# Patient Record
Sex: Male | Born: 1950
Health system: Southern US, Community
[De-identification: ages and names within clinical notes are randomized; demographics above are authoritative.]

## PROBLEM LIST (undated history)

## (undated) DIAGNOSIS — I10 Essential (primary) hypertension: Secondary | ICD-10-CM

## (undated) DIAGNOSIS — H5500 Unspecified nystagmus: Secondary | ICD-10-CM

## (undated) DIAGNOSIS — K08109 Complete loss of teeth, unspecified cause, unspecified class: Secondary | ICD-10-CM

## (undated) DIAGNOSIS — L980 Pyogenic granuloma: Secondary | ICD-10-CM

## (undated) DIAGNOSIS — M549 Dorsalgia, unspecified: Secondary | ICD-10-CM

## (undated) DIAGNOSIS — M542 Cervicalgia: Secondary | ICD-10-CM

## (undated) DIAGNOSIS — S2239XA Fracture of one rib, unspecified side, initial encounter for closed fracture: Secondary | ICD-10-CM

## (undated) DIAGNOSIS — J309 Allergic rhinitis, unspecified: Secondary | ICD-10-CM

## (undated) DIAGNOSIS — F419 Anxiety disorder, unspecified: Secondary | ICD-10-CM

## (undated) DIAGNOSIS — R339 Retention of urine, unspecified: Secondary | ICD-10-CM

## (undated) DIAGNOSIS — Z125 Encounter for screening for malignant neoplasm of prostate: Secondary | ICD-10-CM

## (undated) DIAGNOSIS — M19019 Primary osteoarthritis, unspecified shoulder: Secondary | ICD-10-CM

## (undated) DIAGNOSIS — M1990 Unspecified osteoarthritis, unspecified site: Secondary | ICD-10-CM

## (undated) DIAGNOSIS — Z803 Family history of malignant neoplasm of breast: Secondary | ICD-10-CM

## (undated) DIAGNOSIS — E039 Hypothyroidism, unspecified: Secondary | ICD-10-CM

## (undated) DIAGNOSIS — IMO0002 Reserved for concepts with insufficient information to code with codable children: Secondary | ICD-10-CM

## (undated) DIAGNOSIS — H269 Unspecified cataract: Secondary | ICD-10-CM

## (undated) DIAGNOSIS — I4949 Other premature depolarization: Secondary | ICD-10-CM

## (undated) DIAGNOSIS — N400 Enlarged prostate without lower urinary tract symptoms: Secondary | ICD-10-CM

## (undated) DIAGNOSIS — E785 Hyperlipidemia, unspecified: Secondary | ICD-10-CM

## (undated) DIAGNOSIS — L679 Hair color and hair shaft abnormality, unspecified: Secondary | ICD-10-CM

## (undated) DIAGNOSIS — R350 Frequency of micturition: Secondary | ICD-10-CM

## (undated) DIAGNOSIS — T7840XA Allergy, unspecified, initial encounter: Secondary | ICD-10-CM

## (undated) DIAGNOSIS — D485 Neoplasm of uncertain behavior of skin: Secondary | ICD-10-CM

## (undated) DIAGNOSIS — R000 Tachycardia, unspecified: Secondary | ICD-10-CM

## (undated) DIAGNOSIS — K635 Polyp of colon: Secondary | ICD-10-CM

## (undated) DIAGNOSIS — E1065 Type 1 diabetes mellitus with hyperglycemia: Secondary | ICD-10-CM

## (undated) DIAGNOSIS — Z972 Presence of dental prosthetic device (complete) (partial): Secondary | ICD-10-CM

## (undated) DIAGNOSIS — F329 Major depressive disorder, single episode, unspecified: Secondary | ICD-10-CM

## (undated) DIAGNOSIS — G4700 Insomnia, unspecified: Secondary | ICD-10-CM

## (undated) DIAGNOSIS — K219 Gastro-esophageal reflux disease without esophagitis: Secondary | ICD-10-CM

## (undated) DIAGNOSIS — J449 Chronic obstructive pulmonary disease, unspecified: Secondary | ICD-10-CM

## (undated) DIAGNOSIS — F32A Depression, unspecified: Secondary | ICD-10-CM

## (undated) DIAGNOSIS — Z973 Presence of spectacles and contact lenses: Secondary | ICD-10-CM

## (undated) DIAGNOSIS — J45909 Unspecified asthma, uncomplicated: Secondary | ICD-10-CM

## (undated) DIAGNOSIS — J302 Other seasonal allergic rhinitis: Secondary | ICD-10-CM

## (undated) DIAGNOSIS — Z79899 Other long term (current) drug therapy: Secondary | ICD-10-CM

## (undated) HISTORY — DX: Pyogenic granuloma: L98.0

## (undated) HISTORY — DX: Cervicalgia: M54.2

## (undated) HISTORY — DX: Primary osteoarthritis, unspecified shoulder: M19.019

## (undated) HISTORY — DX: Other premature depolarization: I49.49

## (undated) HISTORY — DX: Family history of malignant neoplasm of breast: Z80.3

## (undated) HISTORY — DX: Other long term (current) drug therapy: Z79.899

## (undated) HISTORY — DX: Fracture of one rib, unspecified side, initial encounter for closed fracture: S22.39XA

## (undated) HISTORY — DX: Tachycardia, unspecified: R00.0

## (undated) HISTORY — DX: Reserved for concepts with insufficient information to code with codable children: IMO0002

## (undated) HISTORY — DX: Insomnia, unspecified: G47.00

## (undated) HISTORY — DX: Gastro-esophageal reflux disease without esophagitis: K21.9

## (undated) HISTORY — DX: Encounter for screening for malignant neoplasm of prostate: Z12.5

## (undated) HISTORY — DX: Anxiety disorder, unspecified: F41.9

## (undated) HISTORY — DX: Benign prostatic hyperplasia without lower urinary tract symptoms: N40.0

## (undated) HISTORY — DX: Unspecified nystagmus: H55.00

## (undated) HISTORY — DX: Neoplasm of uncertain behavior of skin: D48.5

## (undated) HISTORY — DX: Hyperlipidemia, unspecified: E78.5

## (undated) HISTORY — DX: Frequency of micturition: R35.0

## (undated) HISTORY — DX: Allergy, unspecified, initial encounter: T78.40XA

## (undated) HISTORY — DX: Allergic rhinitis, unspecified: J30.9

## (undated) HISTORY — PX: COLONOSCOPY: SHX174

## (undated) HISTORY — PX: OTHER SURGICAL HISTORY: SHX169

## (undated) HISTORY — DX: Retention of urine, unspecified: R33.9

## (undated) HISTORY — DX: Unspecified asthma, uncomplicated: J45.909

## (undated) HISTORY — DX: Other seasonal allergic rhinitis: J30.2

## (undated) HISTORY — DX: Hair color and hair shaft abnormality, unspecified: L67.9

## (undated) HISTORY — DX: Hypothyroidism, unspecified: E03.9

## (undated) HISTORY — DX: Essential (primary) hypertension: I10

## (undated) HISTORY — DX: Dorsalgia, unspecified: M54.9

## (undated) HISTORY — DX: Chronic obstructive pulmonary disease, unspecified: J44.9

## (undated) HISTORY — DX: Major depressive disorder, single episode, unspecified: F32.9

## (undated) HISTORY — DX: Type 1 diabetes mellitus with hyperglycemia: E10.65

## (undated) HISTORY — DX: Unspecified cataract: H26.9

## (undated) HISTORY — PX: ELBOW ARTHRODESIS: SUR51

## (undated) HISTORY — DX: Polyp of colon: K63.5

## (undated) HISTORY — DX: Depression, unspecified: F32.A

## (undated) HISTORY — DX: Unspecified osteoarthritis, unspecified site: M19.90

---

## 1998-04-17 ENCOUNTER — Encounter: Admission: RE | Admit: 1998-04-17 | Discharge: 1998-04-17 | Payer: Self-pay | Admitting: Family Medicine

## 1998-06-11 ENCOUNTER — Encounter: Admission: RE | Admit: 1998-06-11 | Discharge: 1998-06-11 | Payer: Self-pay | Admitting: Family Medicine

## 2000-05-24 ENCOUNTER — Encounter: Admission: RE | Admit: 2000-05-24 | Discharge: 2000-07-05 | Payer: Self-pay | Admitting: Family Medicine

## 2001-02-08 ENCOUNTER — Encounter: Admission: RE | Admit: 2001-02-08 | Discharge: 2001-04-06 | Payer: Self-pay | Admitting: Internal Medicine

## 2002-11-08 ENCOUNTER — Encounter: Admission: RE | Admit: 2002-11-08 | Discharge: 2002-11-08 | Payer: Self-pay | Admitting: *Deleted

## 2003-12-20 ENCOUNTER — Emergency Department (HOSPITAL_COMMUNITY): Admission: EM | Admit: 2003-12-20 | Discharge: 2003-12-20 | Payer: Self-pay | Admitting: Emergency Medicine

## 2005-06-10 ENCOUNTER — Ambulatory Visit: Payer: Self-pay

## 2005-09-07 HISTORY — PX: OTHER SURGICAL HISTORY: SHX169

## 2005-09-07 HISTORY — PX: CERVICAL FUSION: SHX112

## 2005-09-11 ENCOUNTER — Encounter: Admission: RE | Admit: 2005-09-11 | Discharge: 2005-09-11 | Payer: Self-pay | Admitting: Neurological Surgery

## 2005-11-06 ENCOUNTER — Ambulatory Visit (HOSPITAL_COMMUNITY): Admission: RE | Admit: 2005-11-06 | Discharge: 2005-11-07 | Payer: Self-pay | Admitting: Neurological Surgery

## 2005-12-01 ENCOUNTER — Encounter: Admission: RE | Admit: 2005-12-01 | Discharge: 2005-12-01 | Payer: Self-pay | Admitting: Neurological Surgery

## 2006-01-05 ENCOUNTER — Encounter: Admission: RE | Admit: 2006-01-05 | Discharge: 2006-01-05 | Payer: Self-pay | Admitting: Neurological Surgery

## 2006-01-15 ENCOUNTER — Ambulatory Visit (HOSPITAL_BASED_OUTPATIENT_CLINIC_OR_DEPARTMENT_OTHER): Admission: RE | Admit: 2006-01-15 | Discharge: 2006-01-15 | Payer: Self-pay | Admitting: Orthopedic Surgery

## 2006-03-05 ENCOUNTER — Ambulatory Visit (HOSPITAL_BASED_OUTPATIENT_CLINIC_OR_DEPARTMENT_OTHER): Admission: RE | Admit: 2006-03-05 | Discharge: 2006-03-05 | Payer: Self-pay | Admitting: Orthopedic Surgery

## 2006-05-17 ENCOUNTER — Encounter: Admission: RE | Admit: 2006-05-17 | Discharge: 2006-05-17 | Payer: Self-pay | Admitting: Neurological Surgery

## 2011-09-30 ENCOUNTER — Other Ambulatory Visit: Payer: Self-pay | Admitting: Internal Medicine

## 2011-09-30 ENCOUNTER — Ambulatory Visit
Admission: RE | Admit: 2011-09-30 | Discharge: 2011-09-30 | Disposition: A | Discharge status: Home / Self Care | Payer: 59 | Source: Ambulatory Visit | Attending: Internal Medicine | Admitting: Internal Medicine

## 2011-09-30 DIAGNOSIS — M549 Dorsalgia, unspecified: Secondary | ICD-10-CM

## 2011-10-28 ENCOUNTER — Ambulatory Visit (AMBULATORY_SURGERY_CENTER): Payer: 59 | Admitting: *Deleted

## 2011-10-28 ENCOUNTER — Encounter: Payer: Self-pay | Admitting: Gastroenterology

## 2011-10-28 VITALS — Ht 67.0 in | Wt 171.4 lb

## 2011-10-28 DIAGNOSIS — Z1211 Encounter for screening for malignant neoplasm of colon: Secondary | ICD-10-CM

## 2011-10-28 MED ORDER — MOVIPREP 100 G PO SOLR: ORAL | Status: DC

## 2011-11-11 ENCOUNTER — Encounter: Payer: Self-pay | Admitting: Gastroenterology

## 2011-11-11 ENCOUNTER — Ambulatory Visit (AMBULATORY_SURGERY_CENTER): Payer: 59 | Admitting: Gastroenterology

## 2011-11-11 VITALS — BP 129/74 | HR 68 | Temp 96.5°F | Resp 18 | Ht 67.0 in | Wt 171.0 lb

## 2011-11-11 DIAGNOSIS — Z1211 Encounter for screening for malignant neoplasm of colon: Secondary | ICD-10-CM

## 2011-11-11 DIAGNOSIS — D126 Benign neoplasm of colon, unspecified: Secondary | ICD-10-CM

## 2011-11-11 LAB — GLUCOSE, CAPILLARY
Glucose-Capillary: 130 mg/dL — ABNORMAL HIGH (ref 70–99)
Glucose-Capillary: 123 mg/dL — ABNORMAL HIGH (ref 70–99)

## 2011-11-11 MED ORDER — SODIUM CHLORIDE 0.9 % IV SOLN: 500.0000 mL | INTRAVENOUS | Status: DC

## 2011-11-11 NOTE — Op Note (Signed)
[facility] [address] [place], [place]  [address]  COLONOSCOPY PROCEDURE REPORT  PATIENT:  Matthew Dominguez, Matthew Dominguez  MR#:  [id number] BIRTHDATE:  May 11, 1951, 60 yrs. old  GENDER:  male ENDOSCOPIST:  Rachael Fee, MD REF. BY:  Bufford Spikes, M.D. PROCEDURE DATE:  11/11/2011 PROCEDURE:  Colonoscopy with snare polypectomy ASA CLASS:  Class II INDICATIONS:  Routine Risk Screening MEDICATIONS:   Fentanyl 100 mcg IV, These medications were titrated to patient response per physician's verbal order, Versed 8 mg IV  DESCRIPTION OF PROCEDURE:   After the risks benefits and alternatives of the procedure were thoroughly explained, informed consent was obtained.  Digital rectal exam was performed and revealed no rectal masses.   The LB CF-[id number] endoscope was introduced through the anus and advanced to the cecum, which was identified by both the appendix and ileocecal valve, without limitations.  The quality of the prep was good..  The instrument was then slowly withdrawn as the colon was fully examined. <<PROCEDUREIMAGES>> FINDINGS:  Ten sessile polyps were found, removed and all were sent to pathology (jar 1). The largest was 1cm across, located in cecum, removed with snare/cautery. The rest were 2-29mm across, located in cecum, ascending, transverse, descending and sigmoid segments, all were removed with cold snare (see image3 and image2).  This was otherwise a normal examination of the colon (see image5, image4, and image1).   Retroflexed views in the rectum revealed no abnormalities. COMPLICATIONS:  None  ENDOSCOPIC IMPRESSION: 1) Ten polyps, all were removed and all were sent to pathology 2) Otherwise normal examination  RECOMMENDATIONS: 1) If the polyp(s) removed today are proven to be adenomatous (pre-cancerous) polyps, you will need a colonoscopy in 3 years. Otherwise you should continue to follow colorectal cancer screening guidelines for "routine risk" patients  with a colonoscopy in 10 years. You will receive a letter within 1-2 weeks with the results of your biopsy as well as final recommendations. Please call my office if you have not received a letter after 3 weeks.  ______________________________ Rachael Fee, MD  n. eSIGNED:   Rachael Fee at 11/11/2011 09:15 AM  Posey Rea, [id number]

## 2011-11-11 NOTE — Patient Instructions (Signed)
YOU HAD AN ENDOSCOPIC PROCEDURE TODAY AT THE [facility]: Refer to the procedure report that was given to you for any specific questions about what was found during the examination.  If the procedure report does not answer your questions, please call your gastroenterologist to clarify.  If you requested that your care partner not be given the details of your procedure findings, then the procedure report has been included in a sealed envelope for you to review at your convenience later.  YOU SHOULD EXPECT: Some feelings of bloating in the abdomen. Passage of more gas than usual.  Walking can help get rid of the air that was put into your GI tract during the procedure and reduce the bloating. If you had a lower endoscopy (such as a colonoscopy or flexible sigmoidoscopy) you may notice spotting of blood in your stool or on the toilet paper. If you underwent a bowel prep for your procedure, then you may not have a normal bowel movement for a few days.  DIET: Your first meal following the procedure should be a light meal and then it is ok to progress to your normal diet.  A half-sandwich or bowl of soup is an example of a good first meal.  Heavy or fried foods are harder to digest and may make you feel nauseous or bloated.  Likewise meals heavy in dairy and vegetables can cause extra gas to form and this can also increase the bloating.  Drink plenty of fluids but you should avoid alcoholic beverages for 24 hours.  ACTIVITY: Your care partner should take you home directly after the procedure.  You should plan to take it easy, moving slowly for the rest of the day.  You can resume normal activity the day after the procedure however you should NOT DRIVE or use heavy machinery for 24 hours (because of the sedation medicines used during the test).    SYMPTOMS TO REPORT IMMEDIATELY: A gastroenterologist can be reached at any hour.  During normal business hours, 8:30 AM to 5:00 PM Monday through Friday,  call [phone].  After hours and on weekends, please call the GI answering service at [phone] who will take a message and have the physician on call contact you.   Following lower endoscopy (colonoscopy or flexible sigmoidoscopy):  Excessive amounts of blood in the stool  Significant tenderness or worsening of abdominal pains  Swelling of the abdomen that is new, acute  Fever of 100F or higher    FOLLOW UP: If any biopsies were taken you will be contacted by phone or by letter within the next 1-3 weeks.  Call your gastroenterologist if you have not heard about the biopsies in 3 weeks.  Our staff will call the home number listed on your records the next business day following your procedure to check on you and address any questions or concerns that you may have at that time regarding the information given to you following your procedure. This is a courtesy call and so if there is no answer at the home number and we have not heard from you through the emergency physician on call, we will assume that you have returned to your regular daily activities without incident.  SIGNATURES/CONFIDENTIALITY: You and/or your care partner have signed paperwork which will be entered into your electronic medical record.  These signatures attest to the fact that that the information above on your After Visit Summary has been reviewed and is understood.  Full responsibility of the confidentiality   of this discharge information lies with you and/or your care-partner.     

## 2011-11-11 NOTE — Progress Notes (Signed)
Patient did not experience any of the following events: a burn prior to discharge; a fall within the facility; wrong site/side/patient/procedure/implant event; or a hospital transfer or hospital admission upon discharge from the facility. (G8907) Patient did not have preoperative order for IV antibiotic SSI prophylaxis. (G8918)  

## 2011-11-12 ENCOUNTER — Telehealth: Payer: Self-pay | Admitting: *Deleted

## 2011-11-12 NOTE — Telephone Encounter (Signed)
  Follow up Call-  Call back number 11/11/2011  Post procedure Call Back phone  # [phone]  Permission to leave phone message Yes     Patient questions:  Do you have a fever, pain , or abdominal swelling? no Pain Score  0 *  Have you tolerated food without any problems? yes  Have you been able to return to your normal activities? yes  Do you have any questions about your discharge instructions: Diet   no Medications  no Follow up visit  no  Do you have questions or concerns about your Care? no  Actions: * If pain score is 4 or above: No action needed, pain <4.

## 2011-11-17 ENCOUNTER — Encounter: Payer: Self-pay | Admitting: Gastroenterology

## 2012-10-05 ENCOUNTER — Ambulatory Visit
Admission: RE | Admit: 2012-10-05 | Discharge: 2012-10-05 | Disposition: A | Discharge status: Home / Self Care | Payer: 59 | Source: Ambulatory Visit | Attending: Nurse Practitioner | Admitting: Nurse Practitioner

## 2012-10-05 ENCOUNTER — Other Ambulatory Visit: Payer: Self-pay | Admitting: Nurse Practitioner

## 2012-10-05 DIAGNOSIS — R000 Tachycardia, unspecified: Secondary | ICD-10-CM

## 2012-11-16 ENCOUNTER — Other Ambulatory Visit: Payer: Self-pay | Admitting: Orthopedic Surgery

## 2012-11-16 ENCOUNTER — Encounter (HOSPITAL_BASED_OUTPATIENT_CLINIC_OR_DEPARTMENT_OTHER): Payer: Self-pay | Admitting: *Deleted

## 2012-11-16 NOTE — Progress Notes (Signed)
Pt goes to [facility]-had bronchitis 1/14-put on advair-labs and cxr done will call office for notes-told him to take his meds as rx-bring all meds and inhalers and overnight bag

## 2012-11-22 ENCOUNTER — Encounter (HOSPITAL_BASED_OUTPATIENT_CLINIC_OR_DEPARTMENT_OTHER): Payer: Self-pay | Admitting: Certified Registered Nurse Anesthetist

## 2012-11-22 ENCOUNTER — Ambulatory Visit (HOSPITAL_BASED_OUTPATIENT_CLINIC_OR_DEPARTMENT_OTHER): Payer: 59 | Admitting: Certified Registered Nurse Anesthetist

## 2012-11-22 ENCOUNTER — Encounter (HOSPITAL_BASED_OUTPATIENT_CLINIC_OR_DEPARTMENT_OTHER)
Admission: RE | Disposition: A | Discharge status: Home / Self Care | Payer: Self-pay | Source: Ambulatory Visit | Attending: Orthopedic Surgery

## 2012-11-22 ENCOUNTER — Encounter (HOSPITAL_BASED_OUTPATIENT_CLINIC_OR_DEPARTMENT_OTHER): Payer: Self-pay | Admitting: *Deleted

## 2012-11-22 ENCOUNTER — Ambulatory Visit (HOSPITAL_BASED_OUTPATIENT_CLINIC_OR_DEPARTMENT_OTHER)
Admission: RE | Admit: 2012-11-22 | Discharge: 2012-11-22 | Disposition: A | Discharge status: Home / Self Care | Payer: 59 | Source: Ambulatory Visit | Attending: Orthopedic Surgery | Admitting: Orthopedic Surgery

## 2012-11-22 DIAGNOSIS — J449 Chronic obstructive pulmonary disease, unspecified: Secondary | ICD-10-CM | POA: Insufficient documentation

## 2012-11-22 DIAGNOSIS — J4489 Other specified chronic obstructive pulmonary disease: Secondary | ICD-10-CM | POA: Insufficient documentation

## 2012-11-22 DIAGNOSIS — I10 Essential (primary) hypertension: Secondary | ICD-10-CM | POA: Insufficient documentation

## 2012-11-22 DIAGNOSIS — Z9889 Other specified postprocedural states: Secondary | ICD-10-CM

## 2012-11-22 DIAGNOSIS — Z7982 Long term (current) use of aspirin: Secondary | ICD-10-CM | POA: Insufficient documentation

## 2012-11-22 DIAGNOSIS — M719 Bursopathy, unspecified: Secondary | ICD-10-CM | POA: Insufficient documentation

## 2012-11-22 DIAGNOSIS — M19019 Primary osteoarthritis, unspecified shoulder: Secondary | ICD-10-CM | POA: Insufficient documentation

## 2012-11-22 DIAGNOSIS — E119 Type 2 diabetes mellitus without complications: Secondary | ICD-10-CM | POA: Insufficient documentation

## 2012-11-22 DIAGNOSIS — M67919 Unspecified disorder of synovium and tendon, unspecified shoulder: Secondary | ICD-10-CM | POA: Insufficient documentation

## 2012-11-22 DIAGNOSIS — Z79899 Other long term (current) drug therapy: Secondary | ICD-10-CM | POA: Insufficient documentation

## 2012-11-22 DIAGNOSIS — M25819 Other specified joint disorders, unspecified shoulder: Secondary | ICD-10-CM | POA: Insufficient documentation

## 2012-11-22 HISTORY — PX: SHOULDER ARTHROSCOPY WITH ROTATOR CUFF REPAIR AND SUBACROMIAL DECOMPRESSION: SHX5686

## 2012-11-22 HISTORY — DX: Complete loss of teeth, unspecified cause, unspecified class: Z97.2

## 2012-11-22 HISTORY — DX: Complete loss of teeth, unspecified cause, unspecified class: K08.109

## 2012-11-22 HISTORY — DX: Presence of spectacles and contact lenses: Z97.3

## 2012-11-22 LAB — POCT I-STAT, CHEM 8
BUN: 14 mg/dL (ref 6–23)
Creatinine, Ser: 0.8 mg/dL (ref 0.50–1.35)
Glucose, Bld: 121 mg/dL — ABNORMAL HIGH (ref 70–99)
Hemoglobin: 16.3 g/dL (ref 13.0–17.0)
TCO2: 28 mmol/L (ref 0–100)

## 2012-11-22 LAB — GLUCOSE, CAPILLARY: Glucose-Capillary: 100 mg/dL — ABNORMAL HIGH (ref 70–99)

## 2012-11-22 SURGERY — SHOULDER ARTHROSCOPY WITH ROTATOR CUFF REPAIR AND SUBACROMIAL DECOMPRESSION
Anesthesia: Regional | Site: Shoulder | Laterality: Right | Wound class: Clean

## 2012-11-22 MED ORDER — ONDANSETRON HCL 4 MG/2ML IJ SOLN
INTRAMUSCULAR | Status: DC | PRN
  Administered 2012-11-22: 4 mg via INTRAVENOUS

## 2012-11-22 MED ORDER — PROPOFOL 10 MG/ML IV BOLUS
INTRAVENOUS | Status: DC | PRN
  Administered 2012-11-22: 100 mg via INTRAVENOUS
  Administered 2012-11-22: 10 mg via INTRAVENOUS

## 2012-11-22 MED ORDER — LACTATED RINGERS IV SOLN
INTRAVENOUS | Status: DC
  Administered 2012-11-22 (×2): via INTRAVENOUS

## 2012-11-22 MED ORDER — SODIUM CHLORIDE 0.9 % IR SOLN
Status: DC | PRN
  Administered 2012-11-22: 6200 mL

## 2012-11-22 MED ORDER — BUPIVACAINE HCL (PF) 0.25 % IJ SOLN
INTRAMUSCULAR | Status: DC | PRN
  Administered 2012-11-22: 30 mL

## 2012-11-22 MED ORDER — SUCCINYLCHOLINE CHLORIDE 20 MG/ML IJ SOLN
INTRAMUSCULAR | Status: DC | PRN
  Administered 2012-11-22: 100 mg via INTRAVENOUS

## 2012-11-22 MED ORDER — OXYCODONE HCL 5 MG/5ML PO SOLN: 5.0000 mg | Freq: Once | ORAL | Status: DC | PRN

## 2012-11-22 MED ORDER — FENTANYL CITRATE 0.05 MG/ML IJ SOLN
50.0000 ug | INTRAMUSCULAR | Status: DC | PRN
  Administered 2012-11-22: 50 ug via INTRAVENOUS

## 2012-11-22 MED ORDER — EPHEDRINE SULFATE 50 MG/ML IJ SOLN
INTRAMUSCULAR | Status: DC | PRN
  Administered 2012-11-22: 5 mg via INTRAVENOUS
  Administered 2012-11-22 (×2): 10 mg via INTRAVENOUS

## 2012-11-22 MED ORDER — FENTANYL CITRATE 0.05 MG/ML IJ SOLN
INTRAMUSCULAR | Status: DC | PRN
  Administered 2012-11-22: 25 ug via INTRAVENOUS

## 2012-11-22 MED ORDER — OXYCODONE HCL 5 MG PO TABS: 5.0000 mg | ORAL_TABLET | Freq: Once | ORAL | Status: DC | PRN

## 2012-11-22 MED ORDER — CEFAZOLIN SODIUM-DEXTROSE 2-3 GM-% IV SOLR
INTRAVENOUS | Status: DC | PRN
  Administered 2012-11-22: 2 g via INTRAVENOUS

## 2012-11-22 MED ORDER — DEXAMETHASONE SODIUM PHOSPHATE 4 MG/ML IJ SOLN
INTRAMUSCULAR | Status: DC | PRN
  Administered 2012-11-22: 5 mg via INTRAVENOUS

## 2012-11-22 MED ORDER — HYDROMORPHONE HCL PF 1 MG/ML IJ SOLN: 0.2500 mg | INTRAMUSCULAR | Status: DC | PRN

## 2012-11-22 MED ORDER — LIDOCAINE HCL (CARDIAC) 20 MG/ML IV SOLN
INTRAVENOUS | Status: DC | PRN
  Administered 2012-11-22: 30 mg via INTRAVENOUS

## 2012-11-22 MED ORDER — MIDAZOLAM HCL 2 MG/2ML IJ SOLN
1.0000 mg | INTRAMUSCULAR | Status: DC | PRN
  Administered 2012-11-22: 1 mg via INTRAVENOUS

## 2012-11-22 MED ORDER — OXYCODONE-ACETAMINOPHEN 5-325 MG PO TABS: 1.0000 | ORAL_TABLET | ORAL | Status: DC | PRN

## 2012-11-22 SURGICAL SUPPLY — 81 items
ANCHOR CORKSCREW FIBER 5.5X15 (Anchor) ×2 IMPLANT
BENZOIN TINCTURE PRP APPL 2/3 (GAUZE/BANDAGES/DRESSINGS) IMPLANT
BLADE SURG 15 STRL LF DISP TIS (BLADE) IMPLANT
BLADE SURG 15 STRL SS (BLADE)
BLADE SURG ROTATE 9660 (MISCELLANEOUS) IMPLANT
BUR OVAL 4.0 (BURR) ×2 IMPLANT
CANISTER OMNI JUG 16 LITER (MISCELLANEOUS) ×2 IMPLANT
CANISTER SUCTION 2500CC (MISCELLANEOUS) IMPLANT
CANNULA 5.75X71 LONG (CANNULA) ×2 IMPLANT
CANNULA TWIST IN 8.25X7CM (CANNULA) IMPLANT
CHLORAPREP W/TINT 26ML (MISCELLANEOUS) ×2 IMPLANT
CLOTH BEACON ORANGE TIMEOUT ST (SAFETY) ×2 IMPLANT
DECANTER SPIKE VIAL GLASS SM (MISCELLANEOUS) IMPLANT
DERMABOND ADVANCED (GAUZE/BANDAGES/DRESSINGS)
DERMABOND ADVANCED .7 DNX12 (GAUZE/BANDAGES/DRESSINGS) IMPLANT
DRAPE INCISE IOBAN 66X45 STRL (DRAPES) ×2 IMPLANT
DRAPE STERI 35X30 U-POUCH (DRAPES) ×2 IMPLANT
DRAPE SURG 17X23 STRL (DRAPES) ×2 IMPLANT
DRAPE U 20/CS (DRAPES) ×2 IMPLANT
DRAPE U-SHAPE 47X51 STRL (DRAPES) ×2 IMPLANT
DRAPE U-SHAPE 76X120 STRL (DRAPES) ×4 IMPLANT
DRSG PAD ABDOMINAL 8X10 ST (GAUZE/BANDAGES/DRESSINGS) ×2 IMPLANT
ELECT REM PT RETURN 9FT ADLT (ELECTROSURGICAL)
ELECTRODE REM PT RTRN 9FT ADLT (ELECTROSURGICAL) IMPLANT
GAUZE SPONGE 4X4 16PLY XRAY LF (GAUZE/BANDAGES/DRESSINGS) IMPLANT
GAUZE XEROFORM 1X8 LF (GAUZE/BANDAGES/DRESSINGS) ×2 IMPLANT
GLOVE BIO SURGEON STRL SZ 6.5 (GLOVE) ×6 IMPLANT
GLOVE BIO SURGEON STRL SZ7 (GLOVE) ×2 IMPLANT
GLOVE BIO SURGEON STRL SZ7.5 (GLOVE) ×2 IMPLANT
GLOVE BIOGEL PI IND STRL 7.0 (GLOVE) ×4 IMPLANT
GLOVE BIOGEL PI IND STRL 8 (GLOVE) ×1 IMPLANT
GLOVE BIOGEL PI INDICATOR 7.0 (GLOVE) ×4
GLOVE BIOGEL PI INDICATOR 8 (GLOVE) ×1
GLOVE EXAM NITRILE EXT CUFF MD (GLOVE) ×2 IMPLANT
GOWN BRE IMP PREV XXLGXLNG (GOWN DISPOSABLE) ×2 IMPLANT
GOWN PREVENTION PLUS XLARGE (GOWN DISPOSABLE) ×8 IMPLANT
IMMOBILIZER SHOULDER XLGE (ORTHOPEDIC SUPPLIES) ×2 IMPLANT
IV NS IRRIG 3000ML ARTHROMATIC (IV SOLUTION) ×4 IMPLANT
NDL SUT 6 .5 CRC .975X.05 MAYO (NEEDLE) IMPLANT
NEEDLE 1/2 CIR CATGUT .05X1.09 (NEEDLE) IMPLANT
NEEDLE MAYO TAPER (NEEDLE)
NEEDLE SCORPION MULTI FIRE (NEEDLE) ×2 IMPLANT
NS IRRIG 1000ML POUR BTL (IV SOLUTION) IMPLANT
PACK ARTHROSCOPY DSU (CUSTOM PROCEDURE TRAY) ×2 IMPLANT
PACK BASIN DAY SURGERY FS (CUSTOM PROCEDURE TRAY) ×2 IMPLANT
PENCIL BUTTON HOLSTER BLD 10FT (ELECTRODE) IMPLANT
PUSHLOCK PEEK 4.5X24 (Orthopedic Implant) ×2 IMPLANT
RESECTOR FULL RADIUS 4.2MM (BLADE) ×2 IMPLANT
SLEEVE SCD COMPRESS KNEE MED (MISCELLANEOUS) ×2 IMPLANT
SLING ARM FOAM STRAP LRG (SOFTGOODS) IMPLANT
SLING ARM FOAM STRAP MED (SOFTGOODS) IMPLANT
SLING ARM FOAM STRAP XLG (SOFTGOODS) IMPLANT
SLING ARM IMMOBILIZER MED (SOFTGOODS) IMPLANT
SPONGE GAUZE 4X4 12PLY (GAUZE/BANDAGES/DRESSINGS) ×2 IMPLANT
SPONGE LAP 4X18 X RAY DECT (DISPOSABLE) IMPLANT
STRIP CLOSURE SKIN 1/2X4 (GAUZE/BANDAGES/DRESSINGS) IMPLANT
SUCTION FRAZIER TIP 10 FR DISP (SUCTIONS) IMPLANT
SUPPORT WRAP ARM LG (MISCELLANEOUS) IMPLANT
SUT BONE WAX W31G (SUTURE) IMPLANT
SUT ETHIBOND 2 OS 4 DA (SUTURE) IMPLANT
SUT ETHILON 3 0 PS 1 (SUTURE) ×2 IMPLANT
SUT ETHILON 4 0 PS 2 18 (SUTURE) IMPLANT
SUT FIBERWIRE #2 38 T-5 BLUE (SUTURE)
SUT FIBERWIRE 2-0 18 17.9 3/8 (SUTURE)
SUT MNCRL AB 3-0 PS2 18 (SUTURE) IMPLANT
SUT MNCRL AB 4-0 PS2 18 (SUTURE) IMPLANT
SUT PDS AB 0 CT 36 (SUTURE) IMPLANT
SUT PROLENE 3 0 PS 2 (SUTURE) IMPLANT
SUT VIC AB 0 CT1 18XCR BRD 8 (SUTURE) IMPLANT
SUT VIC AB 0 CT1 8-18 (SUTURE)
SUT VIC AB 2-0 SH 18 (SUTURE) IMPLANT
SUTURE FIBERWR #2 38 T-5 BLUE (SUTURE) IMPLANT
SUTURE FIBERWR 2-0 18 17.9 3/8 (SUTURE) IMPLANT
SYR BULB 3OZ (MISCELLANEOUS) IMPLANT
TOWEL OR 17X24 6PK STRL BLUE (TOWEL DISPOSABLE) ×2 IMPLANT
TOWEL OR NON WOVEN STRL DISP B (DISPOSABLE) ×2 IMPLANT
TUBE CONNECTING 20X1/4 (TUBING) ×2 IMPLANT
TUBING ARTHROSCOPY IRRIG 16FT (MISCELLANEOUS) ×2 IMPLANT
WAND STAR VAC 90 (SURGICAL WAND) ×2 IMPLANT
WATER STERILE IRR 1000ML POUR (IV SOLUTION) ×4 IMPLANT
YANKAUER SUCT BULB TIP NO VENT (SUCTIONS) IMPLANT

## 2012-11-22 NOTE — H&P (Signed)
Matthew Dominguez is an 62 y.o. male.   Chief Complaint: R shoulder pain HPI: R shoulder pain x several years, failed conservative treatment of activity modification and injections.  Past Medical History  Diagnosis Date  . Allergy   . Seasonal allergies   . Anxiety   . Arthritis   . Asthma   . Depression   . Diabetes mellitus   . Hypertension   . COPD (chronic obstructive pulmonary disease)   . Wears glasses   . Full dentures     Past Surgical History  Procedure Laterality Date  . Totator cuff repair  2007    left  . Cervical fusion  2007  . Elbow arthrodesis      right as child  . Colonoscopy      Family History  Problem Relation Age of Onset  . Diabetes Sister   . Heart disease Brother   . Pancreatic cancer Maternal Aunt   . Diabetes Sister   . Diabetes Sister   . Breast cancer Sister   . Heart disease Brother   . Diabetes Brother   . Colon polyps Brother   . Diabetes Brother   . Diabetes Brother   . Diabetes Brother   . Diabetes Brother   . Diabetes Brother    Social History:  reports that he quit smoking about 9 years ago. His smokeless tobacco use includes Snuff. He reports that he does not drink alcohol or use illicit drugs.  Allergies: No Known Allergies  Medications Prior to Admission  Medication Sig Dispense Refill  . albuterol (PROVENTIL HFA;VENTOLIN HFA) 108 (90 BASE) MCG/ACT inhaler Inhale 2 puffs into the lungs every 6 (six) hours as needed for wheezing.      Marland Kitchen aspirin EC 81 MG tablet Take 81 mg by mouth daily.      Marland Kitchen b complex vitamins capsule Take 1 capsule by mouth daily.      . benazepril (LOTENSIN) 10 MG tablet Take 10 mg by mouth daily.      . DiphenhydrAMINE HCl (BENADRYL PO) Take 1 mg by mouth daily.      . Fluticasone-Salmeterol (ADVAIR) 100-50 MCG/DOSE AEPB Inhale 1 puff into the lungs every 12 (twelve) hours.      . gabapentin (NEURONTIN) 300 MG capsule Take 300 mg by mouth 3 (three) times daily.      . insulin glargine (LANTUS) 100  UNIT/ML injection Inject 34 Units into the skin at bedtime.       Marland Kitchen LORazepam (ATIVAN) 1 MG tablet Take 1 mg by mouth 3 (three) times daily.      . metformin (FORTAMET) 1000 MG (OSM) 24 hr tablet Take 1,000 mg by mouth 2 (two) times daily with a meal.       . oxybutynin (DITROPAN-XL) 5 MG 24 hr tablet Take 5 mg by mouth daily.      Marland Kitchen zolpidem (AMBIEN) 10 MG tablet Take 10 mg by mouth at bedtime.      Marland Kitchen loperamide (IMODIUM A-D) 2 MG tablet Take 2 mg by mouth 4 (four) times daily as needed.      Marland Kitchen MOVIPREP 100 G SOLR movi prep as directed  1 each  0  . Multiple Vitamins-Minerals (MULTIVITAMIN WITH MINERALS) tablet Take 1 tablet by mouth daily.        Results for orders placed during the hospital encounter of 11/22/12 (from the past 48 hour(s))  GLUCOSE, CAPILLARY     Status: Abnormal   Collection Time    11/22/12  9:25 AM      Result Value Range   Glucose-Capillary 108 (*) 70 - 99 mg/dL  POCT I-STAT, CHEM 8     Status: Abnormal   Collection Time    11/22/12  9:56 AM      Result Value Range   Sodium 142  135 - 145 mEq/L   Potassium 4.1  3.5 - 5.1 mEq/L   Chloride 106  96 - 112 mEq/L   BUN 14  6 - 23 mg/dL   Creatinine, Ser 4.09  0.50 - 1.35 mg/dL   Glucose, Bld 811 (*) 70 - 99 mg/dL   Calcium, Ion 9.14 (*) 1.13 - 1.30 mmol/L   TCO2 28  0 - 100 mmol/L   Hemoglobin 16.3  13.0 - 17.0 g/dL   HCT 78.2  95.6 - 21.3 %   No results found.  Review of Systems  All other systems reviewed and are negative.    Blood pressure 138/86, pulse 88, temperature 97.9 F (36.6 C), temperature source Oral, resp. rate 18, height 5\' 7"  (1.702 m), weight 78.109 kg (172 lb 3.2 oz), SpO2 97.00%. Physical Exam  Constitutional: He is oriented to person, place, and time. He appears well-developed and well-nourished.  HENT:  Head: Atraumatic.  Eyes: EOM are normal.  Cardiovascular: Intact distal pulses.   Respiratory: Effort normal.  Musculoskeletal:       Right shoulder: He exhibits tenderness, pain  and decreased strength. He exhibits no swelling.  Neurological: He is alert and oriented to person, place, and time.  Skin: Skin is warm and dry.  Psychiatric: He has a normal mood and affect.     Assessment/Plan R shoulder chronic impingement, small RCT, AC DJD Plan R arth SAD/DCR/Debridement versus repair of small RCT. Risks / benefits of surgery discussed Consent on chart  NPO for OR Preop antibiotics   Bryant Saye WILLIAM 11/22/2012, 10:06 AM

## 2012-11-22 NOTE — Transfer of Care (Signed)
Immediate Anesthesia Transfer of Care Note  Patient: Matthew Dominguez  Procedure(s) Performed: Procedure(s): RIGHT SHOULDER ARTHROSCOPY WITH ARTHROSCOPIC ROTATOR CUFF REPAIR AND SUBACROMIAL DECOMPRESSION AND DISTAL CLAVICLE RESECTION, BICEPS TENOLYSIS (Right)  Patient Location: PACU  Anesthesia Type:GA combined with regional for post-op pain  Level of Consciousness: awake, alert , oriented and patient cooperative  Airway & Oxygen Therapy: Patient Spontanous Breathing and Patient connected to face mask oxygen  Post-op Assessment: Report given to PACU RN and Post -op Vital signs reviewed and stable  Post vital signs: Reviewed and stable  Complications: No apparent anesthesia complications

## 2012-11-22 NOTE — Progress Notes (Signed)
Assisted Dr. Fitzgerald with right, ultrasound guided, interscalene  block. Side rails up, monitors on throughout procedure. See vital signs in flow sheet. Tolerated Procedure well. 

## 2012-11-22 NOTE — Anesthesia Procedure Notes (Addendum)
Anesthesia Regional Block:  Interscalene brachial plexus block  Pre-Anesthetic Checklist: ,, timeout performed, Correct Patient, Correct Site, Correct Laterality, Correct Procedure, Correct Position, site marked, Risks and benefits discussed, pre-op evaluation,  At surgeon's request and post-op pain management  Laterality: Right  Prep: Maximum Sterile Barrier Precautions used and chloraprep       Needles:  Injection technique: Single-shot  Needle Type: Echogenic Stimulator Needle     Needle Length: 5cm 5 cm Needle Gauge: 22 and 22 G    Additional Needles:  Procedures: ultrasound guided (picture in chart) and nerve stimulator Interscalene brachial plexus block  Nerve Stimulator or Paresthesia:  Response: Biceps response, 0.4 mA,   Additional Responses:   Narrative:  Start time: 11/22/2012 9:57 AM End time: 11/22/2012 10:07 AM Injection made incrementally with aspirations every 5 mL. Anesthesiologist: Sampson Goon, MD  Additional Notes: 2% Lidocaine skin wheel.   Interscalene brachial plexus block Procedure Name: Intubation Date/Time: 11/22/2012 10:26 AM Performed by: Sumaya Riedesel D Pre-anesthesia Checklist: Patient identified, Emergency Drugs available, Suction available and Patient being monitored Patient Re-evaluated:Patient Re-evaluated prior to inductionOxygen Delivery Method: Circle System Utilized Preoxygenation: Pre-oxygenation with 100% oxygen Intubation Type: IV induction Ventilation: Mask ventilation without difficulty Laryngoscope Size: Mac and 3 Grade View: Grade II Tube type: Oral Tube size: 7.0 mm Number of attempts: 1 Airway Equipment and Method: stylet and LTA kit utilized Placement Confirmation: ETT inserted through vocal cords under direct vision,  positive ETCO2 and breath sounds checked- equal and bilateral Secured at: 21 cm Tube secured with: Tape Dental Injury: Teeth and Oropharynx as per pre-operative assessment  Comments: Neck/ cervical  region neutral during intubation as well as positioning for surgery

## 2012-11-22 NOTE — Op Note (Signed)
Procedure(s): RIGHT SHOULDER ARTHROSCOPY WITH ARTHROSCOPIC ROTATOR CUFF REPAIR AND SUBACROMIAL DECOMPRESSION AND DISTAL CLAVICLE RESECTION, BICEPS TENOLYSIS Procedure Note  Matthew Dominguez male 62 y.o. 11/22/2012  Procedure(s) and Anesthesia Type:   #1 right shoulder arthroscopic rotator cuff repair #2 right shoulder arthroscopic subacromial decompression #3 right shoulder arthroscopic distal clavicle excision #4 right shoulder arthroscopic biceps tenotomy and debridement of superior and anterior labral tears  Surgeon(s) and Role:    * Mable Paris, MD - Primary     Surgeon: Mable Paris   Assistants: Damita Lack PA-C (Danielle was present and scrubbed throughout the procedure and was essential in positioning, assisting with the camera and instrumentation,, and closure)  Anesthesia: General endotracheal anesthesia with preoperative interscalene block     Procedure Detail   Estimated Blood Loss: Min         Drains: none  Blood Given: none         Specimens: none        Complications:  * No complications entered in OR log *         Disposition: PACU - hemodynamically stable.         Condition: stable    Procedure:   INDICATIONS FOR SURGERY: The patient is 62 y.o. male who has had a long history of right shoulder pain greater than 2 years which has gone on to fail conservative management with injection therapy, activity modification, physical therapy. He wished to go forward with surgical management. He had an MRI which showed a very high-grade partial-thickness bursal sided tear with unfavorable acromial anatomy and significant a.c. joint DJD.  OPERATIVE FINDINGS: Examination under anesthesia: No stiffness or instability Diagnostic Arthroscopy:  Glenoid articular cartilage: Intact Humeral head articular cartilage: Intact Labrum: He had significant tearing of the superior labrum and extending into the anterior labrum with significant  detachment of the biceps anchor. This extended into the proximal biceps tendon. Therefore an arthroscopic tenotomy of the long head biceps tendon was performed with extensive debridement of the superior and anterior labral tears. Loose bodies: None Synovitis: Moderate Articular sided rotator cuff: Moderate fraying involving less than 10% of the tendon thickness Bursal sided rotator cuff: Significant high-grade partial tear involving greater than 50% of the thickness anteriorly measuring about 1.5 cm anterior to posterior the leading edge of the supraspinatus. This was repaired using a double rotator cuff with a 5.5 mm peak corkscrew anchor medially and a 4.5 mm peak push lock anchor laterally. Coracoacromial ligament: Severely frayed indicating chronic impingement. He had a large hooked anterolateral acromial spur the very tight subacromial space. This was taken down the standard acromioplasty creating a type I acromion. He also has admitted DJD at the a.c. joint which was addressed with a distal clavicle excision removing about 8 mm the distal clavicle and a smooth even fashion.  DESCRIPTION OF PROCEDURE: The patient was identified in preoperative  holding area where I personally marked the operative site after  verifying site, side, and procedure with the patient. An interscalene block was given by the attending anesthesiologist the holding area.  The patient was taken back to the operating room where general anesthesia was induced without complication and was placed in the beach-chair position with the back  elevated about 60 degrees and all extremities and head and neck carefully padded and  positioned.   The right upper extremity was then prepped and  draped in a standard sterile fashion. The appropriate time-out  procedure was carried out. The patient did receive IV  antibiotics  within 30 minutes of incision.   A small posterior portal incision was made and the arthroscope was introduced  into the joint. An anterior portal was then established above the subscapularis using needle localization. Small cannula was placed anteriorly. Diagnostic arthroscopy was then carried out with findings as described above.  He was noted to have extensive tearing of the superior labrum extending anteriorly. This involved the biceps anchor. This was probed. It was debrided extensively. After debridement was noted that the biceps anchor was significantly involved in that the tearing extended into the long head biceps tendon. Therefore the biceps was released from the superior labrum using the ArthriCare. The remaining labrum was debrided back to healthy labrum which would not subluxate into the joint. The undersurface of the supraspinatus was carefully examined and noted to have extensive fraying and partial tearing involving less than 10% of the thickness of the tendon. This was debrided with a shaver back to more healthy tendon. There are no areas of full-thickness tear of the rotator cuff. The subscapularis was completely intact. No loose bodies were noted. The humeral cartilage was intact.  The arthroscope was then introduced into the subacromial space a standard lateral portal was established with needle localization. The shaver was used through the lateral portal to perform extensive bursectomy. Coracoacromial ligament was examined and found to be severely frayed.  An extensive bursectomy was performed. The posterior cuff was completely intact. Anteriorly he was noted to have a high-grade partial-thickness tear of the anterior leading edge supraspinatus. This measured about 1.5 cm anterior to posterior. Involving greater than 50% of the thickness of the tendon. The exposed tuberosity was debrided and a bur was used to remove a moderate sized spur on the tuberosity. The lateral edge of the acromion was noted to hang down and created a very tight space. The lateral edge was removed prior to the repair. A large  cannula was placed in a lateral portal position. Camera was moved to a posterior lateral viewing portal. The repair was then carried out using one 5.5 mm peak corkscrew anchor and the medial row with 2 horizontal mattress sutures. These were tied and then brought over to 4.5 mm peak push lock anchor in a lateral row. The tear was debrided back to healthy tendon but the medial intact rotator cuff was not taken down prior to the repair.  The coracoacromial ligament was taken down off the anterior acromion with the ArthroCare exposing a large anterior acromial spur. A high-speed bur was then used through the lateral portal to take down the anterior acromial spur from lateral to medial in a standard acromioplasty.  The acromioplasty was also viewed from the lateral portal and the bur was used as necessary to ensure that the acromion was completely flat from posterior to anterior.  The distal clavicle was exposed arthroscopically and the bur was used to take off the undersurface for approximately 8 mm from the lateral portal. The bur was then moved to an anterior portal position to complete the distal clavicle excision resecting about 8 mm of the distal clavicle and a smooth even fashion. This was viewed from anterior and lateral portals and felt to be complete.  The arthroscopic equipment was removed from the joint and the portals were closed with 3-0 nylon in an interrupted fashion. Sterile dressings were then applied including Xeroform 4 x 4's ABDs and tape. The patient was then allowed to awaken from general anesthesia, placed in a sling, transferred to the stretcher  and taken to the recovery room in stable condition.   POSTOPERATIVE PLAN: The patient will be discharged home today and will followup in one week for suture removal and wound check.  He'll follow the standard rotator cuff protocol.

## 2012-11-22 NOTE — Anesthesia Preprocedure Evaluation (Signed)
Anesthesia Evaluation  Patient identified by MRN, date of birth, ID band Patient awake    Reviewed: Allergy & Precautions, H&P , NPO status , Patient's Chart, lab work & pertinent test results  Airway Mallampati: II TM Distance: >3 FB Neck ROM: Full    Dental no notable dental hx. (+) Edentulous Upper and Edentulous Lower   Pulmonary asthma , COPD COPD inhaler,  breath sounds clear to auscultation  Pulmonary exam normal       Cardiovascular hypertension, On Medications Rhythm:Regular Rate:Normal     Neuro/Psych PSYCHIATRIC DISORDERS negative neurological ROS     GI/Hepatic negative GI ROS, Neg liver ROS,   Endo/Other  diabetes, Well Controlled, Type 1, Insulin Dependent  Renal/GU negative Renal ROS  negative genitourinary   Musculoskeletal   Abdominal   Peds  Hematology negative hematology ROS (+)   Anesthesia Other Findings   Reproductive/Obstetrics negative OB ROS                           Anesthesia Physical Anesthesia Plan  ASA: III  Anesthesia Plan: General and Regional   Post-op Pain Management:    Induction: Intravenous  Airway Management Planned: Oral ETT  Additional Equipment:   Intra-op Plan:   Post-operative Plan: Extubation in OR  Informed Consent: I have reviewed the patients History and Physical, chart, labs and discussed the procedure including the risks, benefits and alternatives for the proposed anesthesia with the patient or authorized representative who has indicated his/her understanding and acceptance.   Dental advisory given  Plan Discussed with: CRNA  Anesthesia Plan Comments:         Anesthesia Quick Evaluation

## 2012-11-22 NOTE — Anesthesia Postprocedure Evaluation (Signed)
  Anesthesia Post-op Note  Patient: Matthew Dominguez  Procedure(s) Performed: Procedure(s): RIGHT SHOULDER ARTHROSCOPY WITH ARTHROSCOPIC ROTATOR CUFF REPAIR AND SUBACROMIAL DECOMPRESSION AND DISTAL CLAVICLE RESECTION, BICEPS TENOLYSIS (Right)  Patient Location: PACU  Anesthesia Type:GA combined with regional for post-op pain  Level of Consciousness: awake, alert  and oriented  Airway and Oxygen Therapy: Patient Spontanous Breathing and Patient connected to nasal cannula oxygen  Post-op Pain: none  Post-op Assessment: Post-op Vital signs reviewed, Patient's Cardiovascular Status Stable, Respiratory Function Stable, Patent Airway and No signs of Nausea or vomiting  Post-op Vital Signs: Reviewed and stable  Complications: No apparent anesthesia complications

## 2012-11-23 ENCOUNTER — Encounter (HOSPITAL_BASED_OUTPATIENT_CLINIC_OR_DEPARTMENT_OTHER): Payer: Self-pay | Admitting: Orthopedic Surgery

## 2012-12-21 ENCOUNTER — Other Ambulatory Visit: Payer: Self-pay | Admitting: Nurse Practitioner

## 2012-12-22 ENCOUNTER — Other Ambulatory Visit: Payer: Self-pay | Admitting: Geriatric Medicine

## 2012-12-22 MED ORDER — ZOLPIDEM TARTRATE 10 MG PO TABS: 10.0000 mg | ORAL_TABLET | Freq: Every day | ORAL | Status: DC

## 2013-01-04 ENCOUNTER — Other Ambulatory Visit: Payer: Self-pay | Admitting: Internal Medicine

## 2013-01-05 ENCOUNTER — Other Ambulatory Visit: Payer: Self-pay | Admitting: *Deleted

## 2013-01-05 MED ORDER — LORAZEPAM 1 MG PO TABS: ORAL_TABLET | ORAL | Status: DC

## 2013-01-05 MED ORDER — DICLOFENAC SODIUM 1 % TD GEL: TRANSDERMAL | Status: DC

## 2013-01-10 ENCOUNTER — Other Ambulatory Visit: Payer: Self-pay | Admitting: Geriatric Medicine

## 2013-01-10 MED ORDER — LORAZEPAM 1 MG PO TABS: ORAL_TABLET | ORAL | Status: DC

## 2013-02-09 ENCOUNTER — Other Ambulatory Visit: Payer: Self-pay | Admitting: Internal Medicine

## 2013-02-20 ENCOUNTER — Other Ambulatory Visit: Payer: Self-pay | Admitting: Geriatric Medicine

## 2013-02-20 MED ORDER — BENAZEPRIL HCL 10 MG PO TABS: 10.0000 mg | ORAL_TABLET | Freq: Every day | ORAL | Status: DC

## 2013-02-21 ENCOUNTER — Other Ambulatory Visit: Payer: Self-pay | Admitting: *Deleted

## 2013-02-21 MED ORDER — GLUCOSE BLOOD VI STRP: ORAL_STRIP | Status: DC

## 2013-02-27 ENCOUNTER — Encounter: Payer: Self-pay | Admitting: *Deleted

## 2013-03-01 ENCOUNTER — Ambulatory Visit (INDEPENDENT_AMBULATORY_CARE_PROVIDER_SITE_OTHER): Payer: 59 | Admitting: Nurse Practitioner

## 2013-03-01 ENCOUNTER — Encounter: Payer: Self-pay | Admitting: Nurse Practitioner

## 2013-03-01 VITALS — BP 126/84 | HR 89 | Temp 96.7°F | Resp 14 | Ht 67.0 in | Wt 173.4 lb

## 2013-03-01 DIAGNOSIS — E785 Hyperlipidemia, unspecified: Secondary | ICD-10-CM | POA: Insufficient documentation

## 2013-03-01 DIAGNOSIS — J449 Chronic obstructive pulmonary disease, unspecified: Secondary | ICD-10-CM | POA: Insufficient documentation

## 2013-03-01 DIAGNOSIS — R000 Tachycardia, unspecified: Secondary | ICD-10-CM

## 2013-03-01 DIAGNOSIS — M25519 Pain in unspecified shoulder: Secondary | ICD-10-CM | POA: Insufficient documentation

## 2013-03-01 DIAGNOSIS — I10 Essential (primary) hypertension: Secondary | ICD-10-CM | POA: Insufficient documentation

## 2013-03-01 DIAGNOSIS — E119 Type 2 diabetes mellitus without complications: Secondary | ICD-10-CM | POA: Insufficient documentation

## 2013-03-01 DIAGNOSIS — M25511 Pain in right shoulder: Secondary | ICD-10-CM

## 2013-03-01 DIAGNOSIS — N400 Enlarged prostate without lower urinary tract symptoms: Secondary | ICD-10-CM | POA: Insufficient documentation

## 2013-03-01 MED ORDER — TRAMADOL HCL 50 MG PO TABS: 50.0000 mg | ORAL_TABLET | Freq: Four times a day (QID) | ORAL | Status: DC | PRN

## 2013-03-01 MED ORDER — METOPROLOL TARTRATE 12.5 MG HALF TABLET: 12.5000 mg | ORAL_TABLET | Freq: Two times a day (BID) | ORAL | Status: DC

## 2013-03-01 MED ORDER — METOPROLOL TARTRATE 25 MG PO TABS: 25.0000 mg | ORAL_TABLET | Freq: Two times a day (BID) | ORAL | Status: DC

## 2013-03-01 MED ORDER — INSULIN GLARGINE 100 UNIT/ML ~~LOC~~ SOLN: 45.0000 [IU] | Freq: Every day | SUBCUTANEOUS | Status: DC
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
## 2013-03-01 NOTE — Patient Instructions (Addendum)
Start taking metoprolol 1/2 tablet by mouth daily for high heart rate  To follow up in 2 weeks regarding blood pressure and heart rate    Will also schedule follow up for 6 month with blood work before visit

## 2013-03-02 LAB — COMPREHENSIVE METABOLIC PANEL
ALT: 26 IU/L (ref 0–44)
Albumin: 5.2 g/dL — ABNORMAL HIGH (ref 3.6–4.8)
Alkaline Phosphatase: 58 IU/L (ref 39–117)
BUN: 10 mg/dL (ref 8–27)
CO2: 24 mmol/L (ref 18–29)
Chloride: 96 mmol/L — ABNORMAL LOW (ref 97–108)
Glucose: 236 mg/dL — ABNORMAL HIGH (ref 65–99)
Potassium: 5 mmol/L (ref 3.5–5.2)
Total Protein: 7 g/dL (ref 6.0–8.5)

## 2013-03-02 LAB — CBC WITH DIFFERENTIAL
Basophils Absolute: 0.1 10*3/uL (ref 0.0–0.2)
Eos: 1 % (ref 0–5)
HCT: 48.6 % (ref 37.5–51.0)
Immature Granulocytes: 0 % (ref 0–2)
Lymphocytes Absolute: 2.1 10*3/uL (ref 0.7–3.1)
Lymphs: 24 % (ref 14–46)
MCV: 91 fL (ref 79–97)
Monocytes: 5 % (ref 4–12)
Platelets: 288 10*3/uL (ref 150–379)
RBC: 5.37 x10E6/uL (ref 4.14–5.80)
RDW: 13.4 % (ref 12.3–15.4)
WBC: 8.7 10*3/uL (ref 3.4–10.8)

## 2013-03-02 LAB — HEMOGLOBIN A1C
Est. average glucose Bld gHb Est-mCnc: 146 mg/dL
Hgb A1c MFr Bld: 6.7 % — ABNORMAL HIGH (ref 4.8–5.6)

## 2013-03-15 ENCOUNTER — Telehealth: Payer: Self-pay | Admitting: Geriatric Medicine

## 2013-03-15 NOTE — Telephone Encounter (Signed)
He can ask if levemir is cheaper with his insurance.

## 2013-03-15 NOTE — Telephone Encounter (Signed)
Patient takes Lantus and has recently switched insurance companies. He can no longer afford it under his new plan. Is there a cheaper alternative? Please advise, thanks.

## 2013-03-16 NOTE — Telephone Encounter (Signed)
Patient actually does not have any insurance at all. He just needs a cheap insulin. Please advise, thanks.

## 2013-03-20 ENCOUNTER — Ambulatory Visit: Payer: 59 | Admitting: Nurse Practitioner

## 2013-04-19 ENCOUNTER — Other Ambulatory Visit: Payer: Self-pay | Admitting: Internal Medicine

## 2013-04-25 NOTE — Telephone Encounter (Signed)
Patient aware.

## 2013-05-19 ENCOUNTER — Other Ambulatory Visit: Payer: Self-pay | Admitting: Internal Medicine

## 2013-06-06 ENCOUNTER — Telehealth: Payer: Self-pay

## 2013-06-06 NOTE — Telephone Encounter (Signed)
Per note received via (fax/mail) patient is non-adherent with several of his medications. Per physician patient needs to have a follow-up appointment. I contacted the patient and patient indicted he is out of work and no longer has insurance. Patient is unable to schedule appointment at this time.  Message will be forwarded to PCP as a [place]

## 2013-06-07 NOTE — Telephone Encounter (Signed)
Let's see if we can provide him with some samples of meds he takes or similar medications.  We may have some advair or symbicort in the closet which are interchangeable, lantus, and possibly an ace inhibitor (probably different from what he takes though).  If you take a look, I can figure out some interchanges for him tomorrow.

## 2013-06-15 ENCOUNTER — Ambulatory Visit (INDEPENDENT_AMBULATORY_CARE_PROVIDER_SITE_OTHER): Payer: Self-pay | Admitting: Nurse Practitioner

## 2013-06-15 ENCOUNTER — Encounter: Payer: Self-pay | Admitting: Nurse Practitioner

## 2013-06-15 VITALS — BP 144/82 | HR 90 | Temp 97.9°F | Resp 14 | Ht 67.0 in | Wt 176.4 lb

## 2013-06-15 DIAGNOSIS — I10 Essential (primary) hypertension: Secondary | ICD-10-CM

## 2013-06-15 DIAGNOSIS — G4700 Insomnia, unspecified: Secondary | ICD-10-CM | POA: Insufficient documentation

## 2013-06-15 DIAGNOSIS — M25511 Pain in right shoulder: Secondary | ICD-10-CM

## 2013-06-15 DIAGNOSIS — M25519 Pain in unspecified shoulder: Secondary | ICD-10-CM

## 2013-06-15 DIAGNOSIS — J449 Chronic obstructive pulmonary disease, unspecified: Secondary | ICD-10-CM

## 2013-06-15 DIAGNOSIS — N400 Enlarged prostate without lower urinary tract symptoms: Secondary | ICD-10-CM

## 2013-06-15 MED ORDER — FINASTERIDE 5 MG PO TABS: 5.0000 mg | ORAL_TABLET | Freq: Every day | ORAL | Status: DC

## 2013-06-15 MED ORDER — TRAZODONE HCL 50 MG PO TABS: 25.0000 mg | ORAL_TABLET | Freq: Every evening | ORAL | Status: DC | PRN
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
## 2013-06-15 NOTE — Telephone Encounter (Signed)
Patient seen in office on 06-15-2013 by Candelaria Celeste, NP

## 2013-06-15 NOTE — Patient Instructions (Signed)
Will stop ambien (due to cost) start trazodone 50 mg tablet may take 1/2-1 tablet nightly as needed for sleep  Will stop oxybutynin (not working) and Flomax (due to cost) start finasteride 5 mg for prostate  Will give samples of lantus and advair

## 2013-06-16 ENCOUNTER — Other Ambulatory Visit: Payer: Self-pay | Admitting: Internal Medicine

## 2013-07-12 ENCOUNTER — Other Ambulatory Visit: Payer: Self-pay | Admitting: Nurse Practitioner

## 2013-08-13 ENCOUNTER — Other Ambulatory Visit: Payer: Self-pay | Admitting: Nurse Practitioner

## 2013-09-06 ENCOUNTER — Other Ambulatory Visit: Payer: Self-pay | Admitting: *Deleted

## 2013-09-06 DIAGNOSIS — M25511 Pain in right shoulder: Secondary | ICD-10-CM

## 2013-09-06 MED ORDER — TRAMADOL HCL 50 MG PO TABS: 50.0000 mg | ORAL_TABLET | Freq: Four times a day (QID) | ORAL | Status: DC | PRN

## 2013-09-11 ENCOUNTER — Other Ambulatory Visit: Payer: Self-pay | Admitting: Nurse Practitioner

## 2013-09-29 ENCOUNTER — Other Ambulatory Visit: Payer: Self-pay | Admitting: Internal Medicine

## 2013-11-17 ENCOUNTER — Other Ambulatory Visit: Payer: Self-pay | Admitting: *Deleted

## 2013-11-17 MED ORDER — LORAZEPAM 1 MG PO TABS: ORAL_TABLET | ORAL | Status: DC

## 2013-11-17 NOTE — Telephone Encounter (Signed)
Patient Requested 

## 2013-12-01 ENCOUNTER — Other Ambulatory Visit: Payer: Self-pay | Admitting: Nurse Practitioner

## 2014-01-11 ENCOUNTER — Ambulatory Visit (INDEPENDENT_AMBULATORY_CARE_PROVIDER_SITE_OTHER): Payer: Commercial Managed Care - HMO | Admitting: Internal Medicine

## 2014-01-11 ENCOUNTER — Encounter: Payer: Self-pay | Admitting: Internal Medicine

## 2014-01-11 VITALS — BP 128/80 | HR 100 | Temp 98.7°F | Ht 67.5 in | Wt 181.0 lb

## 2014-01-11 DIAGNOSIS — IMO0001 Reserved for inherently not codable concepts without codable children: Secondary | ICD-10-CM

## 2014-01-11 DIAGNOSIS — J449 Chronic obstructive pulmonary disease, unspecified: Secondary | ICD-10-CM

## 2014-01-11 DIAGNOSIS — M67919 Unspecified disorder of synovium and tendon, unspecified shoulder: Secondary | ICD-10-CM

## 2014-01-11 DIAGNOSIS — M7581 Other shoulder lesions, right shoulder: Secondary | ICD-10-CM | POA: Insufficient documentation

## 2014-01-11 DIAGNOSIS — N400 Enlarged prostate without lower urinary tract symptoms: Secondary | ICD-10-CM

## 2014-01-11 DIAGNOSIS — J4489 Other specified chronic obstructive pulmonary disease: Secondary | ICD-10-CM

## 2014-01-11 DIAGNOSIS — E1165 Type 2 diabetes mellitus with hyperglycemia: Principal | ICD-10-CM

## 2014-01-11 DIAGNOSIS — M719 Bursopathy, unspecified: Secondary | ICD-10-CM

## 2014-01-11 DIAGNOSIS — G4700 Insomnia, unspecified: Secondary | ICD-10-CM

## 2014-01-11 DIAGNOSIS — I10 Essential (primary) hypertension: Secondary | ICD-10-CM

## 2014-01-11 MED ORDER — TAMSULOSIN HCL 0.4 MG PO CAPS: 0.4000 mg | ORAL_CAPSULE | Freq: Every day | ORAL | Status: DC

## 2014-01-11 MED ORDER — GLUCOSE BLOOD VI STRP: ORAL_STRIP | Status: DC

## 2014-01-11 MED ORDER — ZOSTER VACCINE LIVE 19400 UNT/0.65ML ~~LOC~~ SOLR: 0.6500 mL | Freq: Once | SUBCUTANEOUS | Status: DC

## 2014-01-11 MED ORDER — TETANUS-DIPHTH-ACELL PERTUSSIS 5-2.5-18.5 LF-MCG/0.5 IM SUSP: 0.5000 mL | Freq: Once | INTRAMUSCULAR | Status: DC

## 2014-01-11 MED ORDER — FLUTICASONE-SALMETEROL 250-50 MCG/DOSE IN AEPB: 1.0000 | INHALATION_SPRAY | Freq: Two times a day (BID) | RESPIRATORY_TRACT | Status: DC

## 2014-01-11 MED ORDER — LOSARTAN POTASSIUM 50 MG PO TABS: 50.0000 mg | ORAL_TABLET | Freq: Every day | ORAL | Status: DC

## 2014-01-11 NOTE — Patient Instructions (Signed)
Please get an eye appt.
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
## 2014-01-12 LAB — COMPREHENSIVE METABOLIC PANEL
ALT: 96 IU/L — ABNORMAL HIGH (ref 0–44)
AST: 56 IU/L — ABNORMAL HIGH (ref 0–40)
Albumin/Globulin Ratio: 2.8 — ABNORMAL HIGH (ref 1.1–2.5)
Albumin: 4.7 g/dL (ref 3.6–4.8)
Alkaline Phosphatase: 63 IU/L (ref 39–117)
BUN/Creatinine Ratio: 13 (ref 10–22)
BUN: 10 mg/dL (ref 8–27)
CO2: 24 mmol/L (ref 18–29)
Calcium: 10.2 mg/dL (ref 8.6–10.2)
Chloride: 98 mmol/L (ref 97–108)
Creatinine, Ser: 0.76 mg/dL (ref 0.76–1.27)
GFR calc Af Amer: 111 mL/min/{1.73_m2} (ref >59)
GFR calc non Af Amer: 96 mL/min/{1.73_m2} (ref >59)
Globulin, Total: 1.7 g/dL (ref 1.5–4.5)
Glucose: 191 mg/dL — ABNORMAL HIGH (ref 65–99)
Potassium: 4.5 mmol/L (ref 3.5–5.2)
Sodium: 141 mmol/L (ref 134–144)
Total Bilirubin: 0.3 mg/dL (ref 0.0–1.2)
Total Protein: 6.4 g/dL (ref 6.0–8.5)

## 2014-01-12 LAB — CBC WITH DIFFERENTIAL
Basophils Absolute: 0.1 10*3/uL (ref 0.0–0.2)
Basos: 1 %
Eos: 1 %
Eosinophils Absolute: 0.1 10*3/uL (ref 0.0–0.4)
HCT: 48.3 % (ref 37.5–51.0)
Hemoglobin: 16.5 g/dL (ref 12.6–17.7)
Immature Grans (Abs): 0 10*3/uL (ref 0.0–0.1)
Immature Granulocytes: 0 %
Lymphocytes Absolute: 2 10*3/uL (ref 0.7–3.1)
Lymphs: 23 %
MCH: 31 pg (ref 26.6–33.0)
MCHC: 34.2 g/dL (ref 31.5–35.7)
MCV: 91 fL (ref 79–97)
Monocytes Absolute: 0.6 10*3/uL (ref 0.1–0.9)
Monocytes: 7 %
Neutrophils Absolute: 5.9 10*3/uL (ref 1.4–7.0)
Neutrophils Relative %: 68 %
Platelets: 255 10*3/uL (ref 150–379)
RBC: 5.32 x10E6/uL (ref 4.14–5.80)
RDW: 13.1 % (ref 12.3–15.4)
WBC: 8.7 10*3/uL (ref 3.4–10.8)

## 2014-01-12 LAB — HEMOGLOBIN A1C
Est. average glucose Bld gHb Est-mCnc: 203 mg/dL
Hgb A1c MFr Bld: 8.7 % — ABNORMAL HIGH (ref 4.8–5.6)

## 2014-01-15 ENCOUNTER — Ambulatory Visit: Payer: Self-pay | Admitting: Internal Medicine

## 2014-01-24 ENCOUNTER — Other Ambulatory Visit: Payer: Self-pay | Admitting: *Deleted

## 2014-01-24 DIAGNOSIS — R000 Tachycardia, unspecified: Secondary | ICD-10-CM

## 2014-01-24 MED ORDER — BLOOD GLUCOSE METER KIT: PACK | Status: DC

## 2014-01-24 MED ORDER — INSULIN GLARGINE 100 UNIT/ML ~~LOC~~ SOLN: SUBCUTANEOUS | Status: DC

## 2014-01-24 MED ORDER — INSULIN GLARGINE 100 UNIT/ML SOLOSTAR PEN: PEN_INJECTOR | SUBCUTANEOUS | Status: DC

## 2014-01-24 MED ORDER — METOPROLOL TARTRATE 25 MG PO TABS: ORAL_TABLET | ORAL | Status: DC

## 2014-01-24 MED ORDER — TRUEPLUS LANCETS 28G MISC: Status: DC

## 2014-01-24 MED ORDER — GLUCOSE BLOOD VI STRP: ORAL_STRIP | Status: DC

## 2014-01-24 NOTE — Telephone Encounter (Signed)
Patient requested 

## 2014-01-24 NOTE — Telephone Encounter (Signed)
[facility]

## 2014-01-31 ENCOUNTER — Other Ambulatory Visit: Payer: Commercial Managed Care - HMO

## 2014-01-31 DIAGNOSIS — I10 Essential (primary) hypertension: Secondary | ICD-10-CM

## 2014-01-31 DIAGNOSIS — E1165 Type 2 diabetes mellitus with hyperglycemia: Principal | ICD-10-CM

## 2014-01-31 DIAGNOSIS — IMO0001 Reserved for inherently not codable concepts without codable children: Secondary | ICD-10-CM

## 2014-01-31 DIAGNOSIS — E785 Hyperlipidemia, unspecified: Secondary | ICD-10-CM

## 2014-02-01 LAB — COMPREHENSIVE METABOLIC PANEL
A/G RATIO: 2.5 (ref 1.1–2.5)
ALBUMIN: 4.7 g/dL (ref 3.6–4.8)
ALT: 85 IU/L — ABNORMAL HIGH (ref 0–44)
AST: 59 IU/L — ABNORMAL HIGH (ref 0–40)
Alkaline Phosphatase: 62 IU/L (ref 39–117)
BUN/Creatinine Ratio: 11 (ref 10–22)
BUN: 10 mg/dL (ref 8–27)
CALCIUM: 10 mg/dL (ref 8.6–10.2)
CO2: 25 mmol/L (ref 18–29)
CREATININE: 0.92 mg/dL (ref 0.76–1.27)
Chloride: 99 mmol/L (ref 97–108)
GFR calc Af Amer: 101 mL/min/{1.73_m2} (ref >59)
GFR calc non Af Amer: 87 mL/min/{1.73_m2} (ref >59)
GLOBULIN, TOTAL: 1.9 g/dL (ref 1.5–4.5)
Glucose: 137 mg/dL — ABNORMAL HIGH (ref 65–99)
Potassium: 4.3 mmol/L (ref 3.5–5.2)
SODIUM: 141 mmol/L (ref 134–144)
Total Bilirubin: 0.6 mg/dL (ref 0.0–1.2)
Total Protein: 6.6 g/dL (ref 6.0–8.5)

## 2014-02-01 LAB — LIPID PANEL
CHOL/HDL RATIO: 4 ratio (ref 0.0–5.0)
Cholesterol, Total: 195 mg/dL (ref 100–199)
HDL: 49 mg/dL (ref >39)
LDL CALC: 91 mg/dL (ref 0–99)
Triglycerides: 275 mg/dL — ABNORMAL HIGH (ref 0–149)
VLDL Cholesterol Cal: 55 mg/dL — ABNORMAL HIGH (ref 5–40)

## 2014-02-01 LAB — HEMOGLOBIN A1C
ESTIMATED AVERAGE GLUCOSE: 203 mg/dL
Hgb A1c MFr Bld: 8.7 % — ABNORMAL HIGH (ref 4.8–5.6)

## 2014-02-07 ENCOUNTER — Other Ambulatory Visit: Payer: Self-pay | Admitting: *Deleted

## 2014-02-07 MED ORDER — FLUOCINONIDE 0.05 % EX CREA: 1.0000 "application " | TOPICAL_CREAM | Freq: Two times a day (BID) | CUTANEOUS | Status: DC

## 2014-02-07 NOTE — Telephone Encounter (Signed)
Patient Requested 

## 2014-03-29 ENCOUNTER — Other Ambulatory Visit: Payer: Self-pay | Admitting: *Deleted

## 2014-03-29 MED ORDER — LORAZEPAM 1 MG PO TABS: ORAL_TABLET | ORAL | Status: DC

## 2014-03-29 NOTE — Telephone Encounter (Signed)
Patient requested to be faxed in to [facility]. Printed and faxed.

## 2014-04-13 ENCOUNTER — Ambulatory Visit (INDEPENDENT_AMBULATORY_CARE_PROVIDER_SITE_OTHER): Payer: Commercial Managed Care - HMO | Admitting: Internal Medicine

## 2014-04-13 ENCOUNTER — Encounter: Payer: Self-pay | Admitting: Internal Medicine

## 2014-04-13 VITALS — BP 140/78 | HR 84 | Temp 97.7°F | Resp 18 | Ht 67.5 in | Wt 184.2 lb

## 2014-04-13 DIAGNOSIS — J411 Mucopurulent chronic bronchitis: Secondary | ICD-10-CM

## 2014-04-13 DIAGNOSIS — M7581 Other shoulder lesions, right shoulder: Secondary | ICD-10-CM

## 2014-04-13 DIAGNOSIS — R740 Nonspecific elevation of levels of transaminase and lactic acid dehydrogenase [LDH]: Secondary | ICD-10-CM

## 2014-04-13 DIAGNOSIS — IMO0001 Reserved for inherently not codable concepts without codable children: Secondary | ICD-10-CM

## 2014-04-13 DIAGNOSIS — M67919 Unspecified disorder of synovium and tendon, unspecified shoulder: Secondary | ICD-10-CM

## 2014-04-13 DIAGNOSIS — N400 Enlarged prostate without lower urinary tract symptoms: Secondary | ICD-10-CM

## 2014-04-13 DIAGNOSIS — R7401 Elevation of levels of liver transaminase levels: Secondary | ICD-10-CM

## 2014-04-13 DIAGNOSIS — M719 Bursopathy, unspecified: Secondary | ICD-10-CM

## 2014-04-13 DIAGNOSIS — E1165 Type 2 diabetes mellitus with hyperglycemia: Principal | ICD-10-CM

## 2014-04-13 DIAGNOSIS — I10 Essential (primary) hypertension: Secondary | ICD-10-CM

## 2014-04-13 DIAGNOSIS — R7402 Elevation of levels of lactic acid dehydrogenase (LDH): Secondary | ICD-10-CM

## 2014-04-13 MED ORDER — INSULIN GLARGINE 100 UNIT/ML SOLOSTAR PEN: PEN_INJECTOR | SUBCUTANEOUS | Status: DC

## 2014-04-13 NOTE — Patient Instructions (Addendum)
Please call us back with your eye doctor's name so we can get the report.  Think about your living will.  We will get you set up for pulmonary function tests.  Come for your labs in 1 month  Try over the counter mucinex (guaifenasin is generic) for your congestion.    Increase your lantus to 50 units.  Continue metformin.
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
## 2014-04-30 ENCOUNTER — Encounter: Payer: Self-pay | Admitting: Internal Medicine

## 2014-05-15 ENCOUNTER — Other Ambulatory Visit: Payer: Commercial Managed Care - HMO

## 2014-05-15 DIAGNOSIS — IMO0001 Reserved for inherently not codable concepts without codable children: Secondary | ICD-10-CM

## 2014-05-15 DIAGNOSIS — N400 Enlarged prostate without lower urinary tract symptoms: Secondary | ICD-10-CM

## 2014-05-15 DIAGNOSIS — R7401 Elevation of levels of liver transaminase levels: Secondary | ICD-10-CM

## 2014-05-15 DIAGNOSIS — I10 Essential (primary) hypertension: Secondary | ICD-10-CM

## 2014-05-15 DIAGNOSIS — J411 Mucopurulent chronic bronchitis: Secondary | ICD-10-CM

## 2014-05-15 DIAGNOSIS — R740 Nonspecific elevation of levels of transaminase and lactic acid dehydrogenase [LDH]: Secondary | ICD-10-CM

## 2014-05-15 DIAGNOSIS — E1165 Type 2 diabetes mellitus with hyperglycemia: Principal | ICD-10-CM

## 2014-05-16 LAB — LIPID PANEL
Chol/HDL Ratio: 5.2 ratio units — ABNORMAL HIGH (ref 0.0–5.0)
Cholesterol, Total: 198 mg/dL (ref 100–199)
HDL: 38 mg/dL — ABNORMAL LOW (ref >39)
Triglycerides: 615 mg/dL (ref 0–149)

## 2014-05-16 LAB — BASIC METABOLIC PANEL
BUN/Creatinine Ratio: 19 (ref 10–22)
BUN: 14 mg/dL (ref 8–27)
CO2: 22 mmol/L (ref 18–29)
Calcium: 9.7 mg/dL (ref 8.6–10.2)
Chloride: 94 mmol/L — ABNORMAL LOW (ref 97–108)
Creatinine, Ser: 0.75 mg/dL — ABNORMAL LOW (ref 0.76–1.27)
GFR calc Af Amer: 111 mL/min/{1.73_m2} (ref >59)
GFR calc non Af Amer: 96 mL/min/{1.73_m2} (ref >59)
Glucose: 261 mg/dL — ABNORMAL HIGH (ref 65–99)
Potassium: 4.5 mmol/L (ref 3.5–5.2)
Sodium: 136 mmol/L (ref 134–144)

## 2014-05-16 LAB — CBC WITH DIFFERENTIAL
Basophils Absolute: 0.1 10*3/uL (ref 0.0–0.2)
Basos: 1 %
Eos: 1 %
Eosinophils Absolute: 0.1 10*3/uL (ref 0.0–0.4)
HCT: 48.7 % (ref 37.5–51.0)
Hemoglobin: 16.7 g/dL (ref 12.6–17.7)
Immature Grans (Abs): 0 10*3/uL (ref 0.0–0.1)
Immature Granulocytes: 0 %
Lymphocytes Absolute: 1.8 10*3/uL (ref 0.7–3.1)
Lymphs: 21 %
MCH: 31 pg (ref 26.6–33.0)
MCHC: 34.3 g/dL (ref 31.5–35.7)
MCV: 91 fL (ref 79–97)
Monocytes Absolute: 0.6 10*3/uL (ref 0.1–0.9)
Monocytes: 7 %
Neutrophils Absolute: 6.1 10*3/uL (ref 1.4–7.0)
Neutrophils Relative %: 70 %
Platelets: 256 10*3/uL (ref 150–379)
RBC: 5.38 x10E6/uL (ref 4.14–5.80)
RDW: 13.7 % (ref 12.3–15.4)
WBC: 8.8 10*3/uL (ref 3.4–10.8)

## 2014-05-16 LAB — HEPATIC FUNCTION PANEL
ALT: 56 IU/L — ABNORMAL HIGH (ref 0–44)
AST: 24 IU/L (ref 0–40)
Albumin: 4.5 g/dL (ref 3.6–4.8)
Alkaline Phosphatase: 76 IU/L (ref 39–117)
Bilirubin, Direct: 0.08 mg/dL (ref 0.00–0.40)
Total Bilirubin: 0.2 mg/dL (ref 0.0–1.2)
Total Protein: 6.8 g/dL (ref 6.0–8.5)

## 2014-05-16 LAB — HEMOGLOBIN A1C
Est. average glucose Bld gHb Est-mCnc: 197 mg/dL
Hgb A1c MFr Bld: 8.5 % — ABNORMAL HIGH (ref 4.8–5.6)

## 2014-05-17 ENCOUNTER — Other Ambulatory Visit: Payer: Self-pay | Admitting: *Deleted

## 2014-05-17 ENCOUNTER — Other Ambulatory Visit: Payer: Self-pay

## 2014-05-17 MED ORDER — LORAZEPAM 1 MG PO TABS: ORAL_TABLET | ORAL | Status: DC

## 2014-05-17 MED ORDER — FENOFIBRATE 145 MG PO TABS: 145.0000 mg | ORAL_TABLET | Freq: Every day | ORAL | Status: DC

## 2014-05-17 NOTE — Telephone Encounter (Signed)
[facility] 

## 2014-07-02 ENCOUNTER — Encounter: Payer: Self-pay | Admitting: Pharmacotherapy

## 2014-07-02 ENCOUNTER — Ambulatory Visit (INDEPENDENT_AMBULATORY_CARE_PROVIDER_SITE_OTHER): Payer: Commercial Managed Care - HMO | Admitting: Pharmacotherapy

## 2014-07-02 VITALS — BP 150/70 | HR 96 | Temp 98.0°F | Ht 67.0 in | Wt 186.6 lb

## 2014-07-02 DIAGNOSIS — I10 Essential (primary) hypertension: Secondary | ICD-10-CM

## 2014-07-02 DIAGNOSIS — E785 Hyperlipidemia, unspecified: Secondary | ICD-10-CM

## 2014-07-02 DIAGNOSIS — IMO0002 Reserved for concepts with insufficient information to code with codable children: Secondary | ICD-10-CM

## 2014-07-02 DIAGNOSIS — Z23 Encounter for immunization: Secondary | ICD-10-CM

## 2014-07-02 DIAGNOSIS — E1165 Type 2 diabetes mellitus with hyperglycemia: Secondary | ICD-10-CM

## 2014-07-02 NOTE — Progress Notes (Signed)
  Subjective:    Matthew Dominguez is a 63 y.o.white male who presents for follow-up of Type 2 diabetes mellitus.   Needs mealtime insulin, but is not taking due to cost.  He is on a fixed income. He is taking 50 units of basal insulin. Fasting BG:  85-160 Post-prandial:  160-200  He is concerned about a cough.   He quit smoking smoking 8 years ago.  He does dip tobacco.  He is not ready to quit yet.  He is trying to eat healthy meals. No routine exercise.   Denies problems with feet. He has poor vision.  Has floaters.  Last eye exam was 3 months ago. Nocturia every night. Trouble sleeping. No peripheral edema.   Review of Systems A comprehensive review of systems was negative except for: Constitutional: positive for not sleeping well Eyes: positive for visual disturbance Gastrointestinal: positive for reflux symptoms Genitourinary: positive for nocturia    Objective:    BP 150/70  Pulse 96  Temp(Src) 98 F (36.7 C) (Oral)  Ht 5' 7"$  (1.702 m)  Wt 186 lb 9.6 oz (84.641 kg)  BMI 29.22 kg/m2  SpO2 97%  General:  alert, cooperative and no distress  Oropharynx: normal findings: lips normal without lesions and gums healthy   Eyes:  negative findings: lids and lashes normal and conjunctivae and sclerae normal   Ears:  external ears normal        Lung: clear to auscultation bilaterally  Heart:  regular rate and rhythm     Extremities: no edema  Skin: warm and dry, no hyperpigmentation, vitiligo, or suspicious lesions     Neuro: mental status, speech normal, alert and oriented x3 and gait and station normal   Lab Review Glucose (mg/dL)  Date Value  05/15/2014 261*  01/31/2014 137*  01/11/2014 191*     Glucose, Bld (mg/dL)  Date Value  11/22/2012 121*     CO2 (mmol/L)  Date Value  05/15/2014 22   01/31/2014 25   01/11/2014 24      BUN (mg/dL)  Date Value  05/15/2014 14   01/31/2014 10   01/11/2014 10   11/22/2012 14      Creatinine, Ser (mg/dL)  Date Value  05/15/2014  0.75*  01/31/2014 0.92   01/11/2014 0.76     05/15/14: A1C:  8.5% Total cholesterol:  198 Triglycerides:  615 HDL:  38 LDL:  Unable to calculate  Assessment:    Diabetes Mellitus type II, under fair control. A1C above goal <7% HTN above target <140/90 Triglycerides above target <150   Plan:    1.  Rx changes: add Novolog 5 units with each meal.  His brother paid for his insulin. 2.  Continue Lantus  3.  Continue Metformin 4.  Counseled on meal planning and serving size. 5.  Counseled on need for routine exercise.  Goal is 30-45 minutes 5 x week. 6.  Counseled on basal / bolus insulin. 7.  BP at goal.   8.  Triglycerides too high.  Dr. Mariea Clonts added fenofibrate.  Better glycemic control will help. 9.  RTC in 1 month with meter to follow up glycemic control.

## 2014-07-02 NOTE — Patient Instructions (Signed)
Start Novolog 5 units with each meal.

## 2014-07-16 ENCOUNTER — Ambulatory Visit: Payer: Commercial Managed Care - HMO | Admitting: Internal Medicine

## 2014-07-30 ENCOUNTER — Ambulatory Visit (INDEPENDENT_AMBULATORY_CARE_PROVIDER_SITE_OTHER): Payer: Commercial Managed Care - HMO | Admitting: Pharmacotherapy

## 2014-07-30 ENCOUNTER — Encounter: Payer: Self-pay | Admitting: Pharmacotherapy

## 2014-07-30 VITALS — BP 140/78 | HR 68 | Temp 97.5°F | Wt 186.0 lb

## 2014-07-30 DIAGNOSIS — I10 Essential (primary) hypertension: Secondary | ICD-10-CM

## 2014-07-30 DIAGNOSIS — IMO0002 Reserved for concepts with insufficient information to code with codable children: Secondary | ICD-10-CM

## 2014-07-30 DIAGNOSIS — E1165 Type 2 diabetes mellitus with hyperglycemia: Secondary | ICD-10-CM

## 2014-07-30 MED ORDER — INSULIN ASPART 100 UNIT/ML ~~LOC~~ SOLN: 12.0000 [IU] | Freq: Three times a day (TID) | SUBCUTANEOUS | Status: DC

## 2014-07-30 NOTE — Patient Instructions (Signed)
Increase Novolog to 12 units with each meal.

## 2014-07-30 NOTE — Progress Notes (Signed)
  Subjective:    Matthew Dominguez is a 63 y.o.white male who presents for follow-up of Type 2 diabetes mellitus.   Last month we started pre-meal Novolog. Average BG:  16m/dl Last weekend he mixed up his Lantus and Novolog.  He ate to keep BG elevated.  He is currently giving 10 units with each meal. Trying to eat healthier. Some exercise. Denies problems with feet. Denies problems with vision. Denies peripheral edema. Nocturia all night.   Review of Systems A comprehensive review of systems was negative except for: Respiratory: positive for asthma and wheezing Genitourinary: positive for nocturia Endocrine: positive for diabetic symptoms including blurry vision    Objective:    BP 140/78 mmHg  Pulse 68  Temp(Src) 97.5 F (36.4 C) (Oral)  Wt 186 lb (84.369 kg)  SpO2 97%  General:  alert, cooperative and no distress  Oropharynx: normal findings: lips normal without lesions and gums healthy   Eyes:  negative findings: lids and lashes normal and conjunctivae and sclerae normal   Ears:  external ears normal        Lung: wheezes bilaterally  Heart:  regular rate and rhythm     Extremities: no edema  Skin: warm and dry, no hyperpigmentation, vitiligo, or suspicious lesions     Neuro: mental status, speech normal, alert and oriented x3 and gait and station normal   Lab Review GLUCOSE (mg/dL)  Date Value  05/15/2014 261*  01/31/2014 137*  01/11/2014 191*   GLUCOSE, BLD (mg/dL)  Date Value  11/22/2012 121*   CO2 (mmol/L)  Date Value  05/15/2014 22  01/31/2014 25  01/11/2014 24   BUN (mg/dL)  Date Value  05/15/2014 14  01/31/2014 10  01/11/2014 10  11/22/2012 14   CREATININE, SER (mg/dL)  Date Value  05/15/2014 0.75*  01/31/2014 0.92  01/11/2014 0.76       Assessment:    Diabetes Mellitus type II, under fair control. Average BG much improved. BP at goal <140/90   Plan:    1.  Rx changes: Increase Novolog to 12 units with each meal. 2.  Continue  Lantus 50 units daily. 3.  Reviewed nutrition goals. 4.  Exercise goal is 30-45 minutes 5 x week. 5.  Counseled on hypoglycemia. 6.  BP controlled.

## 2014-08-06 ENCOUNTER — Ambulatory Visit (INDEPENDENT_AMBULATORY_CARE_PROVIDER_SITE_OTHER): Payer: Commercial Managed Care - HMO | Admitting: Internal Medicine

## 2014-08-06 ENCOUNTER — Encounter: Payer: Self-pay | Admitting: Internal Medicine

## 2014-08-06 VITALS — BP 140/60 | HR 87 | Temp 98.0°F | Ht 67.0 in | Wt 187.8 lb

## 2014-08-06 DIAGNOSIS — I10 Essential (primary) hypertension: Secondary | ICD-10-CM

## 2014-08-06 DIAGNOSIS — E785 Hyperlipidemia, unspecified: Secondary | ICD-10-CM

## 2014-08-06 DIAGNOSIS — E1165 Type 2 diabetes mellitus with hyperglycemia: Secondary | ICD-10-CM

## 2014-08-06 DIAGNOSIS — N400 Enlarged prostate without lower urinary tract symptoms: Secondary | ICD-10-CM

## 2014-08-06 DIAGNOSIS — J42 Unspecified chronic bronchitis: Secondary | ICD-10-CM

## 2014-08-06 DIAGNOSIS — Z23 Encounter for immunization: Secondary | ICD-10-CM

## 2014-08-06 DIAGNOSIS — Z716 Tobacco abuse counseling: Secondary | ICD-10-CM

## 2014-08-06 DIAGNOSIS — IMO0002 Reserved for concepts with insufficient information to code with codable children: Secondary | ICD-10-CM

## 2014-08-06 MED ORDER — ZOSTER VACCINE LIVE 19400 UNT/0.65ML ~~LOC~~ SOLR: 0.6500 mL | Freq: Once | SUBCUTANEOUS | Status: DC
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
## 2014-08-20 ENCOUNTER — Other Ambulatory Visit: Payer: Self-pay | Admitting: *Deleted

## 2014-08-20 MED ORDER — LORAZEPAM 1 MG PO TABS: ORAL_TABLET | ORAL | Status: DC

## 2014-08-20 NOTE — Telephone Encounter (Signed)
[facility]

## 2014-08-21 ENCOUNTER — Other Ambulatory Visit: Payer: Self-pay | Admitting: *Deleted

## 2014-08-21 MED ORDER — LORAZEPAM 1 MG PO TABS: ORAL_TABLET | ORAL | Status: DC

## 2014-09-27 ENCOUNTER — Other Ambulatory Visit: Payer: Commercial Managed Care - HMO

## 2014-09-27 DIAGNOSIS — E1165 Type 2 diabetes mellitus with hyperglycemia: Secondary | ICD-10-CM | POA: Diagnosis not present

## 2014-09-28 LAB — COMPREHENSIVE METABOLIC PANEL
ALT: 57 IU/L — ABNORMAL HIGH (ref 0–44)
AST: 32 IU/L (ref 0–40)
Albumin/Globulin Ratio: 2.4 (ref 1.1–2.5)
Albumin: 4.4 g/dL (ref 3.6–4.8)
Alkaline Phosphatase: 42 IU/L (ref 39–117)
BUN/Creatinine Ratio: 22 (ref 10–22)
BUN: 22 mg/dL (ref 8–27)
CO2: 25 mmol/L (ref 18–29)
Calcium: 9.4 mg/dL (ref 8.6–10.2)
Chloride: 98 mmol/L (ref 97–108)
Creatinine, Ser: 0.98 mg/dL (ref 0.76–1.27)
GFR calc Af Amer: 93 mL/min/{1.73_m2} (ref >59)
GFR calc non Af Amer: 81 mL/min/{1.73_m2} (ref >59)
Globulin, Total: 1.8 g/dL (ref 1.5–4.5)
Glucose: 185 mg/dL — ABNORMAL HIGH (ref 65–99)
Potassium: 4.3 mmol/L (ref 3.5–5.2)
Sodium: 139 mmol/L (ref 134–144)
Total Bilirubin: 0.3 mg/dL (ref 0.0–1.2)
Total Protein: 6.2 g/dL (ref 6.0–8.5)

## 2014-09-28 LAB — MICROALBUMIN / CREATININE URINE RATIO
Creatinine, Ur: 97.3 mg/dL (ref 22.0–328.0)
MICROALB/CREAT RATIO: 10.6 mg/g creat (ref 0.0–30.0)
Microalbumin, Urine: 10.3 ug/mL (ref 0.0–17.0)

## 2014-09-28 LAB — HEMOGLOBIN A1C
Est. average glucose Bld gHb Est-mCnc: 148 mg/dL
Hgb A1c MFr Bld: 6.8 % — ABNORMAL HIGH (ref 4.8–5.6)

## 2014-10-01 ENCOUNTER — Other Ambulatory Visit: Payer: Self-pay | Admitting: Internal Medicine

## 2014-10-01 ENCOUNTER — Encounter: Payer: Self-pay | Admitting: Pharmacotherapy

## 2014-10-01 ENCOUNTER — Ambulatory Visit (INDEPENDENT_AMBULATORY_CARE_PROVIDER_SITE_OTHER): Payer: Commercial Managed Care - HMO | Admitting: Pharmacotherapy

## 2014-10-01 VITALS — BP 148/80 | HR 100 | Temp 97.1°F | Resp 10 | Wt 182.0 lb

## 2014-10-01 DIAGNOSIS — I10 Essential (primary) hypertension: Secondary | ICD-10-CM

## 2014-10-01 DIAGNOSIS — R05 Cough: Secondary | ICD-10-CM

## 2014-10-01 DIAGNOSIS — E1165 Type 2 diabetes mellitus with hyperglycemia: Secondary | ICD-10-CM | POA: Diagnosis not present

## 2014-10-01 DIAGNOSIS — R059 Cough, unspecified: Secondary | ICD-10-CM

## 2014-10-01 DIAGNOSIS — IMO0002 Reserved for concepts with insufficient information to code with codable children: Secondary | ICD-10-CM

## 2014-10-01 MED ORDER — INSULIN REGULAR HUMAN 100 UNIT/ML IJ SOLN: 12.0000 [IU] | Freq: Three times a day (TID) | INTRAMUSCULAR | Status: DC

## 2014-10-01 NOTE — Progress Notes (Signed)
  Subjective:    Matthew Dominguez is a 64 y.o.white maile male who presents for follow-up of Type 2 diabetes mellitus.   A1C improved to 6.8%  Denies missed doses of medications. Lantus 50 units daily. Novolog 12 units with each meal. Metformin.  Not exercising as much. Trying to eat healthy, not always successful. Denies problems with feet. Has some blurry vision. Nocturia every night.  This improved. Denies peripheral edema.  Forgot to bring blood glucose meter Cannot afford to continue Novolog.  His [facility] nurse told him he needed a pulmonology referral based on how he sounds over the phone.  Review of Systems A comprehensive review of systems was negative except for: Eyes: positive for visual disturbance Respiratory: positive for cough Genitourinary: positive for nocturia    Objective:    BP 148/80 mmHg  Pulse 100  Temp(Src) 97.1 F (36.2 C) (Oral)  Resp 10  Wt 182 lb (82.555 kg)  SpO2 93%  General:  alert, cooperative and no distress  Oropharynx: normal findings: lips normal without lesions and gums healthy   Eyes:  negative findings: lids and lashes normal and conjunctivae and sclerae normal   Ears:  external ears normal        Lung: clear to auscultation bilaterally  Heart:  regular rate and rhythm     Extremities: no edema noted  Skin: warm and dry, no hyperpigmentation, vitiligo, or suspicious lesions     Neuro: mental status, speech normal, alert and oriented x3 and gait and station normal   Lab Review GLUCOSE (mg/dL)  Date Value  09/27/2014 185*  05/15/2014 261*  01/31/2014 137*   GLUCOSE, BLD (mg/dL)  Date Value  11/22/2012 121*   CO2 (mmol/L)  Date Value  09/27/2014 25  05/15/2014 22  01/31/2014 25   BUN (mg/dL)  Date Value  09/27/2014 22  05/15/2014 14  01/31/2014 10  11/22/2012 14   CREATININE, SER (mg/dL)  Date Value  09/27/2014 0.98  05/15/2014 0.75*  01/31/2014 0.92       Assessment:    Diabetes Mellitus type II,  under excellent control. A1C at goal<7% BP slightly above goal <140/90   Plan:    1.  Rx changes:  Will change Novolog to Relion R 12 units with each meal for cost savings. 2.  Continue metformin 3.  Continue Lantus 50 units daily. 4.  Counseled on nutrition goals. 5.  Exercise goal 30-45 minutes 5 x week. 6.  BP slightly above target today - he is excited and happy.  Will continue current RX and monitor.

## 2014-10-01 NOTE — Patient Instructions (Signed)
Change Novolog to Relion R 12 units with each meals.

## 2014-10-01 NOTE — Progress Notes (Signed)
Insurance nurse has recommended he see pulmonary. Was not done previously due to not having this insurance.   Does need PFTs.

## 2014-10-04 ENCOUNTER — Ambulatory Visit (INDEPENDENT_AMBULATORY_CARE_PROVIDER_SITE_OTHER)
Admission: RE | Admit: 2014-10-04 | Discharge: 2014-10-04 | Disposition: A | Discharge status: Home / Self Care | Payer: Commercial Managed Care - HMO | Source: Ambulatory Visit | Attending: Internal Medicine | Admitting: Internal Medicine

## 2014-10-04 ENCOUNTER — Ambulatory Visit (INDEPENDENT_AMBULATORY_CARE_PROVIDER_SITE_OTHER): Payer: Commercial Managed Care - HMO | Admitting: Internal Medicine

## 2014-10-04 ENCOUNTER — Encounter: Payer: Self-pay | Admitting: Internal Medicine

## 2014-10-04 VITALS — BP 144/66 | HR 87 | Temp 97.9°F | Ht 67.0 in | Wt 192.0 lb

## 2014-10-04 DIAGNOSIS — I10 Essential (primary) hypertension: Secondary | ICD-10-CM

## 2014-10-04 DIAGNOSIS — R9389 Abnormal findings on diagnostic imaging of other specified body structures: Secondary | ICD-10-CM

## 2014-10-04 DIAGNOSIS — R938 Abnormal findings on diagnostic imaging of other specified body structures: Secondary | ICD-10-CM

## 2014-10-04 DIAGNOSIS — J432 Centrilobular emphysema: Secondary | ICD-10-CM | POA: Diagnosis not present

## 2014-10-04 DIAGNOSIS — R918 Other nonspecific abnormal finding of lung field: Secondary | ICD-10-CM | POA: Diagnosis not present

## 2014-10-04 DIAGNOSIS — J449 Chronic obstructive pulmonary disease, unspecified: Secondary | ICD-10-CM | POA: Diagnosis not present

## 2014-10-04 DIAGNOSIS — R05 Cough: Secondary | ICD-10-CM | POA: Diagnosis not present

## 2014-10-04 MED ORDER — FAMOTIDINE 20 MG PO TABS: ORAL_TABLET | ORAL | Status: DC

## 2014-10-04 MED ORDER — BUDESONIDE-FORMOTEROL FUMARATE 80-4.5 MCG/ACT IN AERO: INHALATION_SPRAY | RESPIRATORY_TRACT | Status: DC

## 2014-10-04 MED ORDER — NEBIVOLOL HCL 10 MG PO TABS: 10.0000 mg | ORAL_TABLET | Freq: Every day | ORAL | Status: DC

## 2014-10-04 NOTE — Assessment & Plan Note (Addendum)
He appears to have moderate copd with chronic bronchitis though he really doesn't produce excess mucus and concerned about L hilum ? Neoplasm but note the changes are chronic  In any case, his symptoms have proved very difficult to control chronically. DDX of  difficult airways management all start with A and  include Adherence, Ace Inhibitors, Acid Reflux, Active Sinus Disease, Alpha 1 Antitripsin deficiency, Anxiety masquerading as Airways dz,  ABPA,  allergy(esp in young), Aspiration (esp in elderly), Adverse effects of DPI,  Active smokers, plus two Bs  = Bronchiectasis and Beta blocker use..and one C= CHF  Adherence is always the initial "prime suspect" and is a multilayered concern that requires a "trust but verify" approach in every patient - starting with knowing how to use medications, especially inhalers, correctly, keeping up with refills and understanding the fundamental difference between maintenance and prns vs those medications only taken for a very short course and then stopped and not refilled.  The proper method of use, as well as anticipated side effects, of a metered-dose inhaler are discussed and demonstrated to the patient. Improved effectiveness after extensive coaching during this visit to a level of approximately  75% so try symbicort 80 2bid (the lower strength is less likely to aggravate the cough   ? Acid (or non-acid) GERD > always difficult to exclude as up to 75% of pts in some series report no assoc GI/ Heartburn symptoms> rec max (24h)  acid suppression and diet restrictions/ reviewed and instructions given in writing.   ? BB effect > see hbp  ? Allergy > very unlikely at his age/ keep in ddx though  ? Active sinus CT > check sinus ct if not better at next ov   See instructions for specific recommendations which were reviewed directly with the patient who was given a copy with highlighter outlining the key components.  Desperately needs med reconciliation >  To keep  things simple, I have asked the patient to first separate medicines that are perceived as maintenance, that is to be taken daily "no matter what", from those medicines that are taken on only on an as-needed basis and I have given the patient examples of both, and then return to see our NP to generate a  detailed  medication calendar which should be followed until the next physician sees the patient and updates it.

## 2014-10-04 NOTE — Patient Instructions (Addendum)
Stop lopressor (metaprolol) cozar and advair  Start symbicort 80 Take 2 puffs first thing in am and then another 2 puffs about 12 hours later.   Work on inhaler technique:  relax and gently blow all the way out then take a nice smooth deep breath back in, triggering the inhaler at same time you start breathing in.  Hold for up to 5 seconds if you can.  Rinse and gargle with water when done  Start bystolic  10 mg one daily   Pepcid ac 20 mg at bedtime (can get over the counter or use prescription)  Prilosec Take 30-60 min before first meal of the day   GERD (REFLUX)  is an extremely common cause of respiratory symptoms just like yours , many times with no obvious heartburn at all.    It can be treated with medication, but also with lifestyle changes including avoidance of late meals, excessive alcohol, smoking cessation, and avoid fatty foods, chocolate, peppermint, colas, red wine, and acidic juices such as orange juice.  NO MINT OR MENTHOL PRODUCTS SO NO COUGH DROPS  USE SUGARLESS CANDY INSTEAD (Jolley ranchers or Stover's or Life Savers) or even ice chips will also do - the key is to swallow to prevent all throat clearing. NO OIL BASED VITAMINS - use powdered substitutes.   Please remember to go to the  x-ray department downstairs for your tests - we will call you with the results when they are available.     See Tammy NP w/in 2 weeks with all your medications, even over the counter meds, separated in two separate bags, the ones you take no matter what vs the ones you stop once you feel better and take only as needed when you feel you need them.   Tammy  will generate for you a new user friendly medication calendar that will put [place] all on the same page re: your medication use.     Without this process, it simply isn't possible to assure that we are providing  your outpatient care  with  the attention to detail we feel you deserve.   If we cannot assure that you're getting that kind of  care,  then we cannot manage your problem effectively from this clinic.  Once you have seen Tammy and we are sure that we're all on the same page with your medication use she will arrange follow up with me with pfts  Late add : if not better do CT chest and sinus before ov with me

## 2014-10-04 NOTE — Assessment & Plan Note (Signed)
Strongly prefer in this setting: Bystolic, the most beta -1  selective Beta blocker available in sample form, with bisoprolol the most selective generic choice  on the market.   Try bystolic 10 mg daily

## 2014-10-04 NOTE — Progress Notes (Signed)
   Subjective:    Patient ID: Matthew Dominguez, male    DOB: 09-12-1950,    MRN: [id number]  HPI  28 yowm quit smoking 2005 with h/o whooping cough in 8h grade and wheezing every night since then worse with colds no better p quit smling referred by Dr Hollace Kinnier to pulmonary clinic 10/04/2014    10/04/2014 1st [facility] office visit/ Matthew Dominguez   Chief Complaint  Patient presents with  . Advice Only    Referred by Hollace Kinnier for wheezing, cough X6 months.    symptoms since 8th grade daily but "never slowed him down" noct wheezing and cough then much worse x 6 month  Wheezing at hs never makes him sit up or wakes him up, no excess or purulent sputum "just feels like it's down there" Not much better with use of multiple inhalers  Assoc with nasal congestion but no purulent nasal discharge or sinus pain   No obvious   patterns in day to day or daytime variabilty or assoc limiting sob or cp or chest tightness,  Or  overt   hb symptoms. No unusual exp hx or   knowledge of premature birth.   Also denies any obvious fluctuation of symptoms with weather or environmental changes or other aggravating or alleviating factors except as outlined above   Current Medications, Allergies, Complete Past Medical History, Past Surgical History, Family History, and Social History were reviewed in [employer] record.               Review of Systems  Constitutional: Negative for fever and unexpected weight change.  HENT: Positive for congestion and rhinorrhea. Negative for dental problem, ear pain, nosebleeds, postnasal drip, sinus pressure, sneezing, sore throat and trouble swallowing.   Eyes: Negative for redness and itching.  Respiratory: Positive for cough, shortness of breath and wheezing. Negative for chest tightness.   Cardiovascular: Negative for palpitations and leg swelling.  Gastrointestinal: Negative for nausea and vomiting.  Genitourinary: Negative for dysuria.    Musculoskeletal: Negative for joint swelling.  Skin: Negative for rash.  Neurological: Negative for headaches.  Hematological: Does not bruise/bleed easily.  Psychiatric/Behavioral: Negative for dysphoric mood. The patient is not nervous/anxious.        Objective:   Physical Exam  Stoic wm nad  Wt Readings from Last 3 Encounters:  10/04/14 87.091 kg (192 lb)  10/01/14 82.555 kg (182 lb)  08/06/14 85.186 kg (187 lb 12.8 oz)    Vital signs reviewed  HEENT mild turbinate edema.  Oropharynx no thrush or excess pnd or cobblestoning.  No JVD or cervical adenopathy. Mild accessory muscle hypertrophy. Trachea midline, nl thryroid. Chest was hyperinflated by percussion with diminished breath sounds and moderate increased exp time without wheeze. Hoover sign positive at mid inspiration. Regular rate and rhythm without murmur gallop or rub or increase P2 or edema.  Abd: no hsm, nl excursion. Ext warm without cyanosis or clubbing.     CXR PA and Lateral:   10/04/2014 :     I personally reviewed images and agree with radiology impression as follows:   1  ???  New left lower lobe infiltrate consistent with pneumonia (I doubt strongly) 2. Stable prominence left hilum, most likely prominent pulmonary vessel. This has been stable over multiple prior exams. Heart size normal.       Assessment & Plan:

## 2014-10-05 NOTE — Progress Notes (Signed)
Quick Note:  Spoke with pt and notified of results per Dr. Wert. Pt verbalized understanding and denied any questions.  ______ 

## 2014-10-07 ENCOUNTER — Encounter: Payer: Self-pay | Admitting: Internal Medicine

## 2014-10-07 DIAGNOSIS — R9389 Abnormal findings on diagnostic imaging of other specified body structures: Secondary | ICD-10-CM | POA: Insufficient documentation

## 2014-10-07 NOTE — Assessment & Plan Note (Signed)
I'm not convinced there is any as dz or pna radiographically and clinically his problem is certainly not an acute pna so low threshold to CT chest on return if not better.

## 2014-10-18 ENCOUNTER — Encounter: Payer: Self-pay | Admitting: Adult Health

## 2014-10-18 ENCOUNTER — Ambulatory Visit (INDEPENDENT_AMBULATORY_CARE_PROVIDER_SITE_OTHER): Payer: Commercial Managed Care - HMO | Admitting: Adult Health

## 2014-10-18 VITALS — BP 144/74 | HR 68 | Temp 98.5°F | Ht 68.0 in | Wt 190.8 lb

## 2014-10-18 DIAGNOSIS — J449 Chronic obstructive pulmonary disease, unspecified: Secondary | ICD-10-CM

## 2014-10-18 MED ORDER — BISOPROLOL FUMARATE 10 MG PO TABS: 10.0000 mg | ORAL_TABLET | Freq: Every day | ORAL | Status: DC

## 2014-10-18 NOTE — Progress Notes (Signed)
Subjective:    Patient ID: Matthew Dominguez, male    DOB: May 06, 1951,    MRN: [id number]  HPI  1 yowm quit smoking 2005 with h/o whooping cough in 8h grade and wheezing every night since then worse with colds no better p quit smling referred by Dr Hollace Kinnier to pulmonary clinic 10/04/2014    10/04/2014 1st [facility] office visit/ Wert   Chief Complaint  Patient presents with  . Advice Only    Referred by Hollace Kinnier for wheezing, cough X6 months.    symptoms since 8th grade daily but "never slowed him down" noct wheezing and cough then much worse x 6 month  Wheezing at hs never makes him sit up or wakes him up, no excess or purulent sputum "just feels like it's down there" Not much better with use of multiple inhalers  Assoc with nasal congestion but no purulent nasal discharge or sinus pain  >stop lopressor /adviar > symbicort    10/18/2014 Follow up and Med Review  Patient returns for two-week follow-up and medication review  We reviewed all his medications and organized them into a medication count with patient education Patient was seen last visit for an evaluation of cough and COPD Has a strong history of heavy smoking. He was recommended to stop Lopressor and Advair and begin Symbicort. He was placed on Bystolic. Unfortunately he did not understand his instructions and continued on Lopressor and also Bystolic. We discussed this medication change, and he is now aware. He says that he has a very limited income and cannot afford high copayment and office visits on a regular basis He is also taking castor oil on occasion and daily. Echinacea We did discuss discontinuing these medications. He says that his cough may be slightly better since last visit. He denies any chest pain, orthopnea, PND, leg swelling, hemoptysis or unintentional weight loss     Current Medications, Allergies, Complete Past Medical History, Past Surgical History, Family History, and Social History  were reviewed in [employer] record.               Review of Systems  Constitutional: Negative for fever and unexpected weight change.  HENT. Negative for dental problem, ear pain, nosebleeds, postnasal drip, sinus pressure, sneezing, sore throat and trouble swallowing.   Eyes: Negative for redness and itching.  Respiratory: Positive for cough, shortness of breath. Negative for chest tightness.   Cardiovascular: Negative for palpitations and leg swelling.  Gastrointestinal: Negative for nausea and vomiting.  Genitourinary: Negative for dysuria.  Musculoskeletal: Negative for joint swelling.  Skin: Negative for rash.  Neurological: Negative for headaches.  Hematological: Does not bruise/bleed easily.  Psychiatric/Behavioral: Negative for dysphoric mood. The patient is not nervous/anxious.        Objective:   Physical Exam  Stoic wm nad   Vital signs reviewed  HEENT mild turbinate edema.  Oropharynx no thrush or excess pnd or cobblestoning.  No JVD or cervical adenopathy. Mild accessory muscle hypertrophy. Trachea midline, nl thryroid. Chest was hyperinflated by percussion with diminished breath sounds and moderate increased exp time without wheeze. Hoover sign positive at mid inspiration. Regular rate and rhythm without murmur gallop or rub or increase P2 or edema.  Abd: no hsm, nl excursion. Ext warm without cyanosis or clubbing.     CXR PA and Lateral:   10/04/2014 :     I personally reviewed images and agree with radiology impression as follows:   1  ???  New left lower lobe infiltrate consistent with pneumonia (I doubt strongly) 2. Stable prominence left hilum, most likely prominent pulmonary vessel. This has been stable over multiple prior exams. Heart size normal.       Assessment & Plan:

## 2014-10-18 NOTE — Addendum Note (Signed)
Addended by: Parke Poisson E on: 10/18/2014 05:09 PM   Modules accepted: Orders, Medications

## 2014-10-18 NOTE — Assessment & Plan Note (Signed)
Suspect he has underlying COPD with his history of smoking and chronic shortness of breath Have advised him to continue on Symbicort follow-up in 6 weeks with pulmonary function test Patient's medications were reviewed today and patient education was given. Computerized medication calendar was adjusted/completed  Would avoid nonselective beta blockers in this patient . If possible Would avoid castor oil use.   Plan  Take Bisoprolol 10mg  daily (this is the generic form of Bystolic) .  Stop Metoprolol .  Do not use Castor oil.  May use Stool softner colace daily As needed  Constipation  Follow med calendar closely and bring to each visit.  We are setting you up for a PFT .  Follow up Dr. Melvyn Novas  In 2 months with PFT

## 2014-10-18 NOTE — Patient Instructions (Signed)
Take Bisoprolol 10mg  daily (this is the generic form of Bystolic) .  Stop Metoprolol .  Do not use Castor oil.  May use Stool softner colace daily As needed  Constipation  We are setting you up for a PFT .  Follow up Dr. Melvyn Novas  In 2 months with PFT

## 2014-10-29 NOTE — Addendum Note (Signed)
Addended by: Parke Poisson E on: 10/29/2014 01:43 PM   Modules accepted: Orders, Medications

## 2014-11-16 ENCOUNTER — Telehealth: Payer: Self-pay | Admitting: *Deleted

## 2014-11-16 ENCOUNTER — Telehealth: Payer: Self-pay

## 2014-11-16 NOTE — Telephone Encounter (Signed)
Called to inform pt it is time to f/u on cxr and also have PFT done  He refuses to schedule appt stating "I didn't like coming there"  I advised that it is important to f/u on his cxr, and he verbalized understanding, but again refuses appt  I will forward to MW to be made aware  Also have sent note to his PCP

## 2014-11-16 NOTE — Telephone Encounter (Signed)
-----   Message from Tanda Rockers, MD sent at 10/07/2014  2:40 PM EST ----- F/u cxr by now due if didn't have CT chest in interim

## 2014-11-16 NOTE — Telephone Encounter (Signed)
Left message on voicemail for patient to return call when available , reason for call:   Please call Matthew Dominguez to make sure he is not having fever, chills, green sputum, loss of appetite due to his xray findings. If he's ok, we can review his xray further at his regular appt at the end of the month.        ----- Message -----     From: Rosana Berger, CMA     Sent: 11/16/2014 10:34 AM      To: [name], DO        Attn: Dr Mariea Clonts    Pt refuses to return for f/u with Dr Melvyn Novas

## 2014-11-16 NOTE — Telephone Encounter (Signed)
Aware, nothing further to do

## 2014-11-20 ENCOUNTER — Other Ambulatory Visit: Payer: Self-pay | Admitting: Internal Medicine

## 2014-11-22 ENCOUNTER — Other Ambulatory Visit: Payer: Self-pay | Admitting: Internal Medicine

## 2014-11-26 ENCOUNTER — Other Ambulatory Visit: Payer: Self-pay | Admitting: Internal Medicine

## 2014-12-03 ENCOUNTER — Ambulatory Visit (INDEPENDENT_AMBULATORY_CARE_PROVIDER_SITE_OTHER): Payer: Commercial Managed Care - HMO | Admitting: Internal Medicine

## 2014-12-03 ENCOUNTER — Ambulatory Visit
Admission: RE | Admit: 2014-12-03 | Discharge: 2014-12-03 | Disposition: A | Discharge status: Home / Self Care | Payer: Commercial Managed Care - HMO | Source: Ambulatory Visit | Attending: Internal Medicine | Admitting: Internal Medicine

## 2014-12-03 ENCOUNTER — Encounter: Payer: Self-pay | Admitting: Internal Medicine

## 2014-12-03 VITALS — BP 124/78 | HR 73 | Temp 97.9°F | Resp 18 | Ht 68.0 in | Wt 190.6 lb

## 2014-12-03 DIAGNOSIS — R9389 Abnormal findings on diagnostic imaging of other specified body structures: Secondary | ICD-10-CM

## 2014-12-03 DIAGNOSIS — IMO0002 Reserved for concepts with insufficient information to code with codable children: Secondary | ICD-10-CM

## 2014-12-03 DIAGNOSIS — R938 Abnormal findings on diagnostic imaging of other specified body structures: Secondary | ICD-10-CM

## 2014-12-03 DIAGNOSIS — R05 Cough: Secondary | ICD-10-CM | POA: Diagnosis not present

## 2014-12-03 DIAGNOSIS — Z5181 Encounter for therapeutic drug level monitoring: Secondary | ICD-10-CM | POA: Diagnosis not present

## 2014-12-03 DIAGNOSIS — Z23 Encounter for immunization: Secondary | ICD-10-CM | POA: Diagnosis not present

## 2014-12-03 DIAGNOSIS — Z716 Tobacco abuse counseling: Secondary | ICD-10-CM

## 2014-12-03 DIAGNOSIS — I10 Essential (primary) hypertension: Secondary | ICD-10-CM | POA: Diagnosis not present

## 2014-12-03 DIAGNOSIS — R053 Chronic cough: Secondary | ICD-10-CM

## 2014-12-03 DIAGNOSIS — E1165 Type 2 diabetes mellitus with hyperglycemia: Secondary | ICD-10-CM | POA: Diagnosis not present

## 2014-12-03 MED ORDER — ZOSTER VACCINE LIVE 19400 UNT/0.65ML ~~LOC~~ SOLR: 0.6500 mL | Freq: Once | SUBCUTANEOUS | Status: DC
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
## 2014-12-05 ENCOUNTER — Other Ambulatory Visit: Payer: Self-pay | Admitting: *Deleted

## 2014-12-05 DIAGNOSIS — J432 Centrilobular emphysema: Secondary | ICD-10-CM

## 2014-12-05 MED ORDER — BUDESONIDE-FORMOTEROL FUMARATE 80-4.5 MCG/ACT IN AERO
INHALATION_SPRAY | RESPIRATORY_TRACT | Status: DC
Start: 2014-12-05 — End: 2015-09-27

## 2014-12-05 NOTE — Telephone Encounter (Signed)
Patient was informed of Chest X-Ray Results, i clicked reviewed before commented. Patient requested refill on Symbicort. Faxed to pharmacy

## 2014-12-18 ENCOUNTER — Encounter: Payer: Self-pay | Admitting: Gastroenterology

## 2014-12-20 ENCOUNTER — Other Ambulatory Visit: Payer: Commercial Managed Care - HMO

## 2014-12-20 DIAGNOSIS — E1165 Type 2 diabetes mellitus with hyperglycemia: Secondary | ICD-10-CM | POA: Diagnosis not present

## 2014-12-20 DIAGNOSIS — IMO0002 Reserved for concepts with insufficient information to code with codable children: Secondary | ICD-10-CM

## 2014-12-21 LAB — COMPREHENSIVE METABOLIC PANEL
ALT: 65 IU/L — ABNORMAL HIGH (ref 0–44)
AST: 40 IU/L (ref 0–40)
Albumin/Globulin Ratio: 2.1 (ref 1.1–2.5)
Albumin: 4.2 g/dL (ref 3.6–4.8)
Alkaline Phosphatase: 43 IU/L (ref 39–117)
BUN/Creatinine Ratio: 10 (ref 10–22)
BUN: 10 mg/dL (ref 8–27)
Bilirubin Total: 0.3 mg/dL (ref 0.0–1.2)
CO2: 26 mmol/L (ref 18–29)
Calcium: 9.5 mg/dL (ref 8.6–10.2)
Chloride: 101 mmol/L (ref 97–108)
Creatinine, Ser: 1.03 mg/dL (ref 0.76–1.27)
GFR calc Af Amer: 88 mL/min/{1.73_m2} (ref >59)
GFR calc non Af Amer: 76 mL/min/{1.73_m2} (ref >59)
Globulin, Total: 2 g/dL (ref 1.5–4.5)
Glucose: 155 mg/dL — ABNORMAL HIGH (ref 65–99)
Potassium: 4.3 mmol/L (ref 3.5–5.2)
Sodium: 142 mmol/L (ref 134–144)
Total Protein: 6.2 g/dL (ref 6.0–8.5)

## 2014-12-21 LAB — LIPID PANEL
Chol/HDL Ratio: 3.2 ratio units (ref 0.0–5.0)
Cholesterol, Total: 152 mg/dL (ref 100–199)
HDL: 47 mg/dL (ref >39)
LDL Calculated: 73 mg/dL (ref 0–99)
Triglycerides: 158 mg/dL — ABNORMAL HIGH (ref 0–149)
VLDL Cholesterol Cal: 32 mg/dL (ref 5–40)

## 2014-12-21 LAB — HEMOGLOBIN A1C
Est. average glucose Bld gHb Est-mCnc: 148 mg/dL
Hgb A1c MFr Bld: 6.8 % — ABNORMAL HIGH (ref 4.8–5.6)

## 2014-12-24 ENCOUNTER — Encounter: Payer: Self-pay | Admitting: Pharmacotherapy

## 2014-12-24 ENCOUNTER — Ambulatory Visit (INDEPENDENT_AMBULATORY_CARE_PROVIDER_SITE_OTHER): Payer: Commercial Managed Care - HMO | Admitting: Pharmacotherapy

## 2014-12-24 VITALS — BP 122/78 | HR 67 | Temp 97.8°F | Resp 20 | Ht 68.0 in | Wt 192.6 lb

## 2014-12-24 DIAGNOSIS — I10 Essential (primary) hypertension: Secondary | ICD-10-CM | POA: Diagnosis not present

## 2014-12-24 DIAGNOSIS — IMO0002 Reserved for concepts with insufficient information to code with codable children: Secondary | ICD-10-CM

## 2014-12-24 DIAGNOSIS — E1165 Type 2 diabetes mellitus with hyperglycemia: Secondary | ICD-10-CM | POA: Diagnosis not present

## 2014-12-24 MED ORDER — INSULIN GLARGINE 100 UNIT/ML SOLOSTAR PEN: 50.0000 [IU] | PEN_INJECTOR | Freq: Every day | SUBCUTANEOUS | Status: DC

## 2014-12-24 NOTE — Patient Instructions (Signed)
Need to exercise daily.  Goal is 30-45 minutes.

## 2014-12-24 NOTE — Progress Notes (Signed)
  Subjective:    Matthew Dominguez is a 64 y.o.white male who presents for follow-up of Type 2 diabetes mellitus.   A1C 6.8% - no change. On Lantus 50 units and Humulin R 12 units before each meal.  Forgot to bring meter Self-reports BG 80-125mg /dl Hypoglycemia is rare.  Struggles with healthy food choices. No routine exercise. Denies problems with feet. Denies problems with peripheral edema. Has blurry vision.  Some vision loss. Nocturia every night.  Review of Systems A comprehensive review of systems was negative except for: Eyes: positive for irritation Genitourinary: positive for nocturia Endocrine: positive for diabetic symptoms including blurry vision and polyuria    Objective:    BP 122/78 mmHg  Pulse 67  Temp(Src) 97.8 F (36.6 C) (Oral)  Resp 20  Ht 5\' 8"  (1.727 m)  Wt 192 lb 9.6 oz (87.363 kg)  BMI 29.29 kg/m2  SpO2 94%  General:  alert, cooperative and no distress  Oropharynx: normal findings: lips normal without lesions and gums healthy   Eyes:  negative findings: lids and lashes normal and conjunctivae and sclerae normal   Ears:  external ears normal        Lung: clear to auscultation bilaterally  Heart:  regular rate and rhythm     Extremities: extremities normal, atraumatic, no cyanosis or edema  Skin: warm and dry, no hyperpigmentation, vitiligo, or suspicious lesions     Neuro: mental status, speech normal, alert and oriented x3 and gait and station normal   Lab Review GLUCOSE (mg/dL)  Date Value  12/20/2014 155*  09/27/2014 185*  05/15/2014 261*   GLUCOSE, BLD (mg/dL)  Date Value  11/22/2012 121*   CO2 (mmol/L)  Date Value  12/20/2014 26  09/27/2014 25  05/15/2014 22   BUN (mg/dL)  Date Value  12/20/2014 10  09/27/2014 22  05/15/2014 14  11/22/2012 14   CREATININE, SER (mg/dL)  Date Value  12/20/2014 1.03  09/27/2014 0.98  05/15/2014 0.75*       Assessment:    Diabetes Mellitus type II, under excellent control.  A1C at  goal <7% BP at goal <140/90   Plan:    1.  Rx changes: none 2.  Continue Lantus 50 units daily. 3.  Continue Regular insulin 12 units with each meal. 4.  Counseled on  Nutrition goals. 5.  Counseled on benefit of routine exercise.  Goal is 30-45 minutes 5 x week. 6.  BP at goal on current medication.

## 2014-12-31 ENCOUNTER — Encounter: Payer: Self-pay | Admitting: Internal Medicine

## 2015-02-08 ENCOUNTER — Other Ambulatory Visit: Payer: Self-pay | Admitting: Internal Medicine

## 2015-02-14 ENCOUNTER — Other Ambulatory Visit: Payer: Self-pay | Admitting: Internal Medicine

## 2015-03-20 ENCOUNTER — Other Ambulatory Visit: Payer: Self-pay | Admitting: Internal Medicine

## 2015-03-21 ENCOUNTER — Other Ambulatory Visit: Payer: Self-pay

## 2015-03-21 MED ORDER — ACCU-CHEK SOFTCLIX LANCETS MISC: Status: DC

## 2015-03-21 MED ORDER — ACCU-CHEK AVIVA PLUS W/DEVICE KIT: PACK | Status: DC

## 2015-03-21 MED ORDER — BD SWAB SINGLE USE REGULAR PADS: MEDICATED_PAD | Status: DC

## 2015-03-21 MED ORDER — GLUCOSE BLOOD VI STRP: ORAL_STRIP | Status: DC

## 2015-03-25 ENCOUNTER — Other Ambulatory Visit: Payer: Self-pay | Admitting: *Deleted

## 2015-03-25 MED ORDER — ACCU-CHEK SOFTCLIX LANCETS MISC: Status: DC

## 2015-03-25 MED ORDER — BD SWAB SINGLE USE REGULAR PADS: MEDICATED_PAD | Status: DC

## 2015-03-25 MED ORDER — ACCU-CHEK AVIVA PLUS W/DEVICE KIT: PACK | Status: DC

## 2015-03-25 MED ORDER — GLUCOSE BLOOD VI STRP: ORAL_STRIP | Status: DC

## 2015-03-25 NOTE — Telephone Encounter (Signed)
[facility] 

## 2015-04-04 ENCOUNTER — Other Ambulatory Visit: Payer: Self-pay | Admitting: *Deleted

## 2015-04-04 ENCOUNTER — Encounter: Payer: Self-pay | Admitting: Internal Medicine

## 2015-04-04 ENCOUNTER — Ambulatory Visit (INDEPENDENT_AMBULATORY_CARE_PROVIDER_SITE_OTHER): Payer: Commercial Managed Care - HMO | Admitting: Internal Medicine

## 2015-04-04 VITALS — BP 120/72 | HR 72 | Temp 98.2°F | Resp 20 | Ht 68.0 in | Wt 192.8 lb

## 2015-04-04 DIAGNOSIS — Z23 Encounter for immunization: Secondary | ICD-10-CM

## 2015-04-04 DIAGNOSIS — R05 Cough: Secondary | ICD-10-CM | POA: Diagnosis not present

## 2015-04-04 DIAGNOSIS — I10 Essential (primary) hypertension: Secondary | ICD-10-CM

## 2015-04-04 DIAGNOSIS — E119 Type 2 diabetes mellitus without complications: Secondary | ICD-10-CM | POA: Diagnosis not present

## 2015-04-04 DIAGNOSIS — R053 Chronic cough: Secondary | ICD-10-CM

## 2015-04-04 DIAGNOSIS — Z716 Tobacco abuse counseling: Secondary | ICD-10-CM

## 2015-04-04 DIAGNOSIS — N400 Enlarged prostate without lower urinary tract symptoms: Secondary | ICD-10-CM

## 2015-04-04 DIAGNOSIS — E785 Hyperlipidemia, unspecified: Secondary | ICD-10-CM | POA: Diagnosis not present

## 2015-04-04 MED ORDER — BD SWAB SINGLE USE REGULAR PADS: MEDICATED_PAD | Status: DC

## 2015-04-04 NOTE — Telephone Encounter (Signed)
[facility].  
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
## 2015-04-15 ENCOUNTER — Other Ambulatory Visit: Payer: Self-pay | Admitting: Adult Health

## 2015-04-16 NOTE — Telephone Encounter (Signed)
Pt needs to make follow up appt 

## 2015-04-17 ENCOUNTER — Encounter: Payer: Self-pay | Admitting: Gastroenterology

## 2015-05-02 ENCOUNTER — Other Ambulatory Visit: Payer: Commercial Managed Care - HMO

## 2015-05-02 DIAGNOSIS — E1165 Type 2 diabetes mellitus with hyperglycemia: Secondary | ICD-10-CM | POA: Diagnosis not present

## 2015-05-02 DIAGNOSIS — IMO0002 Reserved for concepts with insufficient information to code with codable children: Secondary | ICD-10-CM

## 2015-05-03 LAB — COMPREHENSIVE METABOLIC PANEL
ALT: 67 IU/L — ABNORMAL HIGH (ref 0–44)
AST: 45 IU/L — ABNORMAL HIGH (ref 0–40)
Albumin/Globulin Ratio: 2 (ref 1.1–2.5)
Albumin: 4.3 g/dL (ref 3.6–4.8)
Alkaline Phosphatase: 43 IU/L (ref 39–117)
BUN/Creatinine Ratio: 13 (ref 10–22)
BUN: 12 mg/dL (ref 8–27)
Bilirubin Total: 0.2 mg/dL (ref 0.0–1.2)
CO2: 27 mmol/L (ref 18–29)
Calcium: 9.9 mg/dL (ref 8.6–10.2)
Chloride: 98 mmol/L (ref 97–108)
Creatinine, Ser: 0.96 mg/dL (ref 0.76–1.27)
GFR calc Af Amer: 95 mL/min/{1.73_m2} (ref >59)
GFR calc non Af Amer: 82 mL/min/{1.73_m2} (ref >59)
Globulin, Total: 2.1 g/dL (ref 1.5–4.5)
Glucose: 63 mg/dL — ABNORMAL LOW (ref 65–99)
Potassium: 4.4 mmol/L (ref 3.5–5.2)
Sodium: 144 mmol/L (ref 134–144)
Total Protein: 6.4 g/dL (ref 6.0–8.5)

## 2015-05-03 LAB — MICROALBUMIN / CREATININE URINE RATIO
Creatinine, Urine: 46.9 mg/dL
MICROALB/CREAT RATIO: 8.1 mg/g creat (ref 0.0–30.0)
Microalbumin, Urine: 3.8 ug/mL

## 2015-05-03 LAB — LIPID PANEL
Chol/HDL Ratio: 3.5 ratio units (ref 0.0–5.0)
Cholesterol, Total: 166 mg/dL (ref 100–199)
HDL: 47 mg/dL (ref >39)
LDL Calculated: 91 mg/dL (ref 0–99)
Triglycerides: 138 mg/dL (ref 0–149)
VLDL Cholesterol Cal: 28 mg/dL (ref 5–40)

## 2015-05-03 LAB — HEMOGLOBIN A1C
Est. average glucose Bld gHb Est-mCnc: 148 mg/dL
Hgb A1c MFr Bld: 6.8 % — ABNORMAL HIGH (ref 4.8–5.6)

## 2015-05-06 ENCOUNTER — Encounter: Payer: Self-pay | Admitting: Pharmacotherapy

## 2015-05-06 ENCOUNTER — Ambulatory Visit (INDEPENDENT_AMBULATORY_CARE_PROVIDER_SITE_OTHER): Payer: Commercial Managed Care - HMO | Admitting: Pharmacotherapy

## 2015-05-06 VITALS — BP 158/88 | HR 87 | Temp 98.2°F | Resp 20 | Ht 68.0 in | Wt 191.6 lb

## 2015-05-06 DIAGNOSIS — I10 Essential (primary) hypertension: Secondary | ICD-10-CM

## 2015-05-06 DIAGNOSIS — E119 Type 2 diabetes mellitus without complications: Secondary | ICD-10-CM
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
## 2015-05-16 ENCOUNTER — Other Ambulatory Visit: Payer: Self-pay | Admitting: Adult Health

## 2015-05-16 ENCOUNTER — Other Ambulatory Visit: Payer: Self-pay | Admitting: Nurse Practitioner

## 2015-05-16 ENCOUNTER — Other Ambulatory Visit: Payer: Self-pay | Admitting: Internal Medicine

## 2015-07-08 ENCOUNTER — Other Ambulatory Visit: Payer: Self-pay | Admitting: Internal Medicine

## 2015-07-16 ENCOUNTER — Other Ambulatory Visit: Payer: Self-pay | Admitting: Internal Medicine

## 2015-07-16 ENCOUNTER — Other Ambulatory Visit: Payer: Self-pay | Admitting: Adult Health

## 2015-08-05 ENCOUNTER — Ambulatory Visit (INDEPENDENT_AMBULATORY_CARE_PROVIDER_SITE_OTHER): Payer: Commercial Managed Care - HMO | Admitting: Internal Medicine

## 2015-08-05 ENCOUNTER — Encounter: Payer: Self-pay | Admitting: Internal Medicine

## 2015-08-05 VITALS — BP 110/68 | HR 63 | Temp 98.3°F | Resp 20 | Ht 68.0 in | Wt 192.6 lb

## 2015-08-05 DIAGNOSIS — E119 Type 2 diabetes mellitus without complications: Secondary | ICD-10-CM | POA: Diagnosis not present

## 2015-08-05 DIAGNOSIS — R740 Nonspecific elevation of levels of transaminase and lactic acid dehydrogenase [LDH]: Secondary | ICD-10-CM | POA: Diagnosis not present

## 2015-08-05 DIAGNOSIS — R053 Chronic cough: Secondary | ICD-10-CM

## 2015-08-05 DIAGNOSIS — R05 Cough: Secondary | ICD-10-CM | POA: Diagnosis not present

## 2015-08-05 DIAGNOSIS — R7401 Elevation of levels of liver transaminase levels: Secondary | ICD-10-CM

## 2015-08-05 DIAGNOSIS — Z794 Long term (current) use of insulin: Secondary | ICD-10-CM | POA: Diagnosis not present

## 2015-08-05 DIAGNOSIS — Z23 Encounter for immunization: Secondary | ICD-10-CM

## 2015-08-05 DIAGNOSIS — E785 Hyperlipidemia, unspecified: Secondary | ICD-10-CM | POA: Diagnosis not present

## 2015-08-05 DIAGNOSIS — I10 Essential (primary) hypertension: Secondary | ICD-10-CM | POA: Diagnosis not present

## 2015-08-05 NOTE — Patient Instructions (Signed)
Try over the counter cortisone cream for 2 weeks for the itching of your arms.  If it's not working, let me know.
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
## 2015-08-06 ENCOUNTER — Telehealth: Payer: Self-pay | Admitting: *Deleted

## 2015-08-06 NOTE — Telephone Encounter (Signed)
Ok, so he should be taking two puffs twice a day regularly.  He was not using it with a regular schedule so please review that with him.

## 2015-08-06 NOTE — Telephone Encounter (Signed)
Patient called and stated that you wanted him to call you with what inhaler he is on and it is Matthew Dominguez

## 2015-08-06 NOTE — Telephone Encounter (Signed)
Patient notified and understood.

## 2015-08-07 MED ORDER — IPRATROPIUM-ALBUTEROL 18-103 MCG/ACT IN AERO: 2.0000 | INHALATION_SPRAY | RESPIRATORY_TRACT | Status: DC | PRN

## 2015-08-08 ENCOUNTER — Other Ambulatory Visit: Payer: Self-pay | Admitting: *Deleted

## 2015-08-08 DIAGNOSIS — R05 Cough: Secondary | ICD-10-CM

## 2015-08-08 DIAGNOSIS — R053 Chronic cough: Secondary | ICD-10-CM

## 2015-08-08 MED ORDER — IPRATROPIUM-ALBUTEROL 18-103 MCG/ACT IN AERO: 2.0000 | INHALATION_SPRAY | RESPIRATORY_TRACT | Status: DC | PRN

## 2015-08-08 NOTE — Telephone Encounter (Signed)
[facility] 

## 2015-08-09 ENCOUNTER — Other Ambulatory Visit: Payer: Self-pay | Admitting: *Deleted

## 2015-08-09 DIAGNOSIS — R053 Chronic cough: Secondary | ICD-10-CM

## 2015-08-09 DIAGNOSIS — R05 Cough: Secondary | ICD-10-CM

## 2015-08-09 MED ORDER — IPRATROPIUM-ALBUTEROL 18-103 MCG/ACT IN AERO: 2.0000 | INHALATION_SPRAY | RESPIRATORY_TRACT | Status: DC | PRN

## 2015-08-09 NOTE — Telephone Encounter (Signed)
[employer]

## 2015-09-12 ENCOUNTER — Other Ambulatory Visit: Payer: Commercial Managed Care - HMO

## 2015-09-13 ENCOUNTER — Other Ambulatory Visit: Payer: Self-pay

## 2015-09-13 ENCOUNTER — Other Ambulatory Visit: Payer: Commercial Managed Care - HMO

## 2015-09-13 DIAGNOSIS — E119 Type 2 diabetes mellitus without complications: Secondary | ICD-10-CM

## 2015-09-13 MED ORDER — LOSARTAN POTASSIUM 50 MG PO TABS: 50.0000 mg | ORAL_TABLET | Freq: Every day | ORAL | Status: DC

## 2015-09-13 MED ORDER — TRAZODONE HCL 50 MG PO TABS: ORAL_TABLET | ORAL | Status: DC

## 2015-09-13 MED ORDER — METFORMIN HCL 1000 MG PO TABS: 1000.0000 mg | ORAL_TABLET | Freq: Two times a day (BID) | ORAL | Status: DC

## 2015-09-13 MED ORDER — LORAZEPAM 1 MG PO TABS: ORAL_TABLET | ORAL | Status: DC

## 2015-09-13 MED ORDER — BISOPROLOL FUMARATE 10 MG PO TABS: 10.0000 mg | ORAL_TABLET | Freq: Every day | ORAL | Status: DC

## 2015-09-15 LAB — COMPREHENSIVE METABOLIC PANEL
ALT: 56 IU/L — ABNORMAL HIGH (ref 0–44)
AST: 35 IU/L (ref 0–40)
Albumin/Globulin Ratio: 2.3 (ref 1.1–2.5)
Albumin: 4.5 g/dL (ref 3.6–4.8)
Alkaline Phosphatase: 43 IU/L (ref 39–117)
BUN/Creatinine Ratio: 11 (ref 10–22)
BUN: 11 mg/dL (ref 8–27)
Bilirubin Total: 0.3 mg/dL (ref 0.0–1.2)
CO2: 26 mmol/L (ref 18–29)
Calcium: 9.9 mg/dL (ref 8.6–10.2)
Chloride: 99 mmol/L (ref 96–106)
Creatinine, Ser: 0.98 mg/dL (ref 0.76–1.27)
GFR calc Af Amer: 92 mL/min/{1.73_m2} (ref >59)
GFR calc non Af Amer: 80 mL/min/{1.73_m2} (ref >59)
Globulin, Total: 2 g/dL (ref 1.5–4.5)
Glucose: 122 mg/dL — ABNORMAL HIGH (ref 65–99)
Potassium: 4.2 mmol/L (ref 3.5–5.2)
Sodium: 141 mmol/L (ref 134–144)
Total Protein: 6.5 g/dL (ref 6.0–8.5)

## 2015-09-15 LAB — HEMOGLOBIN A1C
Est. average glucose Bld gHb Est-mCnc: 151 mg/dL
Hgb A1c MFr Bld: 6.9 % — ABNORMAL HIGH (ref 4.8–5.6)

## 2015-09-15 LAB — MICROALBUMIN / CREATININE URINE RATIO
Creatinine, Urine: 62.4 mg/dL
MICROALB/CREAT RATIO: 14.6 mg/g creat (ref 0.0–30.0)
Microalbumin, Urine: 9.1 ug/mL

## 2015-09-16 ENCOUNTER — Ambulatory Visit: Payer: Commercial Managed Care - HMO | Admitting: Pharmacotherapy

## 2015-09-23 ENCOUNTER — Other Ambulatory Visit: Payer: Self-pay | Admitting: *Deleted

## 2015-09-23 ENCOUNTER — Ambulatory Visit: Payer: Commercial Managed Care - HMO | Admitting: Pharmacotherapy

## 2015-09-23 MED ORDER — FENOFIBRATE 145 MG PO TABS
145.0000 mg | ORAL_TABLET | Freq: Every day | ORAL | Status: DC
Start: 2015-09-23 — End: 2016-10-25

## 2015-09-23 MED ORDER — BISOPROLOL FUMARATE 10 MG PO TABS: 10.0000 mg | ORAL_TABLET | Freq: Every day | ORAL | Status: DC

## 2015-09-23 NOTE — Telephone Encounter (Signed)
Patient requested to be sent to pharmacy with #[phone]

## 2015-09-24 ENCOUNTER — Other Ambulatory Visit: Payer: Self-pay | Admitting: *Deleted

## 2015-09-24 MED ORDER — BISOPROLOL FUMARATE 10 MG PO TABS: 10.0000 mg | ORAL_TABLET | Freq: Every day | ORAL | Status: DC

## 2015-09-24 NOTE — Telephone Encounter (Signed)
Pharmacy did not received refill request. Refaxed.

## 2015-09-27 ENCOUNTER — Other Ambulatory Visit: Payer: Self-pay | Admitting: *Deleted

## 2015-09-27 DIAGNOSIS — J432 Centrilobular emphysema: Secondary | ICD-10-CM

## 2015-09-27 MED ORDER — METFORMIN HCL 1000 MG PO TABS: 1000.0000 mg | ORAL_TABLET | Freq: Two times a day (BID) | ORAL | Status: DC

## 2015-09-27 MED ORDER — TRAZODONE HCL 50 MG PO TABS: ORAL_TABLET | ORAL | Status: DC

## 2015-09-27 MED ORDER — BUDESONIDE-FORMOTEROL FUMARATE 80-4.5 MCG/ACT IN AERO: INHALATION_SPRAY | RESPIRATORY_TRACT | Status: DC

## 2015-09-27 NOTE — Telephone Encounter (Signed)
Patient requested to be sent to pharmacy.  

## 2015-10-07 ENCOUNTER — Encounter: Payer: Self-pay | Admitting: Pharmacotherapy

## 2015-10-07 ENCOUNTER — Ambulatory Visit (INDEPENDENT_AMBULATORY_CARE_PROVIDER_SITE_OTHER): Payer: Commercial Managed Care - HMO | Admitting: Pharmacotherapy

## 2015-10-07 VITALS — BP 130/78 | HR 74 | Temp 97.8°F | Resp 20 | Ht 68.0 in | Wt 192.2 lb

## 2015-10-07 DIAGNOSIS — I10 Essential (primary) hypertension: Secondary | ICD-10-CM

## 2015-10-07 DIAGNOSIS — Z794 Long term (current) use of insulin: Secondary | ICD-10-CM

## 2015-10-07 DIAGNOSIS — E119 Type 2 diabetes mellitus without complications: Secondary | ICD-10-CM | POA: Diagnosis not present

## 2015-10-07 DIAGNOSIS — IMO0001 Reserved for inherently not codable concepts without codable children: Secondary | ICD-10-CM

## 2015-10-07 DIAGNOSIS — E1165 Type 2 diabetes mellitus with hyperglycemia: Secondary | ICD-10-CM | POA: Diagnosis not present

## 2015-10-07 MED ORDER — INSULIN REGULAR HUMAN 100 UNIT/ML IJ SOLN: 12.0000 [IU] | Freq: Three times a day (TID) | INTRAMUSCULAR | Status: DC

## 2015-10-07 NOTE — Progress Notes (Signed)
  Subjective:    Matthew Dominguez is a 65 y.o.white male who presents for follow-up of Type 2 diabetes mellitus.   A1C is 6.9% Now sporting a full beard.  Forgot to bring blood glucose meter. BG: 75-130 No hypoglycemia.  Struggles with making healthy food choices.  Still skips meals on occasion. Some exercise.  Walks "a little bit" Has blurry vision.  Last eye exam a couple of years ago. Denies problems with feet. Nocturia 3-4 times per night. Denies peripheral edema.  Review of Systems A comprehensive review of systems was negative except for: Eyes: positive for blurry vision Genitourinary: positive for nocturia    Objective:    BP 130/78 mmHg  Pulse 74  Temp(Src) 97.8 F (36.6 C) (Oral)  Resp 20  Ht 5\' 8"  (1.727 m)  Wt 192 lb 3.2 oz (87.181 kg)  BMI 29.23 kg/m2  SpO2 94%  General:  alert, cooperative and no distress  Oropharynx: normal findings: lips normal without lesions and gums healthy   Eyes:  negative findings: lids and lashes normal and conjunctivae and sclerae normal   Ears:  external ears normal        Lung: clear to auscultation bilaterally  Heart:  regular rate and rhythm     Extremities: extremities normal, atraumatic, no cyanosis or edema  Skin: warm and dry, no hyperpigmentation, vitiligo, or suspicious lesions     Neuro: mental status, speech normal, alert and oriented x3 and gait and station normal   Lab Review GLUCOSE (mg/dL)  Date Value  09/13/2015 122*  05/02/2015 63*  12/20/2014 155*   GLUCOSE, BLD (mg/dL)  Date Value  11/22/2012 121*   CO2 (mmol/L)  Date Value  09/13/2015 26  05/02/2015 27  12/20/2014 26   BUN (mg/dL)  Date Value  09/13/2015 11  05/02/2015 12  12/20/2014 10  11/22/2012 14   CREATININE, SER (mg/dL)  Date Value  09/13/2015 0.98  05/02/2015 0.96  12/20/2014 1.03       Assessment:    Diabetes Mellitus type II, under excellent control. A1C is at goal <7% BP at goal <140/90   Plan:    1.  Rx changes:  none 2.  Continue Lantus 50 units daily. 3.  Continue Novolin R 12 units with each meal.  Refill sent to [facility]. 4.  Continue Metformin 1000mg  BID 5.  Counseled on nutrition goals. 6.  Exercise goal is 30-45 minutes 5 x week. 7.  BP at goal on current RX. 8.  Insurance asking about statin use due to risk factors.  Will check FLP prior to next OV.

## 2015-10-07 NOTE — Patient Instructions (Signed)
Keep up the good work

## 2015-11-06 DIAGNOSIS — S2249XA Multiple fractures of ribs, unspecified side, initial encounter for closed fracture: Secondary | ICD-10-CM

## 2015-11-06 HISTORY — DX: Multiple fractures of ribs, unspecified side, initial encounter for closed fracture: S22.49XA

## 2015-11-13 ENCOUNTER — Encounter: Payer: Self-pay | Admitting: Pharmacotherapy

## 2015-11-29 ENCOUNTER — Ambulatory Visit
Admission: RE | Admit: 2015-11-29 | Discharge: 2015-11-29 | Disposition: A | Discharge status: Home / Self Care | Payer: Commercial Managed Care - HMO | Source: Ambulatory Visit | Attending: Internal Medicine | Admitting: Internal Medicine

## 2015-11-29 ENCOUNTER — Telehealth: Payer: Self-pay | Admitting: *Deleted

## 2015-11-29 ENCOUNTER — Other Ambulatory Visit: Payer: Self-pay | Admitting: Internal Medicine

## 2015-11-29 DIAGNOSIS — R079 Chest pain, unspecified: Secondary | ICD-10-CM | POA: Diagnosis not present

## 2015-11-29 DIAGNOSIS — Z9181 History of falling: Secondary | ICD-10-CM

## 2015-11-29 DIAGNOSIS — S2241XA Multiple fractures of ribs, right side, initial encounter for closed fracture: Secondary | ICD-10-CM | POA: Diagnosis not present

## 2015-11-29 DIAGNOSIS — R0781 Pleurodynia: Secondary | ICD-10-CM

## 2015-11-29 NOTE — Telephone Encounter (Signed)
Patient called and stated that he fell and thinks he has broken ribs. Patient made an appointment with you on Monday to be evaluated but wants to know if he can go do some x-rays today so you will have them on Monday to review with him. Please Advise.

## 2015-11-29 NOTE — Telephone Encounter (Signed)
Patient notified and will go. Placed order. Patient stated that he has been constipated. Is going to try some warm prune juice for relief.

## 2015-11-29 NOTE — Telephone Encounter (Signed)
Chest xray with rib films He should use ice on the affected area for 20 min intervals three times a day and may take tylenol 1000mg  twice a day for the pain until I see him

## 2015-12-02 ENCOUNTER — Encounter: Payer: Self-pay | Admitting: Internal Medicine

## 2015-12-02 ENCOUNTER — Ambulatory Visit (INDEPENDENT_AMBULATORY_CARE_PROVIDER_SITE_OTHER): Payer: Commercial Managed Care - HMO | Admitting: Internal Medicine

## 2015-12-02 VITALS — BP 158/70 | HR 97 | Temp 98.7°F

## 2015-12-02 DIAGNOSIS — W101XXA Fall (on)(from) sidewalk curb, initial encounter: Secondary | ICD-10-CM | POA: Diagnosis not present

## 2015-12-02 DIAGNOSIS — I10 Essential (primary) hypertension: Secondary | ICD-10-CM

## 2015-12-02 DIAGNOSIS — J90 Pleural effusion, not elsewhere classified: Secondary | ICD-10-CM

## 2015-12-02 DIAGNOSIS — S2231XA Fracture of one rib, right side, initial encounter for closed fracture: Secondary | ICD-10-CM

## 2015-12-02 DIAGNOSIS — J948 Other specified pleural conditions: Secondary | ICD-10-CM

## 2015-12-02 MED ORDER — TAMSULOSIN HCL 0.4 MG PO CAPS: 0.4000 mg | ORAL_CAPSULE | Freq: Every day | ORAL | Status: DC

## 2015-12-02 MED ORDER — LOSARTAN POTASSIUM 50 MG PO TABS: 50.0000 mg | ORAL_TABLET | Freq: Every day | ORAL | Status: DC

## 2015-12-02 MED ORDER — GLUCOSE BLOOD VI STRP: ORAL_STRIP | Status: DC

## 2015-12-02 MED ORDER — OXYCODONE-ACETAMINOPHEN 5-325 MG PO TABS: 1.0000 | ORAL_TABLET | ORAL | Status: DC | PRN
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
## 2015-12-03 ENCOUNTER — Other Ambulatory Visit: Payer: Self-pay | Admitting: Pharmacotherapy

## 2015-12-09 ENCOUNTER — Ambulatory Visit (INDEPENDENT_AMBULATORY_CARE_PROVIDER_SITE_OTHER): Payer: Commercial Managed Care - HMO | Admitting: Internal Medicine

## 2015-12-09 ENCOUNTER — Encounter: Payer: Self-pay | Admitting: Internal Medicine

## 2015-12-09 VITALS — BP 136/74 | HR 69 | Temp 97.9°F | Resp 12 | Ht 67.5 in | Wt 189.0 lb

## 2015-12-09 DIAGNOSIS — G3184 Mild cognitive impairment, so stated: Secondary | ICD-10-CM | POA: Diagnosis not present

## 2015-12-09 DIAGNOSIS — S2241XD Multiple fractures of ribs, right side, subsequent encounter for fracture with routine healing: Secondary | ICD-10-CM | POA: Diagnosis not present

## 2015-12-09 DIAGNOSIS — Z0000 Encounter for general adult medical examination without abnormal findings: Secondary | ICD-10-CM

## 2015-12-09 DIAGNOSIS — I10 Essential (primary) hypertension: Secondary | ICD-10-CM

## 2015-12-09 DIAGNOSIS — I452 Bifascicular block: Secondary | ICD-10-CM

## 2015-12-09 DIAGNOSIS — J948 Other specified pleural conditions: Secondary | ICD-10-CM

## 2015-12-09 DIAGNOSIS — J90 Pleural effusion, not elsewhere classified: Secondary | ICD-10-CM

## 2015-12-09 DIAGNOSIS — E785 Hyperlipidemia, unspecified: Secondary | ICD-10-CM | POA: Diagnosis not present

## 2015-12-09 MED ORDER — ZOSTER VACCINE LIVE 19400 UNT/0.65ML ~~LOC~~ SOLR: 0.6500 mL | Freq: Once | SUBCUTANEOUS | Status: DC

## 2015-12-09 NOTE — Patient Instructions (Addendum)
Take your 2 colace each time you take the percocet.    Got get a repeat chest xray at [facility] in 2 weeks.
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
## 2015-12-20 ENCOUNTER — Other Ambulatory Visit: Payer: Self-pay | Admitting: Internal Medicine

## 2015-12-20 DIAGNOSIS — S2241XD Multiple fractures of ribs, right side, subsequent encounter for fracture with routine healing: Secondary | ICD-10-CM

## 2015-12-20 DIAGNOSIS — S2249XA Multiple fractures of ribs, unspecified side, initial encounter for closed fracture: Secondary | ICD-10-CM | POA: Insufficient documentation

## 2015-12-20 DIAGNOSIS — J90 Pleural effusion, not elsewhere classified: Secondary | ICD-10-CM

## 2015-12-23 ENCOUNTER — Ambulatory Visit
Admission: RE | Admit: 2015-12-23 | Discharge: 2015-12-23 | Disposition: A | Discharge status: Home / Self Care | Payer: Commercial Managed Care - HMO | Source: Ambulatory Visit | Attending: Internal Medicine | Admitting: Internal Medicine

## 2015-12-23 DIAGNOSIS — J90 Pleural effusion, not elsewhere classified: Secondary | ICD-10-CM

## 2015-12-23 DIAGNOSIS — S2241XD Multiple fractures of ribs, right side, subsequent encounter for fracture with routine healing: Secondary | ICD-10-CM

## 2015-12-24 ENCOUNTER — Other Ambulatory Visit: Payer: Self-pay | Admitting: Internal Medicine

## 2016-01-15 ENCOUNTER — Other Ambulatory Visit: Payer: Commercial Managed Care - HMO

## 2016-01-15 DIAGNOSIS — Z794 Long term (current) use of insulin: Principal | ICD-10-CM

## 2016-01-15 DIAGNOSIS — E119 Type 2 diabetes mellitus without complications: Secondary | ICD-10-CM | POA: Diagnosis not present

## 2016-01-16 LAB — COMPREHENSIVE METABOLIC PANEL
ALT: 47 IU/L — ABNORMAL HIGH (ref 0–44)
AST: 34 IU/L (ref 0–40)
Albumin/Globulin Ratio: 2 (ref 1.2–2.2)
Albumin: 4.1 g/dL (ref 3.6–4.8)
Alkaline Phosphatase: 42 IU/L (ref 39–117)
BUN/Creatinine Ratio: 11 (ref 10–24)
BUN: 10 mg/dL (ref 8–27)
Bilirubin Total: 0.3 mg/dL (ref 0.0–1.2)
CO2: 25 mmol/L (ref 18–29)
Calcium: 9.4 mg/dL (ref 8.6–10.2)
Chloride: 100 mmol/L (ref 96–106)
Creatinine, Ser: 0.89 mg/dL (ref 0.76–1.27)
GFR calc Af Amer: 102 mL/min/{1.73_m2} (ref >59)
GFR calc non Af Amer: 88 mL/min/{1.73_m2} (ref >59)
Globulin, Total: 2.1 g/dL (ref 1.5–4.5)
Glucose: 109 mg/dL — ABNORMAL HIGH (ref 65–99)
Potassium: 4.1 mmol/L (ref 3.5–5.2)
Sodium: 142 mmol/L (ref 134–144)
Total Protein: 6.2 g/dL (ref 6.0–8.5)

## 2016-01-16 LAB — LIPID PANEL
Chol/HDL Ratio: 3.2 ratio units (ref 0.0–5.0)
Cholesterol, Total: 149 mg/dL (ref 100–199)
HDL: 47 mg/dL (ref >39)
LDL Calculated: 77 mg/dL (ref 0–99)
Triglycerides: 126 mg/dL (ref 0–149)
VLDL Cholesterol Cal: 25 mg/dL (ref 5–40)

## 2016-01-16 LAB — HEMOGLOBIN A1C
Est. average glucose Bld gHb Est-mCnc: 151 mg/dL
Hgb A1c MFr Bld: 6.9 % — ABNORMAL HIGH (ref 4.8–5.6)

## 2016-01-20 ENCOUNTER — Ambulatory Visit (INDEPENDENT_AMBULATORY_CARE_PROVIDER_SITE_OTHER): Payer: Commercial Managed Care - HMO | Admitting: Pharmacotherapy

## 2016-01-20 ENCOUNTER — Encounter: Payer: Self-pay | Admitting: Pharmacotherapy

## 2016-01-20 VITALS — BP 130/72 | HR 77 | Temp 98.1°F | Ht 68.0 in | Wt 188.6 lb

## 2016-01-20 DIAGNOSIS — I10 Essential (primary) hypertension: Secondary | ICD-10-CM

## 2016-01-20 DIAGNOSIS — Z794 Long term (current) use of insulin: Secondary | ICD-10-CM

## 2016-01-20 DIAGNOSIS — E119 Type 2 diabetes mellitus without complications: Secondary | ICD-10-CM

## 2016-01-20 NOTE — Patient Instructions (Signed)
Keep medications the same.

## 2016-01-20 NOTE — Progress Notes (Signed)
  Subjective:    Matthew Dominguez is a 65 y.o.white male who presents for follow-up of Type 2 diabetes mellitus.   A1C remains excellent at 6.9% However, having more episodes of confusion and a recent fall.  Fractured ribs.  He is taking Lantus, Novolin R, and metformin.  Forgot to bring meter. Denies hypoglycemia.  Says lowest BG: 70mg /dl He reports average BG:  120mg /dl  Continues dip snuff. Makes healthy food choices. Walking for exercise.  1/2 mile per day. 20 minutes. Denies problems with vision Denies problems with feet No edema. Nocturia 3-4 times per night.  He refuses to wear CGM.   Review of Systems A comprehensive review of systems was negative except for: Genitourinary: positive for nocturia    Objective:    BP 130/72 mmHg  Pulse 77  Temp(Src) 98.1 F (36.7 C) (Oral)  Ht 5\' 8"  (1.727 m)  Wt 188 lb 9.6 oz (85.548 kg)  BMI 28.68 kg/m2  SpO2 95%  General:  alert, cooperative and appears stated age  Oropharynx: normal findings: lips normal without lesions and gums healthy   Eyes:  negative findings: lids and lashes normal and conjunctivae and sclerae normal   Ears:  external ears normal        Lung: clear to auscultation bilaterally  Heart:  regular rate and rhythm     Extremities: no edema, redness or tenderness in the calves or thighs  Skin: warm and dry, no hyperpigmentation, vitiligo, or suspicious lesions     Neuro: mental status, speech normal, alert and oriented x3 and gait and station normal   Lab Review GLUCOSE (mg/dL)  Date Value  01/15/2016 109*  09/13/2015 122*  05/02/2015 63*   GLUCOSE, BLD (mg/dL)  Date Value  11/22/2012 121*   CO2 (mmol/L)  Date Value  01/15/2016 25  09/13/2015 26  05/02/2015 27   BUN (mg/dL)  Date Value  01/15/2016 10  09/13/2015 11  05/02/2015 12  11/22/2012 14   CREATININE, SER (mg/dL)  Date Value  01/15/2016 0.89  09/13/2015 0.98  05/02/2015 0.96       Assessment:    Diabetes Mellitus type  II, under excellent control. A1C at goal <7%.  No evidence of hypoglycemia BP at goal <140/90   Plan:    1.  Rx changes: none  2.  Continue Lantus 50 units daily. 3.  Continue Novolin R 12 units with each meal. 4.  Continue Metformin 1000mg  BID. 5.  Counseled on nutrition goals. 6.  Praised exercise efforts.  Goal is 30-45 minutes 5 x week. 7.  Patient refused to wear continuous glucose monitor. 8.  BP at goal <140/90

## 2016-01-22 ENCOUNTER — Other Ambulatory Visit: Payer: Commercial Managed Care - HMO

## 2016-01-27 ENCOUNTER — Ambulatory Visit: Payer: Commercial Managed Care - HMO | Admitting: Pharmacotherapy

## 2016-02-21 ENCOUNTER — Other Ambulatory Visit: Payer: Self-pay | Admitting: Internal Medicine

## 2016-03-09 ENCOUNTER — Encounter: Payer: Self-pay | Admitting: Internal Medicine

## 2016-03-09 ENCOUNTER — Ambulatory Visit (INDEPENDENT_AMBULATORY_CARE_PROVIDER_SITE_OTHER): Payer: Commercial Managed Care - HMO | Admitting: Internal Medicine

## 2016-03-09 VITALS — BP 164/82 | HR 74 | Temp 98.2°F | Ht 68.0 in | Wt 187.0 lb

## 2016-03-09 DIAGNOSIS — J449 Chronic obstructive pulmonary disease, unspecified: Secondary | ICD-10-CM

## 2016-03-09 DIAGNOSIS — E119 Type 2 diabetes mellitus without complications: Secondary | ICD-10-CM | POA: Diagnosis not present

## 2016-03-09 DIAGNOSIS — J90 Pleural effusion, not elsewhere classified: Secondary | ICD-10-CM

## 2016-03-09 DIAGNOSIS — J948 Other specified pleural conditions: Secondary | ICD-10-CM

## 2016-03-09 DIAGNOSIS — E785 Hyperlipidemia, unspecified: Secondary | ICD-10-CM

## 2016-03-09 DIAGNOSIS — S2241XD Multiple fractures of ribs, right side, subsequent encounter for fracture with routine healing: Secondary | ICD-10-CM | POA: Diagnosis not present

## 2016-03-09 DIAGNOSIS — N400 Enlarged prostate without lower urinary tract symptoms: Secondary | ICD-10-CM | POA: Diagnosis not present

## 2016-03-09 DIAGNOSIS — G4700 Insomnia, unspecified: Secondary | ICD-10-CM

## 2016-03-09 DIAGNOSIS — I10 Essential (primary) hypertension: Secondary | ICD-10-CM

## 2016-03-09 DIAGNOSIS — Z794 Long term (current) use of insulin: Secondary | ICD-10-CM

## 2016-03-09 MED ORDER — SUVOREXANT 10 MG PO TABS: 10.0000 mg | ORAL_TABLET | Freq: Every evening | ORAL | Status: DC | PRN
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
## 2016-04-24 NOTE — Addendum Note (Signed)
Addended by: Moshe Cipro Bluford Sedler A on: 04/24/2016 03:48 PM   Modules accepted: Orders

## 2016-05-11 ENCOUNTER — Other Ambulatory Visit: Payer: Self-pay | Admitting: Internal Medicine

## 2016-05-20 ENCOUNTER — Other Ambulatory Visit: Payer: Commercial Managed Care - HMO

## 2016-05-20 DIAGNOSIS — Z794 Long term (current) use of insulin: Principal | ICD-10-CM

## 2016-05-20 DIAGNOSIS — E119 Type 2 diabetes mellitus without complications: Secondary | ICD-10-CM

## 2016-05-20 LAB — COMPREHENSIVE METABOLIC PANEL
ALT: 88 U/L — ABNORMAL HIGH (ref 9–46)
AST: 52 U/L — ABNORMAL HIGH (ref 10–35)
Albumin: 4.2 g/dL (ref 3.6–5.1)
Alkaline Phosphatase: 48 U/L (ref 40–115)
BUN: 12 mg/dL (ref 7–25)
CO2: 27 mmol/L (ref 20–31)
Calcium: 9.8 mg/dL (ref 8.6–10.3)
Chloride: 105 mmol/L (ref 98–110)
Creat: 0.89 mg/dL (ref 0.70–1.25)
Glucose, Bld: 133 mg/dL — ABNORMAL HIGH (ref 65–99)
Potassium: 4.4 mmol/L (ref 3.5–5.3)
Sodium: 140 mmol/L (ref 135–146)
Total Bilirubin: 0.5 mg/dL (ref 0.2–1.2)
Total Protein: 6.6 g/dL (ref 6.1–8.1)

## 2016-05-21 LAB — HEMOGLOBIN A1C
Hgb A1c MFr Bld: 6.6 % — ABNORMAL HIGH (ref <5.7)
Mean Plasma Glucose: 143 mg/dL

## 2016-05-25 ENCOUNTER — Ambulatory Visit (INDEPENDENT_AMBULATORY_CARE_PROVIDER_SITE_OTHER): Payer: Commercial Managed Care - HMO | Admitting: Pharmacotherapy

## 2016-05-25 ENCOUNTER — Encounter: Payer: Self-pay | Admitting: Pharmacotherapy

## 2016-05-25 VITALS — BP 122/70 | HR 60 | Resp 12 | Ht 68.0 in | Wt 191.0 lb

## 2016-05-25 DIAGNOSIS — Z794 Long term (current) use of insulin: Secondary | ICD-10-CM | POA: Diagnosis not present

## 2016-05-25 DIAGNOSIS — I10 Essential (primary) hypertension: Secondary | ICD-10-CM | POA: Diagnosis not present

## 2016-05-25 DIAGNOSIS — Z23 Encounter for immunization: Secondary | ICD-10-CM | POA: Diagnosis not present

## 2016-05-25 DIAGNOSIS — E119 Type 2 diabetes mellitus without complications: Secondary | ICD-10-CM | POA: Diagnosis not present

## 2016-05-25 NOTE — Patient Instructions (Signed)
Keep up the good work! Try to get 30-45 minutes of exercise 5 x week.

## 2016-05-25 NOTE — Progress Notes (Signed)
  Subjective:    Matthew Dominguez is a 65 y.o.white male who presents for follow-up of Type 2 diabetes mellitus.   A1C: 6.6% (was 6.9%).  Forgot to bring blood glucose meter. He reports BG: 95-140 No hypoglycemia  Struggles with healthy food choices. On Lantus 50 units daily and Novolog 12 units with meals.  Also takes metformin. No routine exercise.  Denies problems with feet. No peripheral edema. Has blurry vision.   Nocturia "too often" No dysuria. Staying well hydrated during the day.   Review of Systems A comprehensive review of systems was negative except for: Eyes: positive for blurry vision Genitourinary: positive for nocturia    Objective:    BP 122/70   Pulse 60   Resp 12   Ht 5' 8"$  (1.727 m)   Wt 191 lb (86.6 kg)   BMI 29.04 kg/m   General:  alert, cooperative and no distress  Oropharynx: normal findings: lips normal without lesions and gums healthy   Eyes:  negative findings: lids and lashes normal and conjunctivae and sclerae normal   Ears:  external ears normal        Lung: clear to auscultation bilaterally  Heart:  regular rate and rhythm     Extremities: extremities normal, atraumatic, no cyanosis or edema  Skin: warm and dry, no hyperpigmentation, vitiligo, or suspicious lesions     Neuro: mental status, speech normal, alert and oriented x3 and gait and station normal   Lab Review Glucose (mg/dL)  Date Value  01/15/2016 109 (H)  09/13/2015 122 (H)  05/02/2015 63 (L)   Glucose, Bld (mg/dL)  Date Value  05/20/2016 133 (H)  11/22/2012 121 (H)   CO2 (mmol/L)  Date Value  05/20/2016 27  01/15/2016 25  09/13/2015 26   BUN (mg/dL)  Date Value  05/20/2016 12  01/15/2016 10  09/13/2015 11  05/02/2015 12   Creat (mg/dL)  Date Value  05/20/2016 0.89   Creatinine, Ser (mg/dL)  Date Value  01/15/2016 0.89  09/13/2015 0.98  05/02/2015 0.96       Assessment:    Diabetes Mellitus type II, under excellent control. A1C at goal <7%  without hypoglycemia. BP at goal <140/90   Plan:    1.  Rx changes: none  2.  Continue Lantus 50 units daily. 3.  Continue Novolog 12 units with meals. 4.  Continue Metformin. 5.  Counseled on nutrition goals. 6.  Counseled on need for routine exercise.  Goal is 30-45 minutes 5 x week. 7.  BP at goal <140/90.

## 2016-06-11 ENCOUNTER — Ambulatory Visit: Payer: Commercial Managed Care - HMO | Admitting: Internal Medicine

## 2016-06-22 ENCOUNTER — Ambulatory Visit (INDEPENDENT_AMBULATORY_CARE_PROVIDER_SITE_OTHER): Payer: Commercial Managed Care - HMO | Admitting: Internal Medicine

## 2016-06-22 ENCOUNTER — Telehealth: Payer: Self-pay | Admitting: *Deleted

## 2016-06-22 ENCOUNTER — Encounter: Payer: Self-pay | Admitting: Internal Medicine

## 2016-06-22 VITALS — BP 120/60 | HR 86 | Temp 97.9°F | Ht 68.0 in | Wt 191.0 lb

## 2016-06-22 DIAGNOSIS — R350 Frequency of micturition: Secondary | ICD-10-CM | POA: Diagnosis not present

## 2016-06-22 DIAGNOSIS — N401 Enlarged prostate with lower urinary tract symptoms: Secondary | ICD-10-CM | POA: Diagnosis not present

## 2016-06-22 DIAGNOSIS — I10 Essential (primary) hypertension: Secondary | ICD-10-CM

## 2016-06-22 DIAGNOSIS — E119 Type 2 diabetes mellitus without complications: Secondary | ICD-10-CM

## 2016-06-22 DIAGNOSIS — Z794 Long term (current) use of insulin: Secondary | ICD-10-CM | POA: Diagnosis not present

## 2016-06-22 DIAGNOSIS — J449 Chronic obstructive pulmonary disease, unspecified: Secondary | ICD-10-CM | POA: Diagnosis not present

## 2016-06-22 DIAGNOSIS — G4700 Insomnia, unspecified: Secondary | ICD-10-CM

## 2016-06-22 MED ORDER — SUVOREXANT 20 MG PO TABS
20.0000 mg | ORAL_TABLET | Freq: Every evening | ORAL | 3 refills | Status: DC | PRN
Start: 2016-06-22 — End: 2016-10-09

## 2016-06-22 NOTE — Telephone Encounter (Signed)
Opened in error
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
## 2016-07-20 DIAGNOSIS — H2513 Age-related nuclear cataract, bilateral: Secondary | ICD-10-CM | POA: Diagnosis not present

## 2016-07-20 DIAGNOSIS — H5501 Congenital nystagmus: Secondary | ICD-10-CM | POA: Diagnosis not present

## 2016-07-20 DIAGNOSIS — H5203 Hypermetropia, bilateral: Secondary | ICD-10-CM | POA: Diagnosis not present

## 2016-07-20 DIAGNOSIS — H52223 Regular astigmatism, bilateral: Secondary | ICD-10-CM | POA: Diagnosis not present

## 2016-07-20 DIAGNOSIS — E113293 Type 2 diabetes mellitus with mild nonproliferative diabetic retinopathy without macular edema, bilateral: Secondary | ICD-10-CM | POA: Diagnosis not present

## 2016-07-20 DIAGNOSIS — H524 Presbyopia: Secondary | ICD-10-CM | POA: Diagnosis not present

## 2016-07-20 DIAGNOSIS — E119 Type 2 diabetes mellitus without complications: Secondary | ICD-10-CM | POA: Diagnosis not present

## 2016-07-20 LAB — HM DIABETES EYE EXAM

## 2016-07-21 ENCOUNTER — Telehealth: Payer: Self-pay | Admitting: *Deleted

## 2016-07-21 DIAGNOSIS — H259 Unspecified age-related cataract: Secondary | ICD-10-CM

## 2016-07-21 DIAGNOSIS — Z794 Long term (current) use of insulin: Secondary | ICD-10-CM

## 2016-07-21 DIAGNOSIS — E119 Type 2 diabetes mellitus without complications: Secondary | ICD-10-CM

## 2016-07-21 NOTE — Telephone Encounter (Signed)
Patient notified that referral was placed

## 2016-07-21 NOTE — Telephone Encounter (Signed)
Patient wife called and stated that patient needs a referral to Dr. Darleen Crocker ([place]) [phone] for Cataract Surgery. Please Advise.

## 2016-07-28 ENCOUNTER — Encounter: Payer: Self-pay | Admitting: Nurse Practitioner

## 2016-07-28 ENCOUNTER — Ambulatory Visit (INDEPENDENT_AMBULATORY_CARE_PROVIDER_SITE_OTHER): Payer: Commercial Managed Care - HMO | Admitting: Nurse Practitioner

## 2016-07-28 ENCOUNTER — Ambulatory Visit
Admission: RE | Admit: 2016-07-28 | Discharge: 2016-07-28 | Disposition: A | Discharge status: Home / Self Care | Payer: Commercial Managed Care - HMO | Source: Ambulatory Visit | Attending: Nurse Practitioner | Admitting: Nurse Practitioner

## 2016-07-28 DIAGNOSIS — Z9189 Other specified personal risk factors, not elsewhere classified: Secondary | ICD-10-CM | POA: Diagnosis not present

## 2016-07-28 DIAGNOSIS — M25512 Pain in left shoulder: Secondary | ICD-10-CM

## 2016-07-28 DIAGNOSIS — K5903 Drug induced constipation: Secondary | ICD-10-CM

## 2016-07-28 DIAGNOSIS — R0789 Other chest pain: Secondary | ICD-10-CM

## 2016-07-28 DIAGNOSIS — S299XXA Unspecified injury of thorax, initial encounter: Secondary | ICD-10-CM | POA: Diagnosis not present

## 2016-07-28 DIAGNOSIS — R079 Chest pain, unspecified: Secondary | ICD-10-CM | POA: Diagnosis not present

## 2016-07-28 DIAGNOSIS — M542 Cervicalgia: Secondary | ICD-10-CM | POA: Diagnosis not present

## 2016-07-28 MED ORDER — METHOCARBAMOL 500 MG PO TABS: ORAL_TABLET | ORAL | 0 refills | Status: DC

## 2016-07-28 MED ORDER — NAPROXEN 500 MG PO TABS: ORAL_TABLET | ORAL | 0 refills | Status: DC

## 2016-07-28 NOTE — Patient Instructions (Addendum)
X-Rays of the chest and back.  Salonpas as needed. May also use biofreeze, bengay, or icyhot.   Apply heat or ice as needed. Heat should be used prior to [employer] or any muscle rubs.  Miralax daily for constipation. Drink more water!  To avoid adverse reactions, make sure you take your medications as prescribed.
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
## 2016-08-04 ENCOUNTER — Other Ambulatory Visit: Payer: Self-pay | Admitting: Internal Medicine

## 2016-08-13 ENCOUNTER — Other Ambulatory Visit: Payer: Self-pay | Admitting: Internal Medicine

## 2016-09-01 DIAGNOSIS — H18413 Arcus senilis, bilateral: Secondary | ICD-10-CM | POA: Diagnosis not present

## 2016-09-01 DIAGNOSIS — H02839 Dermatochalasis of unspecified eye, unspecified eyelid: Secondary | ICD-10-CM | POA: Diagnosis not present

## 2016-09-01 DIAGNOSIS — H2513 Age-related nuclear cataract, bilateral: Secondary | ICD-10-CM | POA: Diagnosis not present

## 2016-09-01 DIAGNOSIS — H2512 Age-related nuclear cataract, left eye: Secondary | ICD-10-CM | POA: Diagnosis not present

## 2016-09-01 DIAGNOSIS — H25013 Cortical age-related cataract, bilateral: Secondary | ICD-10-CM | POA: Diagnosis not present

## 2016-09-13 ENCOUNTER — Other Ambulatory Visit: Payer: Self-pay | Admitting: Internal Medicine

## 2016-09-13 DIAGNOSIS — J432 Centrilobular emphysema: Secondary | ICD-10-CM

## 2016-09-24 ENCOUNTER — Ambulatory Visit: Payer: Commercial Managed Care - HMO | Admitting: Internal Medicine

## 2016-09-25 ENCOUNTER — Other Ambulatory Visit: Payer: Self-pay | Admitting: Nurse Practitioner

## 2016-09-25 DIAGNOSIS — M25512 Pain in left shoulder: Secondary | ICD-10-CM

## 2016-09-25 DIAGNOSIS — R0789 Other chest pain: Secondary | ICD-10-CM

## 2016-10-08 HISTORY — PX: CATARACT EXTRACTION, BILATERAL: SHX1313

## 2016-10-09 ENCOUNTER — Encounter: Payer: Self-pay | Admitting: Internal Medicine

## 2016-10-09 ENCOUNTER — Other Ambulatory Visit: Payer: Self-pay | Admitting: Internal Medicine

## 2016-10-09 ENCOUNTER — Ambulatory Visit (INDEPENDENT_AMBULATORY_CARE_PROVIDER_SITE_OTHER): Payer: Medicare HMO | Admitting: Internal Medicine

## 2016-10-09 VITALS — BP 138/70 | HR 71 | Temp 98.4°F | Ht 68.0 in | Wt 188.0 lb

## 2016-10-09 DIAGNOSIS — J449 Chronic obstructive pulmonary disease, unspecified: Secondary | ICD-10-CM | POA: Diagnosis not present

## 2016-10-09 DIAGNOSIS — I10 Essential (primary) hypertension: Secondary | ICD-10-CM

## 2016-10-09 DIAGNOSIS — E119 Type 2 diabetes mellitus without complications: Secondary | ICD-10-CM | POA: Diagnosis not present

## 2016-10-09 DIAGNOSIS — R350 Frequency of micturition: Secondary | ICD-10-CM

## 2016-10-09 DIAGNOSIS — H269 Unspecified cataract: Secondary | ICD-10-CM | POA: Diagnosis not present

## 2016-10-09 DIAGNOSIS — N401 Enlarged prostate with lower urinary tract symptoms: Secondary | ICD-10-CM

## 2016-10-09 DIAGNOSIS — G3184 Mild cognitive impairment, so stated: Secondary | ICD-10-CM | POA: Diagnosis not present

## 2016-10-09 DIAGNOSIS — Z794 Long term (current) use of insulin: Secondary | ICD-10-CM | POA: Diagnosis not present

## 2016-10-09 LAB — CBC WITH DIFFERENTIAL/PLATELET
Basophils Absolute: 73 cells/uL (ref 0–200)
Basophils Relative: 1 %
Eosinophils Absolute: 146 cells/uL (ref 15–500)
Eosinophils Relative: 2 %
HCT: 48.7 % (ref 38.5–50.0)
Hemoglobin: 16.5 g/dL (ref 13.2–17.1)
Lymphocytes Relative: 20 %
Lymphs Abs: 1460 cells/uL (ref 850–3900)
MCH: 30.7 pg (ref 27.0–33.0)
MCHC: 33.9 g/dL (ref 32.0–36.0)
MCV: 90.5 fL (ref 80.0–100.0)
MPV: 10.4 fL (ref 7.5–12.5)
Monocytes Absolute: 511 cells/uL (ref 200–950)
Monocytes Relative: 7 %
Neutro Abs: 5110 cells/uL (ref 1500–7800)
Neutrophils Relative %: 70 %
Platelets: 218 10*3/uL (ref 140–400)
RBC: 5.38 MIL/uL (ref 4.20–5.80)
RDW: 13.7 % (ref 11.0–15.0)
WBC: 7.3 10*3/uL (ref 3.8–10.8)

## 2016-10-09 LAB — HEMOGLOBIN A1C
Hgb A1c MFr Bld: 6.4 % — ABNORMAL HIGH (ref <5.7)
Mean Plasma Glucose: 137 mg/dL
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
## 2016-10-10 LAB — COMPLETE METABOLIC PANEL WITH GFR
ALT: 67 U/L — ABNORMAL HIGH (ref 9–46)
AST: 49 U/L — ABNORMAL HIGH (ref 10–35)
Albumin: 4.6 g/dL (ref 3.6–5.1)
Alkaline Phosphatase: 40 U/L (ref 40–115)
BUN: 11 mg/dL (ref 7–25)
CO2: 32 mmol/L — ABNORMAL HIGH (ref 20–31)
Calcium: 9.9 mg/dL (ref 8.6–10.3)
Chloride: 101 mmol/L (ref 98–110)
Creat: 0.9 mg/dL (ref 0.70–1.25)
GFR, Est African American: >89 mL/min (ref >=60)
GFR, Est Non African American: 88 mL/min (ref >=60)
Glucose, Bld: 169 mg/dL — ABNORMAL HIGH (ref 65–99)
Potassium: 5.1 mmol/L (ref 3.5–5.3)
Sodium: 142 mmol/L (ref 135–146)
Total Bilirubin: 0.5 mg/dL (ref 0.2–1.2)
Total Protein: 7.2 g/dL (ref 6.1–8.1)

## 2016-10-10 LAB — MICROALBUMIN / CREATININE URINE RATIO
Creatinine, Urine: 30 mg/dL (ref 20–370)
Microalb Creat Ratio: 37 mcg/mg creat — ABNORMAL HIGH (ref <30)
Microalb, Ur: 1.1 mg/dL

## 2016-10-10 LAB — LIPID PANEL
Cholesterol: 179 mg/dL (ref <200)
HDL: 55 mg/dL (ref >40)
LDL Cholesterol: 101 mg/dL — ABNORMAL HIGH (ref <100)
Total CHOL/HDL Ratio: 3.3 Ratio (ref <5.0)
Triglycerides: 116 mg/dL (ref <150)
VLDL: 23 mg/dL (ref <30)

## 2016-10-10 LAB — PSA: PSA: 3 ng/mL (ref <=4.0)

## 2016-10-12 ENCOUNTER — Encounter: Payer: Self-pay | Admitting: *Deleted

## 2016-10-19 DIAGNOSIS — H2512 Age-related nuclear cataract, left eye: Secondary | ICD-10-CM | POA: Diagnosis not present

## 2016-10-20 DIAGNOSIS — H2511 Age-related nuclear cataract, right eye: Secondary | ICD-10-CM | POA: Diagnosis not present

## 2016-10-25 ENCOUNTER — Other Ambulatory Visit: Payer: Self-pay | Admitting: Internal Medicine

## 2016-11-09 DIAGNOSIS — H2511 Age-related nuclear cataract, right eye: Secondary | ICD-10-CM | POA: Diagnosis not present

## 2016-11-16 ENCOUNTER — Other Ambulatory Visit: Payer: Self-pay | Admitting: Internal Medicine

## 2016-11-18 ENCOUNTER — Other Ambulatory Visit: Payer: Self-pay | Admitting: *Deleted

## 2016-11-18 MED ORDER — SUVOREXANT 20 MG PO TABS: 1.0000 | ORAL_TABLET | Freq: Every evening | ORAL | 3 refills | Status: DC | PRN

## 2016-11-18 NOTE — Telephone Encounter (Signed)
Patient requested refill

## 2016-11-19 ENCOUNTER — Ambulatory Visit (INDEPENDENT_AMBULATORY_CARE_PROVIDER_SITE_OTHER): Payer: Medicare HMO

## 2016-11-19 ENCOUNTER — Ambulatory Visit (INDEPENDENT_AMBULATORY_CARE_PROVIDER_SITE_OTHER): Payer: Medicare HMO | Admitting: Internal Medicine

## 2016-11-19 ENCOUNTER — Encounter: Payer: Self-pay | Admitting: Internal Medicine

## 2016-11-19 VITALS — BP 130/80 | HR 81 | Temp 97.9°F | Ht 68.0 in | Wt 189.0 lb

## 2016-11-19 VITALS — BP 150/75 | HR 81 | Temp 97.9°F | Ht 68.0 in | Wt 189.0 lb

## 2016-11-19 DIAGNOSIS — E119 Type 2 diabetes mellitus without complications: Secondary | ICD-10-CM

## 2016-11-19 DIAGNOSIS — F419 Anxiety disorder, unspecified: Secondary | ICD-10-CM | POA: Diagnosis not present

## 2016-11-19 DIAGNOSIS — R413 Other amnesia: Secondary | ICD-10-CM

## 2016-11-19 DIAGNOSIS — Z0000 Encounter for general adult medical examination without abnormal findings: Secondary | ICD-10-CM | POA: Diagnosis not present

## 2016-11-19 DIAGNOSIS — Z79899 Other long term (current) drug therapy: Secondary | ICD-10-CM

## 2016-11-19 DIAGNOSIS — Z794 Long term (current) use of insulin: Secondary | ICD-10-CM

## 2016-11-19 MED ORDER — ZOLPIDEM TARTRATE 5 MG PO TABS: 5.0000 mg | ORAL_TABLET | Freq: Every evening | ORAL | 1 refills | Status: DC | PRN

## 2016-11-19 MED ORDER — LORAZEPAM 0.5 MG PO TABS: 0.5000 mg | ORAL_TABLET | Freq: Every evening | ORAL | 0 refills | Status: DC | PRN

## 2016-11-19 NOTE — Progress Notes (Addendum)
   I reviewed health advisor's note, was available for consultation and agree with the assessment and plan as written.  not clear that hep c will be covered by insurance so does not want screen.  Need to do foot exam next time.   Tiffany L. [name], D.O. [facility] [address], [place] [address] Cell Phone (Mon-Fri 8am-5pm):  [phone] On Call:  [phone] & follow prompts after 5pm & weekends Office Phone:  [phone] Office Fax:  [phone]   Quick Notes   Health Maintenance: pt due for foot exam and hep c screen. Due for colonoscopy but declined [place] to schedule something until it gets warmer outside. Pt need for TDAP and shingles. I educated him and told him how to get them if he decided he wanted to.   Abnormal Screen: MMSE 30/30.  Passed clock drawing.  1st BP: 170/80 2nd BP @ end of visit: 150/75     Patient Concerns: Out of Suvorexant for 3 days and has been trying to get it refilled by [place].     Nurse Concerns: BP
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
## 2016-11-19 NOTE — Patient Instructions (Signed)
Stop metformin. Continue current insulin Call me if your sugars are running over 200 or significantly go up  Otherwise we will recheck your sugar average before your next visit in 3 months  As we discussed at your visit, I'm worried that you are having more memory problems due to your anxiety medication and sleeping pills.  Let's stop the belsomra and reduce the ativan.  You can go back on the [place] that you used to take (it can also affect memory, but I know you need something to help).

## 2016-11-19 NOTE — Patient Instructions (Signed)
[name] , Thank you for taking time to come for your Medicare Wellness Visit. I appreciate your ongoing commitment to your health goals. Please review the following plan we discussed and let me know if I can assist you in the future.   Screening recommendations/referrals: Colonoscopy: Discussed with you, call us when you are ready for it to be scheduled Recommended yearly ophthalmology/optometry visit for glaucoma screening and checkup Recommended yearly dental visit for hygiene and checkup  Vaccinations: Influenza vaccine up to date Pneumococcal vaccine up to date Tdap vaccine due now Shingles vaccine due now  Advanced directives: Advance directive discussed with you today.. Once this is complete please bring a copy in to our office so we can scan it into your chart.  Conditions/risks identified: None  Next appointment: [name] 11/19/16 @ 3:30. Schedule your next annual wellness visit for next year. Thanks!  Preventive Care 30 Years and Older, Male Preventive care refers to lifestyle choices and visits with your health care provider that can promote health and wellness. What does preventive care include?  A yearly physical exam. This is also called an annual well check.  Dental exams once or twice a year.  Routine eye exams. Ask your health care provider how often you should have your eyes checked.  Personal lifestyle choices, including:  Daily care of your teeth and gums.  Regular physical activity.  Eating a healthy diet.  Avoiding tobacco and drug use.  Limiting alcohol use.  Practicing safe sex.  Taking low doses of aspirin every day.  Taking vitamin and mineral supplements as recommended by your health care provider. What happens during an annual well check? The services and screenings done by your health care provider during your annual well check will depend on your age, overall health, lifestyle risk factors, and family history of disease. Counseling  Your  health care provider may ask you questions about your:  Alcohol use.  Tobacco use.  Drug use.  Emotional well-being.  Home and relationship well-being.  Sexual activity.  Eating habits.  History of falls.  Memory and ability to understand (cognition).  Work and work Statistician. Screening  You may have the following tests or measurements:  Height, weight, and BMI.  Blood pressure.  Lipid and cholesterol levels. These may be checked every 5 years, or more frequently if you are over 36 years old.  Skin check.  Lung cancer screening. You may have this screening every year starting at age 2 if you have a 30-pack-year history of smoking and currently smoke or have quit within the past 15 years.  Fecal occult blood test (FOBT) of the stool. You may have this test every year starting at age 34.  Flexible sigmoidoscopy or colonoscopy. You may have a sigmoidoscopy every 5 years or a colonoscopy every 10 years starting at age 98.  Prostate cancer screening. Recommendations will vary depending on your family history and other risks.  Hepatitis C blood test.  Hepatitis B blood test.  Sexually transmitted disease (STD) testing.  Diabetes screening. This is done by checking your blood sugar (glucose) after you have not eaten for a while (fasting). You may have this done every 1-3 years.  Abdominal aortic aneurysm (AAA) screening. You may need this if you are a current or former smoker.  Osteoporosis. You may be screened starting at age 12 if you are at high risk. Talk with your health care provider about your test results, treatment options, and if necessary, the need for more tests. Vaccines  Your health care provider may recommend certain vaccines, such as:  Influenza vaccine. This is recommended every year.  Tetanus, diphtheria, and acellular pertussis (Tdap, Td) vaccine. You may need a Td booster every 10 years.  Zoster vaccine. You may need this after age  74.  Pneumococcal 13-valent conjugate (PCV13) vaccine. One dose is recommended after age 19.  Pneumococcal polysaccharide (PPSV23) vaccine. One dose is recommended after age 2. Talk to your health care provider about which screenings and vaccines you need and how often you need them. This information is not intended to replace advice given to you by your health care provider. Make sure you discuss any questions you have with your health care provider. Document Released: 09/20/2015 Document Revised: 05/13/2016 Document Reviewed: 06/25/2015 Elsevier Interactive Patient Education  2017 [employer] Prevention in the Home Falls can cause injuries. They can happen to people of all ages. There are many things you can do to make your home safe and to help prevent falls. What can I do on the outside of my home?  Regularly fix the edges of walkways and driveways and fix any cracks.  Remove anything that might make you trip as you walk through a door, such as a raised step or threshold.  Trim any bushes or trees on the path to your home.  Use bright outdoor lighting.  Clear any walking paths of anything that might make someone trip, such as rocks or tools.  Regularly check to see if handrails are loose or broken. Make sure that both sides of any steps have handrails.  Any raised decks and porches should have guardrails on the edges.  Have any leaves, snow, or ice cleared regularly.  Use sand or salt on walking paths during winter.  Clean up any spills in your garage right away. This includes oil or grease spills. What can I do in the bathroom?  Use night lights.  Install grab bars by the toilet and in the tub and shower. Do not use towel bars as grab bars.  Use non-skid mats or decals in the tub or shower.  If you need to sit down in the shower, use a plastic, non-slip stool.  Keep the floor dry. Clean up any water that spills on the floor as soon as it happens.  Remove  soap buildup in the tub or shower regularly.  Attach bath mats securely with double-sided non-slip rug tape.  Do not have throw rugs and other things on the floor that can make you trip. What can I do in the bedroom?  Use night lights.  Make sure that you have a light by your bed that is easy to reach.  Do not use any sheets or blankets that are too big for your bed. They should not hang down onto the floor.  Have a firm chair that has side arms. You can use this for support while you get dressed.  Do not have throw rugs and other things on the floor that can make you trip. What can I do in the kitchen?  Clean up any spills right away.  Avoid walking on wet floors.  Keep items that you use a lot in easy-to-reach places.  If you need to reach something above you, use a strong step stool that has a grab bar.  Keep electrical cords out of the way.  Do not use floor polish or wax that makes floors slippery. If you must use wax, use non-skid floor wax.  Do  not have throw rugs and other things on the floor that can make you trip. What can I do with my stairs?  Do not leave any items on the stairs.  Make sure that there are handrails on both sides of the stairs and use them. Fix handrails that are broken or loose. Make sure that handrails are as long as the stairways.  Check any carpeting to make sure that it is firmly attached to the stairs. Fix any carpet that is loose or worn.  Avoid having throw rugs at the top or bottom of the stairs. If you do have throw rugs, attach them to the floor with carpet tape.  Make sure that you have a light switch at the top of the stairs and the bottom of the stairs. If you do not have them, ask someone to add them for you. What else can I do to help prevent falls?  Wear shoes that:  Do not have high heels.  Have rubber bottoms.  Are comfortable and fit you well.  Are closed at the toe. Do not wear sandals.  If you use a  stepladder:  Make sure that it is fully opened. Do not climb a closed stepladder.  Make sure that both sides of the stepladder are locked into place.  Ask someone to hold it for you, if possible.  Clearly mark and make sure that you can see:  Any grab bars or handrails.  First and last steps.  Where the edge of each step is.  Use tools that help you move around (mobility aids) if they are needed. These include:  Canes.  Walkers.  Scooters.  Crutches.  Turn on the lights when you go into a dark area. Replace any light bulbs as soon as they burn out.  Set up your furniture so you have a clear path. Avoid moving your furniture around.  If any of your floors are uneven, fix them.  If there are any pets around you, be aware of where they are.  Review your medicines with your doctor. Some medicines can make you feel dizzy. This can increase your chance of falling. Ask your doctor what other things that you can do to help prevent falls. This information is not intended to replace advice given to you by your health care provider. Make sure you discuss any questions you have with your health care provider. Document Released: 06/20/2009 Document Revised: 01/30/2016 Document Reviewed: 09/28/2014 Elsevier Interactive Patient Education  2017 [employer].

## 2016-12-24 ENCOUNTER — Other Ambulatory Visit: Payer: Self-pay | Admitting: Internal Medicine

## 2016-12-25 ENCOUNTER — Other Ambulatory Visit: Payer: Self-pay

## 2016-12-25 MED ORDER — GLUCOSE BLOOD VI STRP
ORAL_STRIP | 4 refills | Status: DC
Start: 2016-12-25 — End: 2017-06-28

## 2016-12-25 NOTE — Telephone Encounter (Signed)
Fax received form [facility], RX was sent today for diabetic test strips. Please add diagnosis code to rx and resubmit  Request completed

## 2017-01-14 ENCOUNTER — Other Ambulatory Visit: Payer: Self-pay | Admitting: Internal Medicine

## 2017-01-15 NOTE — Telephone Encounter (Signed)
rx called into pharmacy

## 2017-01-23 ENCOUNTER — Other Ambulatory Visit: Payer: Self-pay | Admitting: Internal Medicine

## 2017-02-15 ENCOUNTER — Other Ambulatory Visit: Payer: Self-pay | Admitting: Nurse Practitioner

## 2017-02-15 ENCOUNTER — Other Ambulatory Visit: Payer: Self-pay | Admitting: Internal Medicine

## 2017-02-15 DIAGNOSIS — M25512 Pain in left shoulder: Secondary | ICD-10-CM

## 2017-02-15 DIAGNOSIS — R0789 Other chest pain: Secondary | ICD-10-CM

## 2017-02-16 ENCOUNTER — Other Ambulatory Visit: Payer: Medicare HMO

## 2017-02-16 DIAGNOSIS — Z794 Long term (current) use of insulin: Principal | ICD-10-CM

## 2017-02-16 DIAGNOSIS — E119 Type 2 diabetes mellitus without complications: Secondary | ICD-10-CM | POA: Diagnosis not present

## 2017-02-16 LAB — BASIC METABOLIC PANEL
BUN: 15 mg/dL (ref 7–25)
CO2: 28 mmol/L (ref 20–31)
Calcium: 9.9 mg/dL (ref 8.6–10.3)
Chloride: 96 mmol/L — ABNORMAL LOW (ref 98–110)
Creat: 1.02 mg/dL (ref 0.70–1.25)
Glucose, Bld: 159 mg/dL — ABNORMAL HIGH (ref 65–99)
Potassium: 4.8 mmol/L (ref 3.5–5.3)
Sodium: 136 mmol/L (ref 135–146)

## 2017-02-17 LAB — HEMOGLOBIN A1C
Hgb A1c MFr Bld: 7.3 % — ABNORMAL HIGH (ref <5.7)
Mean Plasma Glucose: 163 mg/dL

## 2017-02-19 ENCOUNTER — Ambulatory Visit (INDEPENDENT_AMBULATORY_CARE_PROVIDER_SITE_OTHER): Payer: Medicare HMO | Admitting: Internal Medicine

## 2017-02-19 ENCOUNTER — Encounter: Payer: Self-pay | Admitting: Internal Medicine

## 2017-02-19 VITALS — BP 138/70 | HR 83 | Temp 98.3°F | Wt 195.0 lb

## 2017-02-19 DIAGNOSIS — J449 Chronic obstructive pulmonary disease, unspecified: Secondary | ICD-10-CM | POA: Diagnosis not present

## 2017-02-19 DIAGNOSIS — E119 Type 2 diabetes mellitus without complications: Secondary | ICD-10-CM | POA: Diagnosis not present

## 2017-02-19 DIAGNOSIS — Z86010 Personal history of colonic polyps: Secondary | ICD-10-CM

## 2017-02-19 DIAGNOSIS — Z794 Long term (current) use of insulin: Secondary | ICD-10-CM | POA: Diagnosis not present

## 2017-02-19 DIAGNOSIS — F5104 Psychophysiologic insomnia: Secondary | ICD-10-CM

## 2017-02-19 DIAGNOSIS — R350 Frequency of micturition: Secondary | ICD-10-CM

## 2017-02-19 DIAGNOSIS — N401 Enlarged prostate with lower urinary tract symptoms: Secondary | ICD-10-CM

## 2017-02-19 DIAGNOSIS — G3184 Mild cognitive impairment, so stated: Secondary | ICD-10-CM

## 2017-02-19 MED ORDER — LORAZEPAM 1 MG PO TABS: 1.0000 mg | ORAL_TABLET | Freq: Every evening | ORAL | 5 refills | Status: DC | PRN
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
## 2017-02-22 ENCOUNTER — Encounter: Payer: Self-pay | Admitting: Gastroenterology

## 2017-03-21 ENCOUNTER — Other Ambulatory Visit: Payer: Self-pay | Admitting: Internal Medicine

## 2017-04-22 ENCOUNTER — Other Ambulatory Visit: Payer: Self-pay | Admitting: Nurse Practitioner

## 2017-04-23 ENCOUNTER — Ambulatory Visit (AMBULATORY_SURGERY_CENTER): Payer: Self-pay

## 2017-04-23 VITALS — Ht 66.5 in | Wt 190.2 lb

## 2017-04-23 DIAGNOSIS — Z860100 Personal history of colon polyps, unspecified: Secondary | ICD-10-CM

## 2017-04-23 MED ORDER — SUPREP BOWEL PREP KIT 17.5-3.13-1.6 GM/177ML PO SOLN: 1.0000 | Freq: Once | ORAL | 0 refills | Status: AC

## 2017-04-23 NOTE — Progress Notes (Signed)
No allregies to eggs or soy No diet meds No home oxygen No past problems with anesthesia  Declined emmi

## 2017-04-26 ENCOUNTER — Encounter: Payer: Self-pay | Admitting: Gastroenterology

## 2017-05-07 ENCOUNTER — Ambulatory Visit (AMBULATORY_SURGERY_CENTER): Payer: Medicare HMO | Admitting: Gastroenterology

## 2017-05-07 ENCOUNTER — Encounter: Payer: Self-pay | Admitting: Gastroenterology

## 2017-05-07 VITALS — BP 113/62 | HR 74 | Temp 98.7°F | Resp 17 | Ht 66.5 in | Wt 190.0 lb

## 2017-05-07 DIAGNOSIS — D128 Benign neoplasm of rectum: Secondary | ICD-10-CM | POA: Diagnosis not present

## 2017-05-07 DIAGNOSIS — Z86010 Personal history of colonic polyps: Secondary | ICD-10-CM

## 2017-05-07 DIAGNOSIS — D123 Benign neoplasm of transverse colon: Secondary | ICD-10-CM | POA: Diagnosis not present

## 2017-05-07 DIAGNOSIS — D122 Benign neoplasm of ascending colon: Secondary | ICD-10-CM

## 2017-05-07 DIAGNOSIS — D125 Benign neoplasm of sigmoid colon: Secondary | ICD-10-CM

## 2017-05-07 DIAGNOSIS — D124 Benign neoplasm of descending colon: Secondary | ICD-10-CM

## 2017-05-07 MED ORDER — SODIUM CHLORIDE 0.9 % IV SOLN: 500.0000 mL | INTRAVENOUS | Status: DC

## 2017-05-07 NOTE — Patient Instructions (Signed)
YOU HAD AN ENDOSCOPIC PROCEDURE TODAY AT THE [facility]:   Refer to the procedure report that was given to you for any specific questions about what was found during the examination.  If the procedure report does not answer your questions, please call your gastroenterologist to clarify.  If you requested that your care partner not be given the details of your procedure findings, then the procedure report has been included in a sealed envelope for you to review at your convenience later.  YOU SHOULD EXPECT: Some feelings of bloating in the abdomen. Passage of more gas than usual.  Walking can help get rid of the air that was put into your GI tract during the procedure and reduce the bloating. If you had a lower endoscopy (such as a colonoscopy or flexible sigmoidoscopy) you may notice spotting of blood in your stool or on the toilet paper. If you underwent a bowel prep for your procedure, you may not have a normal bowel movement for a few days.  Please Note:  You might notice some irritation and congestion in your nose or some drainage.  This is from the oxygen used during your procedure.  There is no need for concern and it should clear up in a day or so.  SYMPTOMS TO REPORT IMMEDIATELY:   Following lower endoscopy (colonoscopy or flexible sigmoidoscopy):  Excessive amounts of blood in the stool  Significant tenderness or worsening of abdominal pains  Swelling of the abdomen that is new, acute  Fever of 100F or higher    For urgent or emergent issues, a gastroenterologist can be reached at any hour by calling [phone].   DIET:  We do recommend a small meal at first, but then you may proceed to your regular diet.  Drink plenty of fluids but you should avoid alcoholic beverages for 24 hours.  ACTIVITY:  You should plan to take it easy for the rest of today and you should NOT DRIVE or use heavy machinery until tomorrow (because of the sedation medicines used during the test).     FOLLOW UP: Our staff will call the number listed on your records the next business day following your procedure to check on you and address any questions or concerns that you may have regarding the information given to you following your procedure. If we do not reach you, we will leave a message.  However, if you are feeling well and you are not experiencing any problems, there is no need to return our call.  We will assume that you have returned to your regular daily activities without incident.  If any biopsies were taken you will be contacted by phone or by letter within the next 1-3 weeks.  Please call us at [phone] if you have not heard about the biopsies in 3 weeks.    SIGNATURES/CONFIDENTIALITY: You and/or your care partner have signed paperwork which will be entered into your electronic medical record.  These signatures attest to the fact that that the information above on your After Visit Summary has been reviewed and is understood.  Full responsibility of the confidentiality of this discharge information lies with you and/or your care-partner.   Resume medications. Information given on polyps. 

## 2017-05-07 NOTE — Progress Notes (Signed)
A/ox3 pleased with MAC, report to Sheila RN 

## 2017-05-07 NOTE — Progress Notes (Signed)
Called to room to assist during endoscopic procedure.  Patient ID and intended procedure confirmed with present staff. Received instructions for my participation in the procedure from the performing physician.  

## 2017-05-07 NOTE — Op Note (Signed)
[place] Patient Name: Matthew Dominguez Procedure Date: 05/07/2017 8:15 AM MRN: [id number] Endoscopist: Milus Banister , MD Age: 66 Referring MD:  Date of Birth: 1950/12/09 Gender: Male Account #: [id number] Procedure:                Colonoscopy Indications:              High risk colon cancer surveillance: Personal                            history of colonic polyps; 10 polyps removed 2013,                            most adenomas, was recommende to have repeat                            examination in 3 years. Medicines:                Monitored Anesthesia Care Procedure:                Pre-Anesthesia Assessment:                           - Prior to the procedure, a History and Physical                            was performed, and patient medications and                            allergies were reviewed. The patient's tolerance of                            previous anesthesia was also reviewed. The risks                            and benefits of the procedure and the sedation                            options and risks were discussed with the patient.                            All questions were answered, and informed consent                            was obtained. Prior Anticoagulants: The patient has                            taken no previous anticoagulant or antiplatelet                            agents. ASA Grade Assessment: II - A patient with                            mild systemic disease. After reviewing the risks  and benefits, the patient was deemed in                            satisfactory condition to undergo the procedure.                           After obtaining informed consent, the colonoscope                            was passed under direct vision. Throughout the                            procedure, the patient's blood pressure, pulse, and                            oxygen saturations were monitored continuously.  The                            Colonoscope was introduced through the anus and                            advanced to the the cecum, identified by                            appendiceal orifice and ileocecal valve. The                            colonoscopy was performed without difficulty. The                            patient tolerated the procedure well. The quality                            of the bowel preparation was excellent. The                            ileocecal valve, appendiceal orifice, and rectum                            were photographed. Scope In: 8:18:18 AM Scope Out: 2:83:15 AM Scope Withdrawal Time: 0 hours 23 minutes 2 seconds  Total Procedure Duration: 0 hours 27 minutes 44 seconds  Findings:                 Eight sessile polyps were found in the rectum,                            sigmoid colon, descending colon and transverse                            colon. The polyps were 3 to 10 mm in size. These                            polyps were removed with a cold snare. Resection  and retrieval were complete.                           A 15 mm polyp was found in the transverse colon.                            The polyp was sessile. The polyp was removed with a                            hot snare. Resection and retrieval were complete.                            Area was tattooed with an injection of Spot (carbon                            black).                           Four sessile polyps were found in the transverse                            colon and ascending colon. The polyps were 5 to 20                            mm in size. The largest was 38mm across in                            ascending segment, removed with cold snare in                            piecemeal fashion. These polyps were removed with a                            cold snare. Resection and retrieval were complete.                           The exam was otherwise  without abnormality on                            direct and retroflexion views. Complications:            No immediate complications. Estimated blood loss:                            None. Estimated Blood Loss:     Estimated blood loss: none. Impression:               - Eight 3 to 10 mm polyps in the rectum, in the                            sigmoid colon, in the descending colon and in the                            transverse colon, removed  with a cold snare.                            Resected and retrieved (jar 3)                           - One 15 mm polyp in the transverse colon, removed                            with a hot snare. This was slightly firm. Resected                            and retrieved. Tattooed in submucosal injection of                            SPOT to aid in future localization (jar 2)                           - Four 5 to 20 mm polyps in the transverse colon                            and in the ascending colon, removed with a cold                            snare. The largest was in the ascending segment and                            it required piecemeal cold snare resection. All                            resected Resected and retrieved.                           - The examination was otherwise normal on direct                            and retroflexion views.                           - Thirteen polyps total. Recommendation:           - Patient has a contact number available for                            emergencies. The signs and symptoms of potential                            delayed complications were discussed with the                            patient. Return to normal activities tomorrow.                            Written discharge instructions were provided to the  patient.                           - Resume previous diet.                           - Continue present medications.                           - Repeat  colonoscopy is recommended. The                            colonoscopy date will be determined after pathology                            results from today's exam become available for                            review (likely 3-6 months). Milus Banister, MD 05/07/2017 8:54:21 AM This report has been signed electronically.

## 2017-05-07 NOTE — Progress Notes (Signed)
Pt's states no medical or surgical changes since previsit or office visit. 

## 2017-05-11 ENCOUNTER — Telehealth: Payer: Self-pay | Admitting: *Deleted

## 2017-05-11 NOTE — Telephone Encounter (Signed)
  Follow up Call-  Call back number 05/07/2017  Post procedure Call Back phone  # [phone]  Permission to leave phone message Yes  Some recent data might be hidden     Patient questions:  Do you have a fever, pain , or abdominal swelling? No. Pain Score  0 *  Have you tolerated food without any problems? Yes.    Have you been able to return to your normal activities? Yes.    Do you have any questions about your discharge instructions: Diet   No. Medications  No. Follow up visit  No.  Do you have questions or concerns about your Care? No.  Actions: * If pain score is 4 or above: No action needed, pain <4.

## 2017-05-11 NOTE — Telephone Encounter (Signed)
Left message on f/u call 

## 2017-05-16 ENCOUNTER — Encounter: Payer: Self-pay | Admitting: Gastroenterology

## 2017-05-17 ENCOUNTER — Other Ambulatory Visit: Payer: Self-pay | Admitting: Internal Medicine

## 2017-05-17 ENCOUNTER — Other Ambulatory Visit: Payer: Self-pay

## 2017-05-17 DIAGNOSIS — K635 Polyp of colon: Secondary | ICD-10-CM

## 2017-05-21 ENCOUNTER — Other Ambulatory Visit: Payer: Self-pay | Admitting: Internal Medicine

## 2017-06-25 ENCOUNTER — Other Ambulatory Visit: Payer: Medicare HMO

## 2017-06-25 DIAGNOSIS — Z794 Long term (current) use of insulin: Principal | ICD-10-CM

## 2017-06-25 DIAGNOSIS — E119 Type 2 diabetes mellitus without complications: Secondary | ICD-10-CM | POA: Diagnosis not present

## 2017-06-25 DIAGNOSIS — R350 Frequency of micturition: Secondary | ICD-10-CM | POA: Diagnosis not present

## 2017-06-25 DIAGNOSIS — N401 Enlarged prostate with lower urinary tract symptoms: Secondary | ICD-10-CM | POA: Diagnosis not present

## 2017-06-26 ENCOUNTER — Other Ambulatory Visit: Payer: Self-pay | Admitting: Internal Medicine

## 2017-06-26 LAB — LIPID PANEL
Cholesterol: 198 mg/dL (ref <200)
HDL: 46 mg/dL (ref >40)
LDL Cholesterol (Calc): 121 mg/dL (calc) — ABNORMAL HIGH
Non-HDL Cholesterol (Calc): 152 mg/dL (calc) — ABNORMAL HIGH (ref <130)
Total CHOL/HDL Ratio: 4.3 (calc) (ref <5.0)
Triglycerides: 194 mg/dL — ABNORMAL HIGH (ref <150)

## 2017-06-26 LAB — BASIC METABOLIC PANEL
BUN: 11 mg/dL (ref 7–25)
CO2: 30 mmol/L (ref 20–32)
Calcium: 9.5 mg/dL (ref 8.6–10.3)
Chloride: 100 mmol/L (ref 98–110)
Creat: 0.73 mg/dL (ref 0.70–1.25)
Glucose, Bld: 167 mg/dL — ABNORMAL HIGH (ref 65–99)
Potassium: 4 mmol/L (ref 3.5–5.3)
Sodium: 140 mmol/L (ref 135–146)

## 2017-06-26 LAB — HEMOGLOBIN A1C
Hgb A1c MFr Bld: 7.5 % of total Hgb — ABNORMAL HIGH (ref <5.7)
Mean Plasma Glucose: 169 (calc)
eAG (mmol/L): 9.3 (calc)

## 2017-06-28 ENCOUNTER — Encounter: Payer: Self-pay | Admitting: Internal Medicine

## 2017-06-28 ENCOUNTER — Ambulatory Visit (INDEPENDENT_AMBULATORY_CARE_PROVIDER_SITE_OTHER): Payer: Medicare HMO | Admitting: Internal Medicine

## 2017-06-28 VITALS — BP 140/70 | HR 82 | Temp 97.8°F | Wt 188.0 lb

## 2017-06-28 DIAGNOSIS — Z794 Long term (current) use of insulin: Secondary | ICD-10-CM | POA: Diagnosis not present

## 2017-06-28 DIAGNOSIS — E782 Mixed hyperlipidemia: Secondary | ICD-10-CM

## 2017-06-28 DIAGNOSIS — Z23 Encounter for immunization: Secondary | ICD-10-CM

## 2017-06-28 DIAGNOSIS — E119 Type 2 diabetes mellitus without complications: Secondary | ICD-10-CM

## 2017-06-28 DIAGNOSIS — I10 Essential (primary) hypertension: Secondary | ICD-10-CM

## 2017-06-28 DIAGNOSIS — J449 Chronic obstructive pulmonary disease, unspecified: Secondary | ICD-10-CM | POA: Diagnosis not present

## 2017-06-28 DIAGNOSIS — R350 Frequency of micturition: Secondary | ICD-10-CM | POA: Diagnosis not present

## 2017-06-28 DIAGNOSIS — G4700 Insomnia, unspecified: Secondary | ICD-10-CM | POA: Diagnosis not present

## 2017-06-28 DIAGNOSIS — N401 Enlarged prostate with lower urinary tract symptoms: Secondary | ICD-10-CM

## 2017-06-28 MED ORDER — ONETOUCH ULTRA 2 W/DEVICE KIT: PACK | 0 refills | Status: DC

## 2017-06-28 MED ORDER — GLUCOSE BLOOD VI STRP: ORAL_STRIP | 0 refills | Status: DC

## 2017-06-28 MED ORDER — FINASTERIDE 5 MG PO TABS: 5.0000 mg | ORAL_TABLET | Freq: Every day | ORAL | 3 refills | Status: DC

## 2017-06-28 MED ORDER — ONETOUCH ULTRASOFT LANCETS MISC: 0 refills | Status: DC

## 2017-06-28 MED ORDER — ZOSTER VAC RECOMB ADJUVANTED 50 MCG/0.5ML IM SUSR: 0.5000 mL | Freq: Once | INTRAMUSCULAR | 1 refills | Status: AC

## 2017-06-28 NOTE — Telephone Encounter (Signed)
rx called into pharmacy
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
## 2017-07-22 ENCOUNTER — Other Ambulatory Visit: Payer: Self-pay | Admitting: Internal Medicine

## 2017-07-28 ENCOUNTER — Other Ambulatory Visit: Payer: Self-pay | Admitting: Internal Medicine

## 2017-07-28 NOTE — Telephone Encounter (Signed)
A refill request was received from [facility] for zolpidem 5 mg tablets. Rx was phone in to pharmacy after being checked on the PMP [place].

## 2017-08-18 ENCOUNTER — Other Ambulatory Visit: Payer: Medicare HMO

## 2017-08-18 ENCOUNTER — Encounter: Payer: Self-pay | Admitting: Genetic Counselor

## 2017-08-18 ENCOUNTER — Ambulatory Visit (HOSPITAL_BASED_OUTPATIENT_CLINIC_OR_DEPARTMENT_OTHER): Payer: Medicare HMO | Admitting: Genetic Counselor

## 2017-08-18 DIAGNOSIS — Z803 Family history of malignant neoplasm of breast: Secondary | ICD-10-CM | POA: Diagnosis not present

## 2017-08-18 DIAGNOSIS — D126 Benign neoplasm of colon, unspecified: Secondary | ICD-10-CM

## 2017-08-18 DIAGNOSIS — Z8371 Family history of colonic polyps: Secondary | ICD-10-CM | POA: Insufficient documentation

## 2017-08-18 DIAGNOSIS — K635 Polyp of colon: Secondary | ICD-10-CM | POA: Insufficient documentation
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
## 2017-08-19 ENCOUNTER — Other Ambulatory Visit: Payer: Self-pay | Admitting: Internal Medicine

## 2017-08-19 DIAGNOSIS — F5104 Psychophysiologic insomnia: Secondary | ICD-10-CM

## 2017-08-20 ENCOUNTER — Other Ambulatory Visit: Payer: Self-pay | Admitting: Internal Medicine

## 2017-08-20 DIAGNOSIS — F5104 Psychophysiologic insomnia: Secondary | ICD-10-CM

## 2017-08-23 ENCOUNTER — Other Ambulatory Visit: Payer: Self-pay | Admitting: Internal Medicine

## 2017-08-25 ENCOUNTER — Other Ambulatory Visit: Payer: Self-pay | Admitting: *Deleted

## 2017-08-25 DIAGNOSIS — F5104 Psychophysiologic insomnia: Secondary | ICD-10-CM

## 2017-08-25 MED ORDER — ZOLPIDEM TARTRATE 5 MG PO TABS: ORAL_TABLET | ORAL | 0 refills | Status: DC

## 2017-08-25 MED ORDER — METFORMIN HCL 1000 MG PO TABS: 1000.0000 mg | ORAL_TABLET | Freq: Two times a day (BID) | ORAL | 3 refills | Status: DC

## 2017-08-25 MED ORDER — LORAZEPAM 1 MG PO TABS: ORAL_TABLET | ORAL | 0 refills | Status: DC

## 2017-08-25 NOTE — Telephone Encounter (Signed)
Patient called and requested. Reviewed Dr. Cyndi Lennert last OV note and it stated for patient to continue Metformin and insulin for DM.

## 2017-09-20 ENCOUNTER — Other Ambulatory Visit: Payer: Self-pay | Admitting: Internal Medicine

## 2017-09-20 ENCOUNTER — Other Ambulatory Visit: Payer: Self-pay | Admitting: *Deleted

## 2017-09-20 DIAGNOSIS — IMO0001 Reserved for inherently not codable concepts without codable children: Secondary | ICD-10-CM

## 2017-09-20 DIAGNOSIS — E1165 Type 2 diabetes mellitus with hyperglycemia: Principal | ICD-10-CM

## 2017-09-20 DIAGNOSIS — Z794 Long term (current) use of insulin: Principal | ICD-10-CM

## 2017-09-20 MED ORDER — INSULIN GLARGINE 100 UNIT/ML SOLOSTAR PEN: PEN_INJECTOR | SUBCUTANEOUS | 1 refills | Status: DC

## 2017-09-20 MED ORDER — INSULIN REGULAR HUMAN 100 UNIT/ML IJ SOLN: 12.0000 [IU] | Freq: Three times a day (TID) | INTRAMUSCULAR | 6 refills | Status: DC

## 2017-09-20 NOTE — Telephone Encounter (Signed)
Patient requested both Insulins to be refilled. Faxed to pharmacy.

## 2017-09-26 ENCOUNTER — Other Ambulatory Visit: Payer: Self-pay | Admitting: Internal Medicine

## 2017-09-27 NOTE — Telephone Encounter (Signed)
rx sent to pharmacy by e-script  

## 2017-10-15 ENCOUNTER — Telehealth: Payer: Self-pay | Admitting: *Deleted

## 2017-10-15 NOTE — Telephone Encounter (Signed)
Pt has lab appt on 08/26/2018 and there are no lab orders, please advise

## 2017-10-15 NOTE — Telephone Encounter (Signed)
No labs were meant to be ordered.  We will do labs same day.  Please cancel lab appt and inform pt.

## 2017-10-18 NOTE — Telephone Encounter (Signed)
Spoke with patient and advised results   

## 2017-10-18 NOTE — Telephone Encounter (Signed)
Tried calling pt to advise I canceled his lab appt, phone just rang, VM has not been set up yet, will try again later.

## 2017-10-28 ENCOUNTER — Other Ambulatory Visit: Payer: Self-pay | Admitting: Internal Medicine

## 2017-10-28 ENCOUNTER — Other Ambulatory Visit: Payer: Self-pay | Admitting: *Deleted

## 2017-10-28 ENCOUNTER — Other Ambulatory Visit: Payer: Medicare HMO

## 2017-10-28 DIAGNOSIS — F5104 Psychophysiologic insomnia: Secondary | ICD-10-CM

## 2017-10-28 DIAGNOSIS — J432 Centrilobular emphysema: Secondary | ICD-10-CM

## 2017-10-28 NOTE — Telephone Encounter (Signed)
A medication refill was received from pharmacy for zolpidem 5 mg, lorazepam 1 mg, and symbicort.  Rx was called in to pharmacy after verifying last fill date, provider, and quantity on PMP [place].

## 2017-11-01 ENCOUNTER — Encounter: Payer: Self-pay | Admitting: Internal Medicine

## 2017-11-01 ENCOUNTER — Ambulatory Visit (INDEPENDENT_AMBULATORY_CARE_PROVIDER_SITE_OTHER): Payer: Medicare HMO | Admitting: Internal Medicine

## 2017-11-01 VITALS — BP 160/80 | HR 85 | Temp 98.3°F | Wt 190.0 lb

## 2017-11-01 DIAGNOSIS — E1165 Type 2 diabetes mellitus with hyperglycemia: Secondary | ICD-10-CM | POA: Insufficient documentation

## 2017-11-01 DIAGNOSIS — J449 Chronic obstructive pulmonary disease, unspecified: Secondary | ICD-10-CM

## 2017-11-01 DIAGNOSIS — I10 Essential (primary) hypertension: Secondary | ICD-10-CM | POA: Diagnosis not present

## 2017-11-01 DIAGNOSIS — G4700 Insomnia, unspecified: Secondary | ICD-10-CM

## 2017-11-01 DIAGNOSIS — R7401 Elevation of levels of liver transaminase levels: Secondary | ICD-10-CM

## 2017-11-01 DIAGNOSIS — Z7189 Other specified counseling: Secondary | ICD-10-CM | POA: Diagnosis not present

## 2017-11-01 DIAGNOSIS — E782 Mixed hyperlipidemia: Secondary | ICD-10-CM | POA: Diagnosis not present

## 2017-11-01 DIAGNOSIS — N401 Enlarged prostate with lower urinary tract symptoms: Secondary | ICD-10-CM

## 2017-11-01 DIAGNOSIS — R350 Frequency of micturition: Secondary | ICD-10-CM

## 2017-11-01 DIAGNOSIS — R740 Nonspecific elevation of levels of transaminase and lactic acid dehydrogenase [LDH]: Secondary | ICD-10-CM | POA: Diagnosis not present
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
## 2017-11-01 NOTE — ACP (Advance Care Planning) (Addendum)
Met with patient himself about ACP today.   We do not have the copy of his HCPOA which is his wife.   He reports that he would not want heroics if he had a cardiopulmonary arrest.  He wants to maintain a good quality of life.  He does not think he'd have this if he did not go the natural way. DNR form completed today and order entered in epic. Spent 17 mins discussing data, his medical conditions and advance care planning.

## 2017-11-01 NOTE — Patient Instructions (Signed)
Please bring [place] a copy of your living will and health care of attorney.

## 2017-11-02 LAB — COMPLETE METABOLIC PANEL WITH GFR
AG Ratio: 1.9 (calc) (ref 1.0–2.5)
ALT: 53 U/L — ABNORMAL HIGH (ref 9–46)
AST: 33 U/L (ref 10–35)
Albumin: 4.6 g/dL (ref 3.6–5.1)
Alkaline phosphatase (APISO): 53 U/L (ref 40–115)
BUN: 9 mg/dL (ref 7–25)
CO2: 32 mmol/L (ref 20–32)
Calcium: 9.9 mg/dL (ref 8.6–10.3)
Chloride: 100 mmol/L (ref 98–110)
Creat: 0.86 mg/dL (ref 0.70–1.25)
GFR, Est African American: 103 mL/min/{1.73_m2} (ref >=60)
GFR, Est Non African American: 89 mL/min/{1.73_m2} (ref >=60)
Globulin: 2.4 g/dL (calc) (ref 1.9–3.7)
Glucose, Bld: 241 mg/dL — ABNORMAL HIGH (ref 65–139)
Potassium: 5.7 mmol/L — ABNORMAL HIGH (ref 3.5–5.3)
Sodium: 138 mmol/L (ref 135–146)
Total Bilirubin: 0.4 mg/dL (ref 0.2–1.2)
Total Protein: 7 g/dL (ref 6.1–8.1)

## 2017-11-02 LAB — CBC WITH DIFFERENTIAL/PLATELET
Basophils Absolute: 110 cells/uL (ref 0–200)
Basophils Relative: 1.5 %
Eosinophils Absolute: 168 cells/uL (ref 15–500)
Eosinophils Relative: 2.3 %
HCT: 45.7 % (ref 38.5–50.0)
Hemoglobin: 15.7 g/dL (ref 13.2–17.1)
Lymphs Abs: 1365 cells/uL (ref 850–3900)
MCH: 29.1 pg (ref 27.0–33.0)
MCHC: 34.4 g/dL (ref 32.0–36.0)
MCV: 84.8 fL (ref 80.0–100.0)
MPV: 11.5 fL (ref 7.5–12.5)
Monocytes Relative: 7.6 %
Neutro Abs: 5103 cells/uL (ref 1500–7800)
Neutrophils Relative %: 69.9 %
Platelets: 236 10*3/uL (ref 140–400)
RBC: 5.39 10*6/uL (ref 4.20–5.80)
RDW: 13.3 % (ref 11.0–15.0)
Total Lymphocyte: 18.7 %
WBC mixed population: 555 cells/uL (ref 200–950)
WBC: 7.3 10*3/uL (ref 3.8–10.8)

## 2017-11-02 LAB — HEMOGLOBIN A1C
Hgb A1c MFr Bld: 7.9 % of total Hgb — ABNORMAL HIGH (ref <5.7)
Mean Plasma Glucose: 180 (calc)
eAG (mmol/L): 10 (calc)

## 2017-11-15 ENCOUNTER — Telehealth: Payer: Self-pay | Admitting: Internal Medicine

## 2017-11-15 NOTE — Telephone Encounter (Signed)
I called the patient to schedule AWV-S (last AWV 11/19/16).  There was no answer, and the voicemail hasn't been set up yet. VDM (DD)

## 2017-11-29 ENCOUNTER — Encounter: Payer: Self-pay | Admitting: Gastroenterology

## 2017-12-03 ENCOUNTER — Other Ambulatory Visit: Payer: Self-pay | Admitting: Internal Medicine

## 2017-12-03 DIAGNOSIS — F5104 Psychophysiologic insomnia: Secondary | ICD-10-CM

## 2017-12-06 ENCOUNTER — Other Ambulatory Visit: Payer: Self-pay | Admitting: *Deleted

## 2017-12-06 ENCOUNTER — Other Ambulatory Visit: Payer: Self-pay | Admitting: Internal Medicine

## 2017-12-06 DIAGNOSIS — F5104 Psychophysiologic insomnia: Secondary | ICD-10-CM

## 2017-12-06 NOTE — Telephone Encounter (Signed)
rx called into pharmacy

## 2018-01-17 ENCOUNTER — Other Ambulatory Visit: Payer: Self-pay | Admitting: Internal Medicine

## 2018-01-31 ENCOUNTER — Other Ambulatory Visit: Payer: Self-pay | Admitting: Internal Medicine

## 2018-02-28 ENCOUNTER — Other Ambulatory Visit: Payer: Medicare HMO

## 2018-02-28 ENCOUNTER — Ambulatory Visit (INDEPENDENT_AMBULATORY_CARE_PROVIDER_SITE_OTHER): Payer: Medicare HMO

## 2018-02-28 VITALS — BP 136/64 | HR 81 | Temp 98.6°F | Ht 67.0 in | Wt 197.0 lb

## 2018-02-28 DIAGNOSIS — Z0000 Encounter for general adult medical examination without abnormal findings: Secondary | ICD-10-CM | POA: Diagnosis not present

## 2018-02-28 MED ORDER — TETANUS-DIPHTH-ACELL PERTUSSIS 5-2.5-18.5 LF-MCG/0.5 IM SUSP: 0.5000 mL | Freq: Once | INTRAMUSCULAR | 0 refills | Status: AC

## 2018-02-28 NOTE — Patient Instructions (Addendum)
[name] , Thank you for taking time to come for your Medicare Wellness Visit. I appreciate your ongoing commitment to your health goals. Please review the following plan we discussed and let me know if I can assist you in the future.   Screening recommendations/referrals: Colonoscopy up to date, due 04/08/2027 Recommended yearly ophthalmology/optometry visit for glaucoma screening and checkup Recommended yearly dental visit for hygiene and checkup  Vaccinations: Influenza vaccine up to date, due 2019 fall season Pneumococcal vaccine up to date, completed Tdap vaccine due, ordered to pharmacy Shingles vaccine due, please let us know the date of your second shot once you get it    Advanced directives: Advance directive discussed with you today. I have provided a copy for you to complete at home and have notarized. Once this is complete please bring a copy in to our office so we can scan it into your chart.  Conditions/risks identified: none  Next appointment: Dr. Mariea Dominguez 03/03/2018 @ 2:30pm             Matthew Dense, RN 03/03/2019 @ 10am  Preventive Care 29 Years and Older, Male Preventive care refers to lifestyle choices and visits with your health care provider that can promote health and wellness. What does preventive care include?  A yearly physical exam. This is also called an annual well check.  Dental exams once or twice a year.  Routine eye exams. Ask your health care provider how often you should have your eyes checked.  Personal lifestyle choices, including:  Daily care of your teeth and gums.  Regular physical activity.  Eating a healthy diet.  Avoiding tobacco and drug use.  Limiting alcohol use.  Practicing safe sex.  Taking low doses of aspirin every day.  Taking vitamin and mineral supplements as recommended by your health care provider. What happens during an annual well check? The services and screenings done by your health care provider during your annual  well check will depend on your age, overall health, lifestyle risk factors, and family history of disease. Counseling  Your health care provider may ask you questions about your:  Alcohol use.  Tobacco use.  Drug use.  Emotional well-being.  Home and relationship well-being.  Sexual activity.  Eating habits.  History of falls.  Memory and ability to understand (cognition).  Work and work Statistician. Screening  You may have the following tests or measurements:  Height, weight, and BMI.  Blood pressure.  Lipid and cholesterol levels. These may be checked every 5 years, or more frequently if you are over 91 years old.  Skin check.  Lung cancer screening. You may have this screening every year starting at age 18 if you have a 30-pack-year history of smoking and currently smoke or have quit within the past 15 years.  Fecal occult blood test (FOBT) of the stool. You may have this test every year starting at age 54.  Flexible sigmoidoscopy or colonoscopy. You may have a sigmoidoscopy every 5 years or a colonoscopy every 10 years starting at age 85.  Prostate cancer screening. Recommendations will vary depending on your family history and other risks.  Hepatitis C blood test.  Hepatitis B blood test.  Sexually transmitted disease (STD) testing.  Diabetes screening. This is done by checking your blood sugar (glucose) after you have not eaten for a while (fasting). You may have this done every 1-3 years.  Abdominal aortic aneurysm (AAA) screening. You may need this if you are a current or former smoker.  Osteoporosis. You may be screened starting at age 52 if you are at high risk. Talk with your health care provider about your test results, treatment options, and if necessary, the need for more tests. Vaccines  Your health care provider may recommend certain vaccines, such as:  Influenza vaccine. This is recommended every year.  Tetanus, diphtheria, and acellular  pertussis (Tdap, Td) vaccine. You may need a Td booster every 10 years.  Zoster vaccine. You may need this after age 68.  Pneumococcal 13-valent conjugate (PCV13) vaccine. One dose is recommended after age 24.  Pneumococcal polysaccharide (PPSV23) vaccine. One dose is recommended after age 25. Talk to your health care provider about which screenings and vaccines you need and how often you need them. This information is not intended to replace advice given to you by your health care provider. Make sure you discuss any questions you have with your health care provider. Document Released: 09/20/2015 Document Revised: 05/13/2016 Document Reviewed: 06/25/2015 Elsevier Interactive Patient Education  2017 [employer] Prevention in the Home Falls can cause injuries. They can happen to people of all ages. There are many things you can do to make your home safe and to help prevent falls. What can I do on the outside of my home?  Regularly fix the edges of walkways and driveways and fix any cracks.  Remove anything that might make you trip as you walk through a door, such as a raised step or threshold.  Trim any bushes or trees on the path to your home.  Use bright outdoor lighting.  Clear any walking paths of anything that might make someone trip, such as rocks or tools.  Regularly check to see if handrails are loose or broken. Make sure that both sides of any steps have handrails.  Any raised decks and porches should have guardrails on the edges.  Have any leaves, snow, or ice cleared regularly.  Use sand or salt on walking paths during winter.  Clean up any spills in your garage right away. This includes oil or grease spills. What can I do in the bathroom?  Use night lights.  Install grab bars by the toilet and in the tub and shower. Do not use towel bars as grab bars.  Use non-skid mats or decals in the tub or shower.  If you need to sit down in the shower, use a plastic,  non-slip stool.  Keep the floor dry. Clean up any water that spills on the floor as soon as it happens.  Remove soap buildup in the tub or shower regularly.  Attach bath mats securely with double-sided non-slip rug tape.  Do not have throw rugs and other things on the floor that can make you trip. What can I do in the bedroom?  Use night lights.  Make sure that you have a light by your bed that is easy to reach.  Do not use any sheets or blankets that are too big for your bed. They should not hang down onto the floor.  Have a firm chair that has side arms. You can use this for support while you get dressed.  Do not have throw rugs and other things on the floor that can make you trip. What can I do in the kitchen?  Clean up any spills right away.  Avoid walking on wet floors.  Keep items that you use a lot in easy-to-reach places.  If you need to reach something above you, use a strong step  stool that has a grab bar.  Keep electrical cords out of the way.  Do not use floor polish or wax that makes floors slippery. If you must use wax, use non-skid floor wax.  Do not have throw rugs and other things on the floor that can make you trip. What can I do with my stairs?  Do not leave any items on the stairs.  Make sure that there are handrails on both sides of the stairs and use them. Fix handrails that are broken or loose. Make sure that handrails are as long as the stairways.  Check any carpeting to make sure that it is firmly attached to the stairs. Fix any carpet that is loose or worn.  Avoid having throw rugs at the top or bottom of the stairs. If you do have throw rugs, attach them to the floor with carpet tape.  Make sure that you have a light switch at the top of the stairs and the bottom of the stairs. If you do not have them, ask someone to add them for you. What else can I do to help prevent falls?  Wear shoes that:  Do not have high heels.  Have rubber  bottoms.  Are comfortable and fit you well.  Are closed at the toe. Do not wear sandals.  If you use a stepladder:  Make sure that it is fully opened. Do not climb a closed stepladder.  Make sure that both sides of the stepladder are locked into place.  Ask someone to hold it for you, if possible.  Clearly mark and make sure that you can see:  Any grab bars or handrails.  First and last steps.  Where the edge of each step is.  Use tools that help you move around (mobility aids) if they are needed. These include:  Canes.  Walkers.  Scooters.  Crutches.  Turn on the lights when you go into a dark area. Replace any light bulbs as soon as they burn out.  Set up your furniture so you have a clear path. Avoid moving your furniture around.  If any of your floors are uneven, fix them.  If there are any pets around you, be aware of where they are.  Review your medicines with your doctor. Some medicines can make you feel dizzy. This can increase your chance of falling. Ask your doctor what other things that you can do to help prevent falls. This information is not intended to replace advice given to you by your health care provider. Make sure you discuss any questions you have with your health care provider. Document Released: 06/20/2009 Document Revised: 01/30/2016 Document Reviewed: 09/28/2014 Elsevier Interactive Patient Education  2017 [employer].
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
## 2018-03-03 ENCOUNTER — Encounter: Payer: Self-pay | Admitting: Internal Medicine

## 2018-03-03 ENCOUNTER — Telehealth: Payer: Self-pay

## 2018-03-03 ENCOUNTER — Ambulatory Visit (INDEPENDENT_AMBULATORY_CARE_PROVIDER_SITE_OTHER): Payer: Medicare HMO | Admitting: Internal Medicine

## 2018-03-03 VITALS — BP 120/70 | HR 86 | Temp 98.2°F | Ht 67.0 in | Wt 199.0 lb

## 2018-03-03 DIAGNOSIS — J449 Chronic obstructive pulmonary disease, unspecified: Secondary | ICD-10-CM | POA: Diagnosis not present

## 2018-03-03 DIAGNOSIS — E1169 Type 2 diabetes mellitus with other specified complication: Secondary | ICD-10-CM

## 2018-03-03 DIAGNOSIS — I10 Essential (primary) hypertension: Secondary | ICD-10-CM | POA: Diagnosis not present

## 2018-03-03 DIAGNOSIS — R740 Nonspecific elevation of levels of transaminase and lactic acid dehydrogenase [LDH]: Secondary | ICD-10-CM | POA: Diagnosis not present

## 2018-03-03 DIAGNOSIS — R600 Localized edema: Secondary | ICD-10-CM | POA: Diagnosis not present

## 2018-03-03 DIAGNOSIS — Z794 Long term (current) use of insulin: Secondary | ICD-10-CM

## 2018-03-03 DIAGNOSIS — E782 Mixed hyperlipidemia: Secondary | ICD-10-CM

## 2018-03-03 DIAGNOSIS — E6609 Other obesity due to excess calories: Secondary | ICD-10-CM

## 2018-03-03 DIAGNOSIS — F5104 Psychophysiologic insomnia: Secondary | ICD-10-CM

## 2018-03-03 DIAGNOSIS — Z6831 Body mass index (BMI) 31.0-31.9, adult: Secondary | ICD-10-CM

## 2018-03-03 MED ORDER — LORAZEPAM 1 MG PO TABS: 1.0000 mg | ORAL_TABLET | Freq: Every evening | ORAL | 5 refills | Status: DC | PRN

## 2018-03-03 NOTE — Telephone Encounter (Signed)
Patient's wife called to inform Dr.[name] and her [place] that patient will not be in to get fasting labs this am (prior to 2:30 pm appointment). Patient felt as though his sugar had bottomed out about 4 am and ate. Patient will not be able to fast until 2:30 pm.  Patient will intend on getting fasting labs at a later date   Karena Addison was verbally informed.

## 2018-03-03 NOTE — Patient Instructions (Signed)
Fat and Cholesterol Restricted Diet Getting too much fat and cholesterol in your diet may cause health problems. Following this diet helps keep your fat and cholesterol at normal levels. This can keep you from getting sick. What types of fat should I choose?  Choose monosaturated and polyunsaturated fats. These are found in foods such as olive oil, canola oil, flaxseeds, walnuts, almonds, and seeds.  Eat more omega-3 fats. Good choices include salmon, mackerel, sardines, tuna, flaxseed oil, and ground flaxseeds.  Limit saturated fats. These are in animal products such as meats, butter, and cream. They can also be in plant products such as palm oil, palm kernel oil, and coconut oil.  Avoid foods with partially hydrogenated oils in them. These contain trans fats. Examples of foods that have trans fats are stick margarine, some tub margarines, cookies, crackers, and other baked goods. What general guidelines do I need to follow?  Check food labels. Look for the words "trans fat" and "saturated fat."  When preparing a meal: ? Fill half of your plate with vegetables and green salads. ? Fill one fourth of your plate with whole grains. Look for the word "whole" as the first word in the ingredient list. ? Fill one fourth of your plate with lean protein foods.  Eat more foods that have fiber, like apples, carrots, beans, peas, and barley.  Eat more home-cooked foods. Eat less at restaurants and buffets.  Limit or avoid alcohol.  Limit foods high in starch and sugar.  Limit fried foods.  Cook foods without frying them. Baking, boiling, grilling, and broiling are all great options.  Lose weight if you are overweight. Losing even a small amount of weight can help your overall health. It can also help prevent diseases such as diabetes and heart disease. What foods can I eat? Grains Whole grains, such as whole wheat or whole grain breads, crackers, cereals, and pasta. Unsweetened oatmeal,  bulgur, barley, quinoa, or brown rice. Corn or whole wheat flour tortillas. Vegetables Fresh or frozen vegetables (raw, steamed, roasted, or grilled). Green salads. Fruits All fresh, canned (in natural juice), or frozen fruits. Meat and Other Protein Products Ground beef (85% or leaner), grass-fed beef, or beef trimmed of fat. Skinless chicken or turkey. Ground chicken or turkey. Pork trimmed of fat. All fish and seafood. Eggs. Dried beans, peas, or lentils. Unsalted nuts or seeds. Unsalted canned or dry beans. Dairy Low-fat dairy products, such as skim or 1% milk, 2% or reduced-fat cheeses, low-fat ricotta or cottage cheese, or plain low-fat yogurt. Fats and Oils Tub margarines without trans fats. Light or reduced-fat mayonnaise and salad dressings. Avocado. Olive, canola, sesame, or safflower oils. Natural peanut or almond butter (choose ones without added sugar and oil). The items listed above may not be a complete list of recommended foods or beverages. Contact your dietitian for more options. What foods are not recommended? Grains White bread. White pasta. White rice. Cornbread. Bagels, pastries, and croissants. Crackers that contain trans fat. Vegetables White potatoes. Corn. Creamed or fried vegetables. Vegetables in a cheese sauce. Fruits Dried fruits. Canned fruit in light or heavy syrup. Fruit juice. Meat and Other Protein Products Fatty cuts of meat. Ribs, chicken wings, bacon, sausage, bologna, salami, chitterlings, fatback, hot dogs, bratwurst, and packaged luncheon meats. Liver and organ meats. Dairy Whole or 2% milk, cream, half-and-half, and cream cheese. Whole milk cheeses. Whole-fat or sweetened yogurt. Full-fat cheeses. Nondairy creamers and whipped toppings. Processed cheese, cheese spreads, or cheese curds. Sweets and Desserts Corn   syrup, sugars, honey, and molasses. Candy. Jam and jelly. Syrup. Sweetened cereals. Cookies, pies, cakes, donuts, muffins, and ice  cream. Fats and Oils Butter, stick margarine, lard, shortening, ghee, or bacon fat. Coconut, palm kernel, or palm oils. Beverages Alcohol. Sweetened drinks (such as sodas, lemonade, and fruit drinks or punches). The items listed above may not be a complete list of foods and beverages to avoid. Contact your dietitian for more information. This information is not intended to replace advice given to you by your health care provider. Make sure you discuss any questions you have with your health care provider. Document Released: 02/23/2012 Document Revised: 04/30/2016 Document Reviewed: 11/23/2013 Elsevier Interactive Patient Education  2018 [employer] Inc.  
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
## 2018-03-04 ENCOUNTER — Encounter: Payer: Self-pay | Admitting: *Deleted

## 2018-03-06 LAB — CBC WITH DIFFERENTIAL/PLATELET
Basophils Absolute: 77 cells/uL (ref 0–200)
Basophils Relative: 0.7 %
Eosinophils Absolute: 44 cells/uL (ref 15–500)
Eosinophils Relative: 0.4 %
HCT: 44.4 % (ref 38.5–50.0)
Hemoglobin: 15.1 g/dL (ref 13.2–17.1)
Lymphs Abs: 1276 cells/uL (ref 850–3900)
MCH: 29 pg (ref 27.0–33.0)
MCHC: 34 g/dL (ref 32.0–36.0)
MCV: 85.4 fL (ref 80.0–100.0)
MPV: 11.5 fL (ref 7.5–12.5)
Monocytes Relative: 6.6 %
Neutro Abs: 8877 cells/uL — ABNORMAL HIGH (ref 1500–7800)
Neutrophils Relative %: 80.7 %
Platelets: 263 10*3/uL (ref 140–400)
RBC: 5.2 10*6/uL (ref 4.20–5.80)
RDW: 13 % (ref 11.0–15.0)
Total Lymphocyte: 11.6 %
WBC mixed population: 726 cells/uL (ref 200–950)
WBC: 11 10*3/uL — ABNORMAL HIGH (ref 3.8–10.8)

## 2018-03-06 LAB — TEST AUTHORIZATION

## 2018-03-06 LAB — LIPID PANEL
Cholesterol: 202 mg/dL — ABNORMAL HIGH (ref <200)
HDL: 44 mg/dL (ref >40)
Non-HDL Cholesterol (Calc): 158 mg/dL (calc) — ABNORMAL HIGH (ref <130)
Total CHOL/HDL Ratio: 4.6 (calc) (ref <5.0)
Triglycerides: 484 mg/dL — ABNORMAL HIGH (ref <150)

## 2018-03-06 LAB — COMPLETE METABOLIC PANEL WITH GFR
AG Ratio: 1.8 (calc) (ref 1.0–2.5)
ALT: 133 U/L — ABNORMAL HIGH (ref 9–46)
AST: 93 U/L — ABNORMAL HIGH (ref 10–35)
Albumin: 4.2 g/dL (ref 3.6–5.1)
Alkaline phosphatase (APISO): 53 U/L (ref 40–115)
BUN: 14 mg/dL (ref 7–25)
CO2: 27 mmol/L (ref 20–32)
Calcium: 9.9 mg/dL (ref 8.6–10.3)
Chloride: 99 mmol/L (ref 98–110)
Creat: 1.1 mg/dL (ref 0.70–1.25)
GFR, Est African American: 79 mL/min/{1.73_m2} (ref >=60)
GFR, Est Non African American: 68 mL/min/{1.73_m2} (ref >=60)
Globulin: 2.3 g/dL (calc) (ref 1.9–3.7)
Glucose, Bld: 221 mg/dL — ABNORMAL HIGH (ref 65–99)
Potassium: 4.6 mmol/L (ref 3.5–5.3)
Sodium: 138 mmol/L (ref 135–146)
Total Bilirubin: 0.4 mg/dL (ref 0.2–1.2)
Total Protein: 6.5 g/dL (ref 6.1–8.1)

## 2018-03-06 LAB — HEPATITIS PANEL, ACUTE
Hep A IgM: NONREACTIVE
Hep B C IgM: NONREACTIVE
Hepatitis B Surface Ag: NONREACTIVE
Hepatitis C Ab: NONREACTIVE
SIGNAL TO CUT-OFF: 0 (ref <1.00)

## 2018-03-06 LAB — HEMOGLOBIN A1C
Hgb A1c MFr Bld: 7.8 % of total Hgb — ABNORMAL HIGH (ref <5.7)
Mean Plasma Glucose: 177 (calc)
eAG (mmol/L): 9.8 (calc)

## 2018-04-03 ENCOUNTER — Other Ambulatory Visit: Payer: Self-pay | Admitting: Internal Medicine

## 2018-04-10 ENCOUNTER — Other Ambulatory Visit: Payer: Self-pay | Admitting: Internal Medicine

## 2018-04-27 ENCOUNTER — Other Ambulatory Visit: Payer: Self-pay | Admitting: Internal Medicine

## 2018-05-05 ENCOUNTER — Other Ambulatory Visit: Payer: Self-pay

## 2018-05-05 MED ORDER — METFORMIN HCL 1000 MG PO TABS: 1000.0000 mg | ORAL_TABLET | Freq: Two times a day (BID) | ORAL | 1 refills | Status: DC

## 2018-05-05 NOTE — Telephone Encounter (Signed)
A medication refill was received from pharmacy for metformin 1000 mg tablets.  A prescription was sent to the pharmacy electronically.

## 2018-06-20 ENCOUNTER — Other Ambulatory Visit: Payer: Self-pay | Admitting: Internal Medicine

## 2018-06-25 ENCOUNTER — Other Ambulatory Visit: Payer: Self-pay | Admitting: Internal Medicine

## 2018-06-30 ENCOUNTER — Other Ambulatory Visit: Payer: Self-pay | Admitting: Internal Medicine

## 2018-07-01 ENCOUNTER — Other Ambulatory Visit: Payer: Self-pay | Admitting: *Deleted

## 2018-07-01 MED ORDER — INSULIN GLARGINE 100 UNIT/ML SOLOSTAR PEN: 54.0000 [IU] | PEN_INJECTOR | Freq: Every day | SUBCUTANEOUS | 1 refills | Status: DC

## 2018-07-01 NOTE — Telephone Encounter (Signed)
Patient called and stated that [facility] Never received Rx. Refaxed.

## 2018-07-07 ENCOUNTER — Ambulatory Visit (INDEPENDENT_AMBULATORY_CARE_PROVIDER_SITE_OTHER): Payer: Medicare HMO | Admitting: Internal Medicine

## 2018-07-07 ENCOUNTER — Encounter: Payer: Self-pay | Admitting: Internal Medicine

## 2018-07-07 VITALS — BP 140/70 | HR 86 | Temp 98.1°F | Ht 67.0 in | Wt 202.0 lb

## 2018-07-07 DIAGNOSIS — E6609 Other obesity due to excess calories: Secondary | ICD-10-CM | POA: Diagnosis not present

## 2018-07-07 DIAGNOSIS — Z6831 Body mass index (BMI) 31.0-31.9, adult: Secondary | ICD-10-CM | POA: Diagnosis not present

## 2018-07-07 DIAGNOSIS — R350 Frequency of micturition: Secondary | ICD-10-CM | POA: Diagnosis not present

## 2018-07-07 DIAGNOSIS — J449 Chronic obstructive pulmonary disease, unspecified: Secondary | ICD-10-CM | POA: Diagnosis not present

## 2018-07-07 DIAGNOSIS — I10 Essential (primary) hypertension: Secondary | ICD-10-CM

## 2018-07-07 DIAGNOSIS — E1169 Type 2 diabetes mellitus with other specified complication: Secondary | ICD-10-CM

## 2018-07-07 DIAGNOSIS — Z794 Long term (current) use of insulin: Secondary | ICD-10-CM | POA: Diagnosis not present

## 2018-07-07 DIAGNOSIS — Z23 Encounter for immunization: Secondary | ICD-10-CM | POA: Diagnosis not present

## 2018-07-07 DIAGNOSIS — N401 Enlarged prostate with lower urinary tract symptoms: Secondary | ICD-10-CM | POA: Diagnosis not present

## 2018-07-07 MED ORDER — FINASTERIDE 5 MG PO TABS: 5.0000 mg | ORAL_TABLET | Freq: Every day | ORAL | 3 refills | Status: DC

## 2018-07-07 NOTE — Patient Instructions (Addendum)
Please go get your second shingrix vaccine at the pharmacy next week.    Call me back with your eye doctor's name and when you go next time.  You should be seen once a year because of your diabetes.
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
## 2018-07-08 LAB — HEMOGLOBIN A1C
Hgb A1c MFr Bld: 7.9 % of total Hgb — ABNORMAL HIGH (ref <5.7)
Mean Plasma Glucose: 180 (calc)
eAG (mmol/L): 10 (calc)

## 2018-07-08 LAB — CBC WITH DIFFERENTIAL/PLATELET
Basophils Absolute: 72 cells/uL (ref 0–200)
Basophils Relative: 0.9 %
Eosinophils Absolute: 40 cells/uL (ref 15–500)
Eosinophils Relative: 0.5 %
HCT: 45.1 % (ref 38.5–50.0)
Hemoglobin: 15.2 g/dL (ref 13.2–17.1)
Lymphs Abs: 1104 cells/uL (ref 850–3900)
MCH: 28.7 pg (ref 27.0–33.0)
MCHC: 33.7 g/dL (ref 32.0–36.0)
MCV: 85.1 fL (ref 80.0–100.0)
MPV: 11.8 fL (ref 7.5–12.5)
Monocytes Relative: 7.4 %
Neutro Abs: 6192 cells/uL (ref 1500–7800)
Neutrophils Relative %: 77.4 %
Platelets: 254 10*3/uL (ref 140–400)
RBC: 5.3 10*6/uL (ref 4.20–5.80)
RDW: 13.3 % (ref 11.0–15.0)
Total Lymphocyte: 13.8 %
WBC mixed population: 592 cells/uL (ref 200–950)
WBC: 8 10*3/uL (ref 3.8–10.8)

## 2018-07-08 LAB — BASIC METABOLIC PANEL
BUN: 14 mg/dL (ref 7–25)
CO2: 26 mmol/L (ref 20–32)
Calcium: 10.5 mg/dL — ABNORMAL HIGH (ref 8.6–10.3)
Chloride: 97 mmol/L — ABNORMAL LOW (ref 98–110)
Creat: 0.96 mg/dL (ref 0.70–1.25)
Glucose, Bld: 379 mg/dL — ABNORMAL HIGH (ref 65–139)
Potassium: 5.2 mmol/L (ref 3.5–5.3)
Sodium: 134 mmol/L — ABNORMAL LOW (ref 135–146)

## 2018-08-08 ENCOUNTER — Ambulatory Visit (INDEPENDENT_AMBULATORY_CARE_PROVIDER_SITE_OTHER): Payer: Medicare HMO | Admitting: Internal Medicine

## 2018-08-08 ENCOUNTER — Encounter: Payer: Self-pay | Admitting: Internal Medicine

## 2018-08-08 VITALS — BP 180/80 | HR 106 | Temp 98.1°F | Ht 67.0 in | Wt 204.0 lb

## 2018-08-08 DIAGNOSIS — I10 Essential (primary) hypertension: Secondary | ICD-10-CM

## 2018-08-08 DIAGNOSIS — K5903 Drug induced constipation: Secondary | ICD-10-CM | POA: Diagnosis not present

## 2018-08-08 DIAGNOSIS — G8929 Other chronic pain: Secondary | ICD-10-CM | POA: Diagnosis not present

## 2018-08-08 DIAGNOSIS — M5441 Lumbago with sciatica, right side: Secondary | ICD-10-CM

## 2018-08-08 MED ORDER — SENNOSIDES-DOCUSATE SODIUM 8.6-50 MG PO TABS: 1.0000 | ORAL_TABLET | Freq: Four times a day (QID) | ORAL | 3 refills | Status: DC | PRN

## 2018-08-08 MED ORDER — OXYCODONE-ACETAMINOPHEN 5-325 MG PO TABS: 1.0000 | ORAL_TABLET | Freq: Four times a day (QID) | ORAL | 0 refills | Status: AC | PRN

## 2018-08-08 NOTE — Patient Instructions (Signed)
Increase the fiber in your diet and continue to drink plenty of water. Take 2 senokot-s when you get it.   Take a senokot-s with each percocet tablet.
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
## 2018-08-16 ENCOUNTER — Ambulatory Visit (INDEPENDENT_AMBULATORY_CARE_PROVIDER_SITE_OTHER): Payer: Medicare HMO | Admitting: Nurse Practitioner

## 2018-08-16 ENCOUNTER — Ambulatory Visit
Admission: RE | Admit: 2018-08-16 | Discharge: 2018-08-16 | Disposition: A | Discharge status: Home / Self Care | Payer: Medicare HMO | Source: Ambulatory Visit | Attending: Nurse Practitioner | Admitting: Nurse Practitioner

## 2018-08-16 ENCOUNTER — Encounter: Payer: Self-pay | Admitting: Nurse Practitioner

## 2018-08-16 VITALS — BP 132/78 | HR 98 | Temp 98.4°F | Ht 67.0 in | Wt 198.0 lb

## 2018-08-16 DIAGNOSIS — G8929 Other chronic pain: Secondary | ICD-10-CM

## 2018-08-16 DIAGNOSIS — M47816 Spondylosis without myelopathy or radiculopathy, lumbar region: Secondary | ICD-10-CM | POA: Diagnosis not present

## 2018-08-16 DIAGNOSIS — M5441 Lumbago with sciatica, right side: Secondary | ICD-10-CM

## 2018-08-16 DIAGNOSIS — M25551 Pain in right hip: Secondary | ICD-10-CM | POA: Diagnosis not present

## 2018-08-16 DIAGNOSIS — J209 Acute bronchitis, unspecified: Secondary | ICD-10-CM

## 2018-08-16 DIAGNOSIS — H1032 Unspecified acute conjunctivitis, left eye: Secondary | ICD-10-CM

## 2018-08-16 MED ORDER — DOXYCYCLINE HYCLATE 100 MG PO TABS: 100.0000 mg | ORAL_TABLET | Freq: Two times a day (BID) | ORAL | 0 refills | Status: DC

## 2018-08-16 MED ORDER — TOBRAMYCIN 0.3 % OP OINT: 1.0000 "application " | TOPICAL_OINTMENT | Freq: Three times a day (TID) | OPHTHALMIC | 0 refills | Status: AC

## 2018-08-16 MED ORDER — PREDNISONE 10 MG (21) PO TBPK: ORAL_TABLET | ORAL | 0 refills | Status: DC
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
## 2018-08-16 NOTE — Patient Instructions (Signed)
1. Acute bronchitis, unspecified organism -mucinex DM by mouth twice daily with full glass of water for 1 week - DG Chest 2 View - doxycycline (VIBRA-TABS) 100 MG tablet; Take 1 tablet (100 mg total) by mouth 2 (two) times daily.  Dispense: 14 tablet; Refill: 0 - predniSONE (STERAPRED UNI-PAK 21 TAB) 10 MG (21) TBPK tablet; Use as directed  Dispense: 21 tablet; Refill: 0 -probiotic twice daily -take doxycycline with food.   -back pain -to go to [facility] for xrays of spine with hip and pelvis - predniSONE (STERAPRED UNI-PAK 21 TAB) 10 MG (21) TBPK tablet; Use as directed  Dispense: 21 tablet; Refill: 0  -eye infection - tobramycin (TOBREX) 0.3 % ophthalmic ointment; Place 1 application into both eyes 3 (three) times daily for 7 days.  Dispense: 21 g; Refill: 0

## 2018-08-18 ENCOUNTER — Telehealth: Payer: Self-pay | Admitting: *Deleted

## 2018-08-18 DIAGNOSIS — G8929 Other chronic pain: Secondary | ICD-10-CM

## 2018-08-18 DIAGNOSIS — M5441 Lumbago with sciatica, right side: Principal | ICD-10-CM

## 2018-08-18 NOTE — Telephone Encounter (Signed)
Do you have an answer for the pain management until he can get in with a Neurosurgeon. Please Advise.

## 2018-08-18 NOTE — Telephone Encounter (Signed)
Referral was placed 

## 2018-08-18 NOTE — Telephone Encounter (Signed)
Patient called and stated that his pain is no better. Pain is across lower back and down hips. Stated he feels its getting worse.   X-ray results discussed with patient that were done on 08/16/18. Patient wishes to have the Neurosurgery referral placed.   Patient wants to know if a pain medication can be prescribed until he can get in with the Neurosurgeon. Please Advise.    Neurosurgeon Referral Pended.

## 2018-08-19 ENCOUNTER — Telehealth: Payer: Self-pay | Admitting: *Deleted

## 2018-08-19 ENCOUNTER — Other Ambulatory Visit: Payer: Self-pay | Admitting: Internal Medicine

## 2018-08-19 DIAGNOSIS — G8929 Other chronic pain: Secondary | ICD-10-CM

## 2018-08-19 DIAGNOSIS — M5441 Lumbago with sciatica, right side: Principal | ICD-10-CM

## 2018-08-19 MED ORDER — OXYCODONE-ACETAMINOPHEN 10-325 MG PO TABS: 1.0000 | ORAL_TABLET | Freq: Four times a day (QID) | ORAL | 0 refills | Status: AC | PRN

## 2018-08-19 NOTE — Telephone Encounter (Signed)
Pt was very upset that nothing has been done over the last 3 weeks, pt states he is out of percocet and will take a refill, he will have his wife call us back.

## 2018-08-19 NOTE — Telephone Encounter (Signed)
Jessica prescribed prednisone for him to help his pain (and his wheezing from bronchitis).  Has he used up the percocet he had?  If so, we can renew for another 5 day course until he has a neurosurgery eval.

## 2018-08-19 NOTE — Progress Notes (Signed)
Rx sent.  I actually did increase the dose of oxycodone to help his pain more.

## 2018-08-19 NOTE — Telephone Encounter (Signed)
Several things have been done.  This is how he got the last time he was given opioids for pain--he was irritable and hateful to our NP at another appt.  As I said, we can refill the percocet.  He's getting prednisone which is also for the back pain.  He can use tylenol over the counter, topicals, heat.  He's at risk for falling if he takes too much percocet considering his ambien, lorazepam AND benadryl use.

## 2018-08-19 NOTE — Telephone Encounter (Signed)
Matthew Dominguez, wife called and left message on Clinical Intake stating she needed to know what was going on with her Husband. She stated that he was in so much pain and very irritable. Wants to know what is being done.   Tried calling patient back at work [phone] and on Cell: [phone] Voicemail not set up and cannot leave message.

## 2018-08-19 NOTE — Telephone Encounter (Signed)
Spoke with wife. She wanted the X-Ray results and who the referral was made to [facility]) and if we called in something for his pain. (dr. Mariea Clonts called in [place])

## 2018-08-23 DIAGNOSIS — M5441 Lumbago with sciatica, right side: Secondary | ICD-10-CM | POA: Diagnosis not present

## 2018-08-23 DIAGNOSIS — R030 Elevated blood-pressure reading, without diagnosis of hypertension: Secondary | ICD-10-CM | POA: Diagnosis not present

## 2018-08-23 DIAGNOSIS — Z6825 Body mass index (BMI) 25.0-25.9, adult: Secondary | ICD-10-CM | POA: Diagnosis not present

## 2018-08-25 ENCOUNTER — Telehealth: Payer: Self-pay | Admitting: *Deleted

## 2018-08-25 DIAGNOSIS — G8929 Other chronic pain: Secondary | ICD-10-CM

## 2018-08-25 DIAGNOSIS — M5441 Lumbago with sciatica, right side: Principal | ICD-10-CM

## 2018-08-25 MED ORDER — OXYCODONE-ACETAMINOPHEN 10-325 MG PO TABS: 1.0000 | ORAL_TABLET | Freq: Four times a day (QID) | ORAL | 0 refills | Status: AC | PRN

## 2018-08-25 NOTE — Telephone Encounter (Signed)
Patient called requesting a refill on his pain medication. Has had a consultation with the Neurosurgeon. Has to go back for another appointment. Patient Is still hurting. Percocet is not in his current medication list anymore. Please Advise.

## 2018-08-25 NOTE — Telephone Encounter (Signed)
An additional 5 days was sent to his [facility].  I saw the note from neurosurgery and will be on the lookout for his MRI results.

## 2018-08-26 ENCOUNTER — Ambulatory Visit: Payer: Medicare HMO | Admitting: Nurse Practitioner

## 2018-08-29 ENCOUNTER — Other Ambulatory Visit: Payer: Self-pay | Admitting: Internal Medicine

## 2018-08-30 ENCOUNTER — Other Ambulatory Visit: Payer: Self-pay | Admitting: Internal Medicine

## 2018-09-05 ENCOUNTER — Other Ambulatory Visit: Payer: Self-pay

## 2018-09-05 MED ORDER — GLUCOSE BLOOD VI STRP: ORAL_STRIP | 3 refills | Status: DC

## 2018-09-09 ENCOUNTER — Other Ambulatory Visit: Payer: Self-pay | Admitting: Internal Medicine

## 2018-09-09 DIAGNOSIS — F5104 Psychophysiologic insomnia: Secondary | ICD-10-CM

## 2018-09-12 ENCOUNTER — Other Ambulatory Visit: Payer: Self-pay | Admitting: *Deleted

## 2018-09-12 DIAGNOSIS — F5104 Psychophysiologic insomnia: Secondary | ICD-10-CM

## 2018-09-12 MED ORDER — LORAZEPAM 1 MG PO TABS: 1.0000 mg | ORAL_TABLET | Freq: Every evening | ORAL | 0 refills | Status: DC | PRN

## 2018-09-12 NOTE — Telephone Encounter (Signed)
Patient requested refill.  [place] Verified LR: 08/14/2018 Phoned Rx to pharmacy.

## 2018-09-14 DIAGNOSIS — M5441 Lumbago with sciatica, right side: Secondary | ICD-10-CM | POA: Diagnosis not present

## 2018-09-14 DIAGNOSIS — M48061 Spinal stenosis, lumbar region without neurogenic claudication: Secondary | ICD-10-CM | POA: Diagnosis not present

## 2018-09-19 DIAGNOSIS — M5126 Other intervertebral disc displacement, lumbar region: Secondary | ICD-10-CM | POA: Diagnosis not present

## 2018-09-19 DIAGNOSIS — R030 Elevated blood-pressure reading, without diagnosis of hypertension: Secondary | ICD-10-CM | POA: Diagnosis not present

## 2018-09-29 DIAGNOSIS — M5126 Other intervertebral disc displacement, lumbar region: Secondary | ICD-10-CM | POA: Diagnosis not present

## 2018-09-29 DIAGNOSIS — R030 Elevated blood-pressure reading, without diagnosis of hypertension: Secondary | ICD-10-CM | POA: Diagnosis not present

## 2018-09-29 DIAGNOSIS — Z6828 Body mass index (BMI) 28.0-28.9, adult: Secondary | ICD-10-CM | POA: Diagnosis not present

## 2018-10-02 ENCOUNTER — Other Ambulatory Visit: Payer: Self-pay | Admitting: Internal Medicine

## 2018-10-05 DIAGNOSIS — M5416 Radiculopathy, lumbar region: Secondary | ICD-10-CM | POA: Diagnosis not present

## 2018-10-05 DIAGNOSIS — R030 Elevated blood-pressure reading, without diagnosis of hypertension: Secondary | ICD-10-CM | POA: Diagnosis not present

## 2018-10-05 DIAGNOSIS — Z6828 Body mass index (BMI) 28.0-28.9, adult: Secondary | ICD-10-CM | POA: Diagnosis not present

## 2018-10-12 ENCOUNTER — Other Ambulatory Visit: Payer: Self-pay | Admitting: Internal Medicine

## 2018-10-12 DIAGNOSIS — F5104 Psychophysiologic insomnia: Secondary | ICD-10-CM

## 2018-10-12 NOTE — Telephone Encounter (Signed)
Patient requesting refill for Lorazepam 1 mg tab. Verified in [place] data base last filled on 09/12/2018 for total of 30 tabs. Last appointment with Sherrie Mustache on 08/16/2018. Next appointment with Dr. Mariea Clonts on 11/07/2018.

## 2018-10-19 DIAGNOSIS — M5416 Radiculopathy, lumbar region: Secondary | ICD-10-CM | POA: Diagnosis not present

## 2018-11-01 ENCOUNTER — Other Ambulatory Visit: Payer: Medicare HMO

## 2018-11-06 ENCOUNTER — Other Ambulatory Visit: Payer: Self-pay | Admitting: Internal Medicine

## 2018-11-07 ENCOUNTER — Ambulatory Visit: Payer: Medicare HMO | Admitting: Internal Medicine

## 2018-11-15 ENCOUNTER — Other Ambulatory Visit: Payer: Self-pay | Admitting: Internal Medicine

## 2018-11-15 DIAGNOSIS — F5104 Psychophysiologic insomnia: Secondary | ICD-10-CM

## 2018-12-15 ENCOUNTER — Other Ambulatory Visit: Payer: Self-pay | Admitting: Internal Medicine

## 2018-12-15 DIAGNOSIS — F5104 Psychophysiologic insomnia: Secondary | ICD-10-CM

## 2018-12-26 ENCOUNTER — Other Ambulatory Visit: Payer: Self-pay | Admitting: Internal Medicine

## 2019-02-01 ENCOUNTER — Other Ambulatory Visit: Payer: Self-pay | Admitting: Internal Medicine

## 2019-02-01 DIAGNOSIS — F5104 Psychophysiologic insomnia: Secondary | ICD-10-CM

## 2019-02-02 NOTE — Telephone Encounter (Signed)
[place] Database verified and compliance confirmed   Last filled 12/15/2018

## 2019-03-03 ENCOUNTER — Ambulatory Visit (INDEPENDENT_AMBULATORY_CARE_PROVIDER_SITE_OTHER): Payer: Medicare HMO | Admitting: Family

## 2019-03-03 ENCOUNTER — Encounter: Payer: Self-pay | Admitting: Family

## 2019-03-03 ENCOUNTER — Ambulatory Visit: Payer: Self-pay

## 2019-03-03 ENCOUNTER — Other Ambulatory Visit: Payer: Self-pay

## 2019-03-03 DIAGNOSIS — Z794 Long term (current) use of insulin: Secondary | ICD-10-CM | POA: Diagnosis not present

## 2019-03-03 DIAGNOSIS — IMO0001 Reserved for inherently not codable concepts without codable children: Secondary | ICD-10-CM

## 2019-03-03 DIAGNOSIS — E1165 Type 2 diabetes mellitus with hyperglycemia: Secondary | ICD-10-CM | POA: Diagnosis not present

## 2019-03-03 DIAGNOSIS — Z0000 Encounter for general adult medical examination without abnormal findings: Secondary | ICD-10-CM

## 2019-03-03 IMAGING — CR DG LUMBAR SPINE COMPLETE 4+V
5 series · 5 of 5 positions shown · non-contrast
Comparison: Lumbar radiographs 09/30/2011

CLINICAL DATA: Chronic right-sided low back pain with right-sided
sciatica.

EXAM:
LUMBAR SPINE - COMPLETE 4+ VIEW

[w lumbar spine ap]
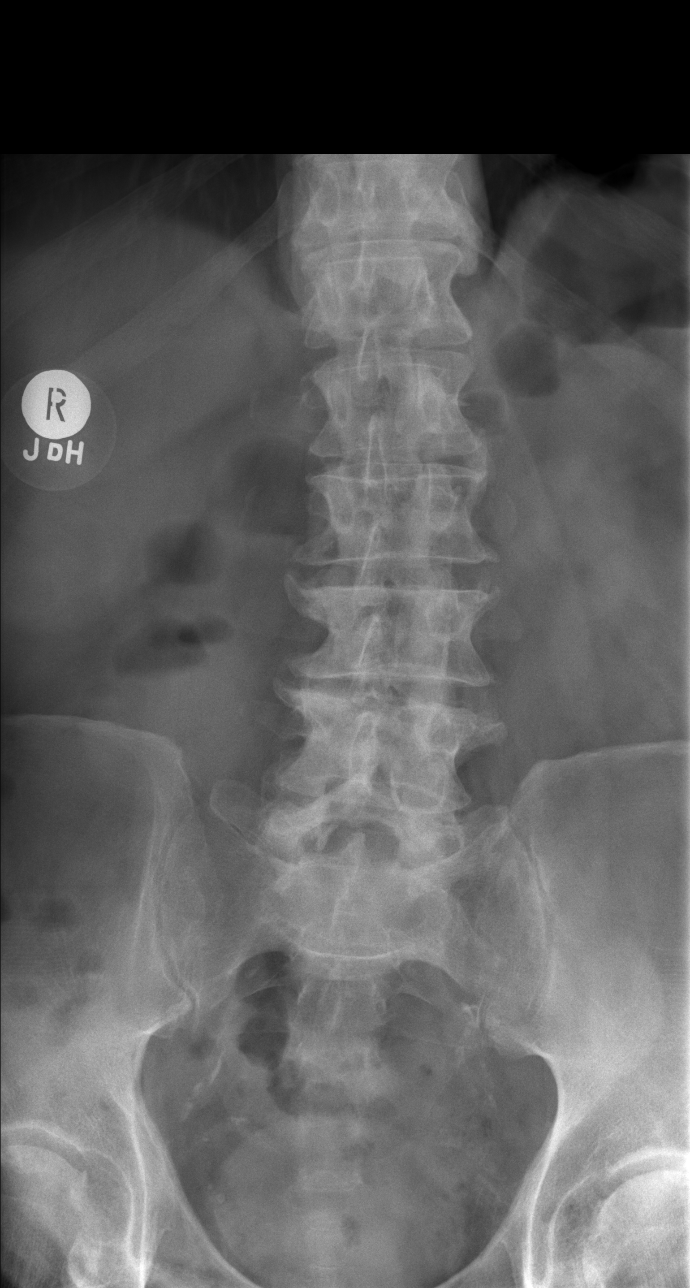

[w lumbar spine obl (1 of 2)]
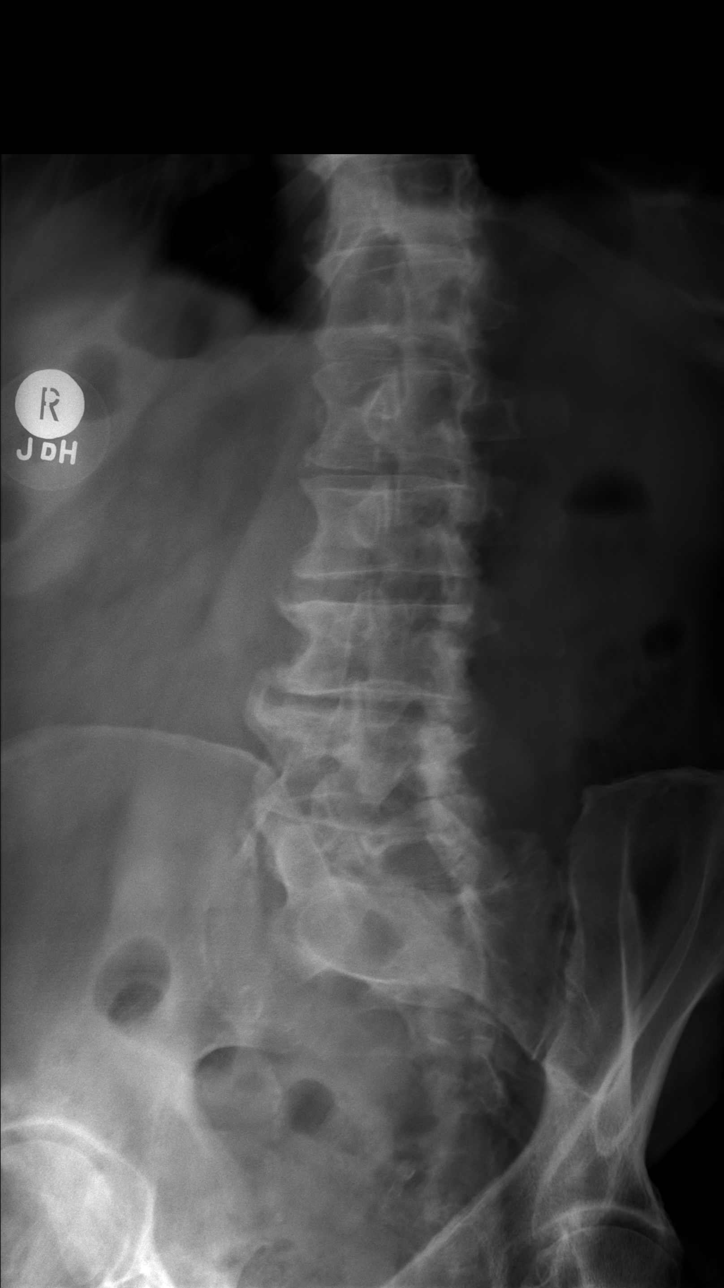

[w lumbar spine obl (2 of 2)]
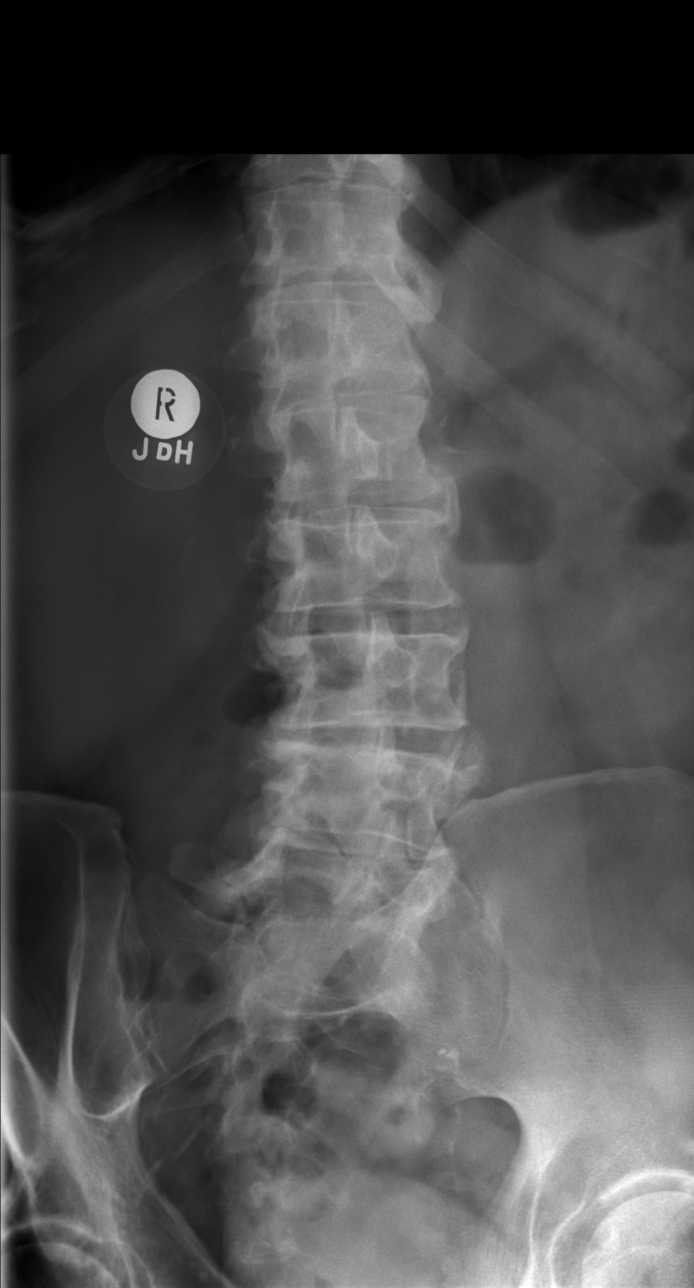

[w lumbar spine lat]
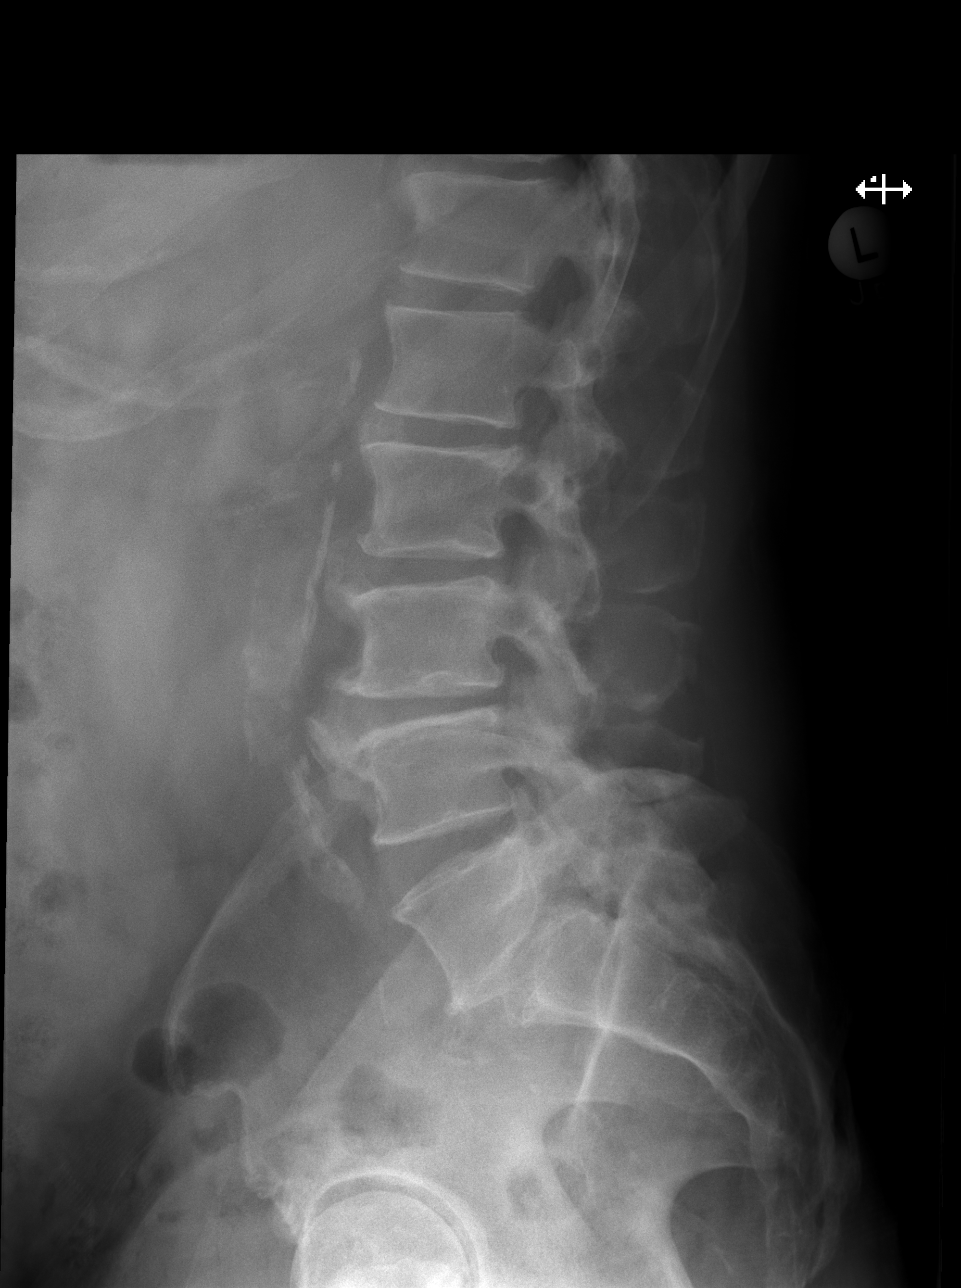

[w lumbar l-5 s-1 spot]
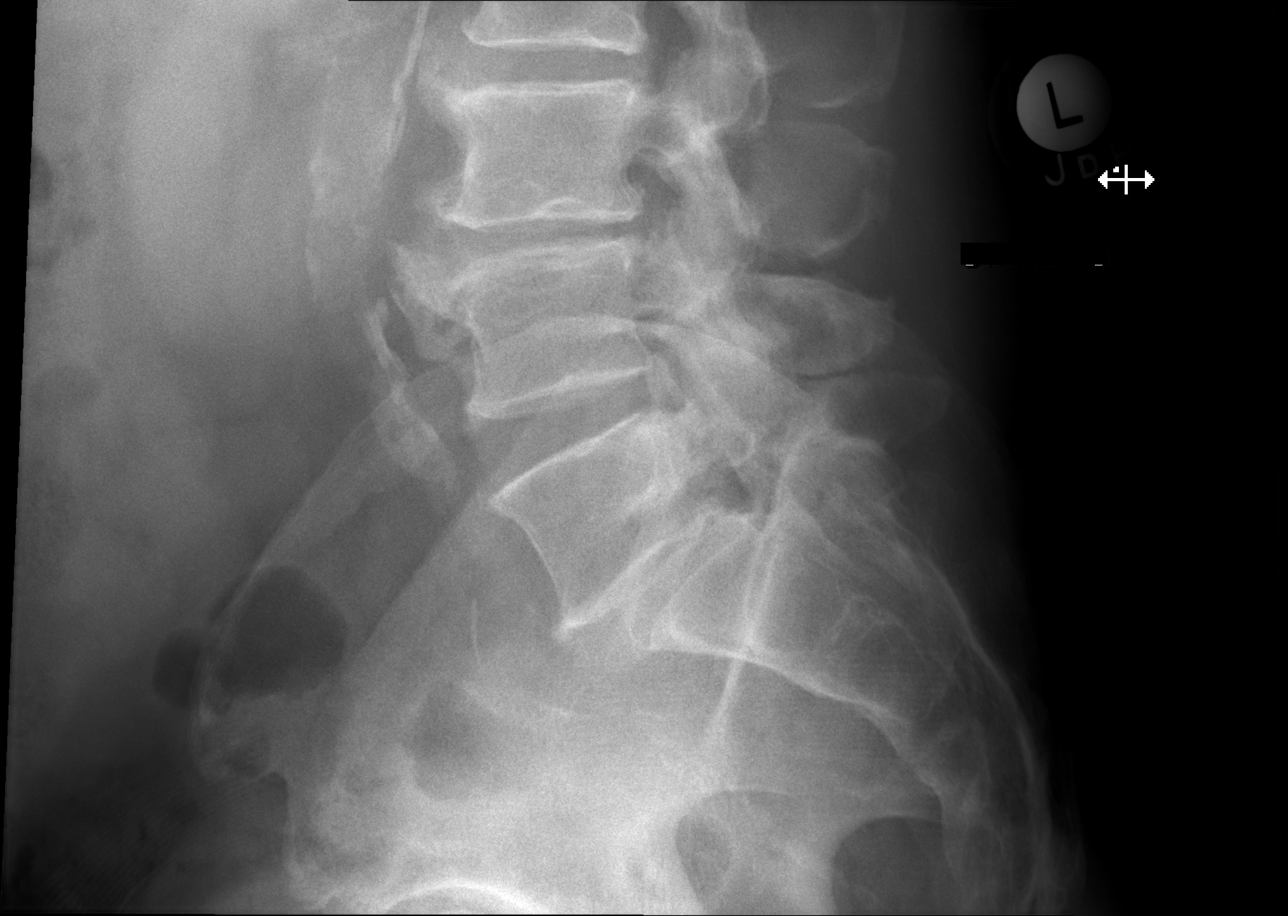

[5 of 5 positions shown; findings below may reference images not displayed]

FINDINGS: Grade 1 anterolisthesis L5-S1 with mild progression. Bilateral pars
defects of L5 again noted. Remaining alignment normal

Mild to moderate disc degeneration and mild spurring at L1-2, L2-3,
L3-4, L4-5 similar to the prior study. Negative for fracture. Mild
levoscoliosis. Mild atherosclerotic aorta.
IMPRESSION: Bilateral pars defects of L5. Mild progression of anterolisthesis
L5-S1 since 2946. Diffuse lumbar disc degeneration L1 through L5 is
similar to the prior study.

## 2019-03-03 IMAGING — CR DG HIP (WITH OR WITHOUT PELVIS) 2-3V*R*
3 series · 3 of 3 positions shown · non-contrast
Comparison: None.

CLINICAL DATA: Chronic right-sided low back pain with right-sided
sciatica

EXAM:
DG HIP (WITH OR WITHOUT PELVIS) 2-3V RIGHT

[w pelvis upright]
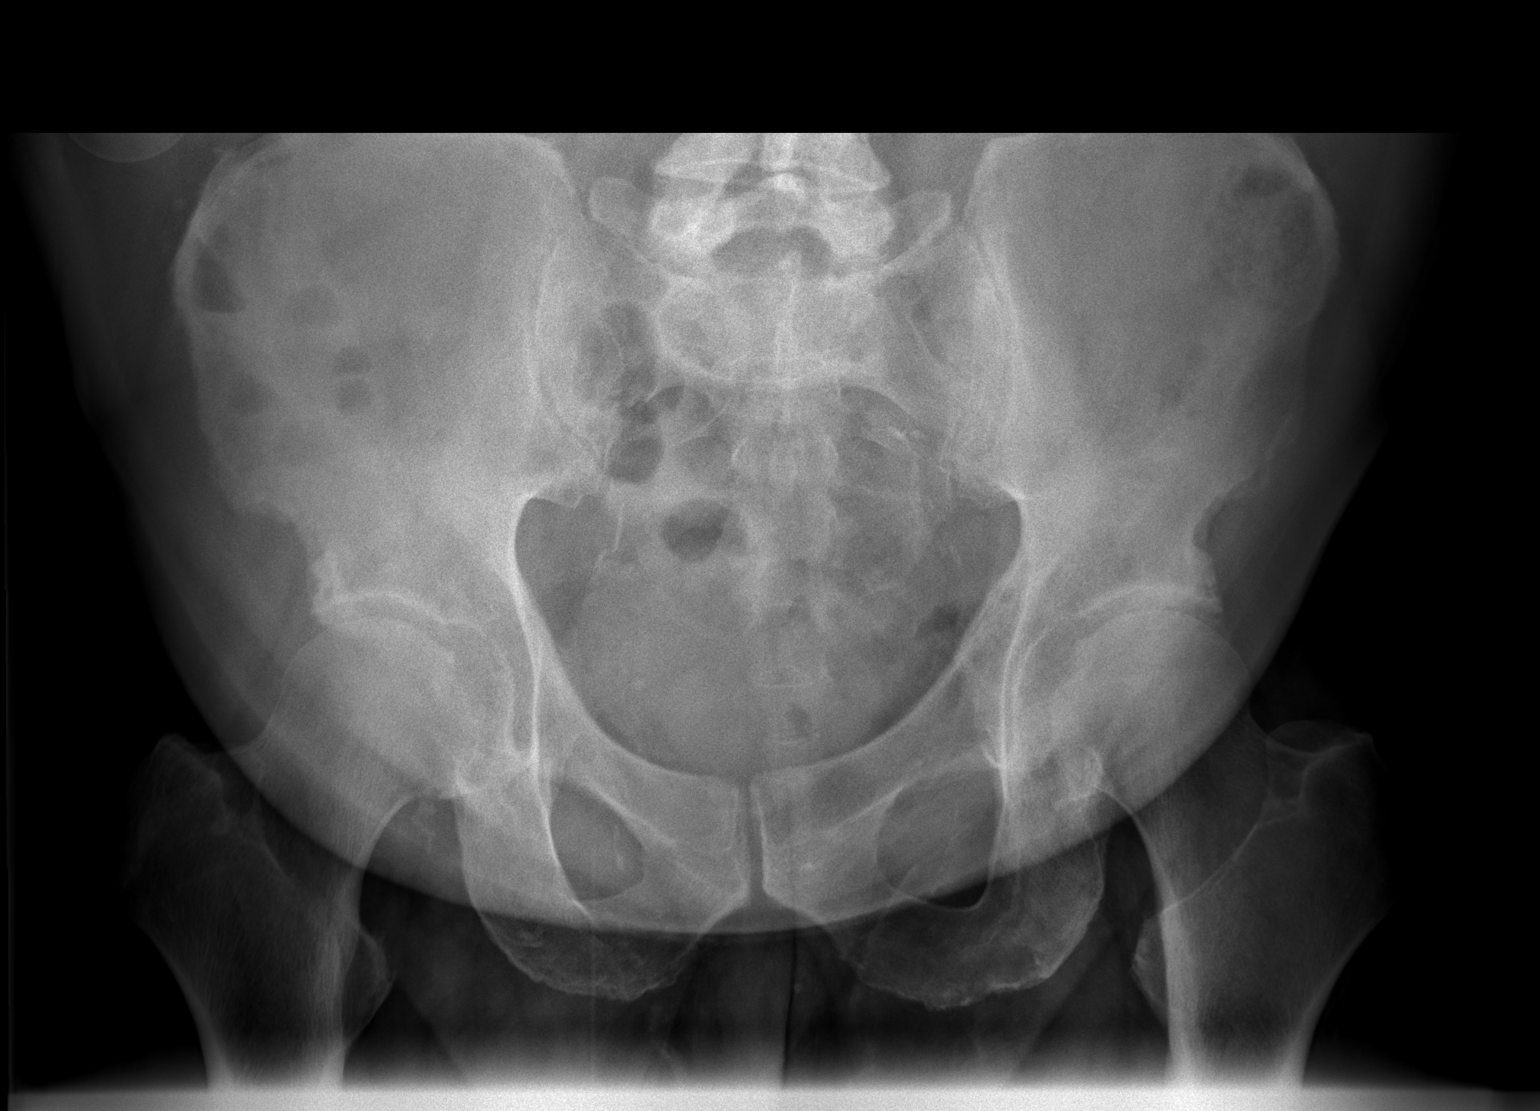

[w hip ap right]
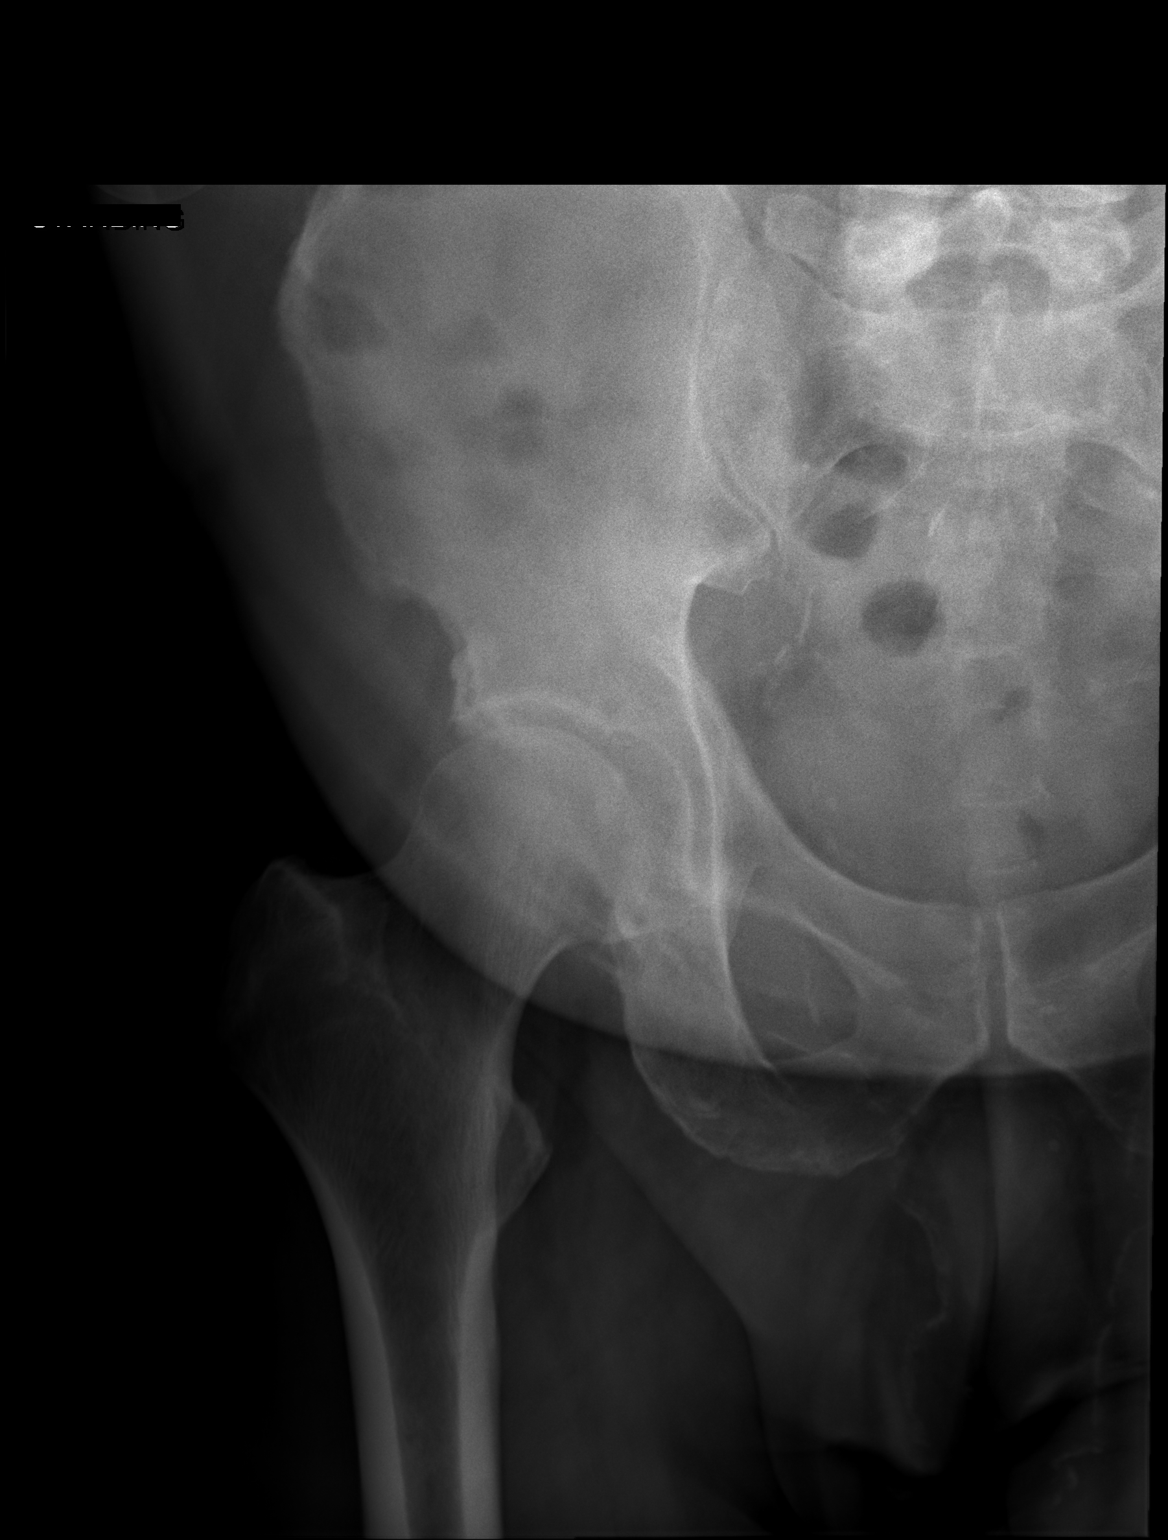

[w hip frog right]
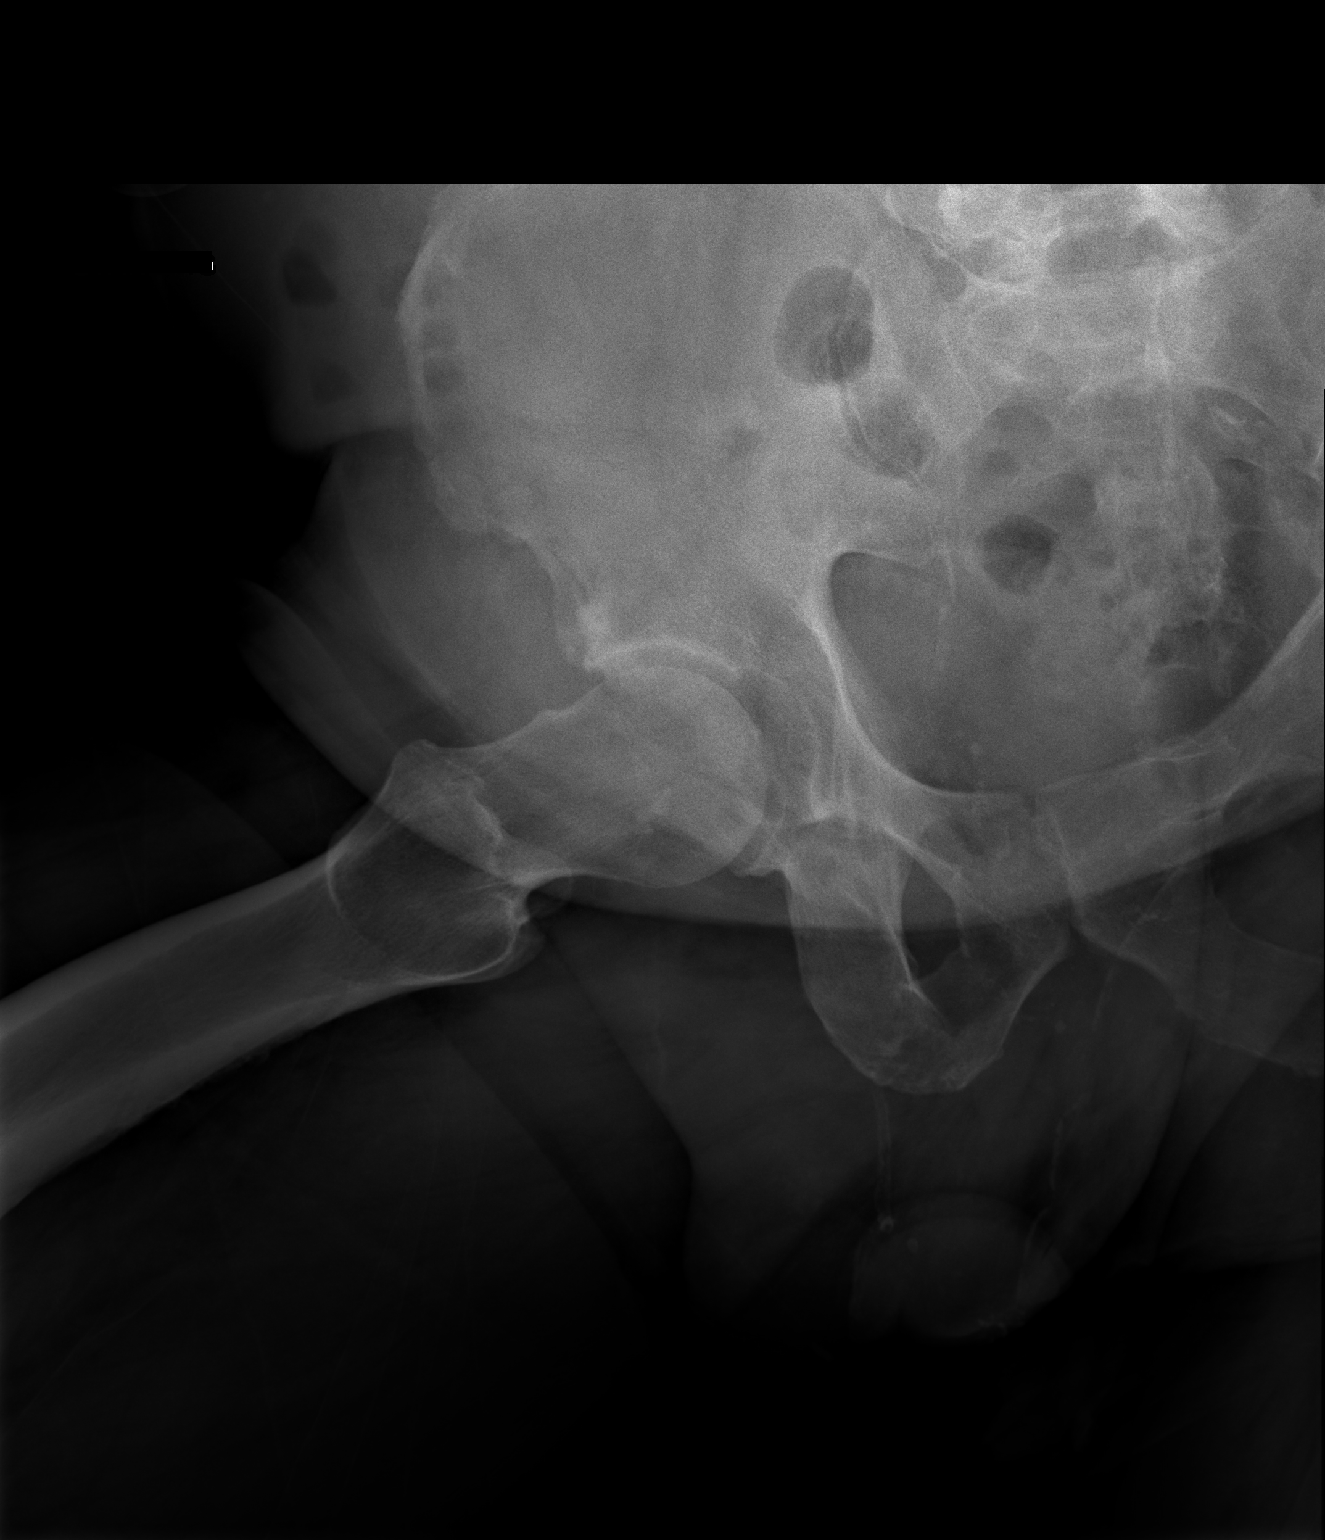

[3 of 3 positions shown; findings below may reference images not displayed]

FINDINGS: There is no evidence of hip fracture or dislocation. There is no
evidence of arthropathy or other focal bone abnormality.
IMPRESSION: Negative.

## 2019-03-03 MED ORDER — TETANUS-DIPHTH-ACELL PERTUSSIS 5-2-15.5 LF-MCG/0.5 IM SUSP: 0.5000 mL | Freq: Once | INTRAMUSCULAR | 0 refills | Status: AC

## 2019-03-03 MED ORDER — INSULIN REGULAR HUMAN 100 UNIT/ML IJ SOLN: 12.0000 [IU] | Freq: Three times a day (TID) | INTRAMUSCULAR | 6 refills | Status: DC

## 2019-03-03 NOTE — Progress Notes (Signed)
Patient ID: Matthew Dominguez, male   DOB: 05-10-1951, 68 y.o.   MRN: [id number] This service is provided via telemedicine  No vital signs collected/recorded due to the encounter was a telemedicine visit.   Location of patient (ex: home, work):  HOME   Patient consents to a telephone visit:  YES  Location of the provider (ex: office, home):  OFFICE  Name of any referring provider:  DR. Hollace Kinnier, DO  Names of all persons participating in the telemedicine service and their role in the encounter:  PATIENT, [name], [name], [name], NP  Time spent on call:  6:35

## 2019-03-03 NOTE — Patient Instructions (Signed)
[name] , Thank you for taking time to come for your Medicare Wellness Visit. I appreciate your ongoing commitment to your health goals. Please review the following plan we discussed and let me know if I can assist you in the future.   Screening recommendations/referrals: Colonoscopy: Declined  Recommended yearly ophthalmology/optometry visit for glaucoma screening and checkup Recommended yearly dental visit for hygiene and checkup  Vaccinations: Influenza vaccine: up to date  Pneumococcal vaccine; up to date Tdap vaccine : Vaccine ordered this visit send to the pharmacy.please get the Tdap at  Your pharmacy. Shingles vaccine:  up to date  Advanced directives: Yes   Conditions/risks identified: Advance age men > 4 yrs,male Gender,Hyperlipidemia,Type 2 DM,BMI > 30,Hx smoking and Tobacco dip,Hypertension   Next appointment: 1 year   Preventive Care 42 Years and Older, Male Preventive care refers to lifestyle choices and visits with your health care provider that can promote health and wellness. What does preventive care include?  A yearly physical exam. This is also called an annual well check.  Dental exams once or twice a year.  Routine eye exams. Ask your health care provider how often you should have your eyes checked.  Personal lifestyle choices, including:  Daily care of your teeth and gums.  Regular physical activity.  Eating a healthy diet.  Avoiding tobacco and drug use.  Limiting alcohol use.  Practicing safe sex.  Taking low doses of aspirin every day.  Taking vitamin and mineral supplements as recommended by your health care provider. What happens during an annual well check? The services and screenings done by your health care provider during your annual well check will depend on your age, overall health, lifestyle risk factors, and family history of disease. Counseling  Your health care provider may ask you questions about your:  Alcohol use.   Tobacco use.  Drug use.  Emotional well-being.  Home and relationship well-being.  Sexual activity.  Eating habits.  History of falls.  Memory and ability to understand (cognition).  Work and work Statistician. Screening  You may have the following tests or measurements:  Height, weight, and BMI.  Blood pressure.  Lipid and cholesterol levels. These may be checked every 5 years, or more frequently if you are over 29 years old.  Skin check.  Lung cancer screening. You may have this screening every year starting at age 59 if you have a 30-pack-year history of smoking and currently smoke or have quit within the past 15 years.  Fecal occult blood test (FOBT) of the stool. You may have this test every year starting at age 65.  Flexible sigmoidoscopy or colonoscopy. You may have a sigmoidoscopy every 5 years or a colonoscopy every 10 years starting at age 110.  Prostate cancer screening. Recommendations will vary depending on your family history and other risks.  Hepatitis C blood test.  Hepatitis B blood test.  Sexually transmitted disease (STD) testing.  Diabetes screening. This is done by checking your blood sugar (glucose) after you have not eaten for a while (fasting). You may have this done every 1-3 years.  Abdominal aortic aneurysm (AAA) screening. You may need this if you are a current or former smoker.  Osteoporosis. You may be screened starting at age 28 if you are at high risk. Talk with your health care provider about your test results, treatment options, and if necessary, the need for more tests. Vaccines  Your health care provider may recommend certain vaccines, such as:  Influenza vaccine. This  is recommended every year.  Tetanus, diphtheria, and acellular pertussis (Tdap, Td) vaccine. You may need a Td booster every 10 years.  Zoster vaccine. You may need this after age 79.  Pneumococcal 13-valent conjugate (PCV13) vaccine. One dose is recommended  after age 27.  Pneumococcal polysaccharide (PPSV23) vaccine. One dose is recommended after age 67. Talk to your health care provider about which screenings and vaccines you need and how often you need them. This information is not intended to replace advice given to you by your health care provider. Make sure you discuss any questions you have with your health care provider. Document Released: 09/20/2015 Document Revised: 05/13/2016 Document Reviewed: 06/25/2015 Elsevier Interactive Patient Education  2017 [place] Prevention in the Home Falls can cause injuries. They can happen to people of all ages. There are many things you can do to make your home safe and to help prevent falls. What can I do on the outside of my home?  Regularly fix the edges of walkways and driveways and fix any cracks.  Remove anything that might make you trip as you walk through a door, such as a raised step or threshold.  Trim any bushes or trees on the path to your home.  Use bright outdoor lighting.  Clear any walking paths of anything that might make someone trip, such as rocks or tools.  Regularly check to see if handrails are loose or broken. Make sure that both sides of any steps have handrails.  Any raised decks and porches should have guardrails on the edges.  Have any leaves, snow, or ice cleared regularly.  Use sand or salt on walking paths during winter.  Clean up any spills in your garage right away. This includes oil or grease spills. What can I do in the bathroom?  Use night lights.  Install grab bars by the toilet and in the tub and shower. Do not use towel bars as grab bars.  Use non-skid mats or decals in the tub or shower.  If you need to sit down in the shower, use a plastic, non-slip stool.  Keep the floor dry. Clean up any water that spills on the floor as soon as it happens.  Remove soap buildup in the tub or shower regularly.  Attach bath mats securely with  double-sided non-slip rug tape.  Do not have throw rugs and other things on the floor that can make you trip. What can I do in the bedroom?  Use night lights.  Make sure that you have a light by your bed that is easy to reach.  Do not use any sheets or blankets that are too big for your bed. They should not hang down onto the floor.  Have a firm chair that has side arms. You can use this for support while you get dressed.  Do not have throw rugs and other things on the floor that can make you trip. What can I do in the kitchen?  Clean up any spills right away.  Avoid walking on wet floors.  Keep items that you use a lot in easy-to-reach places.  If you need to reach something above you, use a strong step stool that has a grab bar.  Keep electrical cords out of the way.  Do not use floor polish or wax that makes floors slippery. If you must use wax, use non-skid floor wax.  Do not have throw rugs and other things on the floor that can make you  trip. What can I do with my stairs?  Do not leave any items on the stairs.  Make sure that there are handrails on both sides of the stairs and use them. Fix handrails that are broken or loose. Make sure that handrails are as long as the stairways.  Check any carpeting to make sure that it is firmly attached to the stairs. Fix any carpet that is loose or worn.  Avoid having throw rugs at the top or bottom of the stairs. If you do have throw rugs, attach them to the floor with carpet tape.  Make sure that you have a light switch at the top of the stairs and the bottom of the stairs. If you do not have them, ask someone to add them for you. What else can I do to help prevent falls?  Wear shoes that:  Do not have high heels.  Have rubber bottoms.  Are comfortable and fit you well.  Are closed at the toe. Do not wear sandals.  If you use a stepladder:  Make sure that it is fully opened. Do not climb a closed stepladder.  Make  sure that both sides of the stepladder are locked into place.  Ask someone to hold it for you, if possible.  Clearly mark and make sure that you can see:  Any grab bars or handrails.  First and last steps.  Where the edge of each step is.  Use tools that help you move around (mobility aids) if they are needed. These include:  Canes.  Walkers.  Scooters.  Crutches.  Turn on the lights when you go into a dark area. Replace any light bulbs as soon as they burn out.  Set up your furniture so you have a clear path. Avoid moving your furniture around.  If any of your floors are uneven, fix them.  If there are any pets around you, be aware of where they are.  Review your medicines with your doctor. Some medicines can make you feel dizzy. This can increase your chance of falling. Ask your doctor what other things that you can do to help prevent falls. This information is not intended to replace advice given to you by your health care provider. Make sure you discuss any questions you have with your health care provider. Document Released: 06/20/2009 Document Revised: 01/30/2016 Document Reviewed: 09/28/2014 Elsevier Interactive Patient Education  2017 [employer].
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
## 2019-03-10 ENCOUNTER — Other Ambulatory Visit: Payer: Self-pay | Admitting: Internal Medicine

## 2019-03-10 DIAGNOSIS — F5104 Psychophysiologic insomnia: Secondary | ICD-10-CM

## 2019-03-13 NOTE — Telephone Encounter (Signed)
Last filled 02/02/19, not verified in database

## 2019-03-13 NOTE — Telephone Encounter (Signed)
Verified in [employer].

## 2019-03-14 ENCOUNTER — Other Ambulatory Visit: Payer: Self-pay | Admitting: Internal Medicine

## 2019-03-27 ENCOUNTER — Other Ambulatory Visit: Payer: Self-pay | Admitting: Internal Medicine

## 2019-03-27 DIAGNOSIS — J432 Centrilobular emphysema: Secondary | ICD-10-CM

## 2019-04-01 ENCOUNTER — Other Ambulatory Visit: Payer: Self-pay | Admitting: Internal Medicine

## 2019-04-12 ENCOUNTER — Other Ambulatory Visit: Payer: Self-pay | Admitting: Internal Medicine

## 2019-04-12 DIAGNOSIS — F5104 Psychophysiologic insomnia: Secondary | ICD-10-CM

## 2019-04-12 NOTE — Telephone Encounter (Signed)
Last filled 03/13/2019

## 2019-04-13 ENCOUNTER — Other Ambulatory Visit: Payer: Self-pay | Admitting: Internal Medicine

## 2019-04-13 DIAGNOSIS — E119 Type 2 diabetes mellitus without complications: Secondary | ICD-10-CM | POA: Diagnosis not present

## 2019-04-13 LAB — HM DIABETES EYE EXAM

## 2019-04-24 ENCOUNTER — Other Ambulatory Visit: Payer: Self-pay | Admitting: Internal Medicine

## 2019-04-24 DIAGNOSIS — E1169 Type 2 diabetes mellitus with other specified complication: Secondary | ICD-10-CM

## 2019-04-24 MED ORDER — ONETOUCH ULTRA VI STRP: ORAL_STRIP | 1 refills | Status: DC

## 2019-04-24 NOTE — Addendum Note (Signed)
Addended by: Despina Hidden on: 04/24/2019 04:04 PM   Modules accepted: Orders

## 2019-04-25 ENCOUNTER — Telehealth: Payer: Self-pay | Admitting: *Deleted

## 2019-04-25 NOTE — Telephone Encounter (Signed)
Received prior authorization from CoverMyMeds for patient's One Touch Ultra Strips.  Submitted Prior Authorization to [employer] Determination in 48-72 hours.   Key: [id number]

## 2019-04-26 MED ORDER — ACCU-CHEK FASTCLIX LANCETS MISC: 1 refills | Status: DC

## 2019-04-26 MED ORDER — ACCU-CHEK AVIVA PLUS W/DEVICE KIT: PACK | 0 refills | Status: DC

## 2019-04-26 MED ORDER — ACCU-CHEK AVIVA PLUS VI STRP: ORAL_STRIP | 1 refills | Status: DC

## 2019-04-26 NOTE — Telephone Encounter (Signed)
Received fax from [facility] stating One Touch Ultra Test Strips were DENIED.   [facility] stated that Accu Chek is preferred. Rx sent for accu Chek.

## 2019-04-26 NOTE — Addendum Note (Signed)
Addended by: Rafael Bihari A on: 04/26/2019 04:27 PM   Modules accepted: Orders

## 2019-04-27 ENCOUNTER — Telehealth: Payer: Self-pay

## 2019-04-27 NOTE — Telephone Encounter (Signed)
Patients wife Ulice Dash called to ask if the request that was sent by [facility] about her husbands lancets had been resolved. I called the pharmacy at [facility] and was told everything had been approved and was ready for pickup I called patients wife back and informed her of this and that she could pick up her husbands medication an lancets at pharmacy

## 2019-05-18 ENCOUNTER — Ambulatory Visit (INDEPENDENT_AMBULATORY_CARE_PROVIDER_SITE_OTHER): Payer: Medicare HMO | Admitting: Internal Medicine

## 2019-05-18 ENCOUNTER — Encounter: Payer: Self-pay | Admitting: Internal Medicine

## 2019-05-18 ENCOUNTER — Other Ambulatory Visit: Payer: Self-pay

## 2019-05-18 VITALS — BP 160/80 | HR 76 | Temp 97.8°F | Ht 67.0 in | Wt 195.0 lb

## 2019-05-18 DIAGNOSIS — I10 Essential (primary) hypertension: Secondary | ICD-10-CM | POA: Diagnosis not present

## 2019-05-18 DIAGNOSIS — N401 Enlarged prostate with lower urinary tract symptoms: Secondary | ICD-10-CM | POA: Diagnosis not present

## 2019-05-18 DIAGNOSIS — J449 Chronic obstructive pulmonary disease, unspecified: Secondary | ICD-10-CM | POA: Diagnosis not present

## 2019-05-18 DIAGNOSIS — E1169 Type 2 diabetes mellitus with other specified complication: Secondary | ICD-10-CM | POA: Diagnosis not present

## 2019-05-18 DIAGNOSIS — R350 Frequency of micturition: Secondary | ICD-10-CM | POA: Diagnosis not present

## 2019-05-18 DIAGNOSIS — F17210 Nicotine dependence, cigarettes, uncomplicated: Secondary | ICD-10-CM | POA: Diagnosis not present

## 2019-05-18 DIAGNOSIS — Z23 Encounter for immunization: Secondary | ICD-10-CM | POA: Diagnosis not present

## 2019-05-18 DIAGNOSIS — Z794 Long term (current) use of insulin: Secondary | ICD-10-CM

## 2019-05-18 DIAGNOSIS — R0602 Shortness of breath: Secondary | ICD-10-CM

## 2019-05-18 NOTE — Patient Instructions (Signed)
Call us and tell me which inhaler you've been using that your brother has.
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
## 2019-05-19 ENCOUNTER — Telehealth: Payer: Self-pay | Admitting: *Deleted

## 2019-05-19 ENCOUNTER — Other Ambulatory Visit: Payer: Self-pay | Admitting: Internal Medicine

## 2019-05-19 DIAGNOSIS — F5104 Psychophysiologic insomnia: Secondary | ICD-10-CM

## 2019-05-19 DIAGNOSIS — J449 Chronic obstructive pulmonary disease, unspecified: Secondary | ICD-10-CM

## 2019-05-19 LAB — COMPLETE METABOLIC PANEL WITH GFR
AG Ratio: 2 (calc) (ref 1.0–2.5)
ALT: 104 U/L — ABNORMAL HIGH (ref 9–46)
AST: 56 U/L — ABNORMAL HIGH (ref 10–35)
Albumin: 4.3 g/dL (ref 3.6–5.1)
Alkaline phosphatase (APISO): 61 U/L (ref 35–144)
BUN: 13 mg/dL (ref 7–25)
CO2: 26 mmol/L (ref 20–32)
Calcium: 9.8 mg/dL (ref 8.6–10.3)
Chloride: 98 mmol/L (ref 98–110)
Creat: 0.76 mg/dL (ref 0.70–1.18)
GFR, Est African American: 107 mL/min/{1.73_m2} (ref >=60)
GFR, Est Non African American: 92 mL/min/{1.73_m2} (ref >=60)
Globulin: 2.2 g/dL (calc) (ref 1.9–3.7)
Glucose, Bld: 320 mg/dL — ABNORMAL HIGH (ref 65–139)
Potassium: 4.6 mmol/L (ref 3.5–5.3)
Sodium: 137 mmol/L (ref 135–146)
Total Bilirubin: 0.4 mg/dL (ref 0.2–1.2)
Total Protein: 6.5 g/dL (ref 6.1–8.1)

## 2019-05-19 LAB — CBC WITH DIFFERENTIAL/PLATELET
Absolute Monocytes: 748 cells/uL (ref 200–950)
Basophils Absolute: 84 cells/uL (ref 0–200)
Basophils Relative: 1 %
Eosinophils Absolute: 59 cells/uL (ref 15–500)
Eosinophils Relative: 0.7 %
HCT: 47.6 % (ref 38.5–50.0)
Hemoglobin: 16 g/dL (ref 13.2–17.1)
Lymphs Abs: 1092 cells/uL (ref 850–3900)
MCH: 30 pg (ref 27.0–33.0)
MCHC: 33.6 g/dL (ref 32.0–36.0)
MCV: 89.1 fL (ref 80.0–100.0)
MPV: 11.8 fL (ref 7.5–12.5)
Monocytes Relative: 8.9 %
Neutro Abs: 6418 cells/uL (ref 1500–7800)
Neutrophils Relative %: 76.4 %
Platelets: 214 10*3/uL (ref 140–400)
RBC: 5.34 10*6/uL (ref 4.20–5.80)
RDW: 13.3 % (ref 11.0–15.0)
Total Lymphocyte: 13 %
WBC: 8.4 10*3/uL (ref 3.8–10.8)

## 2019-05-19 LAB — HEMOGLOBIN A1C
Hgb A1c MFr Bld: 10.7 % of total Hgb — ABNORMAL HIGH (ref <5.7)
Mean Plasma Glucose: 260 (calc)
eAG (mmol/L): 14.4 (calc)

## 2019-05-19 MED ORDER — STIOLTO RESPIMAT 2.5-2.5 MCG/ACT IN AERS: 2.0000 | INHALATION_SPRAY | Freq: Every day | RESPIRATORY_TRACT | 5 refills | Status: AC

## 2019-05-19 MED ORDER — LORAZEPAM 1 MG PO TABS: ORAL_TABLET | ORAL | 0 refills | Status: DC

## 2019-05-19 NOTE — Telephone Encounter (Signed)
He should stop symbicort (which he really was not using). I have sent stiolto which is on his formulary because when I tried to send the combivent that his brother uses, it is not on formulary so cost would definitely be excessive. Signed lorazepam also.

## 2019-05-19 NOTE — Telephone Encounter (Signed)
.   COPD pfts pending  - Low dose CT scan of lungs discussed with patient due to long smoking history- he is refusing to have diagnostic test at this time - Do not know type of inhaler he uses from his brother, we have asked him to let the office know - Encourage Symbicort inhaler usage daily - Recommended patient having a pulmonary function test and referral to pulmonary specialist- he refuses at this time      Patient called back and stated that the inhaler he has been using is Combivent. Would like a Rx sent to pharmacy for the inhaler and for his Lorazepam. Pended Lorazepam Rx for approval.  Please Advise.

## 2019-05-19 NOTE — Telephone Encounter (Signed)
Patient wife notified and agreed.  

## 2019-05-29 ENCOUNTER — Other Ambulatory Visit: Payer: Self-pay | Admitting: Internal Medicine

## 2019-06-15 ENCOUNTER — Other Ambulatory Visit: Payer: Self-pay | Admitting: Internal Medicine

## 2019-06-18 ENCOUNTER — Other Ambulatory Visit: Payer: Self-pay | Admitting: Internal Medicine

## 2019-06-18 DIAGNOSIS — F5104 Psychophysiologic insomnia: Secondary | ICD-10-CM

## 2019-06-19 NOTE — Telephone Encounter (Signed)
Last filled in Epic on 05/19/2019

## 2019-07-28 ENCOUNTER — Other Ambulatory Visit: Payer: Self-pay | Admitting: Internal Medicine

## 2019-07-28 DIAGNOSIS — F5104 Psychophysiologic insomnia: Secondary | ICD-10-CM

## 2019-08-01 ENCOUNTER — Other Ambulatory Visit: Payer: Self-pay | Admitting: *Deleted

## 2019-08-01 MED ORDER — LOSARTAN POTASSIUM 50 MG PO TABS: 50.0000 mg | ORAL_TABLET | Freq: Every day | ORAL | 1 refills | Status: DC

## 2019-08-01 NOTE — Telephone Encounter (Signed)
[facility] 

## 2019-08-24 ENCOUNTER — Other Ambulatory Visit: Payer: Self-pay | Admitting: Internal Medicine

## 2019-08-25 ENCOUNTER — Other Ambulatory Visit: Payer: Self-pay | Admitting: Internal Medicine

## 2019-08-25 DIAGNOSIS — F5104 Psychophysiologic insomnia: Secondary | ICD-10-CM

## 2019-08-28 NOTE — Telephone Encounter (Signed)
Last filled 07/28/2019

## 2019-09-18 ENCOUNTER — Other Ambulatory Visit: Payer: Medicare HMO

## 2019-09-18 ENCOUNTER — Other Ambulatory Visit: Payer: Self-pay

## 2019-09-18 DIAGNOSIS — Z794 Long term (current) use of insulin: Secondary | ICD-10-CM

## 2019-09-18 DIAGNOSIS — E1169 Type 2 diabetes mellitus with other specified complication: Secondary | ICD-10-CM | POA: Diagnosis not present

## 2019-09-18 LAB — LIPID PANEL
Cholesterol: 264 mg/dL — ABNORMAL HIGH (ref <200)
HDL: 39 mg/dL — ABNORMAL LOW (ref >=40)
Non-HDL Cholesterol (Calc): 225 mg/dL (calc) — ABNORMAL HIGH (ref <130)
Total CHOL/HDL Ratio: 6.8 (calc) — ABNORMAL HIGH (ref <5.0)
Triglycerides: 738 mg/dL — ABNORMAL HIGH (ref <150)

## 2019-09-19 NOTE — Progress Notes (Signed)
Triglycerides are now up to 738!  This is dangerously high putting him at risk for pancreatitis.  We have to do something about this at his next visit!

## 2019-09-21 ENCOUNTER — Ambulatory Visit (INDEPENDENT_AMBULATORY_CARE_PROVIDER_SITE_OTHER): Payer: Medicare HMO | Admitting: Internal Medicine

## 2019-09-21 ENCOUNTER — Other Ambulatory Visit: Payer: Self-pay

## 2019-09-21 ENCOUNTER — Encounter: Payer: Self-pay | Admitting: Internal Medicine

## 2019-09-21 VITALS — BP 158/78 | HR 85 | Temp 97.7°F | Ht 67.0 in | Wt 196.0 lb

## 2019-09-21 DIAGNOSIS — I10 Essential (primary) hypertension: Secondary | ICD-10-CM

## 2019-09-21 DIAGNOSIS — R7401 Elevation of levels of liver transaminase levels: Secondary | ICD-10-CM

## 2019-09-21 DIAGNOSIS — E1165 Type 2 diabetes mellitus with hyperglycemia: Secondary | ICD-10-CM | POA: Diagnosis not present

## 2019-09-21 DIAGNOSIS — E782 Mixed hyperlipidemia: Secondary | ICD-10-CM

## 2019-09-21 DIAGNOSIS — J449 Chronic obstructive pulmonary disease, unspecified: Secondary | ICD-10-CM

## 2019-09-21 DIAGNOSIS — Z794 Long term (current) use of insulin: Secondary | ICD-10-CM | POA: Diagnosis not present

## 2019-09-21 MED ORDER — FENOFIBRATE 160 MG PO TABS: 160.0000 mg | ORAL_TABLET | Freq: Every day | ORAL | 3 refills | Status: DC

## 2019-09-21 NOTE — Patient Instructions (Addendum)
I will increase your fenofibrate for your triglycerides; however, you need to improve your diet--decrease your starchy carbs like pasta, rice, bread, sweets, potatoes and fried foods.  When you qualify for the covid vaccine, you can call the number below: COVID-19 Vaccine Information can be found at: ShippingScam.co.uk For questions related to vaccine distribution or appointments, please email vaccine@[place] .com or call 3403025406.
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
## 2019-09-26 ENCOUNTER — Other Ambulatory Visit: Payer: Self-pay | Admitting: Internal Medicine

## 2019-09-26 NOTE — Telephone Encounter (Signed)
rx sent to pharmacy by e-script  

## 2019-09-30 ENCOUNTER — Other Ambulatory Visit: Payer: Self-pay | Admitting: Internal Medicine

## 2019-09-30 DIAGNOSIS — F5104 Psychophysiologic insomnia: Secondary | ICD-10-CM

## 2019-10-16 ENCOUNTER — Other Ambulatory Visit: Payer: Self-pay | Admitting: *Deleted

## 2019-10-16 MED ORDER — ACCU-CHEK AVIVA PLUS VI STRP: ORAL_STRIP | 1 refills | Status: DC

## 2019-10-16 NOTE — Telephone Encounter (Signed)
[facility] 

## 2019-10-22 ENCOUNTER — Other Ambulatory Visit: Payer: Self-pay | Admitting: Internal Medicine

## 2019-10-23 NOTE — Telephone Encounter (Signed)
rx sent to pharmacy by e-script  

## 2019-10-30 ENCOUNTER — Other Ambulatory Visit: Payer: Self-pay | Admitting: Internal Medicine

## 2019-11-01 ENCOUNTER — Other Ambulatory Visit: Payer: Self-pay | Admitting: Internal Medicine

## 2019-11-01 DIAGNOSIS — F5104 Psychophysiologic insomnia: Secondary | ICD-10-CM

## 2019-11-01 DIAGNOSIS — N401 Enlarged prostate with lower urinary tract symptoms: Secondary | ICD-10-CM

## 2019-11-26 ENCOUNTER — Other Ambulatory Visit: Payer: Self-pay | Admitting: Internal Medicine

## 2019-11-27 NOTE — Telephone Encounter (Signed)
rx sent to pharmacy by e-script  

## 2019-11-29 ENCOUNTER — Other Ambulatory Visit: Payer: Self-pay | Admitting: Internal Medicine

## 2019-12-01 ENCOUNTER — Other Ambulatory Visit: Payer: Self-pay | Admitting: Internal Medicine

## 2019-12-01 DIAGNOSIS — F5104 Psychophysiologic insomnia: Secondary | ICD-10-CM

## 2019-12-21 ENCOUNTER — Encounter: Payer: Self-pay | Admitting: Family

## 2019-12-25 ENCOUNTER — Other Ambulatory Visit: Payer: Self-pay | Admitting: *Deleted

## 2019-12-25 MED ORDER — METFORMIN HCL 1000 MG PO TABS: 1000.0000 mg | ORAL_TABLET | Freq: Two times a day (BID) | ORAL | 0 refills | Status: DC

## 2019-12-25 NOTE — Telephone Encounter (Signed)
[facility] 

## 2019-12-29 ENCOUNTER — Other Ambulatory Visit: Payer: Self-pay | Admitting: Internal Medicine

## 2020-01-03 ENCOUNTER — Other Ambulatory Visit: Payer: Self-pay | Admitting: Nurse Practitioner

## 2020-01-03 DIAGNOSIS — F5104 Psychophysiologic insomnia: Secondary | ICD-10-CM

## 2020-01-03 NOTE — Telephone Encounter (Signed)
Received EScribe Request from pharmacy. Pended and sent to Dr. Mariea Clonts for approval.

## 2020-01-29 ENCOUNTER — Other Ambulatory Visit: Payer: Self-pay | Admitting: Nurse Practitioner

## 2020-02-02 ENCOUNTER — Other Ambulatory Visit: Payer: Self-pay | Admitting: Internal Medicine

## 2020-02-02 DIAGNOSIS — F5104 Psychophysiologic insomnia: Secondary | ICD-10-CM

## 2020-02-02 NOTE — Telephone Encounter (Signed)
Sent to Dr. [name] for approval  

## 2020-03-04 ENCOUNTER — Encounter: Payer: Medicare HMO | Admitting: Family

## 2020-03-05 ENCOUNTER — Ambulatory Visit (INDEPENDENT_AMBULATORY_CARE_PROVIDER_SITE_OTHER): Payer: Medicare HMO | Admitting: Family

## 2020-03-05 ENCOUNTER — Encounter: Payer: Self-pay | Admitting: Family

## 2020-03-05 ENCOUNTER — Telehealth: Payer: Self-pay

## 2020-03-05 ENCOUNTER — Other Ambulatory Visit: Payer: Self-pay

## 2020-03-05 DIAGNOSIS — Z0000 Encounter for general adult medical examination without abnormal findings: Secondary | ICD-10-CM
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
## 2020-03-05 NOTE — Telephone Encounter (Signed)
Matthew Dominguez was doing a AWV with the patient. He became upset and hung up on her.  I called him back and stated that we must have gotten disconnected.  He stated we ask the "same damn questions every year" and he isn't answering anymore questions. I told him that the AWV was done yearly and that we do ask the same questions. He stated he didn't care, that it was  "bullshit." I terminated the call at this point.

## 2020-03-05 NOTE — Patient Instructions (Signed)
Matthew Dominguez ,  Please review the following were not able to complete since you hanged up the phone and did not want to complete the rest of the Medicare Wellness Visit.let me know if I can assist you in the future.   Screening recommendations/referrals: Colonoscopy  Recommended yearly ophthalmology/optometry visit for glaucoma screening and checkup Recommended yearly dental visit for hygiene and checkup  Vaccinations: Influenza vaccine : Annually  Pneumococcal vaccine: completed on chart  Tdap vaccine : over due discuss with Dr.[name]  Shingles vaccine : Due for second  Dose Shingrix     Advanced directives: Yes   Conditions/risks identified: Advance age male > 76 yrs,male gender,Hypertension,Type 2 Diabetes,Sedentary lifestyle,Hx of smoking,Obesity BMI > 30   Next appointment: 1 year   Preventive Care 4 Years and Older, Male Preventive care refers to lifestyle choices and visits with your health care provider that can promote health and wellness. What does preventive care include?  A yearly physical exam. This is also called an annual well check.  Dental exams once or twice a year.  Routine eye exams. Ask your health care provider how often you should have your eyes checked.  Personal lifestyle choices, including:  Daily care of your teeth and gums.  Regular physical activity.  Eating a healthy diet.  Avoiding tobacco and drug use.  Limiting alcohol use.  Practicing safe sex.  Taking low doses of aspirin every day.  Taking vitamin and mineral supplements as recommended by your health care provider. What happens during an annual well check? The services and screenings done by your health care provider during your annual well check will depend on your age, overall health, lifestyle risk factors, and family history of disease. Counseling  Your health care provider may ask you questions about your:  Alcohol use.  Tobacco use.  Drug use.  Emotional  well-being.  Home and relationship well-being.  Sexual activity.  Eating habits.  History of falls.  Memory and ability to understand (cognition).  Work and work Statistician. Screening  You may have the following tests or measurements:  Height, weight, and BMI.  Blood pressure.  Lipid and cholesterol levels. These may be checked every 5 years, or more frequently if you are over 75 years old.  Skin check.  Lung cancer screening. You may have this screening every year starting at age 21 if you have a 30-pack-year history of smoking and currently smoke or have quit within the past 15 years.  Fecal occult blood test (FOBT) of the stool. You may have this test every year starting at age 15.  Flexible sigmoidoscopy or colonoscopy. You may have a sigmoidoscopy every 5 years or a colonoscopy every 10 years starting at age 68.  Prostate cancer screening. Recommendations will vary depending on your family history and other risks.  Hepatitis C blood test.  Hepatitis B blood test.  Sexually transmitted disease (STD) testing.  Diabetes screening. This is done by checking your blood sugar (glucose) after you have not eaten for a while (fasting). You may have this done every 1-3 years.  Abdominal aortic aneurysm (AAA) screening. You may need this if you are a current or former smoker.  Osteoporosis. You may be screened starting at age 29 if you are at high risk. Talk with your health care provider about your test results, treatment options, and if necessary, the need for more tests. Vaccines  Your health care provider may recommend certain vaccines, such as:  Influenza vaccine. This is recommended every year.  Tetanus, diphtheria, and acellular pertussis (Tdap, Td) vaccine. You may need a Td booster every 10 years.  Zoster vaccine. You may need this after age 52.  Pneumococcal 13-valent conjugate (PCV13) vaccine. One dose is recommended after age 26.  Pneumococcal  polysaccharide (PPSV23) vaccine. One dose is recommended after age 84. Talk to your health care provider about which screenings and vaccines you need and how often you need them. This information is not intended to replace advice given to you by your health care provider. Make sure you discuss any questions you have with your health care provider. Document Released: 09/20/2015 Document Revised: 05/13/2016 Document Reviewed: 06/25/2015 Elsevier Interactive Patient Education  2017 [place] Prevention in the Home Falls can cause injuries. They can happen to people of all ages. There are many things you can do to make your home safe and to help prevent falls. What can I do on the outside of my home?  Regularly fix the edges of walkways and driveways and fix any cracks.  Remove anything that might make you trip as you walk through a door, such as a raised step or threshold.  Trim any bushes or trees on the path to your home.  Use bright outdoor lighting.  Clear any walking paths of anything that might make someone trip, such as rocks or tools.  Regularly check to see if handrails are loose or broken. Make sure that both sides of any steps have handrails.  Any raised decks and porches should have guardrails on the edges.  Have any leaves, snow, or ice cleared regularly.  Use sand or salt on walking paths during winter.  Clean up any spills in your garage right away. This includes oil or grease spills. What can I do in the bathroom?  Use night lights.  Install grab bars by the toilet and in the tub and shower. Do not use towel bars as grab bars.  Use non-skid mats or decals in the tub or shower.  If you need to sit down in the shower, use a plastic, non-slip stool.  Keep the floor dry. Clean up any water that spills on the floor as soon as it happens.  Remove soap buildup in the tub or shower regularly.  Attach bath mats securely with double-sided non-slip rug  tape.  Do not have throw rugs and other things on the floor that can make you trip. What can I do in the bedroom?  Use night lights.  Make sure that you have a light by your bed that is easy to reach.  Do not use any sheets or blankets that are too big for your bed. They should not hang down onto the floor.  Have a firm chair that has side arms. You can use this for support while you get dressed.  Do not have throw rugs and other things on the floor that can make you trip. What can I do in the kitchen?  Clean up any spills right away.  Avoid walking on wet floors.  Keep items that you use a lot in easy-to-reach places.  If you need to reach something above you, use a strong step stool that has a grab bar.  Keep electrical cords out of the way.  Do not use floor polish or wax that makes floors slippery. If you must use wax, use non-skid floor wax.  Do not have throw rugs and other things on the floor that can make you trip. What can I do  with my stairs?  Do not leave any items on the stairs.  Make sure that there are handrails on both sides of the stairs and use them. Fix handrails that are broken or loose. Make sure that handrails are as long as the stairways.  Check any carpeting to make sure that it is firmly attached to the stairs. Fix any carpet that is loose or worn.  Avoid having throw rugs at the top or bottom of the stairs. If you do have throw rugs, attach them to the floor with carpet tape.  Make sure that you have a light switch at the top of the stairs and the bottom of the stairs. If you do not have them, ask someone to add them for you. What else can I do to help prevent falls?  Wear shoes that:  Do not have high heels.  Have rubber bottoms.  Are comfortable and fit you well.  Are closed at the toe. Do not wear sandals.  If you use a stepladder:  Make sure that it is fully opened. Do not climb a closed stepladder.  Make sure that both sides of the  stepladder are locked into place.  Ask someone to hold it for you, if possible.  Clearly mark and make sure that you can see:  Any grab bars or handrails.  First and last steps.  Where the edge of each step is.  Use tools that help you move around (mobility aids) if they are needed. These include:  Canes.  Walkers.  Scooters.  Crutches.  Turn on the lights when you go into a dark area. Replace any light bulbs as soon as they burn out.  Set up your furniture so you have a clear path. Avoid moving your furniture around.  If any of your floors are uneven, fix them.  If there are any pets around you, be aware of where they are.  Review your medicines with your doctor. Some medicines can make you feel dizzy. This can increase your chance of falling. Ask your doctor what other things that you can do to help prevent falls. This information is not intended to replace advice given to you by your health care provider. Make sure you discuss any questions you have with your health care provider. Document Released: 06/20/2009 Document Revised: 01/30/2016 Document Reviewed: 09/28/2014 Elsevier Interactive Patient Education  2017 [employer].

## 2020-03-05 NOTE — Telephone Encounter (Signed)
[name], buccheri are scheduled for a virtual visit with your provider today.    Just as we do with appointments in the office, we must obtain your consent to participate.  Your consent will be active for this visit and any virtual visit you may have with one of our providers in the next 365 days.    If you have a MyChart account, I can also send a copy of this consent to you electronically.  All virtual visits are billed to your insurance company just like a traditional visit in the office.  As this is a virtual visit, video technology does not allow for your provider to perform a traditional examination.  This may limit your provider's ability to fully assess your condition.  If your provider identifies any concerns that need to be evaluated in person or the need to arrange testing such as labs, EKG, etc, we will make arrangements to do so.    Although advances in technology are sophisticated, we cannot ensure that it will always work on either your end or our end.  If the connection with a video visit is poor, we may have to switch to a telephone visit.  With either a video or telephone visit, we are not always able to ensure that we have a secure connection.   I need to obtain your verbal consent now.   Are you willing to proceed with your visit today?   Matthew Dominguez has provided verbal consent on 03/05/2020 for a virtual visit (video or telephone).   [place], [place] 03/05/2020  11:34 AM

## 2020-03-10 ENCOUNTER — Other Ambulatory Visit: Payer: Self-pay | Admitting: Internal Medicine

## 2020-03-10 DIAGNOSIS — F5104 Psychophysiologic insomnia: Secondary | ICD-10-CM

## 2020-03-12 NOTE — Telephone Encounter (Signed)
Refill request sent to Dr.[name] for approval.

## 2020-03-27 ENCOUNTER — Other Ambulatory Visit: Payer: Self-pay | Admitting: Family

## 2020-03-28 NOTE — Telephone Encounter (Signed)
Patient is overdue for a routine follow-up with Dr.[name] with labs 2-3 days prior.  Left message on voicemail for patient to return call when available

## 2020-04-01 ENCOUNTER — Other Ambulatory Visit: Payer: Self-pay | Admitting: *Deleted

## 2020-04-01 ENCOUNTER — Other Ambulatory Visit: Payer: Self-pay | Admitting: Internal Medicine

## 2020-04-01 MED ORDER — INSULIN REGULAR HUMAN 100 UNIT/ML IJ SOLN: INTRAMUSCULAR | 0 refills | Status: DC

## 2020-04-01 NOTE — Telephone Encounter (Signed)
Received fax from pharmacy stating that they received the refill approval but needs Diagnosis for insurance to cover.

## 2020-04-12 ENCOUNTER — Other Ambulatory Visit: Payer: Self-pay

## 2020-04-12 DIAGNOSIS — E1165 Type 2 diabetes mellitus with hyperglycemia: Secondary | ICD-10-CM

## 2020-04-12 DIAGNOSIS — I10 Essential (primary) hypertension: Secondary | ICD-10-CM

## 2020-04-12 DIAGNOSIS — R7401 Elevation of levels of liver transaminase levels: Secondary | ICD-10-CM

## 2020-04-12 DIAGNOSIS — E782 Mixed hyperlipidemia: Secondary | ICD-10-CM

## 2020-04-14 ENCOUNTER — Other Ambulatory Visit: Payer: Self-pay | Admitting: Internal Medicine

## 2020-04-14 DIAGNOSIS — F5104 Psychophysiologic insomnia: Secondary | ICD-10-CM

## 2020-04-15 ENCOUNTER — Other Ambulatory Visit: Payer: Self-pay

## 2020-04-15 DIAGNOSIS — E1165 Type 2 diabetes mellitus with hyperglycemia: Secondary | ICD-10-CM | POA: Diagnosis not present

## 2020-04-15 DIAGNOSIS — R7401 Elevation of levels of liver transaminase levels: Secondary | ICD-10-CM | POA: Diagnosis not present

## 2020-04-15 DIAGNOSIS — E782 Mixed hyperlipidemia: Secondary | ICD-10-CM | POA: Diagnosis not present

## 2020-04-15 DIAGNOSIS — I10 Essential (primary) hypertension: Secondary | ICD-10-CM | POA: Diagnosis not present

## 2020-04-15 NOTE — Telephone Encounter (Signed)
Pharmacy requested refill Epic LR: 03/12/2020 Needs Contract signed, added to future appointment notes.  Pended Rx and sent to Dr. Mariea Clonts for approval.

## 2020-04-16 LAB — COMPLETE METABOLIC PANEL WITH GFR
AG Ratio: 2 (calc) (ref 1.0–2.5)
ALT: 133 U/L — ABNORMAL HIGH (ref 9–46)
AST: 88 U/L — ABNORMAL HIGH (ref 10–35)
Albumin: 4.3 g/dL (ref 3.6–5.1)
Alkaline phosphatase (APISO): 61 U/L (ref 35–144)
BUN: 11 mg/dL (ref 7–25)
CO2: 27 mmol/L (ref 20–32)
Calcium: 9.7 mg/dL (ref 8.6–10.3)
Chloride: 96 mmol/L — ABNORMAL LOW (ref 98–110)
Creat: 0.74 mg/dL (ref 0.70–1.18)
GFR, Est African American: 108 mL/min/{1.73_m2} (ref >=60)
GFR, Est Non African American: 93 mL/min/{1.73_m2} (ref >=60)
Globulin: 2.2 g/dL (calc) (ref 1.9–3.7)
Glucose, Bld: 355 mg/dL — ABNORMAL HIGH (ref 65–99)
Potassium: 4.1 mmol/L (ref 3.5–5.3)
Sodium: 135 mmol/L (ref 135–146)
Total Bilirubin: 1 mg/dL (ref 0.2–1.2)
Total Protein: 6.5 g/dL (ref 6.1–8.1)

## 2020-04-16 LAB — CBC WITH DIFFERENTIAL/PLATELET
Absolute Monocytes: 502 cells/uL (ref 200–950)
Basophils Absolute: 89 cells/uL (ref 0–200)
Basophils Relative: 1.5 %
Eosinophils Absolute: 100 cells/uL (ref 15–500)
Eosinophils Relative: 1.7 %
HCT: 49.6 % (ref 38.5–50.0)
Hemoglobin: 16.6 g/dL (ref 13.2–17.1)
Lymphs Abs: 1168 cells/uL (ref 850–3900)
MCH: 31 pg (ref 27.0–33.0)
MCHC: 33.5 g/dL (ref 32.0–36.0)
MCV: 92.7 fL (ref 80.0–100.0)
MPV: 11.5 fL (ref 7.5–12.5)
Monocytes Relative: 8.5 %
Neutro Abs: 4042 cells/uL (ref 1500–7800)
Neutrophils Relative %: 68.5 %
Platelets: 199 10*3/uL (ref 140–400)
RBC: 5.35 10*6/uL (ref 4.20–5.80)
RDW: 12.7 % (ref 11.0–15.0)
Total Lymphocyte: 19.8 %
WBC: 5.9 10*3/uL (ref 3.8–10.8)

## 2020-04-16 LAB — HEPATIC FUNCTION PANEL
AG Ratio: 2 (calc) (ref 1.0–2.5)
ALT: 133 U/L — ABNORMAL HIGH (ref 9–46)
AST: 88 U/L — ABNORMAL HIGH (ref 10–35)
Albumin: 4.3 g/dL (ref 3.6–5.1)
Alkaline phosphatase (APISO): 61 U/L (ref 35–144)
Bilirubin, Direct: 0.3 mg/dL — ABNORMAL HIGH (ref 0.0–0.2)
Globulin: 2.2 g/dL (calc) (ref 1.9–3.7)
Indirect Bilirubin: 0.7 mg/dL (calc) (ref 0.2–1.2)
Total Bilirubin: 1 mg/dL (ref 0.2–1.2)
Total Protein: 6.5 g/dL (ref 6.1–8.1)

## 2020-04-16 LAB — LIPID PANEL
Cholesterol: 230 mg/dL — ABNORMAL HIGH (ref <200)
HDL: 46 mg/dL (ref >=40)
LDL Cholesterol (Calc): 141 mg/dL (calc) — ABNORMAL HIGH
Non-HDL Cholesterol (Calc): 184 mg/dL (calc) — ABNORMAL HIGH (ref <130)
Total CHOL/HDL Ratio: 5 (calc) — ABNORMAL HIGH (ref <5.0)
Triglycerides: 300 mg/dL — ABNORMAL HIGH (ref <150)

## 2020-04-16 LAB — HEMOGLOBIN A1C
Hgb A1c MFr Bld: 13.7 % of total Hgb — ABNORMAL HIGH (ref <5.7)
Mean Plasma Glucose: 346 (calc)
eAG (mmol/L): 19.2 (calc)

## 2020-04-16 NOTE — Progress Notes (Signed)
Please contact his wife and let her know that I'm concerned--Matthew Dominguez's sugar average (hba1c) is up to 13.7--please find out if he's been out of lantus, novolin and/or metformin or has not taken them for some reason.  I know cost is often a concern, but this sugar is dangerously high.  Sugar that morning was 355. His liver tests have not changed much (if he was trying to go without metformin to see if they got better because he's asked to stop it a few times before). Triglycerides are better than last time, but overall cholesterol panel is way too high.

## 2020-04-18 ENCOUNTER — Encounter: Payer: Self-pay | Admitting: Internal Medicine

## 2020-04-18 ENCOUNTER — Ambulatory Visit: Payer: Self-pay | Admitting: Internal Medicine

## 2020-04-18 ENCOUNTER — Other Ambulatory Visit: Payer: Self-pay

## 2020-04-18 ENCOUNTER — Ambulatory Visit (INDEPENDENT_AMBULATORY_CARE_PROVIDER_SITE_OTHER): Payer: Medicare HMO | Admitting: Internal Medicine

## 2020-04-18 VITALS — BP 122/72 | HR 113 | Temp 96.9°F | Ht 67.0 in | Wt 188.2 lb

## 2020-04-18 DIAGNOSIS — F5104 Psychophysiologic insomnia: Secondary | ICD-10-CM

## 2020-04-18 DIAGNOSIS — R7401 Elevation of levels of liver transaminase levels: Secondary | ICD-10-CM | POA: Diagnosis not present

## 2020-04-18 DIAGNOSIS — E1165 Type 2 diabetes mellitus with hyperglycemia: Secondary | ICD-10-CM

## 2020-04-18 DIAGNOSIS — N1831 Chronic kidney disease, stage 3a: Secondary | ICD-10-CM | POA: Diagnosis not present

## 2020-04-18 DIAGNOSIS — I4811 Longstanding persistent atrial fibrillation: Secondary | ICD-10-CM | POA: Diagnosis not present

## 2020-04-18 DIAGNOSIS — J449 Chronic obstructive pulmonary disease, unspecified: Secondary | ICD-10-CM

## 2020-04-18 DIAGNOSIS — E782 Mixed hyperlipidemia: Secondary | ICD-10-CM

## 2020-04-18 MED ORDER — ZOLPIDEM TARTRATE 5 MG PO TABS: 5.0000 mg | ORAL_TABLET | Freq: Every evening | ORAL | 0 refills | Status: DC | PRN
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
## 2020-04-18 NOTE — Patient Instructions (Addendum)
Please bring yours and your wife's covid vaccine cards by.    Check your sugars before meals and take the novolin 12 units after you eat the meal.  Continue your lantus at night.    You may get your Td at the pharmacy (tetanus booster).  I recommend you get an eye exam once a year and ask your eye doctor to send me a report.  A colonoscopy is also recommended to make sure you don't have colon cancer.

## 2020-05-07 ENCOUNTER — Other Ambulatory Visit: Payer: Self-pay | Admitting: Internal Medicine

## 2020-05-07 DIAGNOSIS — J432 Centrilobular emphysema: Secondary | ICD-10-CM

## 2020-05-14 ENCOUNTER — Other Ambulatory Visit: Payer: Self-pay | Admitting: Internal Medicine

## 2020-05-14 DIAGNOSIS — F5104 Psychophysiologic insomnia: Secondary | ICD-10-CM

## 2020-05-14 NOTE — Telephone Encounter (Signed)
Pharmacy requested refill Contract on file. Pended Rx's and sent to Dr. Mariea Clonts for approval.

## 2020-05-18 ENCOUNTER — Other Ambulatory Visit: Payer: Self-pay | Admitting: Internal Medicine

## 2020-05-19 ENCOUNTER — Other Ambulatory Visit: Payer: Self-pay | Admitting: Internal Medicine

## 2020-06-05 ENCOUNTER — Other Ambulatory Visit: Payer: Self-pay | Admitting: Internal Medicine

## 2020-06-06 NOTE — Telephone Encounter (Signed)
rx sent to pharmacy by e-script  

## 2020-06-07 ENCOUNTER — Other Ambulatory Visit: Payer: Self-pay | Admitting: Internal Medicine

## 2020-06-07 DIAGNOSIS — J432 Centrilobular emphysema: Secondary | ICD-10-CM

## 2020-06-07 NOTE — Telephone Encounter (Signed)
rx sent to pharmacy by e-script  

## 2020-06-16 ENCOUNTER — Other Ambulatory Visit: Payer: Self-pay | Admitting: Internal Medicine

## 2020-06-16 DIAGNOSIS — F5104 Psychophysiologic insomnia: Secondary | ICD-10-CM

## 2020-06-27 ENCOUNTER — Other Ambulatory Visit: Payer: Self-pay | Admitting: Internal Medicine

## 2020-07-16 ENCOUNTER — Other Ambulatory Visit: Payer: Self-pay | Admitting: Internal Medicine

## 2020-07-16 DIAGNOSIS — J432 Centrilobular emphysema: Secondary | ICD-10-CM

## 2020-08-05 ENCOUNTER — Other Ambulatory Visit: Payer: Self-pay | Admitting: Internal Medicine

## 2020-08-11 ENCOUNTER — Other Ambulatory Visit: Payer: Self-pay | Admitting: Internal Medicine

## 2020-08-11 DIAGNOSIS — J432 Centrilobular emphysema: Secondary | ICD-10-CM

## 2020-08-19 ENCOUNTER — Other Ambulatory Visit: Payer: Self-pay

## 2020-08-19 ENCOUNTER — Other Ambulatory Visit: Payer: Medicare HMO

## 2020-08-19 DIAGNOSIS — R7401 Elevation of levels of liver transaminase levels: Secondary | ICD-10-CM

## 2020-08-19 DIAGNOSIS — E782 Mixed hyperlipidemia: Secondary | ICD-10-CM | POA: Diagnosis not present

## 2020-08-19 DIAGNOSIS — E1165 Type 2 diabetes mellitus with hyperglycemia: Secondary | ICD-10-CM

## 2020-08-20 ENCOUNTER — Telehealth: Payer: Self-pay

## 2020-08-20 LAB — BASIC METABOLIC PANEL
BUN: 20 mg/dL (ref 7–25)
CO2: 27 mmol/L (ref 20–32)
Calcium: 9.9 mg/dL (ref 8.6–10.3)
Chloride: 95 mmol/L — ABNORMAL LOW (ref 98–110)
Creat: 0.92 mg/dL (ref 0.70–1.18)
Glucose, Bld: 410 mg/dL — ABNORMAL HIGH (ref 65–99)
Potassium: 4.2 mmol/L (ref 3.5–5.3)
Sodium: 134 mmol/L — ABNORMAL LOW (ref 135–146)

## 2020-08-20 LAB — HEPATIC FUNCTION PANEL
AG Ratio: 1.7 (calc) (ref 1.0–2.5)
ALT: 135 U/L — ABNORMAL HIGH (ref 9–46)
AST: 66 U/L — ABNORMAL HIGH (ref 10–35)
Albumin: 4.2 g/dL (ref 3.6–5.1)
Alkaline phosphatase (APISO): 70 U/L (ref 35–144)
Bilirubin, Direct: 0.1 mg/dL (ref 0.0–0.2)
Globulin: 2.5 g/dL (calc) (ref 1.9–3.7)
Indirect Bilirubin: 0.6 mg/dL (calc) (ref 0.2–1.2)
Total Bilirubin: 0.7 mg/dL (ref 0.2–1.2)
Total Protein: 6.7 g/dL (ref 6.1–8.1)

## 2020-08-20 LAB — LIPID PANEL
Cholesterol: 253 mg/dL — ABNORMAL HIGH (ref <200)
HDL: 43 mg/dL (ref >=40)
Non-HDL Cholesterol (Calc): 210 mg/dL (calc) — ABNORMAL HIGH (ref <130)
Total CHOL/HDL Ratio: 5.9 (calc) — ABNORMAL HIGH (ref <5.0)
Triglycerides: 485 mg/dL — ABNORMAL HIGH (ref <150)

## 2020-08-20 LAB — HEMOGLOBIN A1C
Hgb A1c MFr Bld: 12.8 % of total Hgb — ABNORMAL HIGH (ref <5.7)
Mean Plasma Glucose: 321 mg/dL
eAG (mmol/L): 17.8 mmol/L

## 2020-08-20 NOTE — Telephone Encounter (Signed)
Thanks.  Odd b/c they'd already sent the results and I'd responded to them before this.

## 2020-08-20 NOTE — Progress Notes (Signed)
Ok.  We'll review at his appt, but I was hoping his wife could be notified b/c maybe it will make a difference for the future.

## 2020-08-20 NOTE — Progress Notes (Signed)
Liver tests remain abnormal but not different from before.   Felt to be due to fatty liver disease when evaluated before.

## 2020-08-20 NOTE — Telephone Encounter (Signed)
[facility] called (1:48) with a glucose of 410, verified by repeat analysis. He has an appointment with you on Thursday.   I called him and asked if he was taking his insulin as prescribed. He stated that he was taking his insulin but that he was eating a lot of sweets.  He stated he felt well.

## 2020-08-20 NOTE — Progress Notes (Signed)
Please notify Matthew Dominguez and his wife:  Sugar average and triglycerides are sky high.  Bad cholesterol could not even be calculated.  Has he been out of insulin or medication for a while or not taking them?  Glucose was 410 on his fasting labs!

## 2020-08-22 ENCOUNTER — Ambulatory Visit (INDEPENDENT_AMBULATORY_CARE_PROVIDER_SITE_OTHER): Payer: Medicare HMO | Admitting: Internal Medicine

## 2020-08-22 ENCOUNTER — Encounter: Payer: Self-pay | Admitting: Internal Medicine

## 2020-08-22 ENCOUNTER — Other Ambulatory Visit: Payer: Self-pay

## 2020-08-22 VITALS — BP 138/82 | HR 93 | Temp 97.8°F | Ht 67.0 in | Wt 188.6 lb

## 2020-08-22 DIAGNOSIS — J449 Chronic obstructive pulmonary disease, unspecified: Secondary | ICD-10-CM

## 2020-08-22 DIAGNOSIS — L602 Onychogryphosis: Secondary | ICD-10-CM

## 2020-08-22 DIAGNOSIS — R238 Other skin changes: Secondary | ICD-10-CM | POA: Diagnosis not present

## 2020-08-22 DIAGNOSIS — F5104 Psychophysiologic insomnia: Secondary | ICD-10-CM

## 2020-08-22 DIAGNOSIS — E1165 Type 2 diabetes mellitus with hyperglycemia: Secondary | ICD-10-CM

## 2020-08-22 DIAGNOSIS — Z23 Encounter for immunization: Secondary | ICD-10-CM

## 2020-08-22 DIAGNOSIS — R7401 Elevation of levels of liver transaminase levels: Secondary | ICD-10-CM

## 2020-08-22 DIAGNOSIS — E782 Mixed hyperlipidemia: Secondary | ICD-10-CM

## 2020-08-22 MED ORDER — NOVOLIN R FLEXPEN 100 UNIT/ML IJ SOPN: 18.0000 [IU] | PEN_INJECTOR | Freq: Two times a day (BID) | INTRAMUSCULAR | 3 refills | Status: DC

## 2020-08-22 NOTE — Patient Instructions (Addendum)
PLEASE CALL us BACK WITH YOUR COVID SHOT DATES AND WHICH SHOT IT WAS.  Go to: [email or web address]  Booster Information COVID-19 BOOSTER UPDATE: Everyone Ages 18+ Is Eligible for a COVID-19 Booster  Appointments are required. To register for your free vaccination booster appointment,  call [phone] (Mon-Fri 7 a.m. to 7 p.m).  There is a calendar link to schedule on the website at various locations around the [place].  Pfizer-BioNTech or Moderna, eligible 6 months after receiving your second shot. Single-dose [employer], eligible 2 months after receiving your initial shot. Following FDA and CDC recommendations, we can "mix-and-match" booster doses at the clinics. You must attest to the date you received your last dose of the [place], [place] or Wynetta Emery and [employer] vaccine to schedule your booster dose.  CHANGE YOUR SHORT-ACTING INSULIN TO TWICE A DAY BEFORE MEALS BECAUSE YOU DON'T EAT THREE TIMES A DAY.
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
## 2020-09-09 ENCOUNTER — Encounter: Payer: Self-pay | Admitting: Podiatry

## 2020-09-09 ENCOUNTER — Ambulatory Visit: Payer: Medicare HMO | Admitting: Podiatry

## 2020-09-09 ENCOUNTER — Other Ambulatory Visit: Payer: Self-pay

## 2020-09-09 DIAGNOSIS — B351 Tinea unguium: Secondary | ICD-10-CM | POA: Diagnosis not present

## 2020-09-09 DIAGNOSIS — M79675 Pain in left toe(s): Secondary | ICD-10-CM | POA: Diagnosis not present

## 2020-09-09 DIAGNOSIS — B078 Other viral warts: Secondary | ICD-10-CM | POA: Diagnosis not present

## 2020-09-09 DIAGNOSIS — E1165 Type 2 diabetes mellitus with hyperglycemia: Secondary | ICD-10-CM

## 2020-09-09 DIAGNOSIS — D485 Neoplasm of uncertain behavior of skin: Secondary | ICD-10-CM | POA: Diagnosis not present

## 2020-09-09 DIAGNOSIS — M79674 Pain in right toe(s): Secondary | ICD-10-CM

## 2020-09-09 NOTE — Patient Instructions (Signed)
Diabetes Mellitus and Foot Care Foot care is an important part of your health, especially when you have diabetes. Diabetes may cause you to have problems because of poor blood flow (circulation) to your feet and legs, which can cause your skin to:  Become thinner and drier.  Break more easily.  Heal more slowly.  Peel and crack. You may also have nerve damage (neuropathy) in your legs and feet, causing decreased feeling in them. This means that you may not notice minor injuries to your feet that could lead to more serious problems. Noticing and addressing any potential problems early is the best way to prevent future foot problems. How to care for your feet Foot hygiene  Wash your feet daily with warm water and mild soap. Do not use hot water. Then, pat your feet and the areas between your toes until they are completely dry. Do not soak your feet as this can dry your skin.  Trim your toenails straight across. Do not dig under them or around the cuticle. File the edges of your nails with an emery board or nail file.  Apply a moisturizing lotion or petroleum jelly to the skin on your feet and to dry, brittle toenails. Use lotion that does not contain alcohol and is unscented. Do not apply lotion between your toes. Shoes and socks  Wear clean socks or stockings every day. Make sure they are not too tight. Do not wear knee-high stockings since they may decrease blood flow to your legs.  Wear shoes that fit properly and have enough cushioning. Always look in your shoes before you put them on to be sure there are no objects inside.  To break in new shoes, wear them for just a few hours a day. This prevents injuries on your feet. Wounds, scrapes, corns, and calluses  Check your feet daily for blisters, cuts, bruises, sores, and redness. If you cannot see the bottom of your feet, use a mirror or ask someone for help.  Do not cut corns or calluses or try to remove them with medicine.  If you  find a minor scrape, cut, or break in the skin on your feet, keep it and the skin around it clean and dry. You may clean these areas with mild soap and water. Do not clean the area with peroxide, alcohol, or iodine.  If you have a wound, scrape, corn, or callus on your foot, look at it several times a day to make sure it is healing and not infected. Check for: ? Redness, swelling, or pain. ? Fluid or blood. ? Warmth. ? Pus or a bad smell. General instructions  Do not cross your legs. This may decrease blood flow to your feet.  Do not use heating pads or hot water bottles on your feet. They may burn your skin. If you have lost feeling in your feet or legs, you may not know this is happening until it is too late.  Protect your feet from hot and cold by wearing shoes, such as at the beach or on hot pavement.  Schedule a complete foot exam at least once a year (annually) or more often if you have foot problems. If you have foot problems, report any cuts, sores, or bruises to your health care provider immediately. Contact a health care provider if:  You have a medical condition that increases your risk of infection and you have any cuts, sores, or bruises on your feet.  You have an injury that is not   healing.  You have redness on your legs or feet.  You feel burning or tingling in your legs or feet.  You have pain or cramps in your legs and feet.  Your legs or feet are numb.  Your feet always feel cold.  You have pain around a toenail. Get help right away if:  You have a wound, scrape, corn, or callus on your foot and: ? You have pain, swelling, or redness that gets worse. ? You have fluid or blood coming from the wound, scrape, corn, or callus. ? Your wound, scrape, corn, or callus feels warm to the touch. ? You have pus or a bad smell coming from the wound, scrape, corn, or callus. ? You have a fever. ? You have a red line going up your leg. Summary  Check your feet every day  for cuts, sores, red spots, swelling, and blisters.  Moisturize feet and legs daily.  Wear shoes that fit properly and have enough cushioning.  If you have foot problems, report any cuts, sores, or bruises to your health care provider immediately.  Schedule a complete foot exam at least once a year (annually) or more often if you have foot problems. This information is not intended to replace advice given to you by your health care provider. Make sure you discuss any questions you have with your health care provider. Document Revised: 05/17/2019 Document Reviewed: 09/25/2016 Elsevier Patient Education  2020 Elsevier Inc.  

## 2020-09-10 NOTE — Progress Notes (Signed)
  Subjective:  Patient ID: Matthew Dominguez, male    DOB: [birth year],  MRN: [id number]  Chief Complaint  Patient presents with  . Nail Problem  . Diabetes    Nail trim [facility]   no complaints today    70 y.o. male presents with the above complaint. History confirmed with patient.  He was referred to [place] by his PCP.  He says his A1c is "good" but does not know the number.  Says his recent blood sugars have run in the low 200s.  Objective:  Physical Exam: warm, good capillary refill, no trophic changes or ulcerative lesions and normal DP and PT pulses.  Abnormal monofilament exam with loss of protective sensation in the tips of the toes.  He has onychomycosis x10 with thickening, subungual debris and yellow discoloration of the toenails Assessment:   1. Uncontrolled type 2 diabetes mellitus with hyperglycemia (HCC)   2. Onychomycosis   3. Pain due to onychomycosis of toenails of both feet      Plan:  Patient was evaluated and treated and all questions answered.   Patient educated on diabetes. Discussed proper diabetic foot care and discussed risks and complications of disease. Educated patient in depth on reasons to return to the office immediately should he/she discover anything concerning or new on the feet. All questions answered. Discussed proper shoes as well.  Discussed the etiology and treatment options for the condition in detail with the patient. Educated patient on the topical and oral treatment options for mycotic nails. Recommended debridement of the nails today. Sharp and mechanical debridement performed of all painful and mycotic nails today. Nails debrided in length and thickness using a nail nipper and a mechanical burr to level of comfort. Discussed treatment options including appropriate shoe gear. Follow up as needed for painful nails.    Return in about 3 months (around 12/08/2020) for at risk diabetic foot care.

## 2020-09-19 ENCOUNTER — Other Ambulatory Visit: Payer: Self-pay | Admitting: Internal Medicine

## 2020-10-28 ENCOUNTER — Encounter: Payer: Self-pay | Admitting: Internal Medicine

## 2020-11-03 ENCOUNTER — Other Ambulatory Visit: Payer: Self-pay | Admitting: Internal Medicine

## 2020-12-06 ENCOUNTER — Other Ambulatory Visit: Payer: Self-pay

## 2020-12-06 DIAGNOSIS — E1165 Type 2 diabetes mellitus with hyperglycemia: Secondary | ICD-10-CM

## 2020-12-06 MED ORDER — NOVOLIN R FLEXPEN 100 UNIT/ML IJ SOPN: 18.0000 [IU] | PEN_INJECTOR | Freq: Two times a day (BID) | INTRAMUSCULAR | 0 refills | Status: DC

## 2020-12-10 ENCOUNTER — Other Ambulatory Visit: Payer: Self-pay | Admitting: Nurse Practitioner

## 2020-12-10 DIAGNOSIS — E1165 Type 2 diabetes mellitus with hyperglycemia: Secondary | ICD-10-CM

## 2020-12-12 ENCOUNTER — Other Ambulatory Visit: Payer: Self-pay

## 2020-12-12 ENCOUNTER — Encounter: Payer: Self-pay | Admitting: Podiatry

## 2020-12-12 ENCOUNTER — Ambulatory Visit: Payer: Medicare HMO | Admitting: Podiatry

## 2020-12-12 DIAGNOSIS — M79674 Pain in right toe(s): Secondary | ICD-10-CM | POA: Diagnosis not present

## 2020-12-12 DIAGNOSIS — B351 Tinea unguium: Secondary | ICD-10-CM

## 2020-12-12 DIAGNOSIS — E1165 Type 2 diabetes mellitus with hyperglycemia: Secondary | ICD-10-CM

## 2020-12-12 DIAGNOSIS — M79675 Pain in left toe(s): Secondary | ICD-10-CM

## 2020-12-12 NOTE — Progress Notes (Signed)
  Subjective:  Patient ID: Matthew Dominguez, male    DOB: 09-Feb-1951,  MRN: [id number]  Chief Complaint  Patient presents with  . Nail Problem    Nail trim     70 y.o. male presents with the above complaint. History confirmed with patient.  Nails are elongated and bothersome again, he reports no new issues  Objective:  Physical Exam: warm, good capillary refill, no trophic changes or ulcerative lesions and normal DP and PT pulses.  Abnormal monofilament exam with loss of protective sensation in the tips of the toes.  He has onychomycosis x10 with thickening, subungual debris and yellow discoloration of the toenails Assessment:   1. Pain due to onychomycosis of toenails of both feet   2. Uncontrolled type 2 diabetes mellitus with hyperglycemia ([place])      Plan:  Patient was evaluated and treated and all questions answered.   Patient educated on diabetes. Discussed proper diabetic foot care and discussed risks and complications of disease. Educated patient in depth on reasons to return to the office immediately should he/she discover anything concerning or new on the feet. All questions answered. Discussed proper shoes as well.   Discussed the etiology and treatment options for the condition in detail with the patient. Educated patient on the topical and oral treatment options for mycotic nails. Recommended debridement of the nails today. Sharp and mechanical debridement performed of all painful and mycotic nails today. Nails debrided in length and thickness using a nail nipper and a mechanical burr to level of comfort. Discussed treatment options including appropriate shoe gear. Follow up as needed for painful nails.    Return in about 3 months (around 03/13/2021) for at risk diabetic foot care.

## 2020-12-18 ENCOUNTER — Other Ambulatory Visit: Payer: Medicare HMO

## 2020-12-18 ENCOUNTER — Other Ambulatory Visit: Payer: Self-pay

## 2020-12-18 DIAGNOSIS — E1165 Type 2 diabetes mellitus with hyperglycemia: Secondary | ICD-10-CM | POA: Diagnosis not present

## 2020-12-19 LAB — COMPLETE METABOLIC PANEL WITH GFR
AG Ratio: 1.9 (calc) (ref 1.0–2.5)
ALT: 100 U/L — ABNORMAL HIGH (ref 9–46)
AST: 75 U/L — ABNORMAL HIGH (ref 10–35)
Albumin: 4.5 g/dL (ref 3.6–5.1)
Alkaline phosphatase (APISO): 71 U/L (ref 35–144)
BUN/Creatinine Ratio: 17 (calc) (ref 6–22)
BUN: 12 mg/dL (ref 7–25)
CO2: 27 mmol/L (ref 20–32)
Calcium: 9.6 mg/dL (ref 8.6–10.3)
Chloride: 99 mmol/L (ref 98–110)
Creat: 0.69 mg/dL — ABNORMAL LOW (ref 0.70–1.18)
GFR, Est African American: 111 mL/min/{1.73_m2} (ref >=60)
GFR, Est Non African American: 96 mL/min/{1.73_m2} (ref >=60)
Globulin: 2.4 g/dL (calc) (ref 1.9–3.7)
Glucose, Bld: 367 mg/dL — ABNORMAL HIGH (ref 65–99)
Potassium: 4.3 mmol/L (ref 3.5–5.3)
Sodium: 137 mmol/L (ref 135–146)
Total Bilirubin: 0.6 mg/dL (ref 0.2–1.2)
Total Protein: 6.9 g/dL (ref 6.1–8.1)

## 2020-12-19 LAB — LIPID PANEL
Cholesterol: 272 mg/dL — ABNORMAL HIGH (ref <200)
HDL: 46 mg/dL (ref >=40)
Non-HDL Cholesterol (Calc): 226 mg/dL (calc) — ABNORMAL HIGH (ref <130)
Total CHOL/HDL Ratio: 5.9 (calc) — ABNORMAL HIGH (ref <5.0)
Triglycerides: 1132 mg/dL — ABNORMAL HIGH (ref <150)

## 2020-12-19 LAB — HEMOGLOBIN A1C
Hgb A1c MFr Bld: 12.5 % of total Hgb — ABNORMAL HIGH (ref <5.7)
Mean Plasma Glucose: 312 mg/dL
eAG (mmol/L): 17.3 mmol/L

## 2020-12-23 ENCOUNTER — Encounter: Payer: Self-pay | Admitting: Nurse Practitioner

## 2020-12-23 ENCOUNTER — Ambulatory Visit (INDEPENDENT_AMBULATORY_CARE_PROVIDER_SITE_OTHER): Payer: Medicare HMO | Admitting: Nurse Practitioner

## 2020-12-23 ENCOUNTER — Other Ambulatory Visit: Payer: Self-pay

## 2020-12-23 ENCOUNTER — Ambulatory Visit: Payer: Medicare HMO | Admitting: Internal Medicine

## 2020-12-23 VITALS — BP 148/78 | HR 93 | Temp 98.6°F | Ht 67.0 in | Wt 192.6 lb

## 2020-12-23 DIAGNOSIS — N401 Enlarged prostate with lower urinary tract symptoms: Secondary | ICD-10-CM

## 2020-12-23 DIAGNOSIS — E782 Mixed hyperlipidemia: Secondary | ICD-10-CM | POA: Diagnosis not present

## 2020-12-23 DIAGNOSIS — R7401 Elevation of levels of liver transaminase levels: Secondary | ICD-10-CM

## 2020-12-23 DIAGNOSIS — R350 Frequency of micturition: Secondary | ICD-10-CM | POA: Diagnosis not present

## 2020-12-23 DIAGNOSIS — I10 Essential (primary) hypertension: Secondary | ICD-10-CM | POA: Diagnosis not present

## 2020-12-23 DIAGNOSIS — E1165 Type 2 diabetes mellitus with hyperglycemia: Secondary | ICD-10-CM

## 2020-12-23 DIAGNOSIS — J449 Chronic obstructive pulmonary disease, unspecified: Secondary | ICD-10-CM | POA: Diagnosis not present

## 2020-12-23 DIAGNOSIS — Z794 Long term (current) use of insulin: Secondary | ICD-10-CM | POA: Diagnosis not present

## 2020-12-23 MED ORDER — LANTUS SOLOSTAR 100 UNIT/ML ~~LOC~~ SOPN: 58.0000 [IU] | PEN_INJECTOR | Freq: Every day | SUBCUTANEOUS | 11 refills | Status: DC

## 2020-12-23 MED ORDER — BISOPROLOL FUMARATE 10 MG PO TABS: 10.0000 mg | ORAL_TABLET | Freq: Every day | ORAL | 0 refills | Status: AC

## 2020-12-23 MED ORDER — NOVOLIN R FLEXPEN 100 UNIT/ML IJ SOPN
18.0000 [IU] | PEN_INJECTOR | Freq: Two times a day (BID) | INTRAMUSCULAR | 0 refills | Status: DC
Start: 2020-12-23 — End: 2021-02-28

## 2020-12-23 MED ORDER — FENOFIBRATE 160 MG PO TABS: 160.0000 mg | ORAL_TABLET | Freq: Every day | ORAL | 3 refills | Status: DC

## 2020-12-23 MED ORDER — FINASTERIDE 5 MG PO TABS: 5.0000 mg | ORAL_TABLET | Freq: Every day | ORAL | 1 refills | Status: DC
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
## 2020-12-23 NOTE — Patient Instructions (Addendum)
To increase Lantus to 58 units at bedtime.  To take Novolin 18 units with each meal.   Refill sent to pharmacy for your fenofibrate and your finasteride.  Very important to restart your fenofibrate for your cholesterol- at risk for pancreatitis due to elevation   To try to significantly decrease/stop your cheese intake and decrease sweets  Follow up in 6 weeks to see Webb Silversmith, NP with labs prior

## 2020-12-25 ENCOUNTER — Other Ambulatory Visit: Payer: Self-pay | Admitting: Internal Medicine

## 2020-12-25 DIAGNOSIS — F5104 Psychophysiologic insomnia: Secondary | ICD-10-CM

## 2020-12-25 NOTE — Telephone Encounter (Signed)
Pharmacy requested refill Epic LR: 06/17/2020 Contract on File.  Pended Rx and sent to [facility] for approval.

## 2021-01-28 ENCOUNTER — Other Ambulatory Visit: Payer: Self-pay | Admitting: Family

## 2021-01-28 DIAGNOSIS — F5104 Psychophysiologic insomnia: Secondary | ICD-10-CM

## 2021-01-28 NOTE — Telephone Encounter (Signed)
Last refilled on 12/25/2020, treatment agreement on file from August 2022

## 2021-01-28 NOTE — Telephone Encounter (Signed)
Medications pended and sent to Man X Mast,NP  for approval. Marlowe Sax, NP is out of office.

## 2021-02-04 ENCOUNTER — Other Ambulatory Visit: Payer: Medicare HMO

## 2021-02-04 ENCOUNTER — Other Ambulatory Visit: Payer: Self-pay

## 2021-02-04 DIAGNOSIS — E782 Mixed hyperlipidemia: Secondary | ICD-10-CM | POA: Diagnosis not present

## 2021-02-05 LAB — COMPLETE METABOLIC PANEL WITH GFR
AG Ratio: 1.9 (calc) (ref 1.0–2.5)
ALT: 98 U/L — ABNORMAL HIGH (ref 9–46)
AST: 63 U/L — ABNORMAL HIGH (ref 10–35)
Albumin: 4.3 g/dL (ref 3.6–5.1)
Alkaline phosphatase (APISO): 61 U/L (ref 35–144)
BUN: 12 mg/dL (ref 7–25)
CO2: 28 mmol/L (ref 20–32)
Calcium: 9.9 mg/dL (ref 8.6–10.3)
Chloride: 98 mmol/L (ref 98–110)
Creat: 0.76 mg/dL (ref 0.70–1.18)
GFR, Est African American: 106 mL/min/{1.73_m2} (ref >=60)
GFR, Est Non African American: 91 mL/min/{1.73_m2} (ref >=60)
Globulin: 2.3 g/dL (calc) (ref 1.9–3.7)
Glucose, Bld: 474 mg/dL — ABNORMAL HIGH (ref 65–99)
Potassium: 4.7 mmol/L (ref 3.5–5.3)
Sodium: 136 mmol/L (ref 135–146)
Total Bilirubin: 0.6 mg/dL (ref 0.2–1.2)
Total Protein: 6.6 g/dL (ref 6.1–8.1)

## 2021-02-05 LAB — LIPID PANEL
Cholesterol: 222 mg/dL — ABNORMAL HIGH (ref <200)
HDL: 55 mg/dL (ref >=40)
LDL Cholesterol (Calc): 119 mg/dL (calc) — ABNORMAL HIGH
Non-HDL Cholesterol (Calc): 167 mg/dL (calc) — ABNORMAL HIGH (ref <130)
Total CHOL/HDL Ratio: 4 (calc) (ref <5.0)
Triglycerides: 336 mg/dL — ABNORMAL HIGH (ref <150)

## 2021-02-07 ENCOUNTER — Ambulatory Visit: Payer: Medicare HMO | Admitting: Family

## 2021-02-18 ENCOUNTER — Ambulatory Visit: Payer: Medicare HMO | Admitting: Family

## 2021-02-27 ENCOUNTER — Other Ambulatory Visit: Payer: Self-pay | Admitting: Nurse Practitioner

## 2021-02-27 DIAGNOSIS — E1165 Type 2 diabetes mellitus with hyperglycemia: Secondary | ICD-10-CM

## 2021-02-28 ENCOUNTER — Other Ambulatory Visit: Payer: Self-pay | Admitting: *Deleted

## 2021-02-28 DIAGNOSIS — J432 Centrilobular emphysema: Secondary | ICD-10-CM

## 2021-02-28 MED ORDER — SYMBICORT 80-4.5 MCG/ACT IN AERO: INHALATION_SPRAY | RESPIRATORY_TRACT | 5 refills | Status: AC

## 2021-02-28 NOTE — Telephone Encounter (Signed)
[facility] requested refill.

## 2021-03-02 ENCOUNTER — Other Ambulatory Visit: Payer: Self-pay | Admitting: Nurse Practitioner

## 2021-03-02 DIAGNOSIS — F5104 Psychophysiologic insomnia: Secondary | ICD-10-CM

## 2021-03-03 NOTE — Telephone Encounter (Signed)
Both rx's last filled on 01/28/2021, treatment agreement on file from 04/18/2020

## 2021-04-07 ENCOUNTER — Other Ambulatory Visit: Payer: Self-pay | Admitting: Family

## 2021-04-07 DIAGNOSIS — F5104 Psychophysiologic insomnia: Secondary | ICD-10-CM

## 2021-04-08 NOTE — Telephone Encounter (Signed)
Patient has request refill on medications "Lorazepam '1mg'$ ", and "Ambien '5mg'$ ". Patient last refill for both medications was dated 03/03/2021. Patient medication pend and sent to PCP Nget[place], Nelda Bucks, NP for approval. Please Advise.

## 2021-04-09 ENCOUNTER — Other Ambulatory Visit: Payer: Self-pay | Admitting: *Deleted

## 2021-04-09 MED ORDER — TAMSULOSIN HCL 0.4 MG PO CAPS: 0.4000 mg | ORAL_CAPSULE | Freq: Every day | ORAL | 1 refills | Status: DC

## 2021-04-09 NOTE — Telephone Encounter (Signed)
[facility] requested refill  

## 2021-04-17 ENCOUNTER — Encounter: Payer: Self-pay | Admitting: Family

## 2021-04-18 ENCOUNTER — Encounter: Payer: Self-pay | Admitting: Family

## 2021-04-18 ENCOUNTER — Other Ambulatory Visit: Payer: Self-pay

## 2021-04-18 ENCOUNTER — Ambulatory Visit (INDEPENDENT_AMBULATORY_CARE_PROVIDER_SITE_OTHER): Payer: Medicare HMO | Admitting: Family

## 2021-04-18 VITALS — BP 142/92 | HR 89 | Temp 98.4°F | Ht 67.0 in | Wt 184.4 lb

## 2021-04-18 DIAGNOSIS — E1165 Type 2 diabetes mellitus with hyperglycemia: Secondary | ICD-10-CM

## 2021-04-18 DIAGNOSIS — R748 Abnormal levels of other serum enzymes: Secondary | ICD-10-CM

## 2021-04-18 DIAGNOSIS — F5101 Primary insomnia: Secondary | ICD-10-CM

## 2021-04-18 DIAGNOSIS — N400 Enlarged prostate without lower urinary tract symptoms: Secondary | ICD-10-CM | POA: Diagnosis not present

## 2021-04-18 DIAGNOSIS — K219 Gastro-esophageal reflux disease without esophagitis: Secondary | ICD-10-CM | POA: Diagnosis not present

## 2021-04-18 DIAGNOSIS — Z23 Encounter for immunization: Secondary | ICD-10-CM

## 2021-04-18 DIAGNOSIS — E782 Mixed hyperlipidemia: Secondary | ICD-10-CM | POA: Diagnosis not present

## 2021-04-18 DIAGNOSIS — I10 Essential (primary) hypertension: Secondary | ICD-10-CM

## 2021-04-18 MED ORDER — TETANUS-DIPHTH-ACELL PERTUSSIS 5-2.5-18.5 LF-MCG/0.5 IM SUSP: 0.5000 mL | Freq: Once | INTRAMUSCULAR | 0 refills | Status: AC
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
## 2021-04-18 NOTE — Patient Instructions (Signed)
Please get your Tetanus Vaccine and COVID-19 booster vaccine at your pharmacy

## 2021-04-19 LAB — CBC WITH DIFFERENTIAL/PLATELET
Absolute Monocytes: 512 cells/uL (ref 200–950)
Basophils Absolute: 102 cells/uL (ref 0–200)
Basophils Relative: 1.6 %
Eosinophils Absolute: 77 cells/uL (ref 15–500)
Eosinophils Relative: 1.2 %
HCT: 52.9 % — ABNORMAL HIGH (ref 38.5–50.0)
Hemoglobin: 17.5 g/dL — ABNORMAL HIGH (ref 13.2–17.1)
Lymphs Abs: 947 cells/uL (ref 850–3900)
MCH: 30.8 pg (ref 27.0–33.0)
MCHC: 33.1 g/dL (ref 32.0–36.0)
MCV: 93.1 fL (ref 80.0–100.0)
MPV: 11.6 fL (ref 7.5–12.5)
Monocytes Relative: 8 %
Neutro Abs: 4762 cells/uL (ref 1500–7800)
Neutrophils Relative %: 74.4 %
Platelets: 206 10*3/uL (ref 140–400)
RBC: 5.68 10*6/uL (ref 4.20–5.80)
RDW: 12.8 % (ref 11.0–15.0)
Total Lymphocyte: 14.8 %
WBC: 6.4 10*3/uL (ref 3.8–10.8)

## 2021-04-19 LAB — LIPID PANEL
Cholesterol: 275 mg/dL — ABNORMAL HIGH (ref <200)
HDL: 45 mg/dL (ref >=40)
Non-HDL Cholesterol (Calc): 230 mg/dL (calc) — ABNORMAL HIGH (ref <130)
Total CHOL/HDL Ratio: 6.1 (calc) — ABNORMAL HIGH (ref <5.0)
Triglycerides: 576 mg/dL — ABNORMAL HIGH (ref <150)

## 2021-04-19 LAB — COMPLETE METABOLIC PANEL WITH GFR
AG Ratio: 1.7 (calc) (ref 1.0–2.5)
ALT: 100 U/L — ABNORMAL HIGH (ref 9–46)
AST: 73 U/L — ABNORMAL HIGH (ref 10–35)
Albumin: 4.3 g/dL (ref 3.6–5.1)
Alkaline phosphatase (APISO): 56 U/L (ref 35–144)
BUN: 18 mg/dL (ref 7–25)
CO2: 28 mmol/L (ref 20–32)
Calcium: 9.9 mg/dL (ref 8.6–10.3)
Chloride: 99 mmol/L (ref 98–110)
Creat: 0.74 mg/dL (ref 0.70–1.28)
Globulin: 2.5 g/dL (calc) (ref 1.9–3.7)
Glucose, Bld: 409 mg/dL — ABNORMAL HIGH (ref 65–139)
Potassium: 4.4 mmol/L (ref 3.5–5.3)
Sodium: 138 mmol/L (ref 135–146)
Total Bilirubin: 0.7 mg/dL (ref 0.2–1.2)
Total Protein: 6.8 g/dL (ref 6.1–8.1)
eGFR: 96 mL/min/{1.73_m2} (ref >=60)

## 2021-04-19 LAB — TSH: TSH: 1.59 mIU/L (ref 0.40–4.50)

## 2021-04-19 LAB — HEMOGLOBIN A1C
Hgb A1c MFr Bld: 12.2 % of total Hgb — ABNORMAL HIGH (ref <5.7)
Mean Plasma Glucose: 303 mg/dL
eAG (mmol/L): 16.8 mmol/L

## 2021-04-22 ENCOUNTER — Telehealth: Payer: Self-pay | Admitting: Nurse Practitioner

## 2021-04-22 NOTE — Telephone Encounter (Signed)
04/19/21 lab called, reported glucose 403, non fasting. Inform the patient of his glucose, the patient stated he is in his usual state of health. Advised the patient to monitor CBGs closely, f/u with PCP.

## 2021-05-06 ENCOUNTER — Telehealth: Payer: Self-pay | Admitting: Family

## 2021-05-06 NOTE — Telephone Encounter (Signed)
I called the patient and left a vm to call the office so we can schedule him an AWV

## 2021-05-09 ENCOUNTER — Other Ambulatory Visit: Payer: Self-pay | Admitting: Family

## 2021-05-09 DIAGNOSIS — F5104 Psychophysiologic insomnia: Secondary | ICD-10-CM

## 2021-05-09 NOTE — Telephone Encounter (Signed)
Pharmacy requested refill.  Epic LR: 04/08/2021 Contract need updated. Added note to upcoming appointment.  Pended Rx's and sent to [facility] for approval due to [place] out of office.

## 2021-06-09 ENCOUNTER — Other Ambulatory Visit: Payer: Self-pay | Admitting: Nurse Practitioner

## 2021-06-09 DIAGNOSIS — F5104 Psychophysiologic insomnia: Secondary | ICD-10-CM

## 2021-06-10 NOTE — Telephone Encounter (Signed)
Patient has request refill on medications "Ambien" and "Lorazepam". Patient last refill for both medications was 05/09/2021. Medications pend and sent to PCP Ngetich, Nelda Bucks, NP . Please Advise.

## 2021-06-23 ENCOUNTER — Other Ambulatory Visit: Payer: Self-pay | Admitting: Family

## 2021-06-23 DIAGNOSIS — E1165 Type 2 diabetes mellitus with hyperglycemia: Secondary | ICD-10-CM

## 2021-07-15 ENCOUNTER — Other Ambulatory Visit: Payer: Self-pay

## 2021-07-15 ENCOUNTER — Encounter: Payer: Self-pay | Admitting: Family

## 2021-07-15 ENCOUNTER — Ambulatory Visit (INDEPENDENT_AMBULATORY_CARE_PROVIDER_SITE_OTHER): Payer: Medicare HMO | Admitting: Family

## 2021-07-15 VITALS — BP 120/70 | HR 89 | Temp 97.3°F | Resp 16 | Ht 67.0 in | Wt 181.6 lb

## 2021-07-15 DIAGNOSIS — E1165 Type 2 diabetes mellitus with hyperglycemia: Secondary | ICD-10-CM

## 2021-07-15 DIAGNOSIS — G8929 Other chronic pain: Secondary | ICD-10-CM

## 2021-07-15 DIAGNOSIS — M5441 Lumbago with sciatica, right side: Secondary | ICD-10-CM | POA: Diagnosis not present

## 2021-07-15 MED ORDER — IBUPROFEN 600 MG PO TABS: 600.0000 mg | ORAL_TABLET | Freq: Three times a day (TID) | ORAL | 0 refills | Status: DC | PRN
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
## 2021-07-15 NOTE — Patient Instructions (Signed)
- Notify provider if symptoms worsen or develops any numbness,tingling or weakness on the leg  Sciatica Sciatica is pain, numbness, weakness, or tingling along the path of the sciatic nerve. The sciatic nerve starts in the lower back and runs down the back of each leg. The nerve controls the muscles in the lower leg and in the back of the knee. It also provides feeling (sensation) to the back of the thigh, the lower leg, and the sole of the foot. Sciatica is a symptom of another medical condition that pinches or puts pressure on the sciatic nerve. Sciatica most often only affects one side of the body. Sciatica usually goes away on its own or with treatment. In some cases, sciatica may come back (recur). What are the causes? This condition is caused by pressure on the sciatic nerve or pinching of the nerve. This may be the result of: A disk in between the bones of the spine bulging out too far (herniated disk). Age-related changes in the spinal disks. A pain disorder that affects a muscle in the buttock. Extra bone growth near the sciatic nerve. A break (fracture) of the pelvis. Pregnancy. Tumor. This is rare. What increases the risk? The following factors may make you more likely to develop this condition: Playing sports that place pressure or stress on the spine. Having poor strength and flexibility. A history of back injury or surgery. Sitting for long periods of time. Doing activities that involve repetitive bending or lifting. Obesity. What are the signs or symptoms? Symptoms can vary from mild to very severe, and they may include: Any of these problems in the lower back, leg, hip, or buttock: Mild tingling, numbness, or dull aches. Burning sensations. Sharp pains. Numbness in the back of the calf or the sole of the foot. Leg weakness. Severe back pain that makes movement difficult. Symptoms may get worse when you cough, sneeze, or laugh, or when you sit or stand for long periods  of time. How is this diagnosed? This condition may be diagnosed based on: Your symptoms and medical history. A physical exam. Blood tests. Imaging tests, such as: X-rays. MRI. CT scan. How is this treated? In many cases, this condition improves on its own without treatment. However, treatment may include: Reducing or modifying physical activity. Exercising and stretching. Icing and applying heat to the affected area. Medicines that help to: Relieve pain and swelling. Relax your muscles. Injections of medicines that help to relieve pain, irritation, and inflammation around the sciatic nerve (steroids). Surgery. Follow these instructions at home: Medicines Take over-the-counter and prescription medicines only as told by your health care provider. Ask your health care provider if the medicine prescribed to you: Requires you to avoid driving or using heavy machinery. Can cause constipation. You may need to take these actions to prevent or treat constipation: Drink enough fluid to keep your urine pale yellow. Take over-the-counter or prescription medicines. Eat foods that are high in fiber, such as beans, whole grains, and fresh fruits and vegetables. Limit foods that are high in fat and processed sugars, such as fried or sweet foods. Managing pain   If directed, put ice on the affected area. Put ice in a plastic bag. Place a towel between your skin and the bag. Leave the ice on for 20 minutes, 2-3 times a day. If directed, apply heat to the affected area. Use the heat source that your health care provider recommends, such as a moist heat pack or a heating pad. Place a  towel between your skin and the heat source. Leave the heat on for 20-30 minutes. Remove the heat if your skin turns bright red. This is especially important if you are unable to feel pain, heat, or cold. You may have a greater risk of getting burned. Activity  Return to your normal activities as told by your  health care provider. Ask your health care provider what activities are safe for you. Avoid activities that make your symptoms worse. Take brief periods of rest throughout the day. When you rest for longer periods, mix in some mild activity or stretching between periods of rest. This will help to prevent stiffness and pain. Avoid sitting for long periods of time without moving. Get up and move around at least one time each hour. Exercise and stretch regularly, as told by your health care provider. Do not lift anything that is heavier than 10 lb (4.5 kg) while you have symptoms of sciatica. When you do not have symptoms, you should still avoid heavy lifting, especially repetitive heavy lifting. When you lift objects, always use proper lifting technique, which includes: Bending your knees. Keeping the load close to your body. Avoiding twisting. General instructions Maintain a healthy weight. Excess weight puts extra stress on your back. Wear supportive, comfortable shoes. Avoid wearing high heels. Avoid sleeping on a mattress that is too soft or too hard. A mattress that is firm enough to support your back when you sleep may help to reduce your pain. Keep all follow-up visits as told by your health care provider. This is important. Contact a health care provider if: You have pain that: Wakes you up when you are sleeping. Gets worse when you lie down. Is worse than you have experienced in the past. Lasts longer than 4 weeks. You have an unexplained weight loss. Get help right away if: You are not able to control when you urinate or have bowel movements (incontinence). You have: Weakness in your lower back, pelvis, buttocks, or legs that gets worse. Redness or swelling of your back. A burning sensation when you urinate. Summary Sciatica is pain, numbness, weakness, or tingling along the path of the sciatic nerve. This condition is caused by pressure on the sciatic nerve or pinching of the  nerve. Sciatica can cause pain, numbness, or tingling in the lower back, legs, hips, and buttocks. Treatment often includes rest, exercise, medicines, and applying ice or heat. This information is not intended to replace advice given to you by your health care provider. Make sure you discuss any questions you have with your health care provider. Document Revised: 09/12/2018 Document Reviewed: 09/12/2018 Elsevier Patient Education  [employer].

## 2021-07-17 ENCOUNTER — Other Ambulatory Visit: Payer: Self-pay | Admitting: Family

## 2021-07-17 DIAGNOSIS — F5104 Psychophysiologic insomnia: Secondary | ICD-10-CM

## 2021-07-25 ENCOUNTER — Emergency Department (HOSPITAL_COMMUNITY): Payer: Medicare HMO

## 2021-07-25 ENCOUNTER — Inpatient Hospital Stay (HOSPITAL_COMMUNITY)
Admission: EM | Admit: 2021-07-25 | Discharge: 2021-08-14 | DRG: 853 | Disposition: A | Discharge status: Home / Self Care | Payer: Medicare HMO | Attending: Internal Medicine | Admitting: Internal Medicine

## 2021-07-25 ENCOUNTER — Emergency Department (HOSPITAL_COMMUNITY): Payer: Medicare HMO | Admitting: Anesthesiology

## 2021-07-25 ENCOUNTER — Encounter (HOSPITAL_COMMUNITY): Payer: Self-pay

## 2021-07-25 ENCOUNTER — Other Ambulatory Visit: Payer: Self-pay

## 2021-07-25 ENCOUNTER — Encounter (HOSPITAL_COMMUNITY)
Admission: EM | Disposition: A | Discharge status: Home / Self Care | Payer: Self-pay | Source: Home / Self Care | Attending: Pulmonary Disease

## 2021-07-25 DIAGNOSIS — M19012 Primary osteoarthritis, left shoulder: Secondary | ICD-10-CM | POA: Diagnosis not present

## 2021-07-25 DIAGNOSIS — F03918 Unspecified dementia, unspecified severity, with other behavioral disturbance: Secondary | ICD-10-CM | POA: Diagnosis present

## 2021-07-25 DIAGNOSIS — J9811 Atelectasis: Secondary | ICD-10-CM | POA: Diagnosis present

## 2021-07-25 DIAGNOSIS — I81 Portal vein thrombosis: Secondary | ICD-10-CM | POA: Diagnosis present

## 2021-07-25 DIAGNOSIS — Z978 Presence of other specified devices: Secondary | ICD-10-CM | POA: Diagnosis not present

## 2021-07-25 DIAGNOSIS — G4700 Insomnia, unspecified: Secondary | ICD-10-CM | POA: Diagnosis present

## 2021-07-25 DIAGNOSIS — Z043 Encounter for examination and observation following other accident: Secondary | ICD-10-CM | POA: Diagnosis not present

## 2021-07-25 DIAGNOSIS — J449 Chronic obstructive pulmonary disease, unspecified: Secondary | ICD-10-CM | POA: Diagnosis present

## 2021-07-25 DIAGNOSIS — R140 Abdominal distension (gaseous): Secondary | ICD-10-CM | POA: Diagnosis not present

## 2021-07-25 DIAGNOSIS — K7589 Other specified inflammatory liver diseases: Secondary | ICD-10-CM | POA: Diagnosis present

## 2021-07-25 DIAGNOSIS — E785 Hyperlipidemia, unspecified: Secondary | ICD-10-CM | POA: Diagnosis present

## 2021-07-25 DIAGNOSIS — Z4682 Encounter for fitting and adjustment of non-vascular catheter: Secondary | ICD-10-CM | POA: Diagnosis not present

## 2021-07-25 DIAGNOSIS — R0902 Hypoxemia: Secondary | ICD-10-CM

## 2021-07-25 DIAGNOSIS — E11649 Type 2 diabetes mellitus with hypoglycemia without coma: Secondary | ICD-10-CM | POA: Diagnosis present

## 2021-07-25 DIAGNOSIS — F419 Anxiety disorder, unspecified: Secondary | ICD-10-CM | POA: Diagnosis present

## 2021-07-25 DIAGNOSIS — R0602 Shortness of breath: Secondary | ICD-10-CM

## 2021-07-25 DIAGNOSIS — I10 Essential (primary) hypertension: Secondary | ICD-10-CM | POA: Diagnosis not present

## 2021-07-25 DIAGNOSIS — J9602 Acute respiratory failure with hypercapnia: Secondary | ICD-10-CM

## 2021-07-25 DIAGNOSIS — F05 Delirium due to known physiological condition: Secondary | ICD-10-CM | POA: Diagnosis not present

## 2021-07-25 DIAGNOSIS — K567 Ileus, unspecified: Secondary | ICD-10-CM | POA: Diagnosis not present

## 2021-07-25 DIAGNOSIS — R188 Other ascites: Secondary | ICD-10-CM | POA: Diagnosis not present

## 2021-07-25 DIAGNOSIS — R55 Syncope and collapse: Secondary | ICD-10-CM | POA: Diagnosis not present

## 2021-07-25 DIAGNOSIS — B159 Hepatitis A without hepatic coma: Secondary | ICD-10-CM | POA: Diagnosis present

## 2021-07-25 DIAGNOSIS — Z79899 Other long term (current) drug therapy: Secondary | ICD-10-CM

## 2021-07-25 DIAGNOSIS — J432 Centrilobular emphysema: Secondary | ICD-10-CM | POA: Diagnosis not present

## 2021-07-25 DIAGNOSIS — Z7984 Long term (current) use of oral hypoglycemic drugs: Secondary | ICD-10-CM

## 2021-07-25 DIAGNOSIS — A419 Sepsis, unspecified organism: Principal | ICD-10-CM | POA: Diagnosis present

## 2021-07-25 DIAGNOSIS — M19011 Primary osteoarthritis, right shoulder: Secondary | ICD-10-CM | POA: Diagnosis not present

## 2021-07-25 DIAGNOSIS — E1169 Type 2 diabetes mellitus with other specified complication: Secondary | ICD-10-CM | POA: Diagnosis not present

## 2021-07-25 DIAGNOSIS — R531 Weakness: Secondary | ICD-10-CM | POA: Diagnosis not present

## 2021-07-25 DIAGNOSIS — Z9842 Cataract extraction status, left eye: Secondary | ICD-10-CM

## 2021-07-25 DIAGNOSIS — Z981 Arthrodesis status: Secondary | ICD-10-CM | POA: Diagnosis not present

## 2021-07-25 DIAGNOSIS — R739 Hyperglycemia, unspecified: Secondary | ICD-10-CM | POA: Diagnosis not present

## 2021-07-25 DIAGNOSIS — J90 Pleural effusion, not elsewhere classified: Secondary | ICD-10-CM | POA: Diagnosis not present

## 2021-07-25 DIAGNOSIS — J9690 Respiratory failure, unspecified, unspecified whether with hypoxia or hypercapnia: Secondary | ICD-10-CM

## 2021-07-25 DIAGNOSIS — E039 Hypothyroidism, unspecified: Secondary | ICD-10-CM | POA: Diagnosis present

## 2021-07-25 DIAGNOSIS — R338 Other retention of urine: Secondary | ICD-10-CM | POA: Diagnosis present

## 2021-07-25 DIAGNOSIS — Z7982 Long term (current) use of aspirin: Secondary | ICD-10-CM

## 2021-07-25 DIAGNOSIS — K2970 Gastritis, unspecified, without bleeding: Secondary | ICD-10-CM | POA: Diagnosis present

## 2021-07-25 DIAGNOSIS — W19XXXA Unspecified fall, initial encounter: Secondary | ICD-10-CM

## 2021-07-25 DIAGNOSIS — R6520 Severe sepsis without septic shock: Secondary | ICD-10-CM | POA: Diagnosis present

## 2021-07-25 DIAGNOSIS — K838 Other specified diseases of biliary tract: Secondary | ICD-10-CM | POA: Diagnosis not present

## 2021-07-25 DIAGNOSIS — K769 Liver disease, unspecified: Secondary | ICD-10-CM | POA: Diagnosis not present

## 2021-07-25 DIAGNOSIS — Z833 Family history of diabetes mellitus: Secondary | ICD-10-CM

## 2021-07-25 DIAGNOSIS — E162 Hypoglycemia, unspecified: Secondary | ICD-10-CM | POA: Diagnosis not present

## 2021-07-25 DIAGNOSIS — E876 Hypokalemia: Secondary | ICD-10-CM | POA: Diagnosis present

## 2021-07-25 DIAGNOSIS — I6523 Occlusion and stenosis of bilateral carotid arteries: Secondary | ICD-10-CM | POA: Diagnosis not present

## 2021-07-25 DIAGNOSIS — Z6829 Body mass index (BMI) 29.0-29.9, adult: Secondary | ICD-10-CM

## 2021-07-25 DIAGNOSIS — K219 Gastro-esophageal reflux disease without esophagitis: Secondary | ICD-10-CM | POA: Diagnosis present

## 2021-07-25 DIAGNOSIS — G9341 Metabolic encephalopathy: Secondary | ICD-10-CM | POA: Diagnosis present

## 2021-07-25 DIAGNOSIS — K9189 Other postprocedural complications and disorders of digestive system: Secondary | ICD-10-CM | POA: Diagnosis not present

## 2021-07-25 DIAGNOSIS — K750 Abscess of liver: Secondary | ICD-10-CM | POA: Diagnosis present

## 2021-07-25 DIAGNOSIS — E8770 Fluid overload, unspecified: Secondary | ICD-10-CM | POA: Diagnosis present

## 2021-07-25 DIAGNOSIS — R509 Fever, unspecified: Secondary | ICD-10-CM | POA: Diagnosis not present

## 2021-07-25 DIAGNOSIS — J811 Chronic pulmonary edema: Secondary | ICD-10-CM | POA: Diagnosis not present

## 2021-07-25 DIAGNOSIS — Z9841 Cataract extraction status, right eye: Secondary | ICD-10-CM

## 2021-07-25 DIAGNOSIS — K255 Chronic or unspecified gastric ulcer with perforation: Secondary | ICD-10-CM | POA: Diagnosis present

## 2021-07-25 DIAGNOSIS — E161 Other hypoglycemia: Secondary | ICD-10-CM | POA: Diagnosis not present

## 2021-07-25 DIAGNOSIS — B952 Enterococcus as the cause of diseases classified elsewhere: Secondary | ICD-10-CM | POA: Diagnosis present

## 2021-07-25 DIAGNOSIS — N401 Enlarged prostate with lower urinary tract symptoms: Secondary | ICD-10-CM | POA: Diagnosis present

## 2021-07-25 DIAGNOSIS — E440 Moderate protein-calorie malnutrition: Secondary | ICD-10-CM | POA: Diagnosis present

## 2021-07-25 DIAGNOSIS — Z452 Encounter for adjustment and management of vascular access device: Secondary | ICD-10-CM | POA: Diagnosis not present

## 2021-07-25 DIAGNOSIS — Z7951 Long term (current) use of inhaled steroids: Secondary | ICD-10-CM

## 2021-07-25 DIAGNOSIS — J9601 Acute respiratory failure with hypoxia: Secondary | ICD-10-CM | POA: Diagnosis present

## 2021-07-25 DIAGNOSIS — Z794 Long term (current) use of insulin: Secondary | ICD-10-CM

## 2021-07-25 DIAGNOSIS — Z20822 Contact with and (suspected) exposure to covid-19: Secondary | ICD-10-CM | POA: Diagnosis present

## 2021-07-25 DIAGNOSIS — R197 Diarrhea, unspecified: Secondary | ICD-10-CM | POA: Diagnosis not present

## 2021-07-25 DIAGNOSIS — H7093 Unspecified mastoiditis, bilateral: Secondary | ICD-10-CM | POA: Diagnosis not present

## 2021-07-25 DIAGNOSIS — S199XXA Unspecified injury of neck, initial encounter: Secondary | ICD-10-CM | POA: Diagnosis not present

## 2021-07-25 DIAGNOSIS — I517 Cardiomegaly: Secondary | ICD-10-CM | POA: Diagnosis not present

## 2021-07-25 DIAGNOSIS — K7581 Nonalcoholic steatohepatitis (NASH): Secondary | ICD-10-CM | POA: Diagnosis present

## 2021-07-25 DIAGNOSIS — J302 Other seasonal allergic rhinitis: Secondary | ICD-10-CM | POA: Diagnosis not present

## 2021-07-25 DIAGNOSIS — N3289 Other specified disorders of bladder: Secondary | ICD-10-CM | POA: Diagnosis not present

## 2021-07-25 DIAGNOSIS — Z8249 Family history of ischemic heart disease and other diseases of the circulatory system: Secondary | ICD-10-CM

## 2021-07-25 DIAGNOSIS — I160 Hypertensive urgency: Secondary | ICD-10-CM | POA: Diagnosis not present

## 2021-07-25 DIAGNOSIS — I808 Phlebitis and thrombophlebitis of other sites: Secondary | ICD-10-CM | POA: Diagnosis present

## 2021-07-25 DIAGNOSIS — K8020 Calculus of gallbladder without cholecystitis without obstruction: Secondary | ICD-10-CM | POA: Diagnosis present

## 2021-07-25 DIAGNOSIS — R109 Unspecified abdominal pain: Secondary | ICD-10-CM | POA: Diagnosis not present

## 2021-07-25 DIAGNOSIS — Z781 Physical restraint status: Secondary | ICD-10-CM

## 2021-07-25 DIAGNOSIS — K6389 Other specified diseases of intestine: Secondary | ICD-10-CM | POA: Diagnosis not present

## 2021-07-25 DIAGNOSIS — Z0189 Encounter for other specified special examinations: Secondary | ICD-10-CM

## 2021-07-25 DIAGNOSIS — S0990XA Unspecified injury of head, initial encounter: Secondary | ICD-10-CM | POA: Diagnosis not present

## 2021-07-25 DIAGNOSIS — K251 Acute gastric ulcer with perforation: Secondary | ICD-10-CM | POA: Diagnosis not present

## 2021-07-25 DIAGNOSIS — F0393 Unspecified dementia, unspecified severity, with mood disturbance: Secondary | ICD-10-CM | POA: Diagnosis present

## 2021-07-25 DIAGNOSIS — R410 Disorientation, unspecified: Secondary | ICD-10-CM | POA: Diagnosis not present

## 2021-07-25 DIAGNOSIS — N400 Enlarged prostate without lower urinary tract symptoms: Secondary | ICD-10-CM | POA: Diagnosis not present

## 2021-07-25 DIAGNOSIS — K261 Acute duodenal ulcer with perforation: Secondary | ICD-10-CM | POA: Diagnosis not present

## 2021-07-25 DIAGNOSIS — Z5189 Encounter for other specified aftercare: Secondary | ICD-10-CM | POA: Diagnosis not present

## 2021-07-25 DIAGNOSIS — Z720 Tobacco use: Secondary | ICD-10-CM

## 2021-07-25 DIAGNOSIS — E119 Type 2 diabetes mellitus without complications: Secondary | ICD-10-CM

## 2021-07-25 DIAGNOSIS — Z4659 Encounter for fitting and adjustment of other gastrointestinal appliance and device: Secondary | ICD-10-CM

## 2021-07-25 DIAGNOSIS — R069 Unspecified abnormalities of breathing: Secondary | ICD-10-CM

## 2021-07-25 DIAGNOSIS — G3184 Mild cognitive impairment, so stated: Secondary | ICD-10-CM | POA: Diagnosis present

## 2021-07-25 HISTORY — PX: LAPAROTOMY: SHX154

## 2021-07-25 LAB — PROTIME-INR
INR: 1.3 — ABNORMAL HIGH (ref 0.8–1.2)
Prothrombin Time: 15.9 seconds — ABNORMAL HIGH (ref 11.4–15.2)

## 2021-07-25 LAB — COMPREHENSIVE METABOLIC PANEL
ALT: 246 U/L — ABNORMAL HIGH (ref 0–44)
AST: 103 U/L — ABNORMAL HIGH (ref 15–41)
Albumin: 1.6 g/dL — ABNORMAL LOW (ref 3.5–5.0)
Alkaline Phosphatase: 206 U/L — ABNORMAL HIGH (ref 38–126)
Anion gap: 15 (ref 5–15)
BUN: 41 mg/dL — ABNORMAL HIGH (ref 8–23)
CO2: 21 mmol/L — ABNORMAL LOW (ref 22–32)
Calcium: 9.1 mg/dL (ref 8.9–10.3)
Chloride: 100 mmol/L (ref 98–111)
Creatinine, Ser: 1.16 mg/dL (ref 0.61–1.24)
GFR, Estimated: >60 mL/min (ref >60)
Glucose, Bld: 385 mg/dL — ABNORMAL HIGH (ref 70–99)
Potassium: 3.8 mmol/L (ref 3.5–5.1)
Sodium: 136 mmol/L (ref 135–145)
Total Bilirubin: 3.6 mg/dL — ABNORMAL HIGH (ref 0.3–1.2)
Total Protein: 5.7 g/dL — ABNORMAL LOW (ref 6.5–8.1)

## 2021-07-25 LAB — RESP PANEL BY RT-PCR (FLU A&B, COVID) ARPGX2
Influenza A by PCR: NEGATIVE
Influenza B by PCR: NEGATIVE
SARS Coronavirus 2 by RT PCR: NEGATIVE

## 2021-07-25 LAB — GLUCOSE, CAPILLARY
Glucose-Capillary: 332 mg/dL — ABNORMAL HIGH (ref 70–99)
Glucose-Capillary: 301 mg/dL — ABNORMAL HIGH (ref 70–99)

## 2021-07-25 LAB — LIPASE, BLOOD: Lipase: 30 U/L (ref 11–51)

## 2021-07-25 LAB — CBG MONITORING, ED: Glucose-Capillary: 346 mg/dL — ABNORMAL HIGH (ref 70–99)

## 2021-07-25 LAB — CK: Total CK: 406 U/L — ABNORMAL HIGH (ref 49–397)

## 2021-07-25 LAB — CBC WITH DIFFERENTIAL/PLATELET
Abs Immature Granulocytes: 0.31 10*3/uL — ABNORMAL HIGH (ref 0.00–0.07)
Basophils Absolute: 0.1 10*3/uL (ref 0.0–0.1)
Basophils Relative: 1 %
Eosinophils Absolute: 0 10*3/uL (ref 0.0–0.5)
Eosinophils Relative: 0 %
HCT: 44.2 % (ref 39.0–52.0)
Hemoglobin: 14.9 g/dL (ref 13.0–17.0)
Immature Granulocytes: 1 %
Lymphocytes Relative: 5 %
Lymphs Abs: 1.3 10*3/uL (ref 0.7–4.0)
MCH: 30.7 pg (ref 26.0–34.0)
MCHC: 33.7 g/dL (ref 30.0–36.0)
MCV: 91.1 fL (ref 80.0–100.0)
Monocytes Absolute: 0.9 10*3/uL (ref 0.1–1.0)
Monocytes Relative: 3 %
Neutro Abs: 23.1 10*3/uL — ABNORMAL HIGH (ref 1.7–7.7)
Neutrophils Relative %: 90 %
Platelets: 95 10*3/uL — ABNORMAL LOW (ref 150–400)
RBC: 4.85 MIL/uL (ref 4.22–5.81)
RDW: 14.3 % (ref 11.5–15.5)
WBC: 25.7 10*3/uL — ABNORMAL HIGH (ref 4.0–10.5)
nRBC: 0 % (ref 0.0–0.2)

## 2021-07-25 LAB — ETHANOL: Alcohol, Ethyl (B): <10 mg/dL (ref <10)

## 2021-07-25 LAB — ABO/RH: ABO/RH(D): A POS

## 2021-07-25 LAB — TROPONIN I (HIGH SENSITIVITY)
Troponin I (High Sensitivity): 94 ng/L — ABNORMAL HIGH (ref <18)
Troponin I (High Sensitivity): 112 ng/L (ref <18)

## 2021-07-25 LAB — LACTIC ACID, PLASMA
Lactic Acid, Venous: 1.9 mmol/L (ref 0.5–1.9)
Lactic Acid, Venous: 1.6 mmol/L (ref 0.5–1.9)

## 2021-07-25 LAB — TYPE AND SCREEN
ABO/RH(D): A POS
Antibody Screen: NEGATIVE

## 2021-07-25 LAB — AMMONIA: Ammonia: 20 umol/L (ref 9–35)

## 2021-07-25 LAB — APTT: aPTT: 26 seconds (ref 24–36)

## 2021-07-25 SURGERY — LAPAROTOMY, EXPLORATORY
Anesthesia: General

## 2021-07-25 MED ORDER — LACTATED RINGERS IV SOLN: INTRAVENOUS | Status: DC

## 2021-07-25 MED ORDER — PROPOFOL 10 MG/ML IV BOLUS
INTRAVENOUS | Status: DC | PRN
  Administered 2021-07-25: 50 mg via INTRAVENOUS

## 2021-07-25 MED ORDER — PHENYLEPHRINE HCL-NACL 20-0.9 MG/250ML-% IV SOLN
INTRAVENOUS | Status: DC | PRN
  Administered 2021-07-25: 20 ug/min via INTRAVENOUS

## 2021-07-25 MED ORDER — SODIUM BICARBONATE 8.4 % IV SOLN
INTRAVENOUS | Status: DC | PRN
  Administered 2021-07-25: 50 meq via INTRAVENOUS

## 2021-07-25 MED ORDER — PROPOFOL 10 MG/ML IV BOLUS
INTRAVENOUS | Status: AC
  Filled 2021-07-25: qty 20

## 2021-07-25 MED ORDER — SODIUM BICARBONATE 8.4 % IV SOLN
INTRAVENOUS | Status: AC
  Filled 2021-07-25: qty 50

## 2021-07-25 MED ORDER — 0.9 % SODIUM CHLORIDE (POUR BTL) OPTIME
TOPICAL | Status: DC | PRN
  Administered 2021-07-25: 2000 mL
  Administered 2021-07-25: 1000 mL

## 2021-07-25 MED ORDER — PHENYLEPHRINE HCL (PRESSORS) 10 MG/ML IV SOLN
INTRAVENOUS | Status: AC
  Filled 2021-07-25: qty 1

## 2021-07-25 MED ORDER — ACETAMINOPHEN 10 MG/ML IV SOLN: 1000.0000 mg | Freq: Once | INTRAVENOUS | Status: DC | PRN

## 2021-07-25 MED ORDER — ROCURONIUM BROMIDE 10 MG/ML (PF) SYRINGE
PREFILLED_SYRINGE | INTRAVENOUS | Status: DC | PRN
  Administered 2021-07-25: 30 mg via INTRAVENOUS
  Administered 2021-07-25: 50 mg via INTRAVENOUS

## 2021-07-25 MED ORDER — SUCCINYLCHOLINE CHLORIDE 200 MG/10ML IV SOSY
PREFILLED_SYRINGE | INTRAVENOUS | Status: AC
  Filled 2021-07-25: qty 10

## 2021-07-25 MED ORDER — SODIUM CHLORIDE 0.9 % IV BOLUS
1000.0000 mL | Freq: Once | INTRAVENOUS | Status: AC
  Administered 2021-07-25: 1000 mL via INTRAVENOUS

## 2021-07-25 MED ORDER — LACTATED RINGERS IV SOLN: INTRAVENOUS | Status: DC | PRN

## 2021-07-25 MED ORDER — ONDANSETRON HCL 4 MG/2ML IJ SOLN
INTRAMUSCULAR | Status: AC
  Filled 2021-07-25: qty 2

## 2021-07-25 MED ORDER — METOPROLOL TARTRATE 5 MG/5ML IV SOLN
INTRAVENOUS | Status: AC
  Filled 2021-07-25: qty 5

## 2021-07-25 MED ORDER — METRONIDAZOLE 500 MG/100ML IV SOLN
500.0000 mg | Freq: Once | INTRAVENOUS | Status: AC
  Administered 2021-07-25: 500 mg via INTRAVENOUS
  Filled 2021-07-25 (×2): qty 100

## 2021-07-25 MED ORDER — FENTANYL CITRATE (PF) 250 MCG/5ML IJ SOLN
INTRAMUSCULAR | Status: AC
  Filled 2021-07-25: qty 5

## 2021-07-25 MED ORDER — ALBUMIN HUMAN 5 % IV SOLN: INTRAVENOUS | Status: DC | PRN

## 2021-07-25 MED ORDER — ONDANSETRON HCL 4 MG/2ML IJ SOLN: 4.0000 mg | Freq: Once | INTRAMUSCULAR | Status: DC | PRN

## 2021-07-25 MED ORDER — INSULIN ASPART 100 UNIT/ML IJ SOLN
INTRAMUSCULAR | Status: DC | PRN
  Administered 2021-07-25 (×2): 5 [IU] via SUBCUTANEOUS

## 2021-07-25 MED ORDER — ESMOLOL HCL 100 MG/10ML IV SOLN
INTRAVENOUS | Status: DC | PRN
  Administered 2021-07-25 (×5): 20 mg via INTRAVENOUS

## 2021-07-25 MED ORDER — INSULIN ASPART 100 UNIT/ML IJ SOLN
INTRAMUSCULAR | Status: AC
  Filled 2021-07-25: qty 1

## 2021-07-25 MED ORDER — LIDOCAINE 2% (20 MG/ML) 5 ML SYRINGE
INTRAMUSCULAR | Status: DC | PRN
  Administered 2021-07-25: 60 mg via INTRAVENOUS

## 2021-07-25 MED ORDER — METOPROLOL TARTRATE 5 MG/5ML IV SOLN
INTRAVENOUS | Status: DC | PRN
  Administered 2021-07-25 (×2): 1 mg via INTRAVENOUS

## 2021-07-25 MED ORDER — PHENYLEPHRINE 40 MCG/ML (10ML) SYRINGE FOR IV PUSH (FOR BLOOD PRESSURE SUPPORT)
PREFILLED_SYRINGE | INTRAVENOUS | Status: DC | PRN
  Administered 2021-07-25: 120 ug via INTRAVENOUS
  Administered 2021-07-25 (×2): 80 ug via INTRAVENOUS

## 2021-07-25 MED ORDER — ESMOLOL HCL 100 MG/10ML IV SOLN
INTRAVENOUS | Status: AC
  Filled 2021-07-25: qty 10

## 2021-07-25 MED ORDER — SODIUM CHLORIDE 0.9 % IV SOLN
2.0000 g | Freq: Once | INTRAVENOUS | Status: AC
  Administered 2021-07-25: 2 g via INTRAVENOUS
  Filled 2021-07-25: qty 2

## 2021-07-25 MED ORDER — DEXTROSE 50 % IV SOLN: 0.0000 mL | INTRAVENOUS | Status: DC | PRN

## 2021-07-25 MED ORDER — PROPOFOL 500 MG/50ML IV EMUL
INTRAVENOUS | Status: DC | PRN
  Administered 2021-07-25: 75 ug/kg/min via INTRAVENOUS

## 2021-07-25 MED ORDER — IOHEXOL 300 MG/ML  SOLN
100.0000 mL | Freq: Once | INTRAMUSCULAR | Status: AC | PRN
  Administered 2021-07-25: 100 mL via INTRAVENOUS

## 2021-07-25 MED ORDER — AMISULPRIDE (ANTIEMETIC) 5 MG/2ML IV SOLN: 10.0000 mg | Freq: Once | INTRAVENOUS | Status: DC | PRN

## 2021-07-25 MED ORDER — SUCCINYLCHOLINE CHLORIDE 200 MG/10ML IV SOSY
PREFILLED_SYRINGE | INTRAVENOUS | Status: DC | PRN
  Administered 2021-07-25: 80 mg via INTRAVENOUS

## 2021-07-25 MED ORDER — FENTANYL CITRATE (PF) 100 MCG/2ML IJ SOLN
25.0000 ug | INTRAMUSCULAR | Status: DC | PRN
Start: 2021-07-25 — End: 2021-07-26

## 2021-07-25 MED ORDER — FENTANYL CITRATE (PF) 250 MCG/5ML IJ SOLN
INTRAMUSCULAR | Status: DC | PRN
  Administered 2021-07-25: 150 ug via INTRAVENOUS
  Administered 2021-07-25 (×2): 50 ug via INTRAVENOUS

## 2021-07-25 MED ORDER — VASOPRESSIN 20 UNIT/ML IV SOLN
INTRAVENOUS | Status: AC
  Filled 2021-07-25: qty 1

## 2021-07-25 MED ORDER — VASOPRESSIN 20 UNIT/ML IV SOLN
INTRAVENOUS | Status: DC | PRN
  Administered 2021-07-25 (×2): 1 [IU] via INTRAVENOUS

## 2021-07-25 MED ORDER — ROCURONIUM BROMIDE 10 MG/ML (PF) SYRINGE
PREFILLED_SYRINGE | INTRAVENOUS | Status: AC
  Filled 2021-07-25: qty 10

## 2021-07-25 MED ORDER — SODIUM CHLORIDE 0.9 % IV SOLN
2.0000 g | Freq: Two times a day (BID) | INTRAVENOUS | Status: DC
  Filled 2021-07-25: qty 2

## 2021-07-25 MED ORDER — INSULIN REGULAR(HUMAN) IN NACL 100-0.9 UT/100ML-% IV SOLN: INTRAVENOUS | Status: DC

## 2021-07-25 SURGICAL SUPPLY — 42 items
BAG COUNTER SPONGE SURGICOUNT (BAG) ×4 IMPLANT
BLADE CLIPPER SURG (BLADE) ×2 IMPLANT
BNDG GAUZE ELAST 4 BULKY (GAUZE/BANDAGES/DRESSINGS) ×2 IMPLANT
CANISTER SUCT 3000ML PPV (MISCELLANEOUS) ×2 IMPLANT
CHLORAPREP W/TINT 26 (MISCELLANEOUS) ×2 IMPLANT
COVER SURGICAL LIGHT HANDLE (MISCELLANEOUS) ×2 IMPLANT
DRAIN CHANNEL 19F RND (DRAIN) ×4 IMPLANT
DRAPE LAPAROSCOPIC ABDOMINAL (DRAPES) ×2 IMPLANT
DRAPE WARM FLUID 44X44 (DRAPES) ×2 IMPLANT
DRSG PAD ABDOMINAL 8X10 ST (GAUZE/BANDAGES/DRESSINGS) ×4 IMPLANT
ELECT BLADE 6.5 EXT (BLADE) ×2 IMPLANT
ELECT REM PT RETURN 9FT ADLT (ELECTROSURGICAL) ×2
ELECTRODE REM PT RTRN 9FT ADLT (ELECTROSURGICAL) ×1 IMPLANT
EVACUATOR SILICONE 100CC (DRAIN) ×4 IMPLANT
GLOVE SURG ENC MOIS LTX SZ7.5 (GLOVE) ×2 IMPLANT
GLOVE SURG UNDER LTX SZ8 (GLOVE) ×2 IMPLANT
GOWN STRL REUS W/ TWL LRG LVL3 (GOWN DISPOSABLE) ×1 IMPLANT
GOWN STRL REUS W/ TWL XL LVL3 (GOWN DISPOSABLE) ×1 IMPLANT
GOWN STRL REUS W/TWL LRG LVL3 (GOWN DISPOSABLE) ×2
GOWN STRL REUS W/TWL XL LVL3 (GOWN DISPOSABLE) ×2
HANDLE SUCTION POOLE (INSTRUMENTS) ×1 IMPLANT
KIT BASIN OR (CUSTOM PROCEDURE TRAY) ×2 IMPLANT
KIT TURNOVER KIT B (KITS) ×2 IMPLANT
LIGASURE IMPACT 36 18CM CVD LR (INSTRUMENTS) ×2 IMPLANT
NS IRRIG 1000ML POUR BTL (IV SOLUTION) ×4 IMPLANT
PACK GENERAL/GYN (CUSTOM PROCEDURE TRAY) ×2 IMPLANT
PAD ARMBOARD 7.5X6 YLW CONV (MISCELLANEOUS) ×2 IMPLANT
PENCIL SMOKE EVACUATOR (MISCELLANEOUS) ×2 IMPLANT
SPECIMEN JAR LARGE (MISCELLANEOUS) IMPLANT
SPONGE T-LAP 18X18 ~~LOC~~+RFID (SPONGE) ×6 IMPLANT
STAPLER VISISTAT 35W (STAPLE) IMPLANT
SUCTION POOLE HANDLE (INSTRUMENTS) ×2
SUT ETHILON 2 0 FS 18 (SUTURE) ×4 IMPLANT
SUT PDS AB 1 TP1 96 (SUTURE) ×4 IMPLANT
SUT SILK 2 0 SH CR/8 (SUTURE) ×2 IMPLANT
SUT SILK 2 0 TIES 10X30 (SUTURE) ×2 IMPLANT
SUT SILK 3 0 SH CR/8 (SUTURE) ×2 IMPLANT
SUT SILK 3 0 TIES 10X30 (SUTURE) ×2 IMPLANT
SYR TOOMEY 50ML (SYRINGE) ×2 IMPLANT
TOWEL GREEN STERILE (TOWEL DISPOSABLE) ×2 IMPLANT
TRAY FOLEY MTR SLVR 16FR STAT (SET/KITS/TRAYS/PACK) ×2 IMPLANT
YANKAUER SUCT BULB TIP NO VENT (SUCTIONS) ×2 IMPLANT
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
## 2021-07-25 NOTE — Op Note (Signed)
07/25/2021  11:46 PM  PATIENT:  Matthew Dominguez  70 y.o. male  Patient Care Team: Ngetich, Matthew Bucks, NP as PCP - General (Family Medicine) Matthew Dominguez, OD as Referring Physician (Ophthalmology)  PRE-OPERATIVE DIAGNOSIS:  Perforated hollow viscus  POST-OPERATIVE DIAGNOSIS: Ruptured pyogenic liver abscesses  PROCEDURE:  Exploratory laparotomy with drainage of pyogenic liver abscess x 2 Partial omentectomy  SURGEON:  Sharon Mt. Karlis Cregg, MD  ASSISTANT: OR staff  ANESTHESIA:   general  COUNTS:  Sponge, needle and instrument counts were reported correct x2 at the conclusion of the operation.  EBL: 100 mL  DRAINS: Two 66 Fr round blake drains were left draining the liver abscess cavities  COMPLICATIONS: None  FINDINGS: Normal stomach, pylorus, duodenum.  Copious clear/yellow bile admixed with pus throughout the upper abdomen.  Ultimately, it was apparent that he had a pyogenic liver abscess in the left lobe of his liver that had partially ruptured and was the source of both his free fluid and free air.  DISPOSITION: ICU in critical but stable condition.  DESCRIPTION: The patient was identified in preop holding and taken to the OR where he was placed on the operating room table. SCDs were placed. General endotracheal anesthesia was induced without difficulty.  Hair on the abdomen was clipped.  A Foley catheter was placed by the OR team.  Pressure points were padded. He was then prepped and draped in the usual sterile fashion. A surgical timeout was performed indicating the correct patient, procedure, positioning and need for preoperative antibiotics.   An NG tube had been placed by anesthesia.  An upper midline incision was created.  The fascia was incised the midline.  The peritoneal cavity was carefully entered sharply.  Copious amounts of clear screen bile admixed with pus freely flowed.  Approximately 200 cc was evacuated with suction.  A Bookwalter retractor was placed.  We  began by exploring the upper abdomen.  The stomach was palpated.  There is no clearly palpable mass or significant thickening per se.  There is chronic appearing fibrinous rind throughout much of his upper abdomen.  This appears to have been going on for some time.  The anterior wall of the stomach is normal.  The pylorus is normal.  The lesser sac is explored by opening the omentum just distal to the stomach.  The posterior wall of the stomach is palpated and normal.  There is no evident perforation at this location either.  Adhesions of the gallbladder to the pylorus are carefully divided sharply.  There are gallstones palpable within the lumen of the gallbladder but the gallbladder is grossly normal.  The first portion of the duodenum was normal.  We are able to kocherized the duodenum and bring much of the duodenum up into the abdomen.  The duodenum is palpated and soft.  There is no evident perforation.  With the NG tube within the body of the stomach, we had anesthesia fill the stomach with air.  The upper abdomen was submerged with saline.  There are no bubbles.  The irrigation is evacuated.  The liver was then carefully inspected.  On the anterior surface of the left lobe of the liver, there are multiple pockets that are now freely draining pus.  At this point, it became clear that the process that had likely occurred as a chronic/pyogenic liver abscess having ruptured into the peritoneal cavity.  There was some bile leaking from his liver.  There is toothpaste consistency Necha Harries sludge/pus that had been  evacuated when we irrigated.  There is no evident pus remaining for cultures.  I am able to palpate the liver abscesses with my finger.  These are large.  There are multiple within the left lobe of his liver.  The 2 dominant ones were unroofed with electrocautery.  The cavities were inspected and hemostasis is present.  2 additional liters of irrigation were used to rinse the upper abdomen.  Hemostasis is  appreciated.  The omentum was inspected.  There was 1 area of questionable viability.  This is excised with the LigaSure and passed off as specimen.  The transverse colon is normal in appearance.  I am able to palpate the sigmoid colon.  There are no significant areas of thickening.  The small bowel was evaluated.  There are no abnormalities apparent.  The NG tube is in appropriate position.  Two 19 French round Blake drains were brought onto the field.  These were both placed through stab incisions on each side of the abdomen.  These were brought up one on the anterior portion of the liver and the other draining posteriorly.  Omentum was then reflected up over the abscess cavities and covering the drains.  The fascia was then closed with 2 running #1 PDS sutures tied centrally.  All counts were reported correct x2.  The skin is intense left open given the significant contamination of the procedure.  A moist Kerlix was placed over the skin.  This is covered additional dry dressing and secured with tape.  JPs were placed to bulb suction.  He was transferred to an ICU bed for transport to the intensive care unit in critical but stable condition.  I have communicated the pertinent findings and his course thus far with our CCM team.

## 2021-07-25 NOTE — ED Triage Notes (Signed)
Pt found down in bathroom at home, confused but responding to questions. CBG reading low, EMS gave 20 gm's of d 10 and CBG reading 350 s/p

## 2021-07-25 NOTE — Anesthesia Procedure Notes (Signed)
Arterial Line Insertion Start/End11/18/2022 11:15 PM, 07/25/2021 11:25 PM Performed by: Josephine Igo, CRNA, CRNA  Preanesthetic checklist: patient identified, IV checked, site marked and risks and benefits discussed Left, radial was placed Catheter size: 20 G Hand hygiene performed   Attempts: 1 Procedure performed without using ultrasound guided technique. Following insertion, Biopatch and dressing applied. Post procedure assessment: normal

## 2021-07-25 NOTE — ED Provider Notes (Signed)
Emergency Medicine Provider Triage Evaluation Note  Matthew Dominguez , a 70 y.o. male  was evaluated in triage.  Pt complains of weakness found down on the floor.  Review of Systems  Positive: Generalized weakness Negative: Chest pain  Physical Exam  There were no vitals taken for this visit. Gen:   Awake, no distress    Resp:  Normal effort  MSK:   Moves extremities without difficulty  Other:    Medical Decision Making  Medically screening exam initiated at 3:27 PM.  Appropriate orders placed.  Rodrigo Ran was informed that the remainder of the evaluation will be completed by another provider, this initial triage assessment does not replace that evaluation, and the importance of remaining in the ED until their evaluation is complete.     Hayden Rasmussen, MD 07/25/21 Carollee Massed

## 2021-07-25 NOTE — Anesthesia Preprocedure Evaluation (Addendum)
Anesthesia Evaluation  Patient identified by MRN, date of birth, ID band Patient confused    Reviewed: Allergy & Precautions, NPO status , Patient's Chart, lab work & pertinent test results  Airway Mallampati: III  TM Distance: >3 FB Neck ROM: Full    Dental  (+) Edentulous Upper, Edentulous Lower   Pulmonary asthma , COPD, former smoker,    Pulmonary exam normal breath sounds clear to auscultation       Cardiovascular hypertension, Pt. on home beta blockers and Pt. on medications + dysrhythmias  Rhythm:Regular Rate:Tachycardia  ECG: ST, rate 107. Sinus tachycardia RBBB and LAFB Left ventricular hypertrophy   Neuro/Psych PSYCHIATRIC DISORDERS Anxiety Depression Mild cognitive impairment    GI/Hepatic Neg liver ROS, GERD  Medicated,  Endo/Other  diabetes, Insulin Dependent, Oral Hypoglycemic Agents  Renal/GU negative Renal ROS     Musculoskeletal  (+) Arthritis ,   Abdominal   Peds  Hematology  (+) Blood dyscrasia, , Thrombocytopenia   Anesthesia Other Findings Perforated ulcer  Reproductive/Obstetrics                            Anesthesia Physical Anesthesia Plan  ASA: 4 and emergent  Anesthesia Plan: General   Post-op Pain Management:    Induction: Rapid sequence and Intravenous  PONV Risk Score and Plan: 2 and Ondansetron, Dexamethasone and Treatment may vary due to age or medical condition  Airway Management Planned: Oral ETT  Additional Equipment: Arterial line  Intra-op Plan:   Post-operative Plan: Possible Post-op intubation/ventilation  Informed Consent: I have reviewed the patients History and Physical, chart, labs and discussed the procedure including the risks, benefits and alternatives for the proposed anesthesia with the patient or authorized representative who has indicated his/her understanding and acceptance.     Consent reviewed with POA  Plan Discussed  with: CRNA  Anesthesia Plan Comments: (Anesthetic plan discussed with daughter)      Anesthesia Quick Evaluation
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
## 2021-07-25 NOTE — Progress Notes (Signed)
Pharmacy Antibiotic Note  Matthew Dominguez is a 70 y.o. male admitted on 07/25/2021 after being found down at home and found to have intra-abdominal.  WBC 25.7 and afebrile. Started on flagyl 500mg  q12h and cefepime in the ED. Pharmacy has been consulted for cefepime dosing.  Plan: Cefepime 2g q12h F/u renal function and adjust as needed  Height: 5\' 7"  (170.2 cm) Weight: 84 kg (185 lb 3 oz) IBW/kg (Calculated) : 66.1  Temp (24hrs), Avg:98.8 F (37.1 C), Min:98.8 F (37.1 C), Max:98.8 F (37.1 C)  Recent Labs  Lab 07/25/21 1527 07/25/21 1530  WBC 25.7*  --   CREATININE 1.16  --   LATICACIDVEN  --  1.9    Estimated Creatinine Clearance: 59.7 mL/min (by C-G formula based on SCr of 1.16 mg/dL).    No Known Allergies  Antimicrobials this admission: Cefepime 111/18 > Flagyl 11/18 >  Dose adjustments this admission:   Microbiology results:   Thank you for allowing pharmacy to be a part of this patient's care.  Levonne Spiller 07/25/2021 6:47 PM

## 2021-07-25 NOTE — Anesthesia Procedure Notes (Signed)
Procedure Name: Intubation Date/Time: 07/25/2021 9:35 PM Performed by: Thelma Comp, CRNA Pre-anesthesia Checklist: Patient identified, Emergency Drugs available, Suction available and Patient being monitored Patient Re-evaluated:Patient Re-evaluated prior to induction Oxygen Delivery Method: Circle System Utilized Preoxygenation: Pre-oxygenation with 100% oxygen Induction Type: IV induction, Rapid sequence and Cricoid Pressure applied Ventilation: Mask ventilation without difficulty Laryngoscope Size: Mac and 3 Grade View: Grade I Tube type: Oral Tube size: 7.5 mm Number of attempts: 1 Airway Equipment and Method: Stylet Placement Confirmation: ETT inserted through vocal cords under direct vision, positive ETCO2 and breath sounds checked- equal and bilateral Secured at: 23 cm Tube secured with: Tape Dental Injury: Teeth and Oropharynx as per pre-operative assessment

## 2021-07-26 ENCOUNTER — Inpatient Hospital Stay (HOSPITAL_COMMUNITY): Payer: Medicare HMO

## 2021-07-26 DIAGNOSIS — E11649 Type 2 diabetes mellitus with hypoglycemia without coma: Secondary | ICD-10-CM | POA: Diagnosis not present

## 2021-07-26 DIAGNOSIS — K769 Liver disease, unspecified: Secondary | ICD-10-CM | POA: Diagnosis not present

## 2021-07-26 DIAGNOSIS — K838 Other specified diseases of biliary tract: Secondary | ICD-10-CM | POA: Diagnosis not present

## 2021-07-26 DIAGNOSIS — R0602 Shortness of breath: Secondary | ICD-10-CM | POA: Diagnosis not present

## 2021-07-26 DIAGNOSIS — K6389 Other specified diseases of intestine: Secondary | ICD-10-CM | POA: Diagnosis not present

## 2021-07-26 DIAGNOSIS — J9621 Acute and chronic respiratory failure with hypoxia: Secondary | ICD-10-CM | POA: Diagnosis not present

## 2021-07-26 DIAGNOSIS — K8020 Calculus of gallbladder without cholecystitis without obstruction: Secondary | ICD-10-CM | POA: Diagnosis not present

## 2021-07-26 DIAGNOSIS — F03918 Unspecified dementia, unspecified severity, with other behavioral disturbance: Secondary | ICD-10-CM | POA: Diagnosis not present

## 2021-07-26 DIAGNOSIS — R109 Unspecified abdominal pain: Secondary | ICD-10-CM | POA: Diagnosis not present

## 2021-07-26 DIAGNOSIS — E039 Hypothyroidism, unspecified: Secondary | ICD-10-CM | POA: Diagnosis present

## 2021-07-26 DIAGNOSIS — K7589 Other specified inflammatory liver diseases: Secondary | ICD-10-CM | POA: Diagnosis present

## 2021-07-26 DIAGNOSIS — Z794 Long term (current) use of insulin: Secondary | ICD-10-CM

## 2021-07-26 DIAGNOSIS — G9341 Metabolic encephalopathy: Secondary | ICD-10-CM | POA: Diagnosis not present

## 2021-07-26 DIAGNOSIS — I10 Essential (primary) hypertension: Secondary | ICD-10-CM

## 2021-07-26 DIAGNOSIS — Z5189 Encounter for other specified aftercare: Secondary | ICD-10-CM

## 2021-07-26 DIAGNOSIS — K255 Chronic or unspecified gastric ulcer with perforation: Secondary | ICD-10-CM | POA: Diagnosis not present

## 2021-07-26 DIAGNOSIS — Z4682 Encounter for fitting and adjustment of non-vascular catheter: Secondary | ICD-10-CM | POA: Diagnosis not present

## 2021-07-26 DIAGNOSIS — Z452 Encounter for adjustment and management of vascular access device: Secondary | ICD-10-CM | POA: Diagnosis not present

## 2021-07-26 DIAGNOSIS — R197 Diarrhea, unspecified: Secondary | ICD-10-CM | POA: Diagnosis not present

## 2021-07-26 DIAGNOSIS — Z20822 Contact with and (suspected) exposure to covid-19: Secondary | ICD-10-CM | POA: Diagnosis not present

## 2021-07-26 DIAGNOSIS — E440 Moderate protein-calorie malnutrition: Secondary | ICD-10-CM | POA: Diagnosis present

## 2021-07-26 DIAGNOSIS — G894 Chronic pain syndrome: Secondary | ICD-10-CM | POA: Diagnosis not present

## 2021-07-26 DIAGNOSIS — J811 Chronic pulmonary edema: Secondary | ICD-10-CM | POA: Diagnosis not present

## 2021-07-26 DIAGNOSIS — N3289 Other specified disorders of bladder: Secondary | ICD-10-CM | POA: Diagnosis not present

## 2021-07-26 DIAGNOSIS — I81 Portal vein thrombosis: Secondary | ICD-10-CM | POA: Diagnosis present

## 2021-07-26 DIAGNOSIS — I517 Cardiomegaly: Secondary | ICD-10-CM | POA: Diagnosis not present

## 2021-07-26 DIAGNOSIS — K750 Abscess of liver: Secondary | ICD-10-CM

## 2021-07-26 DIAGNOSIS — J9601 Acute respiratory failure with hypoxia: Secondary | ICD-10-CM

## 2021-07-26 DIAGNOSIS — R188 Other ascites: Secondary | ICD-10-CM | POA: Diagnosis not present

## 2021-07-26 DIAGNOSIS — K219 Gastro-esophageal reflux disease without esophagitis: Secondary | ICD-10-CM

## 2021-07-26 DIAGNOSIS — K567 Ileus, unspecified: Secondary | ICD-10-CM | POA: Diagnosis not present

## 2021-07-26 DIAGNOSIS — J9602 Acute respiratory failure with hypercapnia: Secondary | ICD-10-CM | POA: Diagnosis not present

## 2021-07-26 DIAGNOSIS — R0902 Hypoxemia: Secondary | ICD-10-CM | POA: Diagnosis not present

## 2021-07-26 DIAGNOSIS — J9811 Atelectasis: Secondary | ICD-10-CM | POA: Diagnosis present

## 2021-07-26 DIAGNOSIS — I160 Hypertensive urgency: Secondary | ICD-10-CM | POA: Diagnosis not present

## 2021-07-26 DIAGNOSIS — J90 Pleural effusion, not elsewhere classified: Secondary | ICD-10-CM | POA: Diagnosis not present

## 2021-07-26 DIAGNOSIS — J432 Centrilobular emphysema: Secondary | ICD-10-CM | POA: Diagnosis not present

## 2021-07-26 DIAGNOSIS — K9189 Other postprocedural complications and disorders of digestive system: Secondary | ICD-10-CM | POA: Diagnosis not present

## 2021-07-26 DIAGNOSIS — J449 Chronic obstructive pulmonary disease, unspecified: Secondary | ICD-10-CM | POA: Diagnosis not present

## 2021-07-26 DIAGNOSIS — J9690 Respiratory failure, unspecified, unspecified whether with hypoxia or hypercapnia: Secondary | ICD-10-CM | POA: Diagnosis not present

## 2021-07-26 DIAGNOSIS — Z978 Presence of other specified devices: Secondary | ICD-10-CM | POA: Diagnosis not present

## 2021-07-26 DIAGNOSIS — E1169 Type 2 diabetes mellitus with other specified complication: Secondary | ICD-10-CM

## 2021-07-26 DIAGNOSIS — F05 Delirium due to known physiological condition: Secondary | ICD-10-CM | POA: Diagnosis not present

## 2021-07-26 DIAGNOSIS — A419 Sepsis, unspecified organism: Secondary | ICD-10-CM | POA: Diagnosis not present

## 2021-07-26 DIAGNOSIS — R6520 Severe sepsis without septic shock: Secondary | ICD-10-CM | POA: Diagnosis not present

## 2021-07-26 DIAGNOSIS — N400 Enlarged prostate without lower urinary tract symptoms: Secondary | ICD-10-CM | POA: Diagnosis not present

## 2021-07-26 DIAGNOSIS — B159 Hepatitis A without hepatic coma: Secondary | ICD-10-CM | POA: Diagnosis present

## 2021-07-26 DIAGNOSIS — K7581 Nonalcoholic steatohepatitis (NASH): Secondary | ICD-10-CM | POA: Diagnosis present

## 2021-07-26 LAB — POCT I-STAT 7, (LYTES, BLD GAS, ICA,H+H)
Acid-Base Excess: 1 mmol/L (ref 0.0–2.0)
Acid-base deficit: 1 mmol/L (ref 0.0–2.0)
Acid-base deficit: 6 mmol/L — ABNORMAL HIGH (ref 0.0–2.0)
Bicarbonate: 20.7 mmol/L (ref 20.0–28.0)
Bicarbonate: 26.2 mmol/L (ref 20.0–28.0)
Bicarbonate: 26.3 mmol/L (ref 20.0–28.0)
Calcium, Ion: 1.24 mmol/L (ref 1.15–1.40)
Calcium, Ion: 1.29 mmol/L (ref 1.15–1.40)
Calcium, Ion: 1.34 mmol/L (ref 1.15–1.40)
HCT: 36 % — ABNORMAL LOW (ref 39.0–52.0)
HCT: 37 % — ABNORMAL LOW (ref 39.0–52.0)
HCT: 38 % — ABNORMAL LOW (ref 39.0–52.0)
Hemoglobin: 12.2 g/dL — ABNORMAL LOW (ref 13.0–17.0)
Hemoglobin: 12.6 g/dL — ABNORMAL LOW (ref 13.0–17.0)
Hemoglobin: 12.9 g/dL — ABNORMAL LOW (ref 13.0–17.0)
O2 Saturation: 100 %
O2 Saturation: 97 %
O2 Saturation: 99 %
Patient temperature: 37.1
Patient temperature: 98.4
Patient temperature: 98.6
Potassium: 3.1 mmol/L — ABNORMAL LOW (ref 3.5–5.1)
Potassium: 3.4 mmol/L — ABNORMAL LOW (ref 3.5–5.1)
Potassium: 3.6 mmol/L (ref 3.5–5.1)
Sodium: 141 mmol/L (ref 135–145)
Sodium: 141 mmol/L (ref 135–145)
Sodium: 143 mmol/L (ref 135–145)
TCO2: 22 mmol/L (ref 22–32)
TCO2: 28 mmol/L (ref 22–32)
TCO2: 28 mmol/L (ref 22–32)
pCO2 arterial: 44.3 mmHg (ref 32.0–48.0)
pCO2 arterial: 44.8 mmHg (ref 32.0–48.0)
pCO2 arterial: 53.9 mmHg — ABNORMAL HIGH (ref 32.0–48.0)
pH, Arterial: 7.278 — ABNORMAL LOW (ref 7.350–7.450)
pH, Arterial: 7.294 — ABNORMAL LOW (ref 7.350–7.450)
pH, Arterial: 7.377 (ref 7.350–7.450)
pO2, Arterial: 173 mmHg — ABNORMAL HIGH (ref 83.0–108.0)
pO2, Arterial: 387 mmHg — ABNORMAL HIGH (ref 83.0–108.0)
pO2, Arterial: 97 mmHg (ref 83.0–108.0)

## 2021-07-26 LAB — URINALYSIS, ROUTINE W REFLEX MICROSCOPIC
Bacteria, UA: NONE SEEN
Bilirubin Urine: NEGATIVE
Glucose, UA: >=500 mg/dL — AB
Ketones, ur: 20 mg/dL — AB
Leukocytes,Ua: NEGATIVE
Nitrite: NEGATIVE
Protein, ur: NEGATIVE mg/dL
RBC / HPF: >50 RBC/hpf — ABNORMAL HIGH (ref 0–5)
Specific Gravity, Urine: 1.037 — ABNORMAL HIGH (ref 1.005–1.030)
pH: 6 (ref 5.0–8.0)

## 2021-07-26 LAB — CBC
HCT: 39.3 % (ref 39.0–52.0)
Hemoglobin: 12.9 g/dL — ABNORMAL LOW (ref 13.0–17.0)
MCH: 30.1 pg (ref 26.0–34.0)
MCHC: 32.8 g/dL (ref 30.0–36.0)
MCV: 91.8 fL (ref 80.0–100.0)
Platelets: 96 10*3/uL — ABNORMAL LOW (ref 150–400)
RBC: 4.28 MIL/uL (ref 4.22–5.81)
RDW: 14.3 % (ref 11.5–15.5)
WBC: 19.4 10*3/uL — ABNORMAL HIGH (ref 4.0–10.5)
nRBC: 0 % (ref 0.0–0.2)

## 2021-07-26 LAB — BASIC METABOLIC PANEL
Anion gap: 11 (ref 5–15)
BUN: 35 mg/dL — ABNORMAL HIGH (ref 8–23)
CO2: 22 mmol/L (ref 22–32)
Calcium: 8.5 mg/dL — ABNORMAL LOW (ref 8.9–10.3)
Chloride: 106 mmol/L (ref 98–111)
Creatinine, Ser: 0.92 mg/dL (ref 0.61–1.24)
GFR, Estimated: >60 mL/min (ref >60)
Glucose, Bld: 281 mg/dL — ABNORMAL HIGH (ref 70–99)
Potassium: 3.8 mmol/L (ref 3.5–5.1)
Sodium: 139 mmol/L (ref 135–145)

## 2021-07-26 LAB — LACTIC ACID, PLASMA: Lactic Acid, Venous: 1.3 mmol/L (ref 0.5–1.9)

## 2021-07-26 LAB — GLUCOSE, CAPILLARY
Glucose-Capillary: 215 mg/dL — ABNORMAL HIGH (ref 70–99)
Glucose-Capillary: 216 mg/dL — ABNORMAL HIGH (ref 70–99)
Glucose-Capillary: 264 mg/dL — ABNORMAL HIGH (ref 70–99)
Glucose-Capillary: 266 mg/dL — ABNORMAL HIGH (ref 70–99)
Glucose-Capillary: 267 mg/dL — ABNORMAL HIGH (ref 70–99)
Glucose-Capillary: 270 mg/dL — ABNORMAL HIGH (ref 70–99)
Glucose-Capillary: 292 mg/dL — ABNORMAL HIGH (ref 70–99)

## 2021-07-26 LAB — MAGNESIUM: Magnesium: 1.8 mg/dL (ref 1.7–2.4)

## 2021-07-26 LAB — RAPID URINE DRUG SCREEN, HOSP PERFORMED
Amphetamines: NOT DETECTED
Barbiturates: NOT DETECTED
Benzodiazepines: NOT DETECTED
Cocaine: NOT DETECTED
Opiates: NOT DETECTED
Tetrahydrocannabinol: NOT DETECTED

## 2021-07-26 LAB — MRSA NEXT GEN BY PCR, NASAL: MRSA by PCR Next Gen: NOT DETECTED

## 2021-07-26 MED ORDER — CHLORHEXIDINE GLUCONATE CLOTH 2 % EX PADS
6.0000 | MEDICATED_PAD | Freq: Every day | CUTANEOUS | Status: DC
  Administered 2021-07-26 – 2021-07-28 (×3): 6 via TOPICAL

## 2021-07-26 MED ORDER — POLYETHYLENE GLYCOL 3350 17 G PO PACK
17.0000 g | PACK | Freq: Every day | ORAL | Status: DC
  Administered 2021-07-26: 17 g
  Filled 2021-07-26: qty 1

## 2021-07-26 MED ORDER — ACETAMINOPHEN 325 MG PO TABS
650.0000 mg | ORAL_TABLET | ORAL | Status: DC | PRN
  Administered 2021-08-02 – 2021-08-13 (×8): 650 mg via ORAL
  Filled 2021-07-26 (×8): qty 2

## 2021-07-26 MED ORDER — DOCUSATE SODIUM 50 MG/5ML PO LIQD
100.0000 mg | Freq: Two times a day (BID) | ORAL | Status: DC
  Administered 2021-07-26: 100 mg
  Filled 2021-07-26: qty 10

## 2021-07-26 MED ORDER — LABETALOL HCL 5 MG/ML IV SOLN
5.0000 mg | Freq: Once | INTRAVENOUS | Status: AC
  Administered 2021-07-26: 5 mg via INTRAVENOUS
  Filled 2021-07-26: qty 4

## 2021-07-26 MED ORDER — FENTANYL BOLUS VIA INFUSION
25.0000 ug | INTRAVENOUS | Status: DC | PRN
  Filled 2021-07-26: qty 100

## 2021-07-26 MED ORDER — DOCUSATE SODIUM 50 MG/5ML PO LIQD: 100.0000 mg | Freq: Two times a day (BID) | ORAL | Status: DC | PRN

## 2021-07-26 MED ORDER — HYDRALAZINE HCL 20 MG/ML IJ SOLN
10.0000 mg | Freq: Four times a day (QID) | INTRAMUSCULAR | Status: DC | PRN
  Administered 2021-07-26 – 2021-07-29 (×3): 10 mg via INTRAVENOUS
  Filled 2021-07-26 (×3): qty 1

## 2021-07-26 MED ORDER — LABETALOL HCL 5 MG/ML IV SOLN
5.0000 mg | Freq: Four times a day (QID) | INTRAVENOUS | Status: DC | PRN
  Administered 2021-07-26 – 2021-08-01 (×2): 5 mg via INTRAVENOUS
  Filled 2021-07-26 (×2): qty 4

## 2021-07-26 MED ORDER — LACTATED RINGERS IV SOLN: INTRAVENOUS | Status: DC

## 2021-07-26 MED ORDER — METRONIDAZOLE 500 MG/100ML IV SOLN
500.0000 mg | Freq: Three times a day (TID) | INTRAVENOUS | Status: DC
Start: 2021-07-26 — End: 2021-07-29
  Administered 2021-07-26 – 2021-07-29 (×10): 500 mg via INTRAVENOUS
  Filled 2021-07-26 (×10): qty 100

## 2021-07-26 MED ORDER — DEXTROSE IN LACTATED RINGERS 5 % IV SOLN: INTRAVENOUS | Status: DC

## 2021-07-26 MED ORDER — BUDESONIDE 0.5 MG/2ML IN SUSP
0.5000 mg | Freq: Two times a day (BID) | RESPIRATORY_TRACT | Status: DC
  Administered 2021-07-26 – 2021-08-14 (×38): 0.5 mg via RESPIRATORY_TRACT
  Filled 2021-07-26 (×38): qty 2

## 2021-07-26 MED ORDER — MAGNESIUM SULFATE 2 GM/50ML IV SOLN
2.0000 g | Freq: Once | INTRAVENOUS | Status: AC
  Administered 2021-07-26: 2 g via INTRAVENOUS
  Filled 2021-07-26: qty 50

## 2021-07-26 MED ORDER — FAMOTIDINE IN NACL 20-0.9 MG/50ML-% IV SOLN
20.0000 mg | Freq: Two times a day (BID) | INTRAVENOUS | Status: DC
  Administered 2021-07-26 (×2): 20 mg via INTRAVENOUS
  Filled 2021-07-26 (×2): qty 50

## 2021-07-26 MED ORDER — ENOXAPARIN SODIUM 40 MG/0.4ML IJ SOSY
40.0000 mg | PREFILLED_SYRINGE | Freq: Every day | INTRAMUSCULAR | Status: DC
  Administered 2021-07-26 – 2021-07-30 (×5): 40 mg via SUBCUTANEOUS
  Filled 2021-07-26 (×5): qty 0.4

## 2021-07-26 MED ORDER — FAMOTIDINE 20 MG PO TABS
20.0000 mg | ORAL_TABLET | Freq: Two times a day (BID) | ORAL | Status: DC
Start: 2021-07-26 — End: 2021-07-26

## 2021-07-26 MED ORDER — SODIUM CHLORIDE 0.9 % IV SOLN
2.0000 g | Freq: Two times a day (BID) | INTRAVENOUS | Status: DC
  Administered 2021-07-26: 2 g via INTRAVENOUS
  Filled 2021-07-26: qty 2

## 2021-07-26 MED ORDER — IPRATROPIUM-ALBUTEROL 0.5-2.5 (3) MG/3ML IN SOLN
3.0000 mL | Freq: Four times a day (QID) | RESPIRATORY_TRACT | Status: DC
  Administered 2021-07-26 (×4): 3 mL via RESPIRATORY_TRACT
  Filled 2021-07-26 (×4): qty 3

## 2021-07-26 MED ORDER — INSULIN GLARGINE-YFGN 100 UNIT/ML ~~LOC~~ SOLN
5.0000 [IU] | Freq: Two times a day (BID) | SUBCUTANEOUS | Status: DC
Start: 2021-07-26 — End: 2021-07-27
  Administered 2021-07-26 (×2): 5 [IU] via SUBCUTANEOUS
  Filled 2021-07-26 (×4): qty 0.05

## 2021-07-26 MED ORDER — IPRATROPIUM-ALBUTEROL 0.5-2.5 (3) MG/3ML IN SOLN
3.0000 mL | Freq: Three times a day (TID) | RESPIRATORY_TRACT | Status: DC
  Administered 2021-07-27 – 2021-08-01 (×14): 3 mL via RESPIRATORY_TRACT
  Filled 2021-07-26 (×15): qty 3

## 2021-07-26 MED ORDER — POLYETHYLENE GLYCOL 3350 17 G PO PACK
17.0000 g | PACK | Freq: Every day | ORAL | Status: DC | PRN
  Administered 2021-08-01: 11:00:00 17 g

## 2021-07-26 MED ORDER — ORAL CARE MOUTH RINSE
15.0000 mL | OROMUCOSAL | Status: DC
  Administered 2021-07-26 (×6): 15 mL via OROMUCOSAL

## 2021-07-26 MED ORDER — PROPOFOL 1000 MG/100ML IV EMUL
0.0000 ug/kg/min | INTRAVENOUS | Status: DC
  Administered 2021-07-26: 40 ug/kg/min via INTRAVENOUS
  Administered 2021-07-26: 50 ug/kg/min via INTRAVENOUS
  Filled 2021-07-26 (×2): qty 100

## 2021-07-26 MED ORDER — CLEVIDIPINE BUTYRATE 0.5 MG/ML IV EMUL
0.0000 mg/h | INTRAVENOUS | Status: DC
  Administered 2021-07-26: 1 mg/h via INTRAVENOUS
  Administered 2021-07-26: 4 mg/h via INTRAVENOUS
  Administered 2021-07-27: 16 mg/h via INTRAVENOUS
  Administered 2021-07-27: 15 mg/h via INTRAVENOUS
  Administered 2021-07-27: 20:00:00 14 mg/h via INTRAVENOUS
  Administered 2021-07-27: 10:00:00 16 mg/h via INTRAVENOUS
  Administered 2021-07-27: 23:00:00 14 mg/h via INTRAVENOUS
  Administered 2021-07-27: 16 mg/h via INTRAVENOUS
  Administered 2021-07-28: 07:00:00 14 mg/h via INTRAVENOUS
  Administered 2021-07-28: 15 mg/h via INTRAVENOUS
  Administered 2021-07-28: 10:00:00 16 mg/h via INTRAVENOUS
  Administered 2021-07-28: 17 mg/h via INTRAVENOUS
  Administered 2021-07-28: 14 mg/h via INTRAVENOUS
  Administered 2021-07-28: 19:00:00 18 mg/h via INTRAVENOUS
  Administered 2021-07-28: 22:00:00 19 mg/h via INTRAVENOUS
  Administered 2021-07-29: 16:00:00 5 mg/h via INTRAVENOUS
  Administered 2021-07-29 (×2): 19 mg/h via INTRAVENOUS
  Administered 2021-07-29: 20 mg/h via INTRAVENOUS
  Administered 2021-07-29: 04:00:00 19 mg/h via INTRAVENOUS
  Administered 2021-07-30 (×2): 7 mg/h via INTRAVENOUS
  Filled 2021-07-26 (×24): qty 100

## 2021-07-26 MED ORDER — ONDANSETRON HCL 4 MG/2ML IJ SOLN: 4.0000 mg | Freq: Four times a day (QID) | INTRAMUSCULAR | Status: DC | PRN

## 2021-07-26 MED ORDER — ORAL CARE MOUTH RINSE
15.0000 mL | Freq: Two times a day (BID) | OROMUCOSAL | Status: DC
  Administered 2021-07-27 – 2021-08-11 (×31): 15 mL via OROMUCOSAL

## 2021-07-26 MED ORDER — SODIUM CHLORIDE 0.9 % IV SOLN: INTRAVENOUS | Status: DC | PRN

## 2021-07-26 MED ORDER — INSULIN ASPART 100 UNIT/ML IJ SOLN
0.0000 [IU] | INTRAMUSCULAR | Status: DC
Start: 2021-07-26 — End: 2021-08-14
  Administered 2021-07-26 (×3): 8 [IU] via SUBCUTANEOUS
  Administered 2021-07-26: 5 [IU] via SUBCUTANEOUS
  Administered 2021-07-26: 8 [IU] via SUBCUTANEOUS
  Administered 2021-07-26: 5 [IU] via SUBCUTANEOUS
  Administered 2021-07-27 (×3): 3 [IU] via SUBCUTANEOUS
  Administered 2021-07-27: 5 [IU] via SUBCUTANEOUS
  Administered 2021-07-27 – 2021-07-28 (×3): 3 [IU] via SUBCUTANEOUS
  Administered 2021-07-28 (×4): 2 [IU] via SUBCUTANEOUS
  Administered 2021-07-28 – 2021-07-29 (×2): 3 [IU] via SUBCUTANEOUS
  Administered 2021-07-29 (×3): 2 [IU] via SUBCUTANEOUS
  Administered 2021-07-30 – 2021-07-31 (×4): 3 [IU] via SUBCUTANEOUS
  Administered 2021-07-31: 5 [IU] via SUBCUTANEOUS
  Administered 2021-07-31 – 2021-08-01 (×3): 3 [IU] via SUBCUTANEOUS
  Administered 2021-08-01: 12:00:00 5 [IU] via SUBCUTANEOUS
  Administered 2021-08-01: 04:00:00 3 [IU] via SUBCUTANEOUS
  Administered 2021-08-01: 17:00:00 5 [IU] via SUBCUTANEOUS
  Administered 2021-08-01 – 2021-08-02 (×3): 3 [IU] via SUBCUTANEOUS
  Administered 2021-08-02: 17:00:00 2 [IU] via SUBCUTANEOUS
  Administered 2021-08-02: 3 [IU] via SUBCUTANEOUS
  Administered 2021-08-02 – 2021-08-03 (×2): 2 [IU] via SUBCUTANEOUS
  Administered 2021-08-03: 23:00:00 8 [IU] via SUBCUTANEOUS
  Administered 2021-08-03: 12:00:00 5 [IU] via SUBCUTANEOUS
  Administered 2021-08-03 (×2): 3 [IU] via SUBCUTANEOUS
  Administered 2021-08-03: 18:00:00 5 [IU] via SUBCUTANEOUS
  Administered 2021-08-04: 08:00:00 3 [IU] via SUBCUTANEOUS
  Administered 2021-08-04: 05:00:00 5 [IU] via SUBCUTANEOUS
  Administered 2021-08-04: 23:00:00 3 [IU] via SUBCUTANEOUS
  Administered 2021-08-04: 01:00:00 5 [IU] via SUBCUTANEOUS
  Administered 2021-08-04 – 2021-08-05 (×4): 3 [IU] via SUBCUTANEOUS
  Administered 2021-08-05 (×2): 2 [IU] via SUBCUTANEOUS
  Administered 2021-08-05: 3 [IU] via SUBCUTANEOUS
  Administered 2021-08-05 – 2021-08-06 (×3): 2 [IU] via SUBCUTANEOUS
  Administered 2021-08-06: 3 [IU] via SUBCUTANEOUS
  Administered 2021-08-06: 2 [IU] via SUBCUTANEOUS
  Administered 2021-08-06 – 2021-08-07 (×2): 3 [IU] via SUBCUTANEOUS
  Administered 2021-08-07 (×2): 2 [IU] via SUBCUTANEOUS
  Administered 2021-08-07: 1 [IU] via SUBCUTANEOUS
  Administered 2021-08-07: 2 [IU] via SUBCUTANEOUS
  Administered 2021-08-07: 3 [IU] via SUBCUTANEOUS
  Administered 2021-08-07 – 2021-08-08 (×2): 2 [IU] via SUBCUTANEOUS
  Administered 2021-08-08: 3 [IU] via SUBCUTANEOUS
  Administered 2021-08-08 (×2): 2 [IU] via SUBCUTANEOUS
  Administered 2021-08-08: 3 [IU] via SUBCUTANEOUS
  Administered 2021-08-09 – 2021-08-10 (×5): 2 [IU] via SUBCUTANEOUS
  Administered 2021-08-10 – 2021-08-11 (×3): 3 [IU] via SUBCUTANEOUS
  Administered 2021-08-11: 2 [IU] via SUBCUTANEOUS
  Administered 2021-08-12: 3 [IU] via SUBCUTANEOUS
  Administered 2021-08-12: 2 [IU] via SUBCUTANEOUS
  Administered 2021-08-12: 3 [IU] via SUBCUTANEOUS
  Administered 2021-08-12 (×2): 5 [IU] via SUBCUTANEOUS
  Administered 2021-08-13: 3 [IU] via SUBCUTANEOUS
  Administered 2021-08-13: 5 [IU] via SUBCUTANEOUS
  Administered 2021-08-13 (×2): 3 [IU] via SUBCUTANEOUS
  Administered 2021-08-13 – 2021-08-14 (×2): 5 [IU] via SUBCUTANEOUS
  Administered 2021-08-14: 11 [IU] via SUBCUTANEOUS
  Administered 2021-08-14: 5 [IU] via SUBCUTANEOUS
  Administered 2021-08-14: 3 [IU] via SUBCUTANEOUS

## 2021-07-26 MED ORDER — ALBUTEROL SULFATE (2.5 MG/3ML) 0.083% IN NEBU
2.5000 mg | INHALATION_SOLUTION | RESPIRATORY_TRACT | Status: DC | PRN
  Administered 2021-08-03: 10:00:00 5 mg via RESPIRATORY_TRACT
  Administered 2021-08-03 – 2021-08-13 (×6): 2.5 mg via RESPIRATORY_TRACT
  Filled 2021-07-26 (×5): qty 3

## 2021-07-26 MED ORDER — FENTANYL 2500MCG IN NS 250ML (10MCG/ML) PREMIX INFUSION
25.0000 ug/h | INTRAVENOUS | Status: DC
  Administered 2021-07-26: 50 ug/h via INTRAVENOUS
  Filled 2021-07-26: qty 250

## 2021-07-26 MED ORDER — FENTANYL CITRATE PF 50 MCG/ML IJ SOSY: 25.0000 ug | PREFILLED_SYRINGE | Freq: Once | INTRAMUSCULAR | Status: DC

## 2021-07-26 MED ORDER — SODIUM CHLORIDE 0.9 % IV SOLN
2.0000 g | Freq: Three times a day (TID) | INTRAVENOUS | Status: DC
  Administered 2021-07-26 – 2021-07-29 (×9): 2 g via INTRAVENOUS
  Filled 2021-07-26 (×9): qty 2

## 2021-07-26 MED ORDER — LOSARTAN POTASSIUM 50 MG PO TABS
50.0000 mg | ORAL_TABLET | Freq: Every day | ORAL | Status: DC
  Administered 2021-07-26 – 2021-07-27 (×2): 50 mg
  Filled 2021-07-26 (×2): qty 1

## 2021-07-26 MED ORDER — FAMOTIDINE IN NACL 20-0.9 MG/50ML-% IV SOLN
20.0000 mg | Freq: Two times a day (BID) | INTRAVENOUS | Status: DC
  Administered 2021-07-26 – 2021-07-30 (×9): 20 mg via INTRAVENOUS
  Filled 2021-07-26 (×8): qty 50

## 2021-07-26 MED ORDER — CHLORHEXIDINE GLUCONATE 0.12% ORAL RINSE (MEDLINE KIT)
15.0000 mL | Freq: Two times a day (BID) | OROMUCOSAL | Status: DC
  Administered 2021-07-26: 15 mL via OROMUCOSAL

## 2021-07-26 NOTE — Progress Notes (Signed)
NAME:  TARVARIS PUGLIA MRN:  [id number] DOB:  [birth year] LOS: 0 ADMISSION DATE:  07/25/2021    Brief History   Mr. Dibartolo is a 70 year old male currently hospital day #1 admitted to the ICU with sepsis and hepatitis secondary to ruptured pyogenic liver abscesses.  He is s/p ex lap and drainage of liver abscesses and partial omentectomy 11/18. Currently intubated and sedated.   Interim history/subjective:  Patient intubated and sedated. No acute distress.  Will not awake to sternal rub.  Significant Hospital Events   11/18: went from ER to OR for exploratory laparotomy and drainage of pyogenic liver abscesses (2) and partial omentectomy 11/19: ID consulted  Procedures:  11/18 STAT Exploratory laparotomy and drainage of pyogenic liver abscesses (2) and partial omentectomy 11/19 ID consulted  Significant Diagnostic Tests:  11/18 CXR: negative Left hip XR: negative Right hip XR: negative CT head: negative for acute abnormality CT C-spine: no acute displaced fracture or traumatic listhesis  CT abd: pneumoperitoneum and small ascites in the upper abdomen possibly related to gastric perforation or rupture, cholelithiasis, fatty liver, enlarged prostate 11/19 CXR: negative  Antimicrobials:  Cefepime (11/18 - ) Flagyl (11/18 -  )  Objective   BP 126/62   Pulse (!) 113   Temp 99.6 F (37.6 C) (Oral)   Resp 19   Ht 5' 7"  (1.702 m)   Wt 81.4 kg   SpO2 100%   BMI 28.10 kg/m     Filed Weights   07/25/21 1529 07/26/21 0100  Weight: 84 kg 81.4 kg    Intake/Output Summary (Last 24 hours) at 07/26/2021 1052 Last data filed at 07/26/2021 0731 Gross per 24 hour  Intake 4383.52 ml  Output 1610 ml  Net 2773.52 ml    Vent Mode: PRVC FiO2 (%):  [40 %-60 %] 40 % Set Rate:  [15 bmp-18 bmp] 18 bmp Vt Set:  [520 mL] 520 mL PEEP:  [5 cmH20] 5 cmH20 Plateau Pressure:  [15 cmH20-16 cmH20] 16 cmH20   Examination: General: Intubated, responsive to commands off sedation, critically ill  appearing Cardiac: Regular rate and rhythm, no murmurs appreciated Respiratory: Clear auscultation bilaterally Abdominal: Soft, nondistended, bowel sounds present Extremities: Cold feet with dopplerable pulses   Resolved Hospital Problem list   None yet  Assessment & Plan:  Sepsis  Ruptured pyogenic liver abscesses x2 s/p surgical intervention  WBC 19.4 this morning, patient currently afebrile. -Continue empiric cefepime and Flagyl -ID consulted, appreciate recommendations -Per surgery, okay to start Lovenox 30 mg daily  Hepatitis secondary to ruptured pyogenic liver abscesses AST 103, ALT 246, alk phos 206.  Suspect due to ruptured pyogenic liver abscesses. - CMP daily - Consider hepatitis panel if no improvement  Hypertension Given hydralazine and labetalol as needed last night without effect.  Currently on Cleviprex GGT.  Goal SBP <180. -Start home losartan  T2DM Patient was on D5 LR postsurgery, overnight glucose have been 264-332. - Start insulin Semglee 5 units twice daily - DC D5 LR - Start LR maintenance fluid at 1.5 mL/h -CBG every 4 - Continue moderate sliding scale insulin  COPD  Intubated post-op Patient doing well on reduced sedation, plan to extubate later today.  We will collect ABG around time of extubation. - Continue scheduled DuoNeb  GERD -Continue Protonix  Best practice:  Diet: Pending swallow study s/p extubation Pain/Anxiety/Delirium protocol (if indicated): propofol/fentanyl VAP protocol (if indicated): YES DVT prophylaxis: Lovenox GI prophylaxis: Protonix Glucose control: sliding scale insulin Mobility/Activity: bedrest   Code Status:  Full Code  Family Communication:  Will call family Disposition: keep in ICU   Ezequiel Essex, MD Family Medicine PGY-2 [facility]

## 2021-07-26 NOTE — Progress Notes (Signed)
Pt given PRN Hydralazine, with no effect what so ever. Pt comfortable and CPOT zero. [facility] contacted and made aware

## 2021-07-26 NOTE — Procedures (Signed)
Extubation Procedure Note  Patient Details:   Name: Matthew Dominguez DOB: Mar 20, 1951 MRN: [id number]   Airway Documentation:    Vent end date: 07/26/21 Vent end time: 1120   Evaluation  O2 sats: stable throughout Complications: No apparent complications Patient did tolerate procedure well. Bilateral Breath Sounds: Diminished   Yes, Pt had positive cuff leak  Gonzella Lex 07/26/2021, 11:25 AM

## 2021-07-26 NOTE — Progress Notes (Signed)
Initial Nutrition Assessment  DOCUMENTATION CODES:   Not applicable  INTERVENTION:  Monitor for plan of care/diet advancement  If pt unable to pass swallow evaluation, recommend initiation of nutrition support via small bore (10fr) NGT. Consider: Osmolite 1.5 @ 52ml/hr -- once able, advance to goal rate of 80ml/hr 97ml Prosource TF daily Goal TF to provide 2200 kcals, 101 grams protein, 1098ml free water  NUTRITION DIAGNOSIS:   Increased nutrient needs related to wound healing as evidenced by estimated needs.  GOAL:   Patient will meet greater than or equal to 90% of their needs  MONITOR:   Labs, Weight trends, I & O's, Skin, Diet advancement  REASON FOR ASSESSMENT:   Consult Enteral/tube feeding initiation and management (surgery ok'd trickles)  ASSESSMENT:   Pt admitted with sepsis and hepatitis 2/2 ruptured pyogenic liver abscesses. PMH includes HTN, HLD, DM, COPD, hypothyroidism, mild cognitive impairment.  11/18: went from ER to OR for exploratory laparotomy and drainage of pyogenic liver abscesses (2) and partial omentectomy; intubated 11/19: ID consulted; extubated  Pt with pneumoperitoneum, s/p ex lap 11/18, determined to be 2/2 liver abscess. WOC consulted for vac placement. RD was consulted for TF initiation (trickles only per Surgery); however, pt w/o access. Discussed pt with RN who reports pt has pulled 2 NGTs and Surgery recommended she hold off on replacing NGT again at this time. Per RN, pt too lethargic for swallow eval at this time. Planning to re-attempt in AM. Discussed plan for if pt does not pass/is too lethargic for swallow eval tomorrow.   RD working remotely and unable to obtain diet/weight history at this time. Per H&P, pt endorsed 1 day h/o abdominal pain and nausea, but denied emesis.   UOP: 872ml x24 hours Drain output: 451ml x24 hours EBL: 284ml x24 hours I/O: +3661ml since admit  Medications: colace, pepcid, SSI Q4H, semglee 5 units BID,  miralax, IV abx cleviprex @ 79ml/hr (providing 288 kcals/d at current rate) LR @ 162ml/hr Labs: K+ 3.4 (L), BUN 35 (H) CBGs 216-346 x 24 hours  NUTRITION - FOCUSED PHYSICAL EXAM: Unable to perform at this time. Will attempt at follow-up.   Diet Order:   Diet Order             Diet NPO time specified Except for: Sips with Meds, Ice Chips  Diet effective now                   EDUCATION NEEDS:   No education needs have been identified at this time  Skin:  Skin Assessment: Skin Integrity Issues: Skin Integrity Issues:: Unstageable, DTI, Incisions DTI: coccyx Unstageable: bilateral knees and elbows Incisions: abdomen  Last BM:  PTA  Height:   Ht Readings from Last 1 Encounters:  07/25/21 5\' 7"  (1.702 m)    Weight:   Wt Readings from Last 10 Encounters:  07/26/21 81.4 kg  07/15/21 82.4 kg  04/18/21 83.6 kg  12/23/20 87.4 kg  08/22/20 85.5 kg  04/18/20 85.4 kg  09/21/19 88.9 kg  05/18/19 88.5 kg  08/16/18 89.8 kg  08/08/18 92.5 kg    BMI:  Body mass index is 28.1 kg/m.  Estimated Nutritional Needs:   Kcal:  2050-2250  Protein:  100-115 grams  Fluid:  >2L     Theone Stanley., MS, RD, LDN (she/her/hers) RD pager number and weekend/on-call pager number located in [place].

## 2021-07-26 NOTE — Progress Notes (Signed)
[name] Progress Note Patient Name: Matthew Dominguez DOB: March 12, 1951 MRN: [id number]   Date of Service  07/26/2021  HPI/Events of Note  Pt is confused and continuously bending arm tryin to take off mit. Cant get meds infused d/t this. Nursing request for restraints.   eICU Interventions  Will order bilateral soft wrist restraints X 11 hours.      Intervention Category Major Interventions: Other:  Lysle Dingwall 07/26/2021, 10:44 PM
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
## 2021-07-26 NOTE — Progress Notes (Addendum)
Pt arrived from OR with 1 pink denture cup that held 1 single denture.

## 2021-07-26 NOTE — Progress Notes (Signed)
General Surgery Follow Up Note  Subjective:    Overnight Issues:   Objective:  Vital signs for last 24 hours: Temp:  [98.2 F (36.8 C)-99.6 F (37.6 C)] 99.6 F (37.6 C) (11/19 0730) Pulse Rate:  [89-117] 106 (11/19 0700) Resp:  [15-28] 20 (11/19 0731) BP: (115-185)/(48-94) 143/63 (11/19 0700) SpO2:  [90 %-100 %] 98 % (11/19 0700) Arterial Line BP: (161-203)/(47-56) 182/47 (11/19 0700) FiO2 (%):  [40 %-60 %] 40 % (11/19 0731) Weight:  [81.4 kg-84 kg] 81.4 kg (11/19 0100)  Hemodynamic parameters for last 24 hours:    Intake/Output from previous day: 11/18 0701 - 11/19 0700 In: 4383.5 [I.V.:3933.5; IV Piggyback:450.1] Out: 1520 [Urine:875; Drains:445; Blood:200]  Intake/Output this shift: Total I/O In: -  Out: 90 [Urine:90]  Vent settings for last 24 hours: Vent Mode: PRVC FiO2 (%):  [40 %-60 %] 40 % Set Rate:  [15 bmp-18 bmp] 18 bmp Vt Set:  [520 mL] 520 mL PEEP:  [5 cmH20] 5 cmH20 Plateau Pressure:  [15 cmH20-16 cmH20] 16 cmH20  Physical Exam:  Gen: comfortable, no distress Neuro: non-focal exam HEENT: PERRL Neck: supple CV: RRR Pulm: unlabored breathing Abd: soft, dressings clean, JP x2 R>L o/p GU: clear yellow urine Extr: wwp, no edema   Results for orders placed or performed during the hospital encounter of 07/25/21 (from the past 24 hour(s))  Comprehensive metabolic panel     Status: Abnormal   Collection Time: 07/25/21  3:27 PM  Result Value Ref Range   Sodium 136 135 - 145 mmol/L   Potassium 3.8 3.5 - 5.1 mmol/L   Chloride 100 98 - 111 mmol/L   CO2 21 (L) 22 - 32 mmol/L   Glucose, Bld 385 (H) 70 - 99 mg/dL   BUN 41 (H) 8 - 23 mg/dL   Creatinine, Ser 1.16 0.61 - 1.24 mg/dL   Calcium 9.1 8.9 - 10.3 mg/dL   Total Protein 5.7 (L) 6.5 - 8.1 g/dL   Albumin 1.6 (L) 3.5 - 5.0 g/dL   AST 103 (H) 15 - 41 U/L   ALT 246 (H) 0 - 44 U/L   Alkaline Phosphatase 206 (H) 38 - 126 U/L   Total Bilirubin 3.6 (H) 0.3 - 1.2 mg/dL   GFR, Estimated >60 >60  mL/min   Anion gap 15 5 - 15  CBC with Differential     Status: Abnormal   Collection Time: 07/25/21  3:27 PM  Result Value Ref Range   WBC 25.7 (H) 4.0 - 10.5 K/uL   RBC 4.85 4.22 - 5.81 MIL/uL   Hemoglobin 14.9 13.0 - 17.0 g/dL   HCT 44.2 39.0 - 52.0 %   MCV 91.1 80.0 - 100.0 fL   MCH 30.7 26.0 - 34.0 pg   MCHC 33.7 30.0 - 36.0 g/dL   RDW 14.3 11.5 - 15.5 %   Platelets 95 (L) 150 - 400 K/uL   nRBC 0.0 0.0 - 0.2 %   Neutrophils Relative % 90 %   Neutro Abs 23.1 (H) 1.7 - 7.7 K/uL   Lymphocytes Relative 5 %   Lymphs Abs 1.3 0.7 - 4.0 K/uL   Monocytes Relative 3 %   Monocytes Absolute 0.9 0.1 - 1.0 K/uL   Eosinophils Relative 0 %   Eosinophils Absolute 0.0 0.0 - 0.5 K/uL   Basophils Relative 1 %   Basophils Absolute 0.1 0.0 - 0.1 K/uL   Immature Granulocytes 1 %   Abs Immature Granulocytes 0.31 (H) 0.00 - 0.07 K/uL  Lipase,  blood     Status: None   Collection Time: 07/25/21  3:27 PM  Result Value Ref Range   Lipase 30 11 - 51 U/L  CK     Status: Abnormal   Collection Time: 07/25/21  3:27 PM  Result Value Ref Range   Total CK 406 (H) 49 - 397 U/L  Troponin I (High Sensitivity)     Status: Abnormal   Collection Time: 07/25/21  3:27 PM  Result Value Ref Range   Troponin I (High Sensitivity) 94 (H) <18 ng/L  Lactic acid, plasma     Status: None   Collection Time: 07/25/21  3:30 PM  Result Value Ref Range   Lactic Acid, Venous 1.9 0.5 - 1.9 mmol/L  CBG monitoring, ED     Status: Abnormal   Collection Time: 07/25/21  3:31 PM  Result Value Ref Range   Glucose-Capillary 346 (H) 70 - 99 mg/dL   Comment 1 Notify RN    Comment 2 Document in Chart   Resp Panel by RT-PCR (Flu A&B, Covid) Nasopharyngeal Swab     Status: None   Collection Time: 07/25/21  3:48 PM   Specimen: Nasopharyngeal Swab; Nasopharyngeal(NP) swabs in vial transport medium  Result Value Ref Range   SARS Coronavirus 2 by RT PCR NEGATIVE NEGATIVE   Influenza A by PCR NEGATIVE NEGATIVE   Influenza B by PCR  NEGATIVE NEGATIVE  Ammonia     Status: None   Collection Time: 07/25/21  4:47 PM  Result Value Ref Range   Ammonia 20 9 - 35 umol/L  Protime-INR     Status: Abnormal   Collection Time: 07/25/21  4:47 PM  Result Value Ref Range   Prothrombin Time 15.9 (H) 11.4 - 15.2 seconds   INR 1.3 (H) 0.8 - 1.2  APTT     Status: None   Collection Time: 07/25/21  4:47 PM  Result Value Ref Range   aPTT 26 24 - 36 seconds  Ethanol     Status: None   Collection Time: 07/25/21  4:47 PM  Result Value Ref Range   Alcohol, Ethyl (B) <10 <10 mg/dL  Lactic acid, plasma     Status: None   Collection Time: 07/25/21  6:30 PM  Result Value Ref Range   Lactic Acid, Venous 1.6 0.5 - 1.9 mmol/L  Troponin I (High Sensitivity)     Status: Abnormal   Collection Time: 07/25/21  6:30 PM  Result Value Ref Range   Troponin I (High Sensitivity) 112 (HH) <18 ng/L  ABO/Rh     Status: None   Collection Time: 07/25/21  9:21 PM  Result Value Ref Range   ABO/RH(D)      A POS Performed at [facility], [address] [place], [place] [address]   Glucose, capillary     Status: Abnormal   Collection Time: 07/25/21 10:06 PM  Result Value Ref Range   Glucose-Capillary 332 (H) 70 - 99 mg/dL  Type and screen [place]     Status: None   Collection Time: 07/25/21 11:00 PM  Result Value Ref Range   ABO/RH(D) A POS    Antibody Screen NEG    Sample Expiration      07/28/2021,2359 Performed at [facility], [place] [address], [place], [place] [address]   Glucose, capillary     Status: Abnormal   Collection Time: 07/25/21 11:01 PM  Result Value Ref Range   Glucose-Capillary 301 (H) 70 - 99 mg/dL  I-STAT 7, (LYTES, BLD GAS, ICA, H+H)     Status: Abnormal   Collection Time: 07/25/21 11:25 PM  Result Value Ref Range   pH, Arterial 7.278 (L) 7.350 - 7.450   pCO2 arterial 44.3 32.0 - 48.0 mmHg   pO2, Arterial 173 (H) 83.0 - 108.0 mmHg   Bicarbonate 20.7 20.0 - 28.0 mmol/L   TCO2 22 22 - 32  mmol/L   O2 Saturation 99.0 %   Acid-base deficit 6.0 (H) 0.0 - 2.0 mmol/L   Sodium 141 135 - 145 mmol/L   Potassium 3.1 (L) 3.5 - 5.1 mmol/L   Calcium, Ion 1.34 1.15 - 1.40 mmol/L   HCT 37.0 (L) 39.0 - 52.0 %   Hemoglobin 12.6 (L) 13.0 - 17.0 g/dL   Patient temperature 37.1 C    Sample type ARTERIAL   Glucose, capillary     Status: Abnormal   Collection Time: 07/25/21 11:41 PM  Result Value Ref Range   Glucose-Capillary 267 (H) 70 - 99 mg/dL  I-STAT 7, (LYTES, BLD GAS, ICA, H+H)     Status: Abnormal   Collection Time: 07/26/21 12:39 AM  Result Value Ref Range   pH, Arterial 7.294 (L) 7.350 - 7.450   pCO2 arterial 53.9 (H) 32.0 - 48.0 mmHg   pO2, Arterial 387 (H) 83.0 - 108.0 mmHg   Bicarbonate 26.2 20.0 - 28.0 mmol/L   TCO2 28 22 - 32 mmol/L   O2 Saturation 100.0 %   Acid-base deficit 1.0 0.0 - 2.0 mmol/L   Sodium 141 135 - 145 mmol/L   Potassium 3.6 3.5 - 5.1 mmol/L   Calcium, Ion 1.29 1.15 - 1.40 mmol/L   HCT 36.0 (L) 39.0 - 52.0 %   Hemoglobin 12.2 (L) 13.0 - 17.0 g/dL   Patient temperature 98.4 F    Sample type ARTERIAL   Culture, Respiratory w Gram Stain (tracheal aspirate)     Status: None (Preliminary result)   Collection Time: 07/26/21 12:41 AM   Specimen: Tracheal Aspirate; Respiratory  Result Value Ref Range   Specimen Description TRACHEAL ASPIRATE    Special Requests Normal    Gram Stain      NO SQUAMOUS EPITHELIAL CELLS SEEN FEW WBC SEEN NO ORGANISMS SEEN Performed at [facility], [address], [place], [place] [address]    Culture PENDING    Report Status PENDING   Lactic acid, plasma     Status: None   Collection Time: 07/26/21  1:11 AM  Result Value Ref Range   Lactic Acid, Venous 1.3 0.5 - 1.9 mmol/L  Urine rapid drug screen (hosp performed)     Status: None   Collection Time: 07/26/21  1:13 AM  Result Value Ref Range   Opiates NONE DETECTED NONE DETECTED   Cocaine NONE DETECTED NONE DETECTED   Benzodiazepines NONE DETECTED NONE DETECTED    Amphetamines NONE DETECTED NONE DETECTED   Tetrahydrocannabinol NONE DETECTED NONE DETECTED   Barbiturates NONE DETECTED NONE DETECTED  CBC     Status: Abnormal   Collection Time: 07/26/21  1:13 AM  Result Value Ref Range   WBC 19.4 (H) 4.0 - 10.5 K/uL   RBC 4.28 4.22 - 5.81 MIL/uL   Hemoglobin 12.9 (L) 13.0 - 17.0 g/dL   HCT 39.3 39.0 - 52.0 %   MCV 91.8 80.0 - 100.0 fL   MCH 30.1 26.0 - 34.0 pg   MCHC 32.8 30.0 - 36.0 g/dL   RDW 14.3 11.5 - 15.5 %   Platelets 96 (  L) 150 - 400 K/uL   nRBC 0.0 0.0 - 0.2 %  Basic metabolic panel     Status: Abnormal   Collection Time: 07/26/21  1:13 AM  Result Value Ref Range   Sodium 139 135 - 145 mmol/L   Potassium 3.8 3.5 - 5.1 mmol/L   Chloride 106 98 - 111 mmol/L   CO2 22 22 - 32 mmol/L   Glucose, Bld 281 (H) 70 - 99 mg/dL   BUN 35 (H) 8 - 23 mg/dL   Creatinine, Ser 0.92 0.61 - 1.24 mg/dL   Calcium 8.5 (L) 8.9 - 10.3 mg/dL   GFR, Estimated >60 >60 mL/min   Anion gap 11 5 - 15  Magnesium     Status: None   Collection Time: 07/26/21  1:13 AM  Result Value Ref Range   Magnesium 1.8 1.7 - 2.4 mg/dL  MRSA Next Gen by PCR, Nasal     Status: None   Collection Time: 07/26/21  1:13 AM   Specimen: Nasal Mucosa; Nasal Swab  Result Value Ref Range   MRSA by PCR Next Gen NOT DETECTED NOT DETECTED  Glucose, capillary     Status: Abnormal   Collection Time: 07/26/21  1:13 AM  Result Value Ref Range   Glucose-Capillary 264 (H) 70 - 99 mg/dL  Urinalysis, Routine w reflex microscopic Nasal Mucosa     Status: Abnormal   Collection Time: 07/26/21  1:18 AM  Result Value Ref Range   Color, Urine AMBER (A) YELLOW   APPearance CLEAR CLEAR   Specific Gravity, Urine 1.037 (H) 1.005 - 1.030   pH 6.0 5.0 - 8.0   Glucose, UA >=500 (A) NEGATIVE mg/dL   Hgb urine dipstick MODERATE (A) NEGATIVE   Bilirubin Urine NEGATIVE NEGATIVE   Ketones, ur 20 (A) NEGATIVE mg/dL   Protein, ur NEGATIVE NEGATIVE mg/dL   Nitrite NEGATIVE NEGATIVE   Leukocytes,Ua  NEGATIVE NEGATIVE   RBC / HPF >50 (H) 0 - 5 RBC/hpf   WBC, UA 0-5 0 - 5 WBC/hpf   Bacteria, UA NONE SEEN NONE SEEN   Squamous Epithelial / LPF 0-5 0 - 5   Mucus PRESENT    Hyaline Casts, UA PRESENT   Glucose, capillary     Status: Abnormal   Collection Time: 07/26/21  4:06 AM  Result Value Ref Range   Glucose-Capillary 292 (H) 70 - 99 mg/dL  Glucose, capillary     Status: Abnormal   Collection Time: 07/26/21  7:31 AM  Result Value Ref Range   Glucose-Capillary 266 (H) 70 - 99 mg/dL    Assessment & Plan: The plan of care was discussed with the bedside nurse for the day, who is in agreement with this plan and no additional concerns were raised.   Present on Admission:  Pyogenic liver abscess  Essential hypertension, benign  COPD (chronic obstructive pulmonary disease) (HCC)  GERD (gastroesophageal reflux disease)    LOS: 0 days   Additional comments:I reviewed the patient's new clinical lab test results.   and I reviewed the patients new imaging test results.    Pneumoperitoneum - s/p exlap 11/18 by Dr. Dema Severin, determined to be 2/2 liver abscess. Continue drains. WOC c/s for vac placement to midline.On cefepime/flagyl per ICU team.  FEN - okay to start trickle TF, but do not advance further. DVT - SCDs, recommend lovenox 30mg  daily for chemical DVT ppx Dispo - per ICU team    Jesusita Oka, MD Trauma & General Surgery Please use [email or web address] to  contact on call provider  07/26/2021  *Care during the described time interval was provided by me. I have reviewed this patient's available data, including medical history, events of note, physical examination and test results as part of my evaluation.

## 2021-07-26 NOTE — Progress Notes (Signed)
Mg 1.8 Replaced per protocol  

## 2021-07-26 NOTE — Anesthesia Postprocedure Evaluation (Signed)
Anesthesia Post Note  Patient: Matthew Dominguez  Procedure(s) Performed: EXPLORATORY LAPAROTOMY     Patient location during evaluation: ICU Anesthesia Type: General Level of consciousness: sedated Pain management: pain level controlled Vital Signs Assessment: post-procedure vital signs reviewed and stable Respiratory status: patient remains intubated per anesthesia plan Cardiovascular status: stable Postop Assessment: no apparent nausea or vomiting Anesthetic complications: no   No notable events documented.  Last Vitals:  Vitals:   07/26/21 0545 07/26/21 0600  BP:  130/73  Pulse: (!) 101 (!) 102  Resp: 18 19  Temp:    SpO2: 99% 99%    Last Pain:  Vitals:   07/26/21 0400  TempSrc: Oral  PainSc:                  Shawnta Zimbelman P Gabryela Kimbrell

## 2021-07-26 NOTE — Progress Notes (Signed)
Sputum culture sent to lab

## 2021-07-26 NOTE — Transfer of Care (Signed)
Immediate Anesthesia Transfer of Care Note  Patient: Matthew Dominguez  Procedure(s) Performed: EXPLORATORY LAPAROTOMY  Patient Location: ICU  Anesthesia Type:General  Level of Consciousness: Patient remains intubated per anesthesia plan  Airway & Oxygen Therapy: Patient remains intubated per anesthesia plan  Post-op Assessment: Report given to RN  Post vital signs: Reviewed and stable  Last Vitals:  Vitals Value Taken Time  BP    Temp    Pulse    Resp    SpO2      Last Pain:  Vitals:   07/25/21 2035  TempSrc: Axillary  PainSc:          Complications: No notable events documented.

## 2021-07-26 NOTE — Sepsis Progress Note (Signed)
Pt taken to OR before blood cultures obtained/ordered, antibiotics were given prior to obtaining blood cultures

## 2021-07-26 NOTE — Progress Notes (Signed)
[place] [facility] Progress Note Patient Name: Matthew Dominguez DOB: May 21, 1951 MRN: [id number]   Date of Service  07/26/2021  HPI/Events of Note  SBP 198/55 despite PRN meds  eICU Interventions  Start cleviprex gtt for SBP goal <180     Intervention Category Major Interventions: Hypertension - evaluation and management  Marilea Gwynne [name] 07/26/2021, 6:08 AM

## 2021-07-26 NOTE — Progress Notes (Addendum)
[place] Progress Note Patient Name: Matthew Dominguez DOB: 1951-04-11 MRN: [id number]   Date of Service  07/26/2021  HPI/Events of Note  SBP >180-190  eICU Interventions  PRN hydralazine ordered without effect. IV labetalol 5 mg PRN ordered     Intervention Category Major Interventions: Hypertension - evaluation and management  Matthew Dominguez [name] 07/26/2021, 2:11 AM

## 2021-07-26 NOTE — Progress Notes (Signed)
[facility] Progress Note Patient Name: Matthew Dominguez DOB: [birth year] MRN: [id number]   Date of Service  07/26/2021  HPI/Events of Note  Admitted by Gen Surg  52M with COPD, HTN, DM, HLD and hypothyroidism with confusion and abdominal pain x 1 day found down. CT concerning with pneumoperitoneal concerning for perforation. Surgery consulted. Ex-lap with ruptured pyogenic liver abscesses x 2. Labs sig for WBC 26, AP 206, mild AST/ALT elevation. T bili 3.6. PCCM consulted for septic shock. Remains on vent post-op. Sedated on propofol.  Camera check: Sedated patient on MV. VSS. On minimal vent settings.   eICU Interventions  -Sepsis 2/2 pyogenic abscess - Cefepime/Flagyl. Off pressors MAP goal >65. Trend LA -AHRF 2/2 post-op - SBT/WUA in am. Full vent support for now. BD -Hyperglycemia - DC D5 in IVF     Intervention Category Evaluation Type: New Patient Evaluation  [name] 07/26/2021, 1:07 AM

## 2021-07-26 NOTE — Progress Notes (Signed)
[facility] Progress Note Patient Name: Matthew Dominguez DOB: Jan 06, 1951 MRN: [id number]   Date of Service  07/26/2021  HPI/Events of Note  Patient pulled out NGT X 2. Surgery does not want to replace. Nursing request to change Pepcid from per tube to IV.   eICU Interventions  Plan: Will change Pepcid from per tube to IV.     Intervention Category Major Interventions: Other:  Lysle Dingwall 07/26/2021, 7:39 PM

## 2021-07-26 NOTE — Sepsis Progress Note (Signed)
Following per sepsis protocol   

## 2021-07-27 ENCOUNTER — Inpatient Hospital Stay (HOSPITAL_COMMUNITY): Payer: Medicare HMO

## 2021-07-27 DIAGNOSIS — E1169 Type 2 diabetes mellitus with other specified complication: Secondary | ICD-10-CM | POA: Diagnosis not present

## 2021-07-27 DIAGNOSIS — I10 Essential (primary) hypertension: Secondary | ICD-10-CM | POA: Diagnosis not present

## 2021-07-27 DIAGNOSIS — Z794 Long term (current) use of insulin: Secondary | ICD-10-CM | POA: Diagnosis not present

## 2021-07-27 DIAGNOSIS — K750 Abscess of liver: Secondary | ICD-10-CM | POA: Diagnosis not present

## 2021-07-27 LAB — CBC
HCT: 37.5 % — ABNORMAL LOW (ref 39.0–52.0)
Hemoglobin: 12.5 g/dL — ABNORMAL LOW (ref 13.0–17.0)
MCH: 30.3 pg (ref 26.0–34.0)
MCHC: 33.3 g/dL (ref 30.0–36.0)
MCV: 91 fL (ref 80.0–100.0)
Platelets: 120 10*3/uL — ABNORMAL LOW (ref 150–400)
RBC: 4.12 MIL/uL — ABNORMAL LOW (ref 4.22–5.81)
RDW: 14.2 % (ref 11.5–15.5)
WBC: 16.8 10*3/uL — ABNORMAL HIGH (ref 4.0–10.5)
nRBC: 0 % (ref 0.0–0.2)

## 2021-07-27 LAB — POCT I-STAT 7, (LYTES, BLD GAS, ICA,H+H)
Acid-Base Excess: 1 mmol/L (ref 0.0–2.0)
Acid-Base Excess: 0 mmol/L (ref 0.0–2.0)
Bicarbonate: 25.6 mmol/L (ref 20.0–28.0)
Bicarbonate: 25.3 mmol/L (ref 20.0–28.0)
Calcium, Ion: 1.28 mmol/L (ref 1.15–1.40)
Calcium, Ion: 1.28 mmol/L (ref 1.15–1.40)
HCT: 35 % — ABNORMAL LOW (ref 39.0–52.0)
HCT: 34 % — ABNORMAL LOW (ref 39.0–52.0)
Hemoglobin: 11.9 g/dL — ABNORMAL LOW (ref 13.0–17.0)
Hemoglobin: 11.6 g/dL — ABNORMAL LOW (ref 13.0–17.0)
O2 Saturation: 84 %
O2 Saturation: 93 %
Patient temperature: 98.3
Patient temperature: 101.2
Potassium: 3.2 mmol/L — ABNORMAL LOW (ref 3.5–5.1)
Potassium: 3.2 mmol/L — ABNORMAL LOW (ref 3.5–5.1)
Sodium: 145 mmol/L (ref 135–145)
Sodium: 145 mmol/L (ref 135–145)
TCO2: 27 mmol/L (ref 22–32)
TCO2: 27 mmol/L (ref 22–32)
pCO2 arterial: 38.8 mmHg (ref 32.0–48.0)
pCO2 arterial: 44.2 mmHg (ref 32.0–48.0)
pH, Arterial: 7.426 (ref 7.350–7.450)
pH, Arterial: 7.372 (ref 7.350–7.450)
pO2, Arterial: 47 mmHg — ABNORMAL LOW (ref 83.0–108.0)
pO2, Arterial: 73 mmHg — ABNORMAL LOW (ref 83.0–108.0)

## 2021-07-27 LAB — GLUCOSE, CAPILLARY
Glucose-Capillary: 160 mg/dL — ABNORMAL HIGH (ref 70–99)
Glucose-Capillary: 167 mg/dL — ABNORMAL HIGH (ref 70–99)
Glucose-Capillary: 168 mg/dL — ABNORMAL HIGH (ref 70–99)
Glucose-Capillary: 175 mg/dL — ABNORMAL HIGH (ref 70–99)
Glucose-Capillary: 181 mg/dL — ABNORMAL HIGH (ref 70–99)
Glucose-Capillary: 182 mg/dL — ABNORMAL HIGH (ref 70–99)
Glucose-Capillary: 205 mg/dL — ABNORMAL HIGH (ref 70–99)

## 2021-07-27 LAB — COMPREHENSIVE METABOLIC PANEL
ALT: 103 U/L — ABNORMAL HIGH (ref 0–44)
AST: 43 U/L — ABNORMAL HIGH (ref 15–41)
Albumin: <1.5 g/dL — ABNORMAL LOW (ref 3.5–5.0)
Alkaline Phosphatase: 127 U/L — ABNORMAL HIGH (ref 38–126)
Anion gap: 7 (ref 5–15)
BUN: 30 mg/dL — ABNORMAL HIGH (ref 8–23)
CO2: 25 mmol/L (ref 22–32)
Calcium: 8.3 mg/dL — ABNORMAL LOW (ref 8.9–10.3)
Chloride: 111 mmol/L (ref 98–111)
Creatinine, Ser: 0.86 mg/dL (ref 0.61–1.24)
GFR, Estimated: >60 mL/min (ref >60)
Glucose, Bld: 185 mg/dL — ABNORMAL HIGH (ref 70–99)
Potassium: 3.3 mmol/L — ABNORMAL LOW (ref 3.5–5.1)
Sodium: 143 mmol/L (ref 135–145)
Total Bilirubin: 2.1 mg/dL — ABNORMAL HIGH (ref 0.3–1.2)
Total Protein: 5.2 g/dL — ABNORMAL LOW (ref 6.5–8.1)

## 2021-07-27 LAB — LIPASE, BLOOD: Lipase: 36 U/L (ref 11–51)

## 2021-07-27 LAB — HEPATITIS B SURFACE ANTIGEN: Hepatitis B Surface Ag: NONREACTIVE

## 2021-07-27 LAB — MAGNESIUM: Magnesium: 2.1 mg/dL (ref 1.7–2.4)

## 2021-07-27 LAB — HEPATITIS B CORE ANTIBODY, TOTAL: Hep B Core Total Ab: NONREACTIVE

## 2021-07-27 LAB — HEPATITIS A ANTIBODY, TOTAL: hep A Total Ab: REACTIVE — AB

## 2021-07-27 LAB — HIV ANTIBODY (ROUTINE TESTING W REFLEX): HIV Screen 4th Generation wRfx: NONREACTIVE

## 2021-07-27 LAB — HEPATITIS C ANTIBODY: HCV Ab: NONREACTIVE

## 2021-07-27 MED ORDER — POTASSIUM CHLORIDE 10 MEQ/100ML IV SOLN
10.0000 meq | INTRAVENOUS | Status: AC
  Administered 2021-07-27 (×6): 10 meq via INTRAVENOUS
  Filled 2021-07-27 (×6): qty 100

## 2021-07-27 MED ORDER — BISOPROLOL FUMARATE 10 MG PO TABS
10.0000 mg | ORAL_TABLET | Freq: Every day | ORAL | Status: DC
  Administered 2021-07-27 – 2021-08-04 (×9): 10 mg via ORAL
  Filled 2021-07-27 (×10): qty 1

## 2021-07-27 MED ORDER — LOSARTAN POTASSIUM 50 MG PO TABS
100.0000 mg | ORAL_TABLET | Freq: Every day | ORAL | Status: DC
  Administered 2021-07-28 – 2021-08-04 (×8): 100 mg via ORAL
  Filled 2021-07-27 (×8): qty 2

## 2021-07-27 MED ORDER — INSULIN GLARGINE-YFGN 100 UNIT/ML ~~LOC~~ SOLN
7.0000 [IU] | Freq: Two times a day (BID) | SUBCUTANEOUS | Status: DC
  Administered 2021-07-27 – 2021-08-04 (×16): 7 [IU] via SUBCUTANEOUS
  Filled 2021-07-27 (×20): qty 0.07

## 2021-07-27 MED ORDER — QUETIAPINE FUMARATE 25 MG PO TABS
25.0000 mg | ORAL_TABLET | Freq: Every evening | ORAL | Status: DC | PRN
  Administered 2021-07-27: 25 mg via ORAL
  Filled 2021-07-27: qty 1

## 2021-07-27 MED ORDER — AMLODIPINE BESYLATE 10 MG PO TABS
10.0000 mg | ORAL_TABLET | Freq: Every day | ORAL | Status: DC
  Administered 2021-07-27 – 2021-08-14 (×18): 10 mg via ORAL
  Filled 2021-07-27 (×18): qty 1

## 2021-07-27 MED ORDER — DOCUSATE SODIUM 100 MG PO CAPS
100.0000 mg | ORAL_CAPSULE | Freq: Two times a day (BID) | ORAL | Status: DC
  Administered 2021-07-28 – 2021-08-04 (×11): 100 mg via ORAL
  Filled 2021-07-27 (×16): qty 1

## 2021-07-27 MED ORDER — LOSARTAN POTASSIUM 50 MG PO TABS: 100.0000 mg | ORAL_TABLET | Freq: Every day | ORAL | Status: DC

## 2021-07-27 MED ORDER — GUAIFENESIN 100 MG/5ML PO LIQD: 15.0000 mL | ORAL | Status: DC

## 2021-07-27 MED ORDER — POLYETHYLENE GLYCOL 3350 17 G PO PACK
17.0000 g | PACK | Freq: Every day | ORAL | Status: DC
  Administered 2021-07-28 – 2021-07-31 (×4): 17 g via ORAL
  Filled 2021-07-27 (×4): qty 1

## 2021-07-27 MED ORDER — FENTANYL CITRATE (PF) 100 MCG/2ML IJ SOLN
25.0000 ug | INTRAMUSCULAR | Status: DC | PRN
  Administered 2021-07-27 (×2): 25 ug via INTRAVENOUS
  Administered 2021-07-28: 50 ug via INTRAVENOUS
  Administered 2021-07-29: 13:00:00 25 ug via INTRAVENOUS
  Administered 2021-07-29 – 2021-08-12 (×4): 50 ug via INTRAVENOUS
  Filled 2021-07-27 (×8): qty 2

## 2021-07-27 MED ORDER — GUAIFENESIN 100 MG/5ML PO LIQD
15.0000 mL | ORAL | Status: DC
  Administered 2021-07-27 – 2021-08-04 (×30): 15 mL via ORAL
  Filled 2021-07-27 (×31): qty 15

## 2021-07-27 NOTE — Progress Notes (Signed)
SLP Cancellation Note  Patient Details Name: Matthew Dominguez MRN: [id number] DOB: 1951-06-27   Cancelled treatment:        Orders for clinical swallow evaluation received and appreciated.  Spoke with RN pt is only cleared for ice chips, sips, and meds.  Pt passed nursing swallow screen and has taken medication without difficulty.  SLP will follow to determine if pt has any swallowing needs once he is able to take POs.    Celedonio Savage, [place], [place] Office: [phone]  07/27/2021, 1:15 PM

## 2021-07-27 NOTE — Progress Notes (Signed)
Trauma/Critical Care Follow Up Note  Subjective:    Overnight Issues:   Objective:  Vital signs for last 24 hours: Temp:  [98.4 F (36.9 C)-101.2 F (38.4 C)] 98.4 F (36.9 C) (11/20 0724) Pulse Rate:  [109-121] 115 (11/20 0600) Resp:  [19-31] 25 (11/20 0600) BP: (126-159)/(50-72) 145/62 (11/20 0600) SpO2:  [86 %-100 %] 93 % (11/20 0749) Arterial Line BP: (110-184)/(46-85) 162/54 (11/20 0600) FiO2 (%):  [55 %] 55 % (11/20 0400)  Hemodynamic parameters for last 24 hours:    Intake/Output from previous day: 11/19 0701 - 11/20 0700 In: 3610 [I.V.:2869.4; IV Piggyback:740.6] Out: 8502 [Urine:1170; Drains:585]  Intake/Output this shift: No intake/output data recorded.  Vent settings for last 24 hours: FiO2 (%):  [55 %] 55 %  Physical Exam:  Gen: comfortable, no distress Neuro: non-focal exam HEENT: PERRL Neck: supple CV: RRR Pulm: unlabored breathing Abd: soft, NT, midline wound healthy, dressing changed by me this AM GU: clear yellow urine Extr: wwp, no edema   Results for orders placed or performed during the hospital encounter of 07/25/21 (from the past 24 hour(s))  I-STAT 7, (LYTES, BLD GAS, ICA, H+H)     Status: Abnormal   Collection Time: 07/26/21 11:04 AM  Result Value Ref Range   pH, Arterial 7.377 7.350 - 7.450   pCO2 arterial 44.8 32.0 - 48.0 mmHg   pO2, Arterial 97 83.0 - 108.0 mmHg   Bicarbonate 26.3 20.0 - 28.0 mmol/L   TCO2 28 22 - 32 mmol/L   O2 Saturation 97.0 %   Acid-Base Excess 1.0 0.0 - 2.0 mmol/L   Sodium 143 135 - 145 mmol/L   Potassium 3.4 (L) 3.5 - 5.1 mmol/L   Calcium, Ion 1.24 1.15 - 1.40 mmol/L   HCT 38.0 (L) 39.0 - 52.0 %   Hemoglobin 12.9 (L) 13.0 - 17.0 g/dL   Patient temperature 98.6 F    Collection site art line    Drawn by RT    Sample type ARTERIAL   Glucose, capillary     Status: Abnormal   Collection Time: 07/26/21 11:11 AM  Result Value Ref Range   Glucose-Capillary 270 (H) 70 - 99 mg/dL  Glucose, capillary      Status: Abnormal   Collection Time: 07/26/21  3:27 PM  Result Value Ref Range   Glucose-Capillary 216 (H) 70 - 99 mg/dL  Glucose, capillary     Status: Abnormal   Collection Time: 07/26/21  7:34 PM  Result Value Ref Range   Glucose-Capillary 215 (H) 70 - 99 mg/dL  Glucose, capillary     Status: Abnormal   Collection Time: 07/27/21 12:20 AM  Result Value Ref Range   Glucose-Capillary 182 (H) 70 - 99 mg/dL  I-STAT 7, (LYTES, BLD GAS, ICA, H+H)     Status: Abnormal   Collection Time: 07/27/21  3:50 AM  Result Value Ref Range   pH, Arterial 7.426 7.350 - 7.450   pCO2 arterial 38.8 32.0 - 48.0 mmHg   pO2, Arterial 47 (L) 83.0 - 108.0 mmHg   Bicarbonate 25.6 20.0 - 28.0 mmol/L   TCO2 27 22 - 32 mmol/L   O2 Saturation 84.0 %   Acid-Base Excess 1.0 0.0 - 2.0 mmol/L   Sodium 145 135 - 145 mmol/L   Potassium 3.2 (L) 3.5 - 5.1 mmol/L   Calcium, Ion 1.28 1.15 - 1.40 mmol/L   HCT 35.0 (L) 39.0 - 52.0 %   Hemoglobin 11.9 (L) 13.0 - 17.0 g/dL   Patient temperature 98.3  F    Collection site Magazine features editor by Operator    Sample type ARTERIAL   Glucose, capillary     Status: Abnormal   Collection Time: 07/27/21  4:10 AM  Result Value Ref Range   Glucose-Capillary 167 (H) 70 - 99 mg/dL  Glucose, capillary     Status: Abnormal   Collection Time: 07/27/21  4:15 AM  Result Value Ref Range   Glucose-Capillary 160 (H) 70 - 99 mg/dL  CBC     Status: Abnormal   Collection Time: 07/27/21  4:20 AM  Result Value Ref Range   WBC 16.8 (H) 4.0 - 10.5 K/uL   RBC 4.12 (L) 4.22 - 5.81 MIL/uL   Hemoglobin 12.5 (L) 13.0 - 17.0 g/dL   HCT 37.5 (L) 39.0 - 52.0 %   MCV 91.0 80.0 - 100.0 fL   MCH 30.3 26.0 - 34.0 pg   MCHC 33.3 30.0 - 36.0 g/dL   RDW 14.2 11.5 - 15.5 %   Platelets 120 (L) 150 - 400 K/uL   nRBC 0.0 0.0 - 0.2 %  Comprehensive metabolic panel     Status: Abnormal   Collection Time: 07/27/21  4:20 AM  Result Value Ref Range   Sodium 143 135 - 145 mmol/L   Potassium 3.3 (L) 3.5 - 5.1  mmol/L   Chloride 111 98 - 111 mmol/L   CO2 25 22 - 32 mmol/L   Glucose, Bld 185 (H) 70 - 99 mg/dL   BUN 30 (H) 8 - 23 mg/dL   Creatinine, Ser 0.86 0.61 - 1.24 mg/dL   Calcium 8.3 (L) 8.9 - 10.3 mg/dL   Total Protein 5.2 (L) 6.5 - 8.1 g/dL   Albumin <1.5 (L) 3.5 - 5.0 g/dL   AST 43 (H) 15 - 41 U/L   ALT 103 (H) 0 - 44 U/L   Alkaline Phosphatase 127 (H) 38 - 126 U/L   Total Bilirubin 2.1 (H) 0.3 - 1.2 mg/dL   GFR, Estimated >60 >60 mL/min   Anion gap 7 5 - 15  Lipase, blood     Status: None   Collection Time: 07/27/21  4:20 AM  Result Value Ref Range   Lipase 36 11 - 51 U/L  I-STAT 7, (LYTES, BLD GAS, ICA, H+H)     Status: Abnormal   Collection Time: 07/27/21  5:45 AM  Result Value Ref Range   pH, Arterial 7.372 7.350 - 7.450   pCO2 arterial 44.2 32.0 - 48.0 mmHg   pO2, Arterial 73 (L) 83.0 - 108.0 mmHg   Bicarbonate 25.3 20.0 - 28.0 mmol/L   TCO2 27 22 - 32 mmol/L   O2 Saturation 93.0 %   Acid-Base Excess 0.0 0.0 - 2.0 mmol/L   Sodium 145 135 - 145 mmol/L   Potassium 3.2 (L) 3.5 - 5.1 mmol/L   Calcium, Ion 1.28 1.15 - 1.40 mmol/L   HCT 34.0 (L) 39.0 - 52.0 %   Hemoglobin 11.6 (L) 13.0 - 17.0 g/dL   Patient temperature 101.2 F    Sample type ARTERIAL   Glucose, capillary     Status: Abnormal   Collection Time: 07/27/21  7:24 AM  Result Value Ref Range   Glucose-Capillary 181 (H) 70 - 99 mg/dL    Assessment & Plan: The plan of care was discussed with the bedside nurse for the day, who is in agreement with this plan and no additional concerns were raised.   Present on Admission:  Pyogenic liver abscess  Essential hypertension, benign  COPD (chronic obstructive pulmonary disease) (HCC)  GERD (gastroesophageal reflux disease)    LOS: 1 day   Additional comments:I reviewed the patient's new clinical lab test results.   and I reviewed the patients new imaging test results.    Pneumoperitoneum - s/p exlap 11/18 by Dr. Dema Severin, determined to be 2/2 liver abscess. Continue  drains-585/24h S>S. WOC c/s for vac placement to midline 11/21.On cefepime/flagyl day 2 per ICU team.  FEN - pulled NG out twice yest, okay to leave out, okay for sips/chips DVT - SCDs, LMWH Dispo - per ICU team    Jesusita Oka, MD Trauma & General Surgery Please use [email or web address] to contact on call provider  07/27/2021  *Care during the described time interval was provided by me. I have reviewed this patient's available data, including medical history, events of note, physical examination and test results as part of my evaluation.

## 2021-07-27 NOTE — Progress Notes (Signed)
K+3.3 ?Replaced per protocol  ?

## 2021-07-27 NOTE — Progress Notes (Signed)
[facility] Progress Note Patient Name: Matthew Dominguez DOB: [birth year] MRN: [id number]   Date of Service  07/27/2021  HPI/Events of Note  Agitation - With recent abdominal surgery might also have component of pain.   eICU Interventions  Plan: ABG STAT. Fentanyl 25-50 mcg IV Q 2 hours PRN pain.      Intervention Category Major Interventions: Delirium, psychosis, severe agitation - evaluation and management  Dohn Stclair Eugene 07/27/2021, 3:36 AM

## 2021-07-27 NOTE — Progress Notes (Signed)
[facility] Progress Note Patient Name: ARNOL MCGIBBON DOB: 02-Dec-1950 MRN: [id number]   Date of Service  07/27/2021  HPI/Events of Note  ABG on 30% Venturi Mask = 7.426/38.8/47/25.6. FiO2 increased to 55% with Sat now = 98%.   eICU Interventions  Plan: Portable CXR now.      Intervention Category Major Interventions: Hypoxemia - evaluation and management  Gianlucas Evenson Eugene 07/27/2021, 4:23 AM

## 2021-07-27 NOTE — Consult Note (Signed)
[facility]    Date of Admission:  07/25/2021   Total days of inpatient antibiotics 3        Reason for Consult: 3Liver abscess   Principal Problem:   Pyogenic liver abscess Active Problems:   Diabetes mellitus type 2, controlled ([place])   Essential hypertension, benign   COPD (chronic obstructive pulmonary disease) (HCC)   GERD (gastroesophageal reflux disease)   Encounter for postanesthesia care   Assessment: 70 year old male with diabetes poorly controlled, COPD, mild cognitive impairment admitted for pneumoperitoneum secondary to ruptured liver abscess.  CT showed pneumoperitoneum, cholelithiasis, fatty liver.  On arrival LFTs elevated with a T bili of 3.5.  ID consult for antibiotic recommendations.  #Pneumoperitoneum 2/2 ruptured pyogenic liver abscess SP Ex lap on 11/18 #uncontrolled DM A1c 12.5 on 12/18/20 #Chronic fatty liver disease, NASH: LFTs consistently elevated since 2017 #Remote Hx of IVDA(last use 1974) -Liver abscesses usually followed by protal vein infection especially in the setting of peritonitis. In this case I suspect it is 2/2 biliary tract disease. Given pt's Hx of gallstones, this is likely the etiology. Would consider doing MRCP.  It could be secondary to staph or strep bacteremia, Gram-negative bacteremia include klebseilla. as well. Blood cultures pending  Recommendations:  -Continue cefepime and metronidazole -TTE -Agree with abdominal U/S, consider MRCP to evaluate biliary tract -Pending Ultrasound results consider engaging GI -Follow 11/19 blood Cx -Anticipate atleast 4 weeks of antibiotics -Acute hep panel, HIV, RPR Microbiology:   Antibiotics: Cefepime and metronidazole 11/18-p Cultures: Blood Cx: 11/18 pending  HPI: Matthew Dominguez is a 71 y.o. male hypertension, diabetes mellitus, COPD, hypothyroidism, mild cognitive impairment brought into the ED after being found down at home by EMS.  He had reported 1  day history of abdominal pain with associated nausea.  Patient was having belligerent outbursts noted in H&P.  On arrival WBC 25.7K, patient afebrile, CT showed  pneumoperitoneum and small ascites in the upper abdomen possibly related to gastric perforation or rupture, cholelithiasis, fatty liver.Started on cefepime and metronidazole. Underwent exlap on 11/18 for Dr. Dema Severin determined to 2/2 liver abscess. Wound vac in place. ID consulted per etiology of liver abscess Today, pt is resting in bed. He is unable to provide history, previously thought it was 39. I spoke to his wife and daughter over the phone. They report pt had a remote Hx of of IVDA(last use 1974), No foreign travel, has not served in the armed forces.  Review of Systems: Review of Systems  All other systems reviewed and are negative.  Past Medical History:  Diagnosis Date   Abnormalities of the hair    Allergic rhinitis, cause unspecified    Allergy    Anxiety    Arthritis    Asthma    Backache, unspecified    Broken ribs 11/2015   Cataract    Cervicalgia    Colon polyps    COPD (chronic obstructive pulmonary disease) ([place])    Depression    Diabetes mellitus    Encounter for long-term (current) use of other medications    Family history of breast cancer    Full dentures    GERD (gastroesophageal reflux disease)    Hypertension    Hypertrophy of prostate without urinary obstruction and other lower urinary tract symptoms (LUTS)    Incomplete bladder emptying    Insomnia, unspecified    Neoplasm of uncertain behavior of skin    Nystagmus, unspecified  Other and unspecified hyperlipidemia    Other premature beats    Pyogenic granuloma of skin and subcutaneous tissue    Seasonal allergies    Special screening for malignant neoplasm of prostate    Tachycardia, unspecified    Type I (juvenile type) diabetes mellitus without mention of complication, uncontrolled    Unspecified arthropathy, shoulder region     Unspecified asthma(493.90)    Unspecified essential hypertension    Unspecified hypothyroidism    Urinary frequency    Wears glasses     Social History   Tobacco Use   Smoking status: Former    Packs/day: 1.00    Years: 40.00    Pack years: 40.00    Types: Cigarettes    Quit date: 10/28/2003    Years since quitting: 17.7   Smokeless tobacco: Current    Types: Snuff  Vaping Use   Vaping Use: Never used  Substance Use Topics   Alcohol use: No    Alcohol/week: 0.0 standard drinks   Drug use: No    Family History  Problem Relation Age of Onset   Cancer Sister        BREAST   Pancreatic cancer Maternal Aunt    Diabetes Sister    Colon polyps Brother    Diabetes Brother    Cancer Mother    Pneumonia Mother    Colon cancer Neg Hx    Scheduled Meds:  amLODipine  10 mg Oral Daily   bisoprolol  10 mg Oral Daily   budesonide (PULMICORT) nebulizer solution  0.5 mg Nebulization BID   Chlorhexidine Gluconate Cloth  6 each Topical Q0600   docusate  100 mg Per Tube BID   enoxaparin (LOVENOX) injection  40 mg Subcutaneous Daily   fentaNYL (SUBLIMAZE) injection  25 mcg Intravenous Once   guaiFENesin  15 mL Per Tube Q4H   insulin aspart  0-15 Units Subcutaneous Q4H   insulin glargine-yfgn  7 Units Subcutaneous BID   ipratropium-albuterol  3 mL Nebulization TID   [START ON 07/28/2021] losartan  100 mg Per Tube Daily   mouth rinse  15 mL Mouth Rinse BID   polyethylene glycol  17 g Per Tube Daily   Continuous Infusions:  sodium chloride Stopped (07/27/21 0541)   ceFEPime (MAXIPIME) IV 200 mL/hr at 07/27/21 0600   clevidipine 16 mg/hr (07/27/21 0950)   famotidine (PEPCID) IV 20 mg (07/27/21 1018)   lactated ringers Stopped (07/27/21 0540)   metronidazole 100 mL/hr at 07/27/21 0600   potassium chloride 10 mEq (07/27/21 0934)   PRN Meds:.sodium chloride, acetaminophen, albuterol, docusate, fentaNYL (SUBLIMAZE) injection, hydrALAZINE, labetalol, ondansetron (ZOFRAN) IV,  polyethylene glycol No Known Allergies  OBJECTIVE: Blood pressure (!) 145/62, pulse (!) 115, temperature 98.4 F (36.9 C), temperature source Oral, resp. rate (!) 25, height 5\' 7"  (1.702 m), weight 81.4 kg, SpO2 93 %.  Physical Exam Constitutional:      General: He is not in acute distress.    Appearance: He is normal weight. He is not toxic-appearing.  HENT:     Head: Normocephalic and atraumatic.     Right Ear: External ear normal.     Left Ear: External ear normal.     Nose: No congestion or rhinorrhea.     Mouth/Throat:     Mouth: Mucous membranes are moist.     Pharynx: Oropharynx is clear.  Eyes:     Extraocular Movements: Extraocular movements intact.     Conjunctiva/sclera: Conjunctivae normal.     Pupils: Pupils  are equal, round, and reactive to light.  Cardiovascular:     Rate and Rhythm: Normal rate and regular rhythm.     Heart sounds: No murmur heard.   No friction rub. No gallop.  Pulmonary:     Effort: Pulmonary effort is normal.     Breath sounds: Normal breath sounds.  Abdominal:     General: Abdomen is flat. Bowel sounds are normal.     Palpations: Abdomen is soft.  Musculoskeletal:        General: No swelling. Normal range of motion.     Cervical back: Normal range of motion and neck supple.  Skin:    General: Skin is warm and dry.  Neurological:     General: No focal deficit present.  Psychiatric:        Mood and Affect: Mood normal.    Lab Results Lab Results  Component Value Date   WBC 16.8 (H) 07/27/2021   HGB 11.6 (L) 07/27/2021   HCT 34.0 (L) 07/27/2021   MCV 91.0 07/27/2021   PLT 120 (L) 07/27/2021    Lab Results  Component Value Date   CREATININE 0.86 07/27/2021   BUN 30 (H) 07/27/2021   NA 145 07/27/2021   K 3.2 (L) 07/27/2021   CL 111 07/27/2021   CO2 25 07/27/2021    Lab Results  Component Value Date   ALT 103 (H) 07/27/2021   AST 43 (H) 07/27/2021   ALKPHOS 127 (H) 07/27/2021   BILITOT 2.1 (H) 07/27/2021        Laurice Record, MD [facility] 07/27/2021, 10:33 AM fg

## 2021-07-27 NOTE — Progress Notes (Signed)
[facility] Progress Note Patient Name: Matthew Dominguez DOB: Feb 26, 1951 MRN: [id number]   Date of Service  07/27/2021  HPI/Events of Note  CXR reveals: 1. A possible developing left infrahilar infiltrate or atelectasis. 2. Cardiomegaly, stable mild central vascular fullness.  No edema. 3. Interval extubation, removal of NGT.  eICU Interventions  Plan: Incentive spirometry Q 1 hour while awake.      Intervention Category Major Interventions: Hypoxemia - evaluation and management  [name] [name] 07/27/2021, 5:34 AM

## 2021-07-27 NOTE — Progress Notes (Addendum)
NAME:  Matthew Dominguez MRN:  [id number] DOB:  12/12/1950 LOS: 1 ADMISSION DATE:  07/25/2021    Brief History   Matthew Dominguez is a 70 year old male currently hospital day #3 admitted to the ICU with sepsis and hepatitis secondary to ruptured pyogenic liver abscesses.  He is s/p ex lap and drainage of liver abscesses and partial omentectomy 11/18. Extubated successfully 11/19.  Passed bedside swallow 11/20, okay for sips and chips by surgery.  Interim history/subjective:  Patient awake with eyes open, alert, responds to voice.  Unclear speech, however may be near his baseline.  Significant Hospital Events   11/18: went from ER to OR for exploratory laparotomy and drainage of pyogenic liver abscesses (2) and partial omentectomy 11/19: ID consulted, Extubated 11/20: Passed bedside swallow  Procedures:  11/18 STAT Exploratory laparotomy and drainage of pyogenic liver abscesses (2) and partial omentectomy  Significant Diagnostic Tests:  11/18 CXR: negative Left hip XR: negative Right hip XR: negative CT head: negative for acute abnormality CT C-spine: no acute displaced fracture or traumatic listhesis  CT abd: pneumoperitoneum and small ascites in the upper abdomen possibly related to gastric perforation or rupture, cholelithiasis, fatty liver, enlarged prostate 11/19 CXR: negative  Antimicrobials:  Cefepime (11/18 - ) Flagyl (11/18 -  )  Objective   BP (!) 145/62   Pulse (!) 115   Temp 98.4 F (36.9 C) (Oral)   Resp (!) 25   Ht 5\' 7"  (1.702 m)   Wt 81.4 kg   SpO2 93%   BMI 28.10 kg/m     Filed Weights   07/25/21 1529 07/26/21 0100  Weight: 84 kg 81.4 kg    Intake/Output Summary (Last 24 hours) at 07/27/2021 1009 Last data filed at 07/27/2021 0600 Gross per 24 hour  Intake 3247.09 ml  Output 1665 ml  Net 1582.09 ml    FiO2 (%):  [55 %] 55 %   Examination: General: Awake, alert, appears to be at baseline Cardiac: Regular rate and rhythm, no murmur  appreciated Respiratory: CTA B Extremities: No BLE edema  Resolved Hospital Problem list   Acute hypoxic hypercapnic respiratory failure  Assessment & Plan:  Sepsis  Ruptured pyogenic liver abscesses x2 s/p surgical intervention  WBC trending down, 12.5 this morning.  Fevered overnight x1 to 101.2 F, resolved quickly. -Continue empiric cefepime and Flagyl -ID consulted, appreciate recommendations -Sips and chips per surgery -SLP swallow study  Hepatitis secondary to ruptured pyogenic liver abscesses LFTs improving this morning. - CMP daily - Consider hepatitis panel if no improvement  Hypertension Continues to be hypertensive, was on Cleviprex overnight.  Aim to wean from Cleviprex with p.o. meds. -Increase home losartan from 50 mg to 100 mg - Start home bisoprolol 10 mg - Start amlodipine 10 mg  T2DM Glucose overnight 160-185. - Increase basal insulin Semglee from 5 units to 7 units twice daily - CBG every 4 - Continue moderate sliding scale insulin  Dementia  delirium  agitation Patient pulled out NG tube twice yesterday afternoon.  Surgery okayed to leave it out.  Was placed in soft wrist restraints overnight for agitation that would not allow IV meds to infuse. - Delirium precautions - Seroquel 25 mg nightly as needed for agitation  COPD  - Continue scheduled DuoNeb  GERD -Continue Protonix  Best practice:  Diet: Pending swallow study s/p extubation Pain/Anxiety/Delirium protocol (if indicated): propofol/fentanyl VAP protocol (if indicated): YES DVT prophylaxis: Lovenox GI prophylaxis: Protonix Glucose control: sliding scale  CODE: FULL  Matthew Essex, MD Family Medicine PGY-2 [facility]    Critical care attending attestation note:  Patient seen and examined and relevant ancillary tests reviewed.  I agree with the assessment and plan of care as outlined by Matthew Essex, MD. The following reflects my independent critical care  time.   Synopsis of assessment and plan: 70 year old male with pneumoperitoneum due to ruptured pyogenic liver abscesses s/p ex lap  Patient was successfully extubated yesterday, still remain hypertensive on Cleviprex infusion, was agitated overnight requiring few fentanyl pushes   Physical exam: General: Critically ill-appearing male, lying in the bed HEENT: /AT, eyes anicteric.  Moist mucous membranes Neuro: Lethargic, opens eyes with vocal stimuli, not following commands, track examiner Chest: Coarse breath sounds, no wheezes or rhonchi Heart: Regular rate and rhythm, no murmurs or gallops Abdomen: Soft, laparotomy incision looks clean, nondistended Skin: No rash  Labs and images were reviewed  Assessment and plan: Sepsis due to ruptured pyogenic abscesses Pneumoperitoneum status post ex lap Acute bacterial hepatitis due to liver abscesses Uncontrolled hypertension Poorly controlled diabetes type 2 with hyperglycemia Acute hyperactive delirium on top of dementia COPD, not in exacerbation Acute hypoxic/hypercapnic respiratory failure, resolved Hypokalemia  Sepsis indices are improving Continue IV antibiotics Infectious diseases consulted, pending their input Continue cefepime and Flagyl General surgery is following LFTs are improving, continue to trend Blood pressure remains uncontrolled requiring Cleviprex infusion Resume home meds, try to taper off Cleviprex Monitor fingerstick, he remain hyperglycemic increase Lantus to 22 units twice daily, continue sliding scale insulin Started on Seroquel Patient is on room air Continue electrolyte supplement    CRITICAL CARE Performed by: Jacky Kindle   Total independent critical care time: 38 minutes  Critical care time was exclusive of separately billable procedures and treating other patients.  Critical care was necessary to treat or prevent imminent or life-threatening deterioration.   Critical care was time  spent personally by me on the following activities: development of treatment plan with patient and/or surrogate as well as nursing, discussions with consultants, evaluation of patient's response to treatment, examination of patient, obtaining history from patient or surrogate, ordering and performing treatments and interventions, ordering and review of laboratory studies, ordering and review of radiographic studies, pulse oximetry, re-evaluation of patient's condition and participation in multidisciplinary rounds.  Jacky Kindle MD [facility] See Amion for pager If no response to pager, please call [phone] until 7pm After 7pm, Please call E-link [phone]  07/27/2021, 3:20 PM

## 2021-07-28 ENCOUNTER — Inpatient Hospital Stay (HOSPITAL_COMMUNITY): Payer: Medicare HMO

## 2021-07-28 ENCOUNTER — Other Ambulatory Visit: Payer: Self-pay | Admitting: Internal Medicine

## 2021-07-28 ENCOUNTER — Encounter (HOSPITAL_COMMUNITY): Payer: Self-pay | Admitting: Surgery

## 2021-07-28 DIAGNOSIS — I10 Essential (primary) hypertension: Secondary | ICD-10-CM | POA: Diagnosis not present

## 2021-07-28 DIAGNOSIS — K750 Abscess of liver: Secondary | ICD-10-CM | POA: Diagnosis not present

## 2021-07-28 DIAGNOSIS — J9601 Acute respiratory failure with hypoxia: Secondary | ICD-10-CM | POA: Diagnosis not present

## 2021-07-28 LAB — COMPREHENSIVE METABOLIC PANEL
ALT: 78 U/L — ABNORMAL HIGH (ref 0–44)
AST: 45 U/L — ABNORMAL HIGH (ref 15–41)
Albumin: <1.5 g/dL — ABNORMAL LOW (ref 3.5–5.0)
Alkaline Phosphatase: 108 U/L (ref 38–126)
Anion gap: 5 (ref 5–15)
BUN: 24 mg/dL — ABNORMAL HIGH (ref 8–23)
CO2: 25 mmol/L (ref 22–32)
Calcium: 7.9 mg/dL — ABNORMAL LOW (ref 8.9–10.3)
Chloride: 112 mmol/L — ABNORMAL HIGH (ref 98–111)
Creatinine, Ser: 0.74 mg/dL (ref 0.61–1.24)
GFR, Estimated: >60 mL/min (ref >60)
Glucose, Bld: 175 mg/dL — ABNORMAL HIGH (ref 70–99)
Potassium: 3.6 mmol/L (ref 3.5–5.1)
Sodium: 142 mmol/L (ref 135–145)
Total Bilirubin: 1.3 mg/dL — ABNORMAL HIGH (ref 0.3–1.2)
Total Protein: 5.1 g/dL — ABNORMAL LOW (ref 6.5–8.1)

## 2021-07-28 LAB — HEPATITIS B SURFACE ANTIBODY, QUANTITATIVE: Hep B S AB Quant (Post): <3.1 m[IU]/mL — ABNORMAL LOW (ref >9.9)

## 2021-07-28 LAB — CULTURE, RESPIRATORY W GRAM STAIN
Culture: NORMAL
Gram Stain: NONE SEEN
Special Requests: NORMAL

## 2021-07-28 LAB — CBC
HCT: 36.4 % — ABNORMAL LOW (ref 39.0–52.0)
Hemoglobin: 12 g/dL — ABNORMAL LOW (ref 13.0–17.0)
MCH: 30.5 pg (ref 26.0–34.0)
MCHC: 33 g/dL (ref 30.0–36.0)
MCV: 92.6 fL (ref 80.0–100.0)
Platelets: 148 10*3/uL — ABNORMAL LOW (ref 150–400)
RBC: 3.93 MIL/uL — ABNORMAL LOW (ref 4.22–5.81)
RDW: 14.5 % (ref 11.5–15.5)
WBC: 14.9 10*3/uL — ABNORMAL HIGH (ref 4.0–10.5)
nRBC: 0 % (ref 0.0–0.2)

## 2021-07-28 LAB — GLUCOSE, CAPILLARY
Glucose-Capillary: 119 mg/dL — ABNORMAL HIGH (ref 70–99)
Glucose-Capillary: 131 mg/dL — ABNORMAL HIGH (ref 70–99)
Glucose-Capillary: 132 mg/dL — ABNORMAL HIGH (ref 70–99)
Glucose-Capillary: 136 mg/dL — ABNORMAL HIGH (ref 70–99)
Glucose-Capillary: 137 mg/dL — ABNORMAL HIGH (ref 70–99)
Glucose-Capillary: 161 mg/dL — ABNORMAL HIGH (ref 70–99)
Glucose-Capillary: 163 mg/dL — ABNORMAL HIGH (ref 70–99)

## 2021-07-28 LAB — ECHOCARDIOGRAM COMPLETE BUBBLE STUDY
AV Mean grad: 6 mmHg
AV Peak grad: 11.2 mmHg
Ao pk vel: 1.67 m/s
Area-P 1/2: 3.46 cm2

## 2021-07-28 LAB — MAGNESIUM: Magnesium: 2.1 mg/dL (ref 1.7–2.4)

## 2021-07-28 LAB — RPR: RPR Ser Ql: NONREACTIVE

## 2021-07-28 MED ORDER — PERFLUTREN LIPID MICROSPHERE
1.0000 mL | INTRAVENOUS | Status: AC | PRN
  Administered 2021-07-28: 2 mL via INTRAVENOUS
  Filled 2021-07-28: qty 10

## 2021-07-28 MED ORDER — QUETIAPINE FUMARATE 25 MG PO TABS
25.0000 mg | ORAL_TABLET | Freq: Every day | ORAL | Status: DC
  Administered 2021-07-28 – 2021-08-09 (×8): 25 mg via ORAL
  Filled 2021-07-28 (×9): qty 1

## 2021-07-28 MED ORDER — CHLORHEXIDINE GLUCONATE CLOTH 2 % EX PADS
6.0000 | MEDICATED_PAD | Freq: Every day | CUTANEOUS | Status: DC
  Administered 2021-07-29 – 2021-08-14 (×18): 6 via TOPICAL

## 2021-07-28 NOTE — Progress Notes (Signed)
3 Days Post-Op  Subjective: CC: Patient only orientated to self Passed nursing swallow screen yesterday. RN reports that he has only taken sips with meds. No known flatus or bm. Distended this am. He denies abdominal pain or nausea. No recorded emesis  Objective: Vital signs in last 24 hours: Temp:  [98.4 F (36.9 C)-99.3 F (37.4 C)] 99.3 F (37.4 C) (11/21 0715) Pulse Rate:  [78-109] 88 (11/21 0800) Resp:  [18-29] 22 (11/21 0800) BP: (77-176)/(42-88) 92/49 (11/21 0000) SpO2:  [89 %-95 %] 92 % (11/21 0800) Arterial Line BP: (144-185)/(43-56) 172/54 (11/21 0800) Weight:  [85.9 kg] 85.9 kg (11/21 0500) Last BM Date:  (pta) T max 99.3 HR max 108. Currently in 80's.  Cuff pressure 92/49 but A-Line pressure 177/53 on Cleviprex  Intake/Output from previous day: 11/20 0701 - 11/21 0700 In: 4861.5 [I.V.:3527.9; IV Piggyback:1333.6] Out: 1545 [Urine:955; Drains:590]  Intake/Output this shift: Total I/O In: -  Out: 225 [Urine:125; Drains:100]  PE: Gen:  Awake and alert in NAD  Card:  Reg Pulm:  Normal rate and effort Abd: Distended but soft, generalized tenderness greatest in the upper abdomen. Hypoactive bowel sounds. Midline wound clean and without evidence of dehisence as noted below. RUQ JP drain with pale yellow fluid in bulb (495/24 hours + 100cc this am). LUQ drain with pale yellow/golden tinged fluid in bulb (see picture below - 95cc/24 hours) Ext:  No LE edema  Psych: A&Ox1      Lab Results:  Recent Labs    07/27/21 0420 07/27/21 0545 07/28/21 0425  WBC 16.8*  --  14.9*  HGB 12.5* 11.6* 12.0*  HCT 37.5* 34.0* 36.4*  PLT 120*  --  148*   BMET Recent Labs    07/27/21 0420 07/27/21 0545 07/28/21 0425  NA 143 145 142  K 3.3* 3.2* 3.6  CL 111  --  112*  CO2 25  --  25  GLUCOSE 185*  --  175*  BUN 30*  --  24*  CREATININE 0.86  --  0.74  CALCIUM 8.3*  --  7.9*   PT/INR Recent Labs    07/25/21 1647  LABPROT 15.9*  INR 1.3*   CMP      Component Value Date/Time   NA 142 07/28/2021 0425   NA 142 01/15/2016 1031   K 3.6 07/28/2021 0425   CL 112 (H) 07/28/2021 0425   CO2 25 07/28/2021 0425   GLUCOSE 175 (H) 07/28/2021 0425   BUN 24 (H) 07/28/2021 0425   BUN 10 01/15/2016 1031   CREATININE 0.74 07/28/2021 0425   CREATININE 0.74 04/18/2021 1353   CALCIUM 7.9 (L) 07/28/2021 0425   PROT 5.1 (L) 07/28/2021 0425   PROT 6.2 01/15/2016 1031   ALBUMIN <1.5 (L) 07/28/2021 0425   ALBUMIN 4.1 01/15/2016 1031   AST 45 (H) 07/28/2021 0425   ALT 78 (H) 07/28/2021 0425   ALKPHOS 108 07/28/2021 0425   BILITOT 1.3 (H) 07/28/2021 0425   BILITOT 0.3 01/15/2016 1031   GFRNONAA >60 07/28/2021 0425   GFRNONAA 91 02/04/2021 0959   GFRAA 106 02/04/2021 0959   Lipase     Component Value Date/Time   LIPASE 36 07/27/2021 0420    Studies/Results: DG Abd 1 View  Result Date: 07/26/2021 CLINICAL DATA:  Check gastric catheter placement EXAM: ABDOMEN - 1 VIEW COMPARISON:  None. FINDINGS: Gastric catheter is noted within the stomach. Surgical drains are seen related to recent exploratory laparotomy. No definitive free air is seen. IMPRESSION: Gastric catheter  within the stomach. Electronically Signed   By: Inez Catalina M.D.   On: 07/26/2021 20:23   DG Chest Port 1 View  Result Date: 07/27/2021 CLINICAL DATA:  Follow-up postextubation. EXAM: PORTABLE CHEST 1 VIEW COMPARISON:  Portable chest yesterday at 4:04 a.m. FINDINGS: 4:31 a.m., 07/27/2021. Interval extubation and removal NGT. There is cardiomegaly with stable mild central vascular distension without overt findings of edema. There is increased left infrahilar opacity which could be atelectasis or developing infiltrate. The remaining lungs are clear and no pleural effusion is seen. There are overlying monitor wires and 3 level lower cervical ACDF plating. IMPRESSION: 1. A possible developing left infrahilar infiltrate or atelectasis. 2. Cardiomegaly, stable mild central vascular fullness.   No edema. 3. Interval extubation, removal of NGT. Electronically Signed   By: Telford Nab M.D.   On: 07/27/2021 05:03    Anti-infectives: Anti-infectives (From admission, onward)    Start     Dose/Rate Route Frequency Ordered Stop   07/26/21 1400  ceFEPIme (MAXIPIME) 2 g in sodium chloride 0.9 % 100 mL IVPB        2 g 200 mL/hr over 30 Minutes Intravenous Every 8 hours 07/26/21 0721     07/26/21 0800  ceFEPIme (MAXIPIME) 2 g in sodium chloride 0.9 % 100 mL IVPB  Status:  Discontinued        2 g 200 mL/hr over 30 Minutes Intravenous Every 12 hours 07/25/21 1853 07/26/21 0213   07/26/21 0600  metroNIDAZOLE (FLAGYL) IVPB 500 mg        500 mg 100 mL/hr over 60 Minutes Intravenous Every 8 hours 07/26/21 0119     07/26/21 0600  ceFEPIme (MAXIPIME) 2 g in sodium chloride 0.9 % 100 mL IVPB  Status:  Discontinued        2 g 200 mL/hr over 30 Minutes Intravenous Every 12 hours 07/26/21 0225 07/26/21 0721   07/25/21 1900  ceFEPIme (MAXIPIME) 2 g in sodium chloride 0.9 % 100 mL IVPB        2 g 200 mL/hr over 30 Minutes Intravenous  Once 07/25/21 1845 07/25/21 2015   07/25/21 1900  metroNIDAZOLE (FLAGYL) IVPB 500 mg        500 mg 100 mL/hr over 60 Minutes Intravenous  Once 07/25/21 1845 07/25/21 2138        Assessment/Plan POD 2 s/p Exploratory laparotomy with drainage of pyogenic liver abscess x 2, Partial omentectomy for Ruptured pyogenic liver abscesses by Dr. Dema Severin - 07/25/2021 - Cont IV abx. Appreciate ID's assistance. On Cefepime/Flagyl. They are working up underlying etiology of abscesses. Blood Cx's pending, NGTD. Their note states other source could be portal vein infection 2/2 biliary tract and are obtaining RUQ [place]. Per op note and sign out, gallstones were palpable within the lumen of the gallbladder but the gallbladder is grossly normal. Hepatitis A reactive - Continue drains and monitor output  - Monitor LFT's. Stable/downtrending this am with T. Bili 1.3 - Would keep on sips  with meds. High risk for ileus. If develops worsening distension, emesis etc - would recommend replacing NGT. If develops ileus will need to consider TPN later this week, albumin < 1.5 - Cont WTD BID to midline - Therapies, mobilize - Pulm toilet  FEN - Keep on sips with meds VTE - SCDs, Lovenox ID - Cefepime/Flagyl. T Max 99.3. WBC 14.9 Foley - in place  HTN  - on Cleviprex  Dementia/delirium/agitation - on Seroquel COPD GERD DM2 Prior hx IVDU (1970's)   LOS:  2 days    [facility] 07/28/2021, 8:42 AM Please see Amion for pager number during day hours 7:00am-4:30pm

## 2021-07-28 NOTE — Progress Notes (Addendum)
[facility]  Date of Admission:  07/25/2021   Total days of inpatient antibiotics 3  Principal Problem:   Pyogenic liver abscess Active Problems:   Diabetes mellitus type 2, controlled ([place])   Essential hypertension, benign   COPD (chronic obstructive pulmonary disease) (H[facility])   GERD (gastroesophageal reflux disease)   Encounter for postanesthesia care          Assessment: 70 year old male with diabetes poorly controlled, COPD, mild cognitive impairment admitted for pneumoperitoneum secondary to ruptured liver abscess.  CT showed pneumoperitoneum, cholelithiasis, fatty liver.  On arrival LFTs elevated with a T bili of 3.5.  ID consult for antibiotic recommendations.   #Pneumoperitoneum 2/2 ruptured pyogenic liver abscess SP Ex lap on 11/18 #Uncontrolled DM A1c 12.5 on 12/18/20 #Chronic fatty liver disease, NASH: LFTs consistently elevated since 2017 #Remote Hx of IVDA(last use 1974) -Liver abscesses usually followed by protal vein infection especially in the setting of peritonitis. In this case I suspect it is 2/2 biliary tract disease. Given pt's Hx of gallstones, this is likely the etiology. Would consider doing MRCP.  It could be secondary to staph or strep bacteremia, Gram-negative bacteremia include klebseilla. as well. Blood cultures pending Follow-up:  -HCV NR, HAV immune, HIV Ab negative, RPR negative -TTE negative for vegetation Recommendations:  -Continue cefepime and metronidazole -Follow-up MRCP to evaluate biliary tract, pending results consider engaging GI -Follow 11/19 blood Cx -Anticipate atleast 4 weeks of antibiotics -HBV Ab follow-up  Microbiology:   Antibiotics: Cefepime and metronidazole 11/18-p Cultures: Blood Cx: 11/18 pending     SUBJECTIVE: Afebrile overnight. Resting in bed. He is unable to provide History, incoherent in responding to questions.   Review of Systems: Review of Systems  All other systems  reviewed and are negative.   Scheduled Meds:  amLODipine  10 mg Oral Daily   bisoprolol  10 mg Oral Daily   budesonide (PULMICORT) nebulizer solution  0.5 mg Nebulization BID   Chlorhexidine Gluconate Cloth  6 each Topical Q0600   docusate sodium  100 mg Oral BID   enoxaparin (LOVENOX) injection  40 mg Subcutaneous Daily   guaiFENesin  15 mL Oral Q4H   insulin aspart  0-15 Units Subcutaneous Q4H   insulin glargine-yfgn  7 Units Subcutaneous BID   ipratropium-albuterol  3 mL Nebulization TID   losartan  100 mg Oral Daily   mouth rinse  15 mL Mouth Rinse BID   polyethylene glycol  17 g Oral Daily   QUEtiapine  25 mg Oral QHS   Continuous Infusions:  sodium chloride 10 mL/hr at 07/28/21 1200   ceFEPime (MAXIPIME) IV Stopped (07/28/21 8502)   clevidipine 17 mg/hr (07/28/21 1215)   famotidine (PEPCID) IV Stopped (07/28/21 1028)   lactated ringers 126 mL/hr at 07/28/21 1200   metronidazole Stopped (07/28/21 0635)   PRN Meds:.sodium chloride, acetaminophen, albuterol, docusate, fentaNYL (SUBLIMAZE) injection, hydrALAZINE, labetalol, ondansetron (ZOFRAN) IV, perflutren lipid microspheres (DEFINITY) IV suspension, polyethylene glycol No Known Allergies  OBJECTIVE: Vitals:   07/28/21 1136 07/28/21 1145 07/28/21 1200 07/28/21 1215  BP:   125/64   Pulse:  79 83 86  Resp:  (!) 22 (!) 22 20  Temp: 98.7 F (37.1 C)     TempSrc: Axillary     SpO2:  94% 92% 95%  Weight:      Height:       Body mass index is 29.66 kg/m.  Physical Exam Constitutional:      General:  He is not in acute distress.    Appearance: He is normal weight. He is not toxic-appearing.  HENT:     Head: Normocephalic and atraumatic.     Right Ear: External ear normal.     Left Ear: External ear normal.     Nose: No congestion or rhinorrhea.     Mouth/Throat:     Mouth: Mucous membranes are moist.     Pharynx: Oropharynx is clear.  Eyes:     Extraocular Movements: Extraocular movements intact.      Conjunctiva/sclera: Conjunctivae normal.     Pupils: Pupils are equal, round, and reactive to light.  Cardiovascular:     Rate and Rhythm: Normal rate and regular rhythm.     Heart sounds: No murmur heard.   No friction rub. No gallop.  Pulmonary:     Effort: Pulmonary effort is normal.     Breath sounds: Normal breath sounds.  Abdominal:     General: Abdomen is flat.     Palpations: Abdomen is soft.     Comments: distended  Musculoskeletal:        General: No swelling. Normal range of motion.     Cervical back: Normal range of motion and neck supple.  Skin:    General: Skin is warm and dry.  Neurological:     General: No focal deficit present.     Mental Status: He is oriented to person, place, and time.  Psychiatric:        Mood and Affect: Mood normal.      Lab Results Lab Results  Component Value Date   WBC 14.9 (H) 07/28/2021   HGB 12.0 (L) 07/28/2021   HCT 36.4 (L) 07/28/2021   MCV 92.6 07/28/2021   PLT 148 (L) 07/28/2021    Lab Results  Component Value Date   CREATININE 0.74 07/28/2021   BUN 24 (H) 07/28/2021   NA 142 07/28/2021   K 3.6 07/28/2021   CL 112 (H) 07/28/2021   CO2 25 07/28/2021    Lab Results  Component Value Date   ALT 78 (H) 07/28/2021   AST 45 (H) 07/28/2021   ALKPHOS 108 07/28/2021   BILITOT 1.3 (H) 07/28/2021        Laurice Record, [facility] 07/28/2021, 12:36 PM

## 2021-07-28 NOTE — Consult Note (Signed)
WOC Nurse Consult Note: Reason for Consult: consulted for NPWT to the midline and multiple wounds NPWT orders discontinued this am; VAC dressing was not added over the weekend and assessment of the wounds per surgery this am decision to leave with saline moist to moist dressings.  Wound type: Deep tissue pressure injury: inner upper gluteal cleft and bilateral upper buttocks  Abrasions: right and left knee Abrasions; right and left elbows Both of these areas present more like abrasions than pressure; with area on his buttocks will assume his down time was on his back.  If he fell in the bathroom and tried to move around or crawl this would explain more the areas on the elbows and knees.  Pressure Injury POA: Yes Measurement: 5cm x 8cm x 0cm; gluteal cleft/bilateral buttocks Wound HOZ:YYQM purple; non blanchable  Drainage (amount, consistency, odor) minimal from the buttocks; brownish Scant from the elbow and knee abrasions; non purulent  Periwound: intact  Dressing procedure/placement/frequency: Continue silicone foam to protect the elbows and knees Monitor buttocks for evolution  Re consult if needed, will not follow at this time. Thanks  Deniel Mcquiston [employer], RN,CWOCN, CNS, [name] [phone])

## 2021-07-28 NOTE — Progress Notes (Addendum)
NAME:  Matthew Dominguez, MRN:  [id number], DOB:  03-06-1951, LOS: 2 ADMISSION DATE:  07/25/2021 CONSULTATION DATE:  07/25/2021 REFERRING MD:  Roderic Palau - EDP CHIEF COMPLAINT:  Concern for sepsis   History of Present Illness:  70 year old male who presented to [facility] ED 11/18 for after being found down at home with AMS. CT A/P on admission c/f intraperitoneal free air.  S/p ex-lap, drainage of liver abscesses and partial omentectomy 11/18 ([facility]). Admitted to the ICU postoperatively 11/18 with sepsis and hepatitis secondary to ruptured pyogenic liver abscesses. Extubated successfully 11/19.  Passed bedside swallow 11/20, okay for sips and chips by surgery.  Pertinent Medical History:   Past Medical History:  Diagnosis Date   Abnormalities of the hair    Allergic rhinitis, cause unspecified    Allergy    Anxiety    Arthritis    Asthma    Backache, unspecified    Broken ribs 11/2015   Cataract    Cervicalgia    Colon polyps    COPD (chronic obstructive pulmonary disease) ([place])    Depression    Diabetes mellitus    Encounter for long-term (current) use of other medications    Family history of breast cancer    Full dentures    GERD (gastroesophageal reflux disease)    Hypertension    Hypertrophy of prostate without urinary obstruction and other lower urinary tract symptoms (LUTS)    Incomplete bladder emptying    Insomnia, unspecified    Neoplasm of uncertain behavior of skin    Nystagmus, unspecified    Other and unspecified hyperlipidemia    Other premature beats    Pyogenic granuloma of skin and subcutaneous tissue    Seasonal allergies    Special screening for malignant neoplasm of prostate    Tachycardia, unspecified    Type I (juvenile type) diabetes mellitus without mention of complication, uncontrolled    Unspecified arthropathy, shoulder region    Unspecified asthma(493.90)    Unspecified essential hypertension    Unspecified hypothyroidism    Urinary frequency    Wears  glasses    Significant Hospital Events: Including procedures, antibiotic start and stop dates in addition to other pertinent events   11/18 went from ER to OR for exploratory laparotomy and drainage of pyogenic liver abscesses (2) and partial omentectomy 11/19 ID consulted, Extubated 11/20 Passed bedside swallow. ID consulted. 11/21 Confused, required mitts. Cleviprex gtt continues for hypertension.  Interim History / Subjective:  Pleasantly confused this AM BUE mitts in place No c/o pain, but does seem uncomfortable when examining midline incision Quiet bowel sounds BP elevated on A-line to 170s, not correlating with cuff (SBP 90s) Echo, Abd [place] pending  Objective:  Blood pressure (!) 92/49, pulse 89, temperature 99.3 F (37.4 C), temperature source Axillary, resp. rate (!) 23, height 5\' 7"  (1.702 m), weight 85.9 kg, SpO2 93 %.        Intake/Output Summary (Last 24 hours) at 07/28/2021 0738 Last data filed at 07/28/2021 0600 Gross per 24 hour  Intake 4861.49 ml  Output 1545 ml  Net 3316.49 ml   Filed Weights   07/25/21 1529 07/26/21 0100 07/28/21 0500  Weight: 84 kg 81.4 kg 85.9 kg   Physical Examination: General: Chronically ill-appearing elderly man in NAD. HEENT: [place]/AT, anicteric sclera, PERRL, dry mucous membranes. Neuro: Awake, oriented x 2. Responds to verbal stimuli. Following commands consistently. Moves all 4 extremities spontaneously. Strength 5/5 in all 4 extremities. CV: RRR, no m/g/r. PULM: Breathing even and  unlabored on 5LNC. Lung fields with scattered rhonchi bilaterally. GI: Soft, mildly distended, appropriately TTP postoperatively. Hypoactive bowel sounds. Midline incision packed with Kerlix, skin open/fascia closed. RLQ/LLQ JPs with ?bile-tinged serosanguineous output. Extremities: Trace BLE edema noted. Skin: Warm/dry, no rashes.  Resolved Hospital Problem List:    Assessment & Plan:  Sepsis secondary to ruptured pyogenic liver abscesses x 2 S/p  exploratory laparotomy and partial omentectomy 11/18 S/p ex-lap and omentectomy 11/18. WBC trending down.  Fever to Tmax 101.22F 11/19PM, resolved. - ID consulted, appreciate recommendations - Continue empiric cefepime and Flagyl, will need at least 4-week course - F/u BCx - F/u TTE today, 11/21 - F/u Abd [place] - Postoperative care per [facility]  Hepatitis secondary to ruptured pyogenic liver abscesses LFTs improving this morning. - Trend LFTs - F/u finalized Hepatitis Panel/RPR - F/u Abd [place] as above - May need additional imaging with MRI/MRCP   Hypertension Ongoing hypertension, remains on Cleviprex. Cuff/A-line pressures are not correlating. - Goal SBP < 180 - Continue Cleviprex titrated to goal - Continue Losartan at increased dose  - Continue Bisoprolol, amlodipine  Suspected postoperative ileus - Scheduled bowel regimen - Consider repeat KUB - Diet advancement per CCS  T2DM Glucoses 137-205 last 24H. - Basal glargine 7U BID - SSI - CBGs Q4H   Dementia  delirium  agitation Patient pulled out NG tube twice 11/19.  Surgery okayed to leave it out.  Was placed in soft wrist restraints overnight for agitation that would not allow IV meds to infuse. - Seroquel 25mg  QHS - Delirium precautions - Mitts PRN  COPD  - DuoNebs TID - Pulmicort BID - Albuterol nebs PRN  GERD - PPI  Best Practice: (right click and "Reselect all SmartList Selections" daily)   Diet/type: NPO - sips/chips per CCS/SLP DVT prophylaxis: LMWH GI prophylaxis: PPI Lines: N/A Foley:  Yes, and it is still needed Code Status:  full code Last date of multidisciplinary goals of care discussion [DNR reversed for surgery, discuss patient wishes going forward 11/21]  Critical care time: 35 minutes   Lestine Mount, PA-C [facility] 07/28/21 7:38 AM
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
## 2021-07-29 ENCOUNTER — Inpatient Hospital Stay: Payer: Self-pay

## 2021-07-29 ENCOUNTER — Inpatient Hospital Stay (HOSPITAL_COMMUNITY): Payer: Medicare HMO

## 2021-07-29 DIAGNOSIS — K750 Abscess of liver: Secondary | ICD-10-CM | POA: Diagnosis not present

## 2021-07-29 LAB — COMPREHENSIVE METABOLIC PANEL
ALT: 56 U/L — ABNORMAL HIGH (ref 0–44)
AST: 28 U/L (ref 15–41)
Albumin: <1.5 g/dL — ABNORMAL LOW (ref 3.5–5.0)
Alkaline Phosphatase: 110 U/L (ref 38–126)
Anion gap: 7 (ref 5–15)
BUN: 22 mg/dL (ref 8–23)
CO2: 20 mmol/L — ABNORMAL LOW (ref 22–32)
Calcium: 7.6 mg/dL — ABNORMAL LOW (ref 8.9–10.3)
Chloride: 112 mmol/L — ABNORMAL HIGH (ref 98–111)
Creatinine, Ser: 0.73 mg/dL (ref 0.61–1.24)
GFR, Estimated: >60 mL/min (ref >60)
Glucose, Bld: 162 mg/dL — ABNORMAL HIGH (ref 70–99)
Potassium: 3.8 mmol/L (ref 3.5–5.1)
Sodium: 139 mmol/L (ref 135–145)
Total Bilirubin: 1.4 mg/dL — ABNORMAL HIGH (ref 0.3–1.2)
Total Protein: 5.1 g/dL — ABNORMAL LOW (ref 6.5–8.1)

## 2021-07-29 LAB — CBC
HCT: 38.9 % — ABNORMAL LOW (ref 39.0–52.0)
Hemoglobin: 12.4 g/dL — ABNORMAL LOW (ref 13.0–17.0)
MCH: 30.2 pg (ref 26.0–34.0)
MCHC: 31.9 g/dL (ref 30.0–36.0)
MCV: 94.6 fL (ref 80.0–100.0)
Platelets: 198 10*3/uL (ref 150–400)
RBC: 4.11 MIL/uL — ABNORMAL LOW (ref 4.22–5.81)
RDW: 14.5 % (ref 11.5–15.5)
WBC: 15.4 10*3/uL — ABNORMAL HIGH (ref 4.0–10.5)
nRBC: 0 % (ref 0.0–0.2)

## 2021-07-29 LAB — GLUCOSE, CAPILLARY
Glucose-Capillary: 114 mg/dL — ABNORMAL HIGH (ref 70–99)
Glucose-Capillary: 118 mg/dL — ABNORMAL HIGH (ref 70–99)
Glucose-Capillary: 129 mg/dL — ABNORMAL HIGH (ref 70–99)
Glucose-Capillary: 138 mg/dL — ABNORMAL HIGH (ref 70–99)
Glucose-Capillary: 149 mg/dL — ABNORMAL HIGH (ref 70–99)
Glucose-Capillary: 154 mg/dL — ABNORMAL HIGH (ref 70–99)

## 2021-07-29 LAB — PHOSPHORUS: Phosphorus: 3 mg/dL (ref 2.5–4.6)

## 2021-07-29 LAB — HEPATITIS A ANTIBODY, IGM: Hep A IgM: NONREACTIVE

## 2021-07-29 LAB — SURGICAL PATHOLOGY

## 2021-07-29 LAB — MAGNESIUM: Magnesium: 1.9 mg/dL (ref 1.7–2.4)

## 2021-07-29 LAB — PREALBUMIN: Prealbumin: <5 mg/dL — ABNORMAL LOW (ref 18–38)

## 2021-07-29 MED ORDER — SODIUM CHLORIDE 0.9 % IV SOLN
2.0000 g | INTRAVENOUS | Status: DC
  Administered 2021-07-29 – 2021-08-09 (×12): 2 g via INTRAVENOUS
  Filled 2021-07-29 (×12): qty 20

## 2021-07-29 MED ORDER — BISACODYL 10 MG RE SUPP
10.0000 mg | Freq: Once | RECTAL | Status: AC
  Administered 2021-07-29: 10 mg via RECTAL
  Filled 2021-07-29: qty 1

## 2021-07-29 MED ORDER — MAGNESIUM SULFATE 2 GM/50ML IV SOLN
2.0000 g | Freq: Once | INTRAVENOUS | Status: AC
  Administered 2021-07-29: 2 g via INTRAVENOUS
  Filled 2021-07-29: qty 50

## 2021-07-29 MED ORDER — METRONIDAZOLE 500 MG/100ML IV SOLN
500.0000 mg | Freq: Two times a day (BID) | INTRAVENOUS | Status: DC
  Administered 2021-07-29 – 2021-08-09 (×22): 500 mg via INTRAVENOUS
  Filled 2021-07-29 (×21): qty 100

## 2021-07-29 NOTE — Evaluation (Signed)
Physical Therapy Evaluation Patient Details Name: Matthew Dominguez MRN: [id number] DOB: 30-Jun-1951 Today's Date: 07/29/2021  History of Present Illness  70 yo admitted 11/18 after found down at home with AMS. Pt with sepsis due to ruptured liver abscess s/p ex lap with drainage same date. Pt intubated 11/18-11/19. PMhx: DM, HTN, COPD, mild cognitive impairment  Clinical Impression  Pt awake and able to state name but disoriented to place, time and situation. Pt restless once EOB and trying to scoot and shift but unable to significantly scoot at EOB with oxygen desaturation to 83% requiring NRB at 10L to return to 91% with RN present. Pt confused, with abdominal pain with movement, decreased ability with transfers and functional task with generalized weakness. Per RN pt's spouse is also in the hospital and unsure of what assist will be available at D/C. Pt will benefit from acute therapy to maximize mobility, safety and function to decrease burden of care.        Recommendations for follow up therapy are one component of a multi-disciplinary discharge planning process, led by the attending physician.  Recommendations may be updated based on patient status, additional functional criteria and insurance authorization.  Follow Up Recommendations Skilled nursing-short term rehab (<3 hours/day)    Assistance Recommended at Discharge Frequent or constant Supervision/Assistance  Functional Status Assessment Patient has had a recent decline in their functional status and/or demonstrates limited ability to make significant improvements in function in a reasonable and predictable amount of time  Equipment Recommendations  Hospital bed;Wheelchair (measurements PT);Wheelchair cushion (measurements PT)    Recommendations for Other Services OT consult     Precautions / Restrictions Precautions Precautions: Fall;Other (comment) Precaution Comments: watch sats, aline      Mobility  Bed Mobility Overal bed  mobility: Needs Assistance Bed Mobility: Rolling;Sidelying to Sit;Sit to Supine Rolling: Mod assist Sidelying to sit: Mod assist   Sit to supine: Mod assist   General bed mobility comments: physical assist to bend knee and reach and roll with pad to side. Assist to move legs off surface and elevate trunk. Return to supine with difficulty following commands to return to bed. pt with desaturation to 83% EOb with O2 bump from 3>5L without significant change. Transition to NRB at 10L with sats slowly rising to 91% with RN present    Transfers                   General transfer comment: unable to attempt based on cognition and respiratory status    Ambulation/Gait                  Stairs            Wheelchair Mobility    Modified Rankin (Stroke Patients Only)       Balance Overall balance assessment: Needs assistance   Sitting balance-Leahy Scale: Poor Sitting balance - Comments: minguard for sitting EOB as pt impulsive and trying to stand with guarding for safety                                     Pertinent Vitals/Pain Pain Assessment: Faces Faces Pain Scale: Hurts little more Pain Location: abdomen with mobility Pain Descriptors / Indicators: Aching;Guarding Pain Intervention(s): Limited activity within patient's tolerance;Monitored during session;Repositioned    Home Living Family/patient expects to be discharged to:: Private residence Living Arrangements: Spouse/significant other Available Help at Discharge: Family;Available PRN/intermittently Type  of Home: House Home Access: Stairs to enter   [employer] of Steps: 2   Home Layout: One level Home Equipment: None      Prior Function Prior Level of Function : Independent/Modified Independent                     Hand Dominance        Extremity/Trunk Assessment   Upper Extremity Assessment Upper Extremity Assessment: Generalized weakness    Lower  Extremity Assessment Lower Extremity Assessment: Generalized weakness    Cervical / Trunk Assessment Cervical / Trunk Assessment: Kyphotic  Communication   Communication: HOH  Cognition Arousal/Alertness: Awake/alert Behavior During Therapy: Flat affect;Restless Overall Cognitive Status: Impaired/Different from baseline Area of Impairment: Orientation;Attention;Memory;Following commands;Safety/judgement;Awareness;Problem solving                 Orientation Level: Disoriented to;Time;Situation;Place Current Attention Level: Focused Memory: Decreased short-term memory Following Commands: Follows one step commands inconsistently;Follows one step commands with increased time Safety/Judgement: Decreased awareness of safety;Decreased awareness of deficits Awareness: Intellectual Problem Solving: Slow processing;Requires tactile cues;Requires verbal cues;Decreased initiation General Comments: pt oriented to self and able to answer home setup but reports SOB and pulling off O2. Pt frequently stating "no" even when not pertinent.        General Comments      Exercises     Assessment/Plan    PT Assessment Patient needs continued PT services  PT Problem List Decreased strength;Decreased mobility;Decreased safety awareness;Decreased activity tolerance;Decreased balance;Decreased knowledge of use of DME;Decreased cognition;Cardiopulmonary status limiting activity;Pain       PT Treatment Interventions DME instruction;Therapeutic exercise;Gait training;Functional mobility training;Therapeutic activities;Patient/family education;Cognitive remediation;Stair training;Balance training    PT Goals (Current goals can be found in the Care Plan section)  Acute Rehab PT Goals Patient Stated Goal: "I got to go" PT Goal Formulation: Patient unable to participate in goal setting Time For Goal Achievement: 08/12/21 Potential to Achieve Goals: Fair    Frequency Min 2X/week   Barriers to  discharge Decreased caregiver support      Co-evaluation               AM-PAC PT "6 Clicks" Mobility  Outcome Measure Help needed turning from your back to your side while in a flat bed without using bedrails?: A Lot Help needed moving from lying on your back to sitting on the side of a flat bed without using bedrails?: A Lot Help needed moving to and from a bed to a chair (including a wheelchair)?: Total Help needed standing up from a chair using your arms (e.g., wheelchair or bedside chair)?: Total Help needed to walk in hospital room?: Total Help needed climbing 3-5 steps with a railing? : Total 6 Click Score: 8    End of Session   Activity Tolerance: Treatment limited secondary to medical complications (Comment) Patient left: in bed;with call bell/phone within reach;with bed alarm set;with nursing/sitter in room Nurse Communication: Mobility status;Other (comment);Need for lift equipment (desaturation) PT Visit Diagnosis: Other abnormalities of gait and mobility (R26.89);Muscle weakness (generalized) (M62.81);Difficulty in walking, not elsewhere classified (R26.2)    Time: 7408-1448 PT Time Calculation (min) (ACUTE ONLY): 29 min   Charges:   PT Evaluation $PT Eval Moderate Complexity: 1 Mod PT Treatments $Therapeutic Activity: 8-22 mins         Jacque P, PT Acute Rehabilitation Services Pager: [phone] Office: [phone]   Sandy Salaam Gisela Lea 07/29/2021, 12:52 PM

## 2021-07-29 NOTE — TOC Initial Note (Signed)
Transition of Care [facility]) - Initial/Assessment Note    Patient Details  Name: Matthew Dominguez MRN: [id number] Date of Birth: 08/23/1951  Transition of Care [facility]) CM/SW Contact:    Matthew Dominguez, Matthew Dominguez Phone Number: 07/29/2021, 4:11 PM  Clinical Narrative:                 CSW received consult for possible SNF placement at time of discharge.  Due to patient being disoriented and the patient's spouse also being in the hospital, CSW contacted the patient's daughter Matthew Dominguez, [phone]. Ms. Matthew Dominguez expressed understanding of PT recommendation and is agreeable to SNF placement at time of discharge. The family's preference is for [facility] or [facility].  The family is trying to get the patient's spouse admitted to [facility] and hope that the two can be placed together.  CSW discussed insurance authorization process and will provide Medicare SNF ratings list. Patient has received 2 COVID vaccines and the family is open to the patient receiving  booster.  Ms. Matthew Dominguez reports that the patient is more alert and oriented at baseline and the family is concerned because the patient is still disoriented and not back to baseline prior to admission.   Skilled Nursing Rehab Facilities-   [email or web address]   Ratings out of 5 possible   Name Address  Phone # [place] Inspection Overall  [facility] [address], [name] 5 5 2 4   [facility]  [address], [place] [phone] 4 2 5 5   [facility] [place], [place] 4 1 1 1   [facility] [place], [place] 2 2 4 4   [facility] [address] [place] 2 1 1 1   [place]. [address], [place] 3 1 4 3   [facility] [address] [place] 5 2 2 3   [facility] [address] [place] 4 1 2 1    [facility] [place], [place] [phone] 5 1 2 2   [facility] [phone], [place] [phone] 4 1 1 1   [facility] [address] [facility] [phone] 4 1 2 1   [place] [phone] 4 1 1 1             Patient Goals and CMS Choice        Expected Discharge Plan and Services                                                Prior Living Arrangements/Services                       Activities of Daily Living      Permission Sought/Granted                  Emotional Assessment              Admission diagnosis:  Fall [W19.XXXA] Gastric perforation ([place]) [K25.5] Pyogenic liver abscess [K75.0] Patient Active Problem List   Diagnosis Date Noted   Pyogenic liver abscess 07/26/2021   Encounter for postanesthesia care 07/26/2021   GERD (gastroesophageal reflux disease)    Chronic right-sided low back pain with right-sided sciatica 08/08/2018   Drug-induced constipation 08/08/2018  Uncontrolled type 2 diabetes mellitus with hyperglycemia ([place]) 11/01/2017   Family history of colonic polyps 08/18/2017   Colon polyps    Family history of breast cancer    Multiple rib fractures 12/20/2015   Bifascicular bundle branch block 12/09/2015   Pleural effusion, right 12/09/2015   Mild cognitive impairment 12/09/2015   Abnormal CXR 10/07/2014   Right rotator cuff tendonitis 01/11/2014   Insomnia 06/15/2013   Diabetes mellitus type 2, controlled ([place]) 03/01/2013   Essential hypertension, benign 03/01/2013   Hyperlipidemia 03/01/2013   COPD (chronic obstructive pulmonary disease) ([place]) 03/01/2013   BPH (benign prostatic hyperplasia) 03/01/2013   Pain in joint, shoulder region 03/01/2013   PCP:  Matthew Dominguez, Matthew Bucks, NP Pharmacy:   [facility], [place] [place]. [place] [address] Phone:  [phone] Fax: [phone]     Social Determinants of Health (SDOH) Interventions    Readmission Risk Interventions No flowsheet data found.

## 2021-07-29 NOTE — Progress Notes (Signed)
[place] for Infectious Disease  Date of Admission:  07/25/2021   Total days of inpatient antibiotics 4  Principal Problem:   Pyogenic liver abscess Active Problems:   Diabetes mellitus type 2, controlled ([place])   Essential hypertension, benign   COPD (chronic obstructive pulmonary disease) ([facility])   GERD (gastroesophageal reflux disease)   Encounter for postanesthesia care          Assessment: 70 year old male with diabetes poorly controlled, COPD, mild cognitive impairment admitted for pneumoperitoneum secondary to ruptured liver abscess.  CT showed pneumoperitoneum, cholelithiasis, fatty liver.  On arrival LFTs elevated with a T bili of 3.5.  ID consult for antibiotic recommendations.   #Pneumoperitoneum 2/2 ruptured pyogenic liver abscess SP Ex lap on 11/18 #Uncontrolled DM A1c 12.5 on 12/18/20 #Chronic fatty liver disease, NASH: LFTs consistently elevated since 2017 #Remote Hx of IVDA(last use 1974) -Liver abscesses usually followed by protal vein infection especially in the setting of peritonitis. In this case I suspect it is 2/2 biliary tract disease. Given pt's Hx of gallstones, this is likely the etiology. Would consider doing MRCP.  It could be secondary to staph or strep bacteremia, Gram-negative bacteremia include [place]. as well. Blood cultures are negative so far. Follow-up:  -HCV NR, HAV(Reactive->immune)-immune, HBV-non immune. In the setting of NASH do not suspect acute hepatitis.  -TTE negative for vegetation Recommendations:  -D/C cefepime -Start ceftriaxone  -Continue metronidazole -Follow-up MRCP to evaluate biliary tract, pending results consider engaging GI -Follow 11/19 blood Cx -Anticipate atleast 4 weeks of antibiotics - HBV re-immunization on discharge   Microbiology:   Antibiotics: Cefepime and metronidazole 11/18-p Cultures: Blood Cx: 11/19 NGTD     SUBJECTIVE: Resting bed. No new complaints.  Review of Systems: Review  of Systems  All other systems reviewed and are negative.   Scheduled Meds:  amLODipine  10 mg Oral Daily   bisoprolol  10 mg Oral Daily   budesonide (PULMICORT) nebulizer solution  0.5 mg Nebulization BID   Chlorhexidine Gluconate Cloth  6 each Topical Daily   docusate sodium  100 mg Oral BID   enoxaparin (LOVENOX) injection  40 mg Subcutaneous Daily   guaiFENesin  15 mL Oral Q4H   insulin aspart  0-15 Units Subcutaneous Q4H   insulin glargine-yfgn  7 Units Subcutaneous BID   ipratropium-albuterol  3 mL Nebulization TID   losartan  100 mg Oral Daily   mouth rinse  15 mL Mouth Rinse BID   polyethylene glycol  17 g Oral Daily   QUEtiapine  25 mg Oral QHS   Continuous Infusions:  sodium chloride Stopped (07/29/21 0932)   cefTRIAXone (ROCEPHIN)  IV     clevidipine 16 mg/hr (07/29/21 1000)   famotidine (PEPCID) IV 100 mL/hr at 07/29/21 1000   lactated ringers 125 mL/hr at 07/29/21 1000   metronidazole     PRN Meds:.sodium chloride, acetaminophen, albuterol, docusate, fentaNYL (SUBLIMAZE) injection, hydrALAZINE, labetalol, ondansetron (ZOFRAN) IV, polyethylene glycol No Known Allergies  OBJECTIVE: Vitals:   07/29/21 0929 07/29/21 0930 07/29/21 0945 07/29/21 1000  BP:   (!) 145/57 (!) 133/54  Pulse: 89 88 95 92  Resp: (!) 25 (!) 29 (!) 25 (!) 26  Temp:      TempSrc:      SpO2: 90% (!) 89% (!) 68% 93%  Weight:      Height:       Body mass index is 29.8 kg/m.  Physical Exam Constitutional:      General:  He is not in acute distress.    Appearance: He is normal weight. He is not toxic-appearing.  HENT:     Head: Normocephalic and atraumatic.     Right Ear: External ear normal.     Left Ear: External ear normal.     Nose: No congestion or rhinorrhea.     Mouth/Throat:     Mouth: Mucous membranes are moist.     Pharynx: Oropharynx is clear.  Eyes:     Extraocular Movements: Extraocular movements intact.     Conjunctiva/sclera: Conjunctivae normal.     Pupils: Pupils  are equal, round, and reactive to light.  Cardiovascular:     Rate and Rhythm: Normal rate and regular rhythm.     Heart sounds: No murmur heard.   No friction rub. No gallop.  Pulmonary:     Effort: Pulmonary effort is normal.     Breath sounds: Normal breath sounds.  Abdominal:     General: Abdomen is flat.     Palpations: Abdomen is soft.     Comments: distended  Musculoskeletal:        General: No swelling. Normal range of motion.     Cervical back: Normal range of motion and neck supple.  Skin:    General: Skin is warm and dry.  Neurological:     General: No focal deficit present.     Mental Status: He is oriented to person, place, and time.  Psychiatric:        Mood and Affect: Mood normal.      Lab Results Lab Results  Component Value Date   WBC 15.4 (H) 07/29/2021   HGB 12.4 (L) 07/29/2021   HCT 38.9 (L) 07/29/2021   MCV 94.6 07/29/2021   PLT 198 07/29/2021    Lab Results  Component Value Date   CREATININE 0.73 07/29/2021   BUN 22 07/29/2021   NA 139 07/29/2021   K 3.8 07/29/2021   CL 112 (H) 07/29/2021   CO2 20 (L) 07/29/2021    Lab Results  Component Value Date   ALT 56 (H) 07/29/2021   AST 28 07/29/2021   ALKPHOS 110 07/29/2021   BILITOT 1.4 (H) 07/29/2021        Laurice Record, [place] for Infectious Disease [facility] 07/29/2021, 10:25 AM

## 2021-07-29 NOTE — Progress Notes (Signed)
Spoke with primary RN Maudie Mercury about PICC placement. Will hold off placing picc tonight and reevaluate in am.

## 2021-07-29 NOTE — Progress Notes (Signed)
[facility] ADULT ICU REPLACEMENT PROTOCOL   The patient does apply for the [facility] Adult ICU Electrolyte Replacment Protocol based on the criteria listed below:   1.Exclusion criteria: TCTS patients, ECMO patients, and Dialysis patients 2. Is GFR >/= 30 ml/min? Yes.    Patient's GFR today is >60  3. Is SCr </= 2? Yes.   Patient's SCr is 0.73 mg/dL 4. Did SCr increase >/= 0.5 in 24 hours? No. 5.Pt's weight >40kg  Yes.   6. Abnormal electrolyte(s): mag 1.9  7. Electrolytes replaced per protocol 8.  Call MD STAT for K+ </= 2.5, Phos </= 1, or Mag </= 1 Physician:  n/a   Matthew Dominguez 07/29/2021 5:40 AM

## 2021-07-29 NOTE — Progress Notes (Signed)
NAME:  Matthew Dominguez, MRN:  [id number], DOB:  [birth year], LOS: 3 ADMISSION DATE:  07/25/2021 CONSULTATION DATE:  07/25/2021 REFERRING MD:  Roderic Palau - EDP CHIEF COMPLAINT:  Concern for sepsis   History of Present Illness:  70 year old male who presented to [facility] ED 11/18 for after being found down at home with AMS. CT A/P on admission c/f intraperitoneal free air.  S/p ex-lap, drainage of liver abscesses and partial omentectomy 11/18 ([facility]). Admitted to the ICU postoperatively 11/18 with sepsis and hepatitis secondary to ruptured pyogenic liver abscesses. Extubated successfully 11/19.  Passed bedside swallow 11/20, okay for sips and chips by surgery, but has had slow ROBF postoperatively.  Pertinent Medical History:   Past Medical History:  Diagnosis Date   Abnormalities of the hair    Allergic rhinitis, cause unspecified    Allergy    Anxiety    Arthritis    Asthma    Backache, unspecified    Broken ribs 11/2015   Cataract    Cervicalgia    Colon polyps    COPD (chronic obstructive pulmonary disease) ([place])    Depression    Diabetes mellitus    Encounter for long-term (current) use of other medications    Family history of breast cancer    Full dentures    GERD (gastroesophageal reflux disease)    Hypertension    Hypertrophy of prostate without urinary obstruction and other lower urinary tract symptoms (LUTS)    Incomplete bladder emptying    Insomnia, unspecified    Neoplasm of uncertain behavior of skin    Nystagmus, unspecified    Other and unspecified hyperlipidemia    Other premature beats    Pyogenic granuloma of skin and subcutaneous tissue    Seasonal allergies    Special screening for malignant neoplasm of prostate    Tachycardia, unspecified    Type I (juvenile type) diabetes mellitus without mention of complication, uncontrolled    Unspecified arthropathy, shoulder region    Unspecified asthma(493.90)    Unspecified essential hypertension    Unspecified  hypothyroidism    Urinary frequency    Wears glasses    Significant Hospital Events: Including procedures, antibiotic start and stop dates in addition to other pertinent events   11/18 went from ER to OR for exploratory laparotomy and drainage of pyogenic liver abscesses (2) and partial omentectomy 11/19 ID consulted, Extubated 11/20 Passed bedside swallow. ID consulted. 11/21 Confused, required mitts. Cleviprex gtt continues for hypertension. 11/22 Ongoing Cleviprex needs. O2 needs decreased. Still slow ROBF, consideration of TPN if unable to tolerate feeds.  Interim History / Subjective:  Drowsy but wakes easily to voice, pleasant No significant issues overnight Ongoing Cleviprex needs Likely inconsistent absorption of PO meds due to slow ROBF Needs to have a BM  Objective:  Blood pressure (!) 135/55, pulse 77, temperature 99.1 F (37.3 C), temperature source Axillary, resp. rate (!) 21, height 5\' 7"  (1.702 m), weight 86.3 kg, SpO2 96 %.        Intake/Output Summary (Last 24 hours) at 07/29/2021 0809 Last data filed at 07/29/2021 0800 Gross per 24 hour  Intake 4509.58 ml  Output 2110 ml  Net 2399.58 ml    Filed Weights   07/26/21 0100 07/28/21 0500 07/29/21 3557  Weight: 81.4 kg 85.9 kg 86.3 kg   Physical Examination: General: Chronically ill-appearing elderly man in NAD. HEENT: [place]/AT, anicteric sclera, PERRL, dry mucous membranes. Neuro: Awake, oriented x 2. Responds to verbal stimuli. Following commands consistently. Moves all 4  extremities spontaneously. Strength 4/5 in all 4 extremities. CV: RRR, no m/g/r. PULM: Breathing even and unlabored on 2L [place]. Lung fields with faint expiratory wheezes bilaterally. GI: Soft, nontender, moderately distended, appropriately TTP postoperatively. Hypoactive bowel sounds. Midline incision with Kerlix packing, skin open/fascia closed. RLQ JP with yellow serosanguineous output. LLQ JP with scant serous drainage. Extremities: Trace  symmetric BLE edema noted. Skin: Warm/dry, no rashes.  Resolved Hospital Problem List:    Assessment & Plan:  Sepsis secondary to ruptured pyogenic liver abscesses x 2 S/p exploratory laparotomy and partial omentectomy 11/18 S/p ex-lap and omentectomy 11/18. WBC trending down.  Fever to Tmax 101.10F 11/19PM, resolved. TTE negative for vegetations. - ID consulted, appreciate recommendations - Continue empiric cefepime/Flagyl, will require at least a 4-week course - F/u finalized Bcx, unfortunately no OR Cx available - F/u Abd [place] - Postoperative care per CCS  Hepatitis secondary to ruptured pyogenic liver abscesses LFTs continue to improve. - Trend LFTs - F/u finalized Hepatitis Panel/RPR - F/u Abd [place] (as above) - F/u MRI/MRCP   Hypertension Ongoing hypertension, remains on Cleviprex. Cuff/A-line pressures are not correlating. - Goal SBP < 180 - Continue Cleviprex titrated to goal - Continue Losartan at increased dose - Continue Bisoprolol, amlodipine - May need to consider IV PRNs in the setting of slow ROBF/variable gut absorption  Postoperative ileus At risk for malnutrition - Scheduled bowel regimen - Escalate to suppository, enema today 11/22 - Diet advancement per CCS, may need TPN if unable to advance - Would hold on PICC/central access until BCx finalized and negative - Nutrition consult for eventual TF vs. TPN - Of note, receiving Kcal from Cleviprex (~1500kcal)  T2DM Glucoses 137-205 last 24H. - Basal glargine 7U BID - SSI - CBGs Q4H   Dementia  delirium  agitation Patient pulled out NG tube twice 11/19.  Surgery okayed to leave it out.  Was placed in soft wrist restraints overnight for agitation that would not allow IV meds to infuse. - Seroquel 25mg  QHS - Delirium precautions - Mitts PRN  COPD  - DuoNebs TID - Pulmicort BID - Albuterol nebs PRN  GERD - PPI  Best Practice: (right click and "Reselect all SmartList Selections" daily)   Diet/type:  NPO - sips/chips per CCS/SLP DVT prophylaxis: LMWH GI prophylaxis: PPI Lines: N/A Foley:  Yes, and it is still needed Code Status:  full code Last date of multidisciplinary goals of care discussion [DNR reversed for surgery, discuss patient wishes going forward 11/21]  Critical care time: 32 minutes   Lestine Mount, PA-C [facility] 07/29/21 8:09 AM

## 2021-07-29 NOTE — Progress Notes (Addendum)
PHARMACY - TOTAL PARENTERAL NUTRITION CONSULT NOTE   Indication: Prolonged ileus  Patient Measurements: Height: 5\' 7"  (170.2 cm) Weight: 86.3 kg (190 lb 4.1 oz) IBW/kg (Calculated) : 66.1 TPN AdjBW (KG): 70.6 Body mass index is 29.8 kg/m.  Assessment: 70 years of age male with perforated liver abscess status post partial omentectomy and drains in the right and left upper quadrants.  Speech evaluated for ability to swallow and deemed moderate aspiration risk with NPO status. KUB on 11/21 with possible ileus vs obstructive.   Glucose / Insulin: CBGs 120-160s requiring Semglee (insulin glargine-yfgn) 7 units every 12 hours and 10 units of moderate sliding scale.  Electrolytes: K 3.8, Mg 1.9- 2g given.  Renal: SCr 0.73 Hepatic: ALT mildly elevated. Tbili 1.4.  Intake / Output; MIVF: UOP 0.4 mL/kg/hr.  GI Imaging: [place] and MR of abdomen pending GI Surgeries / Procedures: none since consult for TPN   Central access: PICC pending TPN start date: holding off today  Nutritional Goals: Goal TPN rate is 85 mL/hr (provides 112 g of protein and 2100 kcals per day) -- *if weaned off Clevidipine  RD Assessment: Estimated Needs Total Energy Estimated Needs: 2050-2250 Total Protein Estimated Needs: 100-115 grams Total Fluid Estimated Needs: >2L  Current Nutrition:  NPO Clevidipine at 16 mg/hr (weaning) >> provides 1536 kcals per day at current rate  Plan:  Discussed with Surgery and CCM team - Holding off on TPN start today and monitoring response to increased bowel regimen. Will continue to follow and re-evaluate in AM.   Sloan Leiter, PharmD, BCPS, BCCCP Clinical Pharmacist Please refer to [facility] for [place] numbers 07/29/2021,11:12 AM

## 2021-07-29 NOTE — Progress Notes (Signed)
4 Days Post-Op  Subjective: CC: Patient more awake and alert this am. Orientated to self and time. Reports generalized abdominal pain. No nausea or emesis. No flatus. No BM overnight. Foley still in place. Confirmed with RN she received high output report from RUQ JP in sign out.   Objective: Vital signs in last 24 hours: Temp:  [98.1 F (36.7 C)-99.7 F (37.6 C)] 99.1 F (37.3 C) (11/22 0713) Pulse Rate:  [72-94] 77 (11/22 0800) Resp:  [18-32] 21 (11/22 0800) BP: (107-143)/(38-66) 135/55 (11/21 2000) SpO2:  [88 %-100 %] 96 % (11/22 0800) Arterial Line BP: (150-191)/(41-63) 160/41 (11/22 0800) Weight:  [86.3 kg] 86.3 kg (11/22 0627) Last BM Date:  (PTA)  Intake/Output from previous day: 11/21 0701 - 11/22 0700 In: 2257.1 [I.V.:1987; IV Piggyback:270.1] Out: 2275 [Urine:900; Drains:1375] Intake/Output this shift: Total I/O In: 2411.5 [I.V.:1911.5; IV Piggyback:500] Out: 81 [Urine:35; Drains:25]  PE: Gen:  Awake and alert in NAD  Card:  Reg Pulm:  Normal rate and effort Abd: Distended but soft, generalized tenderness greatest in the upper abdomen. Hypoactive bowel sounds. Midline wound clean and without evidence of dehisence. RUQ JP drain with SS fluid in bulb without gross bile (1.315L/24 hours). LUQ drain with SS fluid in bulb without gross bile (60cc/24 hours) Ext:  No LE edema  GU - foley in place. Dark yellow urine in bag Psych: A&Ox2  Lab Results:  Recent Labs    07/28/21 0425 07/29/21 0455  WBC 14.9* 15.4*  HGB 12.0* 12.4*  HCT 36.4* 38.9*  PLT 148* 198   BMET Recent Labs    07/28/21 0425 07/29/21 0455  NA 142 139  K 3.6 3.8  CL 112* 112*  CO2 25 20*  GLUCOSE 175* 162*  BUN 24* 22  CREATININE 0.74 0.73  CALCIUM 7.9* 7.6*   PT/INR No results for input(s): LABPROT, INR in the last 72 hours. CMP     Component Value Date/Time   NA 139 07/29/2021 0455   NA 142 01/15/2016 1031   K 3.8 07/29/2021 0455   CL 112 (H) 07/29/2021 0455   CO2 20 (L)  07/29/2021 0455   GLUCOSE 162 (H) 07/29/2021 0455   BUN 22 07/29/2021 0455   BUN 10 01/15/2016 1031   CREATININE 0.73 07/29/2021 0455   CREATININE 0.74 04/18/2021 1353   CALCIUM 7.6 (L) 07/29/2021 0455   PROT 5.1 (L) 07/29/2021 0455   PROT 6.2 01/15/2016 1031   ALBUMIN <1.5 (L) 07/29/2021 0455   ALBUMIN 4.1 01/15/2016 1031   AST 28 07/29/2021 0455   ALT 56 (H) 07/29/2021 0455   ALKPHOS 110 07/29/2021 0455   BILITOT 1.4 (H) 07/29/2021 0455   BILITOT 0.3 01/15/2016 1031   GFRNONAA >60 07/29/2021 0455   GFRNONAA 91 02/04/2021 0959   GFRAA 106 02/04/2021 0959   Lipase     Component Value Date/Time   LIPASE 36 07/27/2021 0420    Studies/Results: DG Abd 1 View  Result Date: 07/28/2021 CLINICAL DATA:  Ileus EXAM: ABDOMEN - 1 VIEW COMPARISON:  Abdominal x-ray 07/26/2021 FINDINGS: Bilateral surgical drainage catheters identified in the upper abdomen. Loops of distended small bowel identified in the right lower abdomen measuring up to 3.3 cm in diameter. No suspicious calcifications. IMPRESSION: Abnormal bowel gas pattern which may represent ileus or obstruction. Continued follow-up recommended. Electronically Signed   By: Ofilia Neas M.D.   On: 07/28/2021 11:46   DG Chest Port 1 View  Result Date: 07/29/2021 CLINICAL DATA:  Shortness of breath. EXAM:  PORTABLE CHEST 1 VIEW COMPARISON:  Chest x-ray 07/27/2021. FINDINGS: Mediastinum stable. Stable cardiomegaly. Left mid and lower lung infiltrates noted on today's exam. Tiny left pleural effusion cannot be excluded. Right costophrenic angle not imaged. No pneumothorax. Prior cervical fusion. IMPRESSION: 1. Left mid lung and left base pulmonary infiltrates noted on today's exam. Tiny left pleural effusion cannot be excluded. 2. Stable cardiomegaly. Electronically Signed   By: Marcello Moores  Register M.D.   On: 07/29/2021 06:18   ECHOCARDIOGRAM COMPLETE BUBBLE STUDY  Result Date: 07/28/2021    ECHOCARDIOGRAM REPORT   Patient Name:   Matthew Dominguez Date of Exam: 07/28/2021 Medical Rec #:  [id number]     Height:       67.0 in Accession #:    [id number]    Weight:       189.4 lb Date of Birth:  06/15/1951      BSA:          1.976 m Patient Age:    70 years      BP:           92/49 mmHg Patient Gender: M             HR:           90 bpm. Exam Location:  Inpatient Procedure: 2D Echo, Cardiac Doppler, Color Doppler, Saline Contrast Bubble Study            and Intracardiac Opacification Agent Indications:    Absess of liver, bubble  History:        Patient has no prior history of Echocardiogram examinations.                 COPD; Risk Factors:Diabetes.  Sonographer:    Glo Herring Referring Phys: [id number] [facility]  Sonographer Comments: Technically difficult study due to poor echo windows. IMPRESSIONS  1. Left ventricular ejection fraction, by estimation, is 60 to 65%. The left ventricle has normal function. The left ventricle has no regional wall motion abnormalities. Left ventricular diastolic parameters are consistent with Grade I diastolic dysfunction (impaired relaxation).  2. Right ventricular systolic function is normal. The right ventricular size is normal. Tricuspid regurgitation signal is inadequate for assessing PA pressure.  3. The mitral valve is grossly normal. Trivial mitral valve regurgitation.  4. The aortic valve was not well visualized. Aortic valve regurgitation is not visualized. Aortic valve mean gradient measures 6.0 mmHg.  5. The inferior vena cava is normal in size with greater than 50% respiratory variability, suggesting right atrial pressure of 3 mmHg.  6. Agitated saline contrast bubble study was negative, with no evidence of any interatrial shunt. Comparison(s): No prior Echocardiogram. FINDINGS  Left Ventricle: Left ventricular ejection fraction, by estimation, is 60 to 65%. The left ventricle has normal function. The left ventricle has no regional wall motion abnormalities. Definity contrast agent was given IV to delineate  the left ventricular  endocardial borders. The left ventricular internal cavity size was normal in size. There is no left ventricular hypertrophy. Left ventricular diastolic parameters are consistent with Grade I diastolic dysfunction (impaired relaxation). Indeterminate filling pressures. Right Ventricle: The right ventricular size is normal. No increase in right ventricular wall thickness. Right ventricular systolic function is normal. Tricuspid regurgitation signal is inadequate for assessing PA pressure. Left Atrium: Left atrial size was normal in size. Right Atrium: Right atrial size was normal in size. Pericardium: There is no evidence of pericardial effusion. Mitral Valve: The mitral valve is grossly normal. Trivial mitral valve  regurgitation. Tricuspid Valve: The tricuspid valve is grossly normal. Tricuspid valve regurgitation is trivial. Aortic Valve: The aortic valve was not well visualized. Aortic valve regurgitation is not visualized. Aortic valve mean gradient measures 6.0 mmHg. Aortic valve peak gradient measures 11.2 mmHg. Pulmonic Valve: The pulmonic valve was not well visualized. Pulmonic valve regurgitation is not visualized. Aorta: The aortic root and ascending aorta are structurally normal, with no evidence of dilitation. Venous: The inferior vena cava is normal in size with greater than 50% respiratory variability, suggesting right atrial pressure of 3 mmHg. IAS/Shunts: No atrial level shunt detected by color flow Doppler. Agitated saline contrast was given intravenously to evaluate for intracardiac shunting. Agitated saline contrast bubble study was negative, with no evidence of any interatrial shunt.   Diastology LV e' medial:    5.87 cm/s LV E/e' medial:  14.8 LV e' lateral:   6.42 cm/s LV E/e' lateral: 13.6  RIGHT VENTRICLE             IVC RV S prime:     14.70 cm/s  IVC diam: 1.80 cm LEFT ATRIUM             Index LA Vol (A2C):   26.3 ml 13.31 ml/m LA Vol (A4C):   28.8 ml 14.58 ml/m LA  Biplane Vol: 28.2 ml 14.27 ml/m  AORTIC VALVE AV Vmax:           167.00 cm/s AV Vmean:          111.000 cm/s AV VTI:            0.280 m AV Peak Grad:      11.2 mmHg AV Mean Grad:      6.0 mmHg LVOT Vmax:         155.00 cm/s LVOT Vmean:        103.000 cm/s LVOT VTI:          0.266 m LVOT/AV VTI ratio: 0.95  AORTA Ao Root diam: 3.10 cm MITRAL VALVE MV Area (PHT): 3.46 cm     SHUNTS MV Decel Time: 219 msec     Systemic VTI: 0.27 m MV E velocity: 87.00 cm/s MV A velocity: 107.00 cm/s MV E/A ratio:  0.81 Lyman Bishop MD Electronically signed by Lyman Bishop MD Signature Date/Time: 07/28/2021/11:59:25 AM    Final     Anti-infectives: Anti-infectives (From admission, onward)    Start     Dose/Rate Route Frequency Ordered Stop   07/26/21 1400  ceFEPIme (MAXIPIME) 2 g in sodium chloride 0.9 % 100 mL IVPB        2 g 200 mL/hr over 30 Minutes Intravenous Every 8 hours 07/26/21 0721     07/26/21 0800  ceFEPIme (MAXIPIME) 2 g in sodium chloride 0.9 % 100 mL IVPB  Status:  Discontinued        2 g 200 mL/hr over 30 Minutes Intravenous Every 12 hours 07/25/21 1853 07/26/21 0213   07/26/21 0600  metroNIDAZOLE (FLAGYL) IVPB 500 mg        500 mg 100 mL/hr over 60 Minutes Intravenous Every 8 hours 07/26/21 0119     07/26/21 0600  ceFEPIme (MAXIPIME) 2 g in sodium chloride 0.9 % 100 mL IVPB  Status:  Discontinued        2 g 200 mL/hr over 30 Minutes Intravenous Every 12 hours 07/26/21 0225 07/26/21 0721   07/25/21 1900  ceFEPIme (MAXIPIME) 2 g in sodium chloride 0.9 % 100 mL IVPB  2 g 200 mL/hr over 30 Minutes Intravenous  Once 07/25/21 1845 07/25/21 2015   07/25/21 1900  metroNIDAZOLE (FLAGYL) IVPB 500 mg        500 mg 100 mL/hr over 60 Minutes Intravenous  Once 07/25/21 1845 07/25/21 2138        Assessment/Plan POD 4 s/p Exploratory laparotomy with drainage of pyogenic liver abscess x 2, Partial omentectomy for Ruptured pyogenic liver abscesses by Dr. Dema Severin - 07/25/2021 - Cont IV abx.  Appreciate ID's assistance. On Cefepime/Flagyl. They are working up underlying etiology of abscesses. Blood Cx's pending, NGTD. Their note states other source could be portal vein infection 2/2 biliary tract and are obtaining RUQ [place] and MRCP. Per op note and sign out, gallstones were palpable within the lumen of the gallbladder but the gallbladder is grossly normal. Hepatitis A was reactive. Will follow up on ID's thoughts if this could be causing the abscesses - Continue drains and monitor output. High output from RUQ drain with 1360ml/24 hours. Drain output remains similar to yesterday with mostly SS fluid and without gross bile. Will review with MD. Question if this is is from some reactive ascites - Monitor LFT's. Stable/downtrending this am with T. Bili 1.4 (from 1.3) - Xray this AM. Suspect he has an ileus. Albumin < 1.5, pre-albumin <5. Suspect he will need PICC/TPN - Cont WTD BID to midline - Therapies, mobilize - Pulm toilet   FEN - Keep on sips with meds. Will get KUB this am. If develops worsening abdominal pain, distension, or any n/v - recommend NGT VTE - SCDs, Lovenox ID - Cefepime/Flagyl. T Max 99.7. WBC 15.4 from 14.9 Foley - in place. Recommend d/c   HTN  - on Cleviprex  Dementia/delirium/agitation - on Seroquel COPD GERD DM2 Prior hx IVDU (1970's)   LOS: 3 days    Jillyn Ledger , [facility] 07/29/2021, 9:08 AM Please see Amion for pager number during day hours 7:00am-4:30pm

## 2021-07-30 ENCOUNTER — Inpatient Hospital Stay (HOSPITAL_COMMUNITY): Payer: Medicare HMO

## 2021-07-30 DIAGNOSIS — K750 Abscess of liver: Secondary | ICD-10-CM | POA: Diagnosis not present

## 2021-07-30 DIAGNOSIS — J432 Centrilobular emphysema: Secondary | ICD-10-CM | POA: Diagnosis not present

## 2021-07-30 DIAGNOSIS — E440 Moderate protein-calorie malnutrition: Secondary | ICD-10-CM | POA: Diagnosis present

## 2021-07-30 LAB — COMPREHENSIVE METABOLIC PANEL
ALT: 46 U/L — ABNORMAL HIGH (ref 0–44)
AST: 24 U/L (ref 15–41)
Albumin: <1.5 g/dL — ABNORMAL LOW (ref 3.5–5.0)
Alkaline Phosphatase: 121 U/L (ref 38–126)
Anion gap: 6 (ref 5–15)
BUN: 19 mg/dL (ref 8–23)
CO2: 23 mmol/L (ref 22–32)
Calcium: 7.8 mg/dL — ABNORMAL LOW (ref 8.9–10.3)
Chloride: 114 mmol/L — ABNORMAL HIGH (ref 98–111)
Creatinine, Ser: 0.69 mg/dL (ref 0.61–1.24)
GFR, Estimated: >60 mL/min (ref >60)
Glucose, Bld: 123 mg/dL — ABNORMAL HIGH (ref 70–99)
Potassium: 3.7 mmol/L (ref 3.5–5.1)
Sodium: 143 mmol/L (ref 135–145)
Total Bilirubin: 1.2 mg/dL (ref 0.3–1.2)
Total Protein: 5.5 g/dL — ABNORMAL LOW (ref 6.5–8.1)

## 2021-07-30 LAB — CBC
HCT: 39.4 % (ref 39.0–52.0)
HCT: 38 % — ABNORMAL LOW (ref 39.0–52.0)
Hemoglobin: 12.8 g/dL — ABNORMAL LOW (ref 13.0–17.0)
Hemoglobin: 12.3 g/dL — ABNORMAL LOW (ref 13.0–17.0)
MCH: 30.3 pg (ref 26.0–34.0)
MCH: 30.3 pg (ref 26.0–34.0)
MCHC: 32.5 g/dL (ref 30.0–36.0)
MCHC: 32.4 g/dL (ref 30.0–36.0)
MCV: 93.4 fL (ref 80.0–100.0)
MCV: 93.6 fL (ref 80.0–100.0)
Platelets: 259 10*3/uL (ref 150–400)
Platelets: 252 10*3/uL (ref 150–400)
RBC: 4.22 MIL/uL (ref 4.22–5.81)
RBC: 4.06 MIL/uL — ABNORMAL LOW (ref 4.22–5.81)
RDW: 14.6 % (ref 11.5–15.5)
RDW: 14.6 % (ref 11.5–15.5)
WBC: 21.7 10*3/uL — ABNORMAL HIGH (ref 4.0–10.5)
WBC: 18.4 10*3/uL — ABNORMAL HIGH (ref 4.0–10.5)
nRBC: 0 % (ref 0.0–0.2)
nRBC: 0 % (ref 0.0–0.2)

## 2021-07-30 LAB — APTT: aPTT: 29 seconds (ref 24–36)

## 2021-07-30 LAB — PHOSPHORUS: Phosphorus: 3.5 mg/dL (ref 2.5–4.6)

## 2021-07-30 LAB — GLUCOSE, CAPILLARY
Glucose-Capillary: 102 mg/dL — ABNORMAL HIGH (ref 70–99)
Glucose-Capillary: 107 mg/dL — ABNORMAL HIGH (ref 70–99)
Glucose-Capillary: 111 mg/dL — ABNORMAL HIGH (ref 70–99)
Glucose-Capillary: 114 mg/dL — ABNORMAL HIGH (ref 70–99)
Glucose-Capillary: 116 mg/dL — ABNORMAL HIGH (ref 70–99)
Glucose-Capillary: 169 mg/dL — ABNORMAL HIGH (ref 70–99)

## 2021-07-30 LAB — PROTIME-INR
INR: 1.4 — ABNORMAL HIGH (ref 0.8–1.2)
Prothrombin Time: 17.6 seconds — ABNORMAL HIGH (ref 11.4–15.2)

## 2021-07-30 LAB — MAGNESIUM: Magnesium: 2.1 mg/dL (ref 1.7–2.4)

## 2021-07-30 MED ORDER — SODIUM CHLORIDE 0.9% FLUSH
10.0000 mL | Freq: Two times a day (BID) | INTRAVENOUS | Status: DC
  Administered 2021-07-30 – 2021-08-14 (×9): 10 mL

## 2021-07-30 MED ORDER — SODIUM CHLORIDE 0.9% FLUSH: 10.0000 mL | INTRAVENOUS | Status: DC | PRN

## 2021-07-30 MED ORDER — FUROSEMIDE 10 MG/ML IJ SOLN
40.0000 mg | Freq: Once | INTRAMUSCULAR | Status: AC
  Administered 2021-07-30: 40 mg via INTRAVENOUS
  Filled 2021-07-30: qty 4

## 2021-07-30 MED ORDER — SODIUM CHLORIDE 0.9% FLUSH
10.0000 mL | Freq: Two times a day (BID) | INTRAVENOUS | Status: DC
  Administered 2021-07-30 – 2021-08-14 (×16): 10 mL

## 2021-07-30 MED ORDER — TRAVASOL 10 % IV SOLN
INTRAVENOUS | Status: AC
  Filled 2021-07-30: qty 480

## 2021-07-30 MED ORDER — HEPARIN (PORCINE) 25000 UT/250ML-% IV SOLN
2450.0000 [IU]/h | INTRAVENOUS | Status: DC
Start: 2021-07-30 — End: 2021-08-14
  Administered 2021-07-30: 1100 [IU]/h via INTRAVENOUS
  Administered 2021-07-31: 15:00:00 1750 [IU]/h via INTRAVENOUS
  Administered 2021-08-01: 2200 [IU]/h via INTRAVENOUS
  Administered 2021-08-01: 05:00:00 1950 [IU]/h via INTRAVENOUS
  Administered 2021-08-02 (×2): 2350 [IU]/h via INTRAVENOUS
  Administered 2021-08-03 – 2021-08-14 (×24): 2450 [IU]/h via INTRAVENOUS
  Filled 2021-07-30 (×30): qty 250

## 2021-07-30 MED ORDER — POTASSIUM CHLORIDE 10 MEQ/100ML IV SOLN
10.0000 meq | INTRAVENOUS | Status: AC
  Administered 2021-07-30 (×3): 10 meq via INTRAVENOUS
  Filled 2021-07-30 (×3): qty 100

## 2021-07-30 NOTE — Progress Notes (Signed)
2nd attempt at MRI unsuccessful. Pt moving too much and unable to follow instructions to hold still or hold breath.

## 2021-07-30 NOTE — Progress Notes (Signed)
Peripherally Inserted Central Catheter Placement  The IV Nurse has discussed with the patient and/or persons authorized to consent for the patient, the purpose of this procedure and the potential benefits and risks involved with this procedure.  The benefits include less needle sticks, lab draws from the catheter, and the patient may be discharged home with the catheter. Risks include, but not limited to, infection, bleeding, blood clot (thrombus formation), and puncture of an artery; nerve damage and irregular heartbeat and possibility to perform a PICC exchange if needed/ordered by physician.  Alternatives to this procedure were also discussed.  [name] patient education guide, fact sheet on infection prevention and patient information card has been provided to patient /or left at bedside.    PICC Placement Documentation  PICC Double Lumen 26/71/24 PICC Right Basilic 39 cm 0 cm (Active)  Indication for Insertion or Continuance of Line Administration of hyperosmolar/irritating solutions (i.e. TPN, Vancomycin, etc.) 07/30/21 1416  Exposed Catheter (cm) 0 cm 07/30/21 1416  Site Assessment Clean;Dry;Intact 07/30/21 1416  Lumen #1 Status Flushed;Blood return noted;Saline locked 07/30/21 1416  Lumen #2 Status Flushed;Blood return noted;Saline locked 07/30/21 1416  Dressing Type Transparent 07/30/21 1416  Dressing Status Clean;Dry;Intact 07/30/21 1416  Antimicrobial disc in place? Yes 07/30/21 1416  Dressing Change Due 08/06/21 07/30/21 1416   Daughter  gave consent via telephone     Matthew Dominguez 07/30/2021, 2:22 PM

## 2021-07-30 NOTE — Progress Notes (Signed)
ANTICOAGULATION CONSULT NOTE - Initial Consult  Pharmacy Consult for Heparin Indication: Portal Vein Thrombosis   No Known Allergies  Patient Measurements: Height: 5\' 7"  (170.2 cm) Weight: 73.7 kg (162 lb 7.7 oz) (dietician aware. Weight taken without SCD's on bed) IBW/kg (Calculated) : 66.1 Heparin Dosing Weight: 73 kg   Vital Signs: Temp: 98.4 F (36.9 C) (11/23 1537) Temp Source: Oral (11/23 1537) BP: 118/57 (11/23 1900) Pulse Rate: 84 (11/23 1900)  Labs: Recent Labs    07/28/21 0425 07/29/21 0455 07/30/21 0408  HGB 12.0* 12.4* 12.8*  HCT 36.4* 38.9* 39.4  PLT 148* 198 259  CREATININE 0.74 0.73 0.69    Estimated Creatinine Clearance: 78 mL/min (by C-G formula based on SCr of 0.69 mg/dL).   Medical History: Past Medical History:  Diagnosis Date   Abnormalities of the hair    Allergic rhinitis, cause unspecified    Allergy    Anxiety    Arthritis    Asthma    Backache, unspecified    Broken ribs 11/2015   Cataract    Cervicalgia    Colon polyps    COPD (chronic obstructive pulmonary disease) ([place])    Depression    Diabetes mellitus    Encounter for long-term (current) use of other medications    Family history of breast cancer    Full dentures    GERD (gastroesophageal reflux disease)    Hypertension    Hypertrophy of prostate without urinary obstruction and other lower urinary tract symptoms (LUTS)    Incomplete bladder emptying    Insomnia, unspecified    Neoplasm of uncertain behavior of skin    Nystagmus, unspecified    Other and unspecified hyperlipidemia    Other premature beats    Pyogenic granuloma of skin and subcutaneous tissue    Seasonal allergies    Special screening for malignant neoplasm of prostate    Tachycardia, unspecified    Type I (juvenile type) diabetes mellitus without mention of complication, uncontrolled    Unspecified arthropathy, shoulder region    Unspecified asthma(493.90)    Unspecified essential hypertension     Unspecified hypothyroidism    Urinary frequency    Wears glasses    Assessment: 70 yo male presented on 07/25/2021 after being found down with AMS found to have perforated liver abscess s/p partial omentectomy and drain placement. MRI with suspected portal vein thrombosis. Results of MRI dicussed with CCM and surgery. Pharmacy consulted to dose heparin. Hgb 12.8. Plt wnl.    Goal of Therapy:  Heparin level 0.3-0.7 units/ml Monitor platelets by anticoagulation protocol: Yes   Plan:  Start heparin infusion at 1100 units/hr  Check 8 hr heparin level  Monitor heparin level, CBC and s/s of bleeding   Cristela Felt, PharmD, BCPS Clinical Pharmacist 07/30/2021 8:03 PM

## 2021-07-30 NOTE — Progress Notes (Signed)
NAME:  Matthew Dominguez, MRN:  [id number], DOB:  [birth year], LOS: 4 ADMISSION DATE:  07/25/2021 CONSULTATION DATE:  07/25/2021 REFERRING MD:  Roderic Palau - EDP CHIEF COMPLAINT:  Concern for sepsis   History of Present Illness:  70 year old male who presented to [facility] ED 11/18 for after being found down at home with AMS. CT A/P on admission c/f intraperitoneal free air.  S/p ex-lap, drainage of liver abscesses and partial omentectomy 11/18 ([facility]). Admitted to the ICU postoperatively 11/18 with sepsis and hepatitis secondary to ruptured pyogenic liver abscesses. Extubated successfully 11/19.  Passed bedside swallow 11/20, okay for sips and chips by surgery, but has had slow ROBF postoperatively.  Pertinent Medical History:   Past Medical History:  Diagnosis Date   Abnormalities of the hair    Allergic rhinitis, cause unspecified    Allergy    Anxiety    Arthritis    Asthma    Backache, unspecified    Broken ribs 11/2015   Cataract    Cervicalgia    Colon polyps    COPD (chronic obstructive pulmonary disease) ([place])    Depression    Diabetes mellitus    Encounter for long-term (current) use of other medications    Family history of breast cancer    Full dentures    GERD (gastroesophageal reflux disease)    Hypertension    Hypertrophy of prostate without urinary obstruction and other lower urinary tract symptoms (LUTS)    Incomplete bladder emptying    Insomnia, unspecified    Neoplasm of uncertain behavior of skin    Nystagmus, unspecified    Other and unspecified hyperlipidemia    Other premature beats    Pyogenic granuloma of skin and subcutaneous tissue    Seasonal allergies    Special screening for malignant neoplasm of prostate    Tachycardia, unspecified    Type I (juvenile type) diabetes mellitus without mention of complication, uncontrolled    Unspecified arthropathy, shoulder region    Unspecified asthma(493.90)    Unspecified essential hypertension    Unspecified  hypothyroidism    Urinary frequency    Wears glasses    Significant Hospital Events: Including procedures, antibiotic start and stop dates in addition to other pertinent events   11/18 went from ER to OR for exploratory laparotomy and drainage of pyogenic liver abscesses (2) and partial omentectomy 11/19 ID consulted, Extubated 11/20 Passed bedside swallow. ID consulted. 11/21 Confused, required mitts. Cleviprex gtt continues for hypertension. 11/22 Ongoing Cleviprex needs. O2 needs decreased. Still slow ROBF, consideration of TPN if unable to tolerate feeds. 11/22 cefepime discontinued, changed to ceftriaxone, metronidazole continued.  Interim History / Subjective:   Lasix given overnight, total body volume overload.  I/O+ 11.3 L total NG tube placement attempted, unsuccessful Cefepime changed to ceftriaxone, Flagyl continued Patient went for MRCP but was agitated and this could not be completed On 5 L nasal cannula No bowel movement   Objective:  Blood pressure (!) 143/122, pulse 95, temperature 99 F (37.2 C), temperature source Oral, resp. rate (!) 29, height 5\' 7"  (1.702 m), weight 89.9 kg, SpO2 95 %.        Intake/Output Summary (Last 24 hours) at 07/30/2021 0738 Last data filed at 07/30/2021 0700 Gross per 24 hour  Intake 4922.57 ml  Output 1795 ml  Net 3127.57 ml   Filed Weights   07/28/21 0500 07/29/21 0627 07/30/21 0420  Weight: 85.9 kg 86.3 kg 89.9 kg   Physical Examination: General: Ill-appearing weak gentleman, no distress  HEENT: Oropharynx dry.  Pupils equal, no stridor.  Very mild dysarthria but can be understood Neuro: Wakes easily, interacts.  Answers questions and follows commands.  Mild dysarthria that appears to be related to his level of wakefulness CV: Regular, no murmur PULM: Comfortable respiratory pattern.  Few scattered rhonchi.  No crackles or wheezes, decreased to both bases GI: Distended, tympanitic, mild diffuse tenderness.  Barely any bowel  sounds heard.  Midline incision with packing.  Bilateral JP drains with serosanguineous output Extremities.  Trace to 1+ lower extremity edema Skin: No rash  Resolved Hospital Problem List:    Assessment & Plan:  Sepsis secondary to ruptured pyogenic liver abscesses x 2 S/p exploratory laparotomy and partial omentectomy 11/18 S/p ex-lap and omentectomy 11/18.  Remains febrile, has persistent leukocytosis Appreciate ID assistance.  Remains on metronidazole, cefepime changed to ceftriaxone on 11/22 Follow culture data Was unable to get MRCP as he could not follow instructions to perform a breath-hold.  We will try again when we believe he can perform Wound care, postop care as per CCS plans  Hepatitis secondary to ruptured pyogenic liver abscesses Follow LFT intermittently Will need MRCP when able to obtain   Hypertension Ongoing hypertension, remains on Cleviprex. Cuff/A-line pressures are not correlating. Will attempt to get reliable cuff pressures, go by these and discontinue art line.  Wean Precedex to off as able, SBP goal <180 He has an ileus but were still giving enteral blood pressure medications (question effectiveness): Losartan, bisoprolol, amlodipine Hydralazine IV and labetalol are both ordered as needed  Postoperative ileus At risk for malnutrition Continue bowel regimen.  Consider repeat enema 11/23 Refused NG tube placement.  Will defer for now unless he gets more abdominal distention, abdominal pain Will proceed with PICC placement 11/23 to facilitate TPN  T2DM Continue basal glargine Sliding scale insulin as ordered   Dementia  delirium  agitation Directable and oriented at this time. Try to minimize sedating medications as able.  Continue Seroquel 25 mg at night  COPD  DuoNeb and Pulmicort nebs as ordered Albuterol nebs as needed   GERD PPI  Best Practice: (right click and "Reselect all SmartList Selections" daily)   Diet/type: NPO - sips/chips per  CCS/SLP DVT prophylaxis: LMWH GI prophylaxis: PPI Lines: N/A Foley:  Yes, and it is still needed Code Status:  full code Last date of multidisciplinary goals of care discussion [DNR reversed for surgery, discuss patient wishes going forward 11/21]  Critical care time: 86 minutes   Baltazar Apo, MD, PhD 07/30/2021, 8:43 AM [facility] [phone] or if no answer before 7:00PM call [phone] For any issues after 7:00PM please call eLink [phone]

## 2021-07-30 NOTE — Progress Notes (Signed)
5 Days Post-Op  Subjective: CC: Patient orientated to self and time. No place or situation. He reports generalized abdominal pain. No nausea. No emesis reported. He denies flatus. NGT unable to be inserted yesterday after multiple attempts by bedside RN in afternoon and PM 2/2 "Pt was thrashing, restless, and uncooperative with directions" per note and bedside RN report. Patient had enema yesterday and RN reports a small amount of something brown that followed. No significant bm. No further bm's. Foley out yesterday. Received lasix x 1 yesterday and has required I/O x 1. Went down for MRCP but exam was not able to be able to be completed as patient was not able to participate with instructions.    Objective: Vital signs in last 24 hours: Temp:  [97.8 F (36.6 C)-99.4 F (37.4 C)] 97.8 F (36.6 C) (11/23 0722) Pulse Rate:  [81-103] 90 (11/23 0745) Resp:  [17-34] 29 (11/23 0745) BP: (65-198)/(42-130) 143/122 (11/23 0715) SpO2:  [68 %-97 %] 95 % (11/23 0822) Arterial Line BP: (121-320)/(32-60) 159/45 (11/23 0745) Weight:  [89.9 kg] 89.9 kg (11/23 0420) Last BM Date: 07/29/21  Intake/Output from previous day: 11/22 0701 - 11/23 0700 In: 4922.6 [I.V.:4026.8; IV Piggyback:895.7] Out: 1855 [Urine:850; Drains:1005] Intake/Output this shift: No intake/output data recorded.  PE: Gen:  Awake and alert in NAD  Card:  Reg Pulm:  Normal rate and effort Abd: Distended but soft, generalized tenderness greatest in the upper abdomen. Hypoactive bowel sounds. Midline wound clean and without evidence of dehisence. RUQ JP drain with cloudy SS fluid in bulb without gross bile (995L/24 hours). LUQ drain with SS fluid in bulb without gross bile (10cc/24 hours) Ext:  No LE edema  Psych: A&Ox2  Lab Results:  Recent Labs    07/29/21 0455 07/30/21 0408  WBC 15.4* 21.7*  HGB 12.4* 12.8*  HCT 38.9* 39.4  PLT 198 259   BMET Recent Labs    07/29/21 0455 07/30/21 0408  NA 139 143  K 3.8 3.7   CL 112* 114*  CO2 20* 23  GLUCOSE 162* 123*  BUN 22 19  CREATININE 0.73 0.69  CALCIUM 7.6* 7.8*   PT/INR No results for input(s): LABPROT, INR in the last 72 hours. CMP     Component Value Date/Time   NA 143 07/30/2021 0408   NA 142 01/15/2016 1031   K 3.7 07/30/2021 0408   CL 114 (H) 07/30/2021 0408   CO2 23 07/30/2021 0408   GLUCOSE 123 (H) 07/30/2021 0408   BUN 19 07/30/2021 0408   BUN 10 01/15/2016 1031   CREATININE 0.69 07/30/2021 0408   CREATININE 0.74 04/18/2021 1353   CALCIUM 7.8 (L) 07/30/2021 0408   PROT 5.5 (L) 07/30/2021 0408   PROT 6.2 01/15/2016 1031   ALBUMIN <1.5 (L) 07/30/2021 0408   ALBUMIN 4.1 01/15/2016 1031   AST 24 07/30/2021 0408   ALT 46 (H) 07/30/2021 0408   ALKPHOS 121 07/30/2021 0408   BILITOT 1.2 07/30/2021 0408   BILITOT 0.3 01/15/2016 1031   GFRNONAA >60 07/30/2021 0408   GFRNONAA 91 02/04/2021 0959   GFRAA 106 02/04/2021 0959   Lipase     Component Value Date/Time   LIPASE 36 07/27/2021 0420    Studies/Results: DG Abd 1 View  Result Date: 07/28/2021 CLINICAL DATA:  Ileus EXAM: ABDOMEN - 1 VIEW COMPARISON:  Abdominal x-ray 07/26/2021 FINDINGS: Bilateral surgical drainage catheters identified in the upper abdomen. Loops of distended small bowel identified in the right lower abdomen measuring up to 3.3  cm in diameter. No suspicious calcifications. IMPRESSION: Abnormal bowel gas pattern which may represent ileus or obstruction. Continued follow-up recommended. Electronically Signed   By: Ofilia Neas M.D.   On: 07/28/2021 11:46   DG Chest Port 1 View  Result Date: 07/29/2021 CLINICAL DATA:  Shortness of breath. EXAM: PORTABLE CHEST 1 VIEW COMPARISON:  Chest x-ray 07/27/2021. FINDINGS: Mediastinum stable. Stable cardiomegaly. Left mid and lower lung infiltrates noted on today's exam. Tiny left pleural effusion cannot be excluded. Right costophrenic angle not imaged. No pneumothorax. Prior cervical fusion. IMPRESSION: 1. Left mid lung  and left base pulmonary infiltrates noted on today's exam. Tiny left pleural effusion cannot be excluded. 2. Stable cardiomegaly. Electronically Signed   By: Marcello Moores  Register M.D.   On: 07/29/2021 06:18   DG Abd Portable 1V  Result Date: 07/29/2021 CLINICAL DATA:  Abdominal pain and distention EXAM: PORTABLE ABDOMEN - 1 VIEW COMPARISON:  07/28/2021 FINDINGS: There is mild to moderate dilation of small-bowel loops measuring up to 3.7 cm in diameter. There is interval increase in degree of small bowel dilation. Stomach is moderately distended. There is prominence of mucosal folds in the margin of the stomach. There is no significant dilation of colon. IMPRESSION: There is interval worsening of small bowel dilation suggesting ileus or partial obstruction. Electronically Signed   By: Elmer Picker M.D.   On: 07/29/2021 09:53   ECHOCARDIOGRAM COMPLETE BUBBLE STUDY  Result Date: 07/28/2021    ECHOCARDIOGRAM REPORT   Patient Name:   ZAKARIYA KNICKERBOCKER Date of Exam: 07/28/2021 Medical Rec #:  [id number]     Height:       67.0 in Accession #:    [id number]    Weight:       189.4 lb Date of Birth:  31-Jul-1951      BSA:          1.976 m Patient Age:    39 years      BP:           92/49 mmHg Patient Gender: M             HR:           90 bpm. Exam Location:  Inpatient Procedure: 2D Echo, Cardiac Doppler, Color Doppler, Saline Contrast Bubble Study            and Intracardiac Opacification Agent Indications:    Absess of liver, bubble  History:        Patient has no prior history of Echocardiogram examinations.                 COPD; Risk Factors:Diabetes.  Sonographer:    Glo Herring Referring Phys: [id number] [facility]  Sonographer Comments: Technically difficult study due to poor echo windows. IMPRESSIONS  1. Left ventricular ejection fraction, by estimation, is 60 to 65%. The left ventricle has normal function. The left ventricle has no regional wall motion abnormalities. Left ventricular diastolic parameters  are consistent with Grade I diastolic dysfunction (impaired relaxation).  2. Right ventricular systolic function is normal. The right ventricular size is normal. Tricuspid regurgitation signal is inadequate for assessing PA pressure.  3. The mitral valve is grossly normal. Trivial mitral valve regurgitation.  4. The aortic valve was not well visualized. Aortic valve regurgitation is not visualized. Aortic valve mean gradient measures 6.0 mmHg.  5. The inferior vena cava is normal in size with greater than 50% respiratory variability, suggesting right atrial pressure of 3 mmHg.  6. Agitated saline contrast bubble  study was negative, with no evidence of any interatrial shunt. Comparison(s): No prior Echocardiogram. FINDINGS  Left Ventricle: Left ventricular ejection fraction, by estimation, is 60 to 65%. The left ventricle has normal function. The left ventricle has no regional wall motion abnormalities. Definity contrast agent was given IV to delineate the left ventricular  endocardial borders. The left ventricular internal cavity size was normal in size. There is no left ventricular hypertrophy. Left ventricular diastolic parameters are consistent with Grade I diastolic dysfunction (impaired relaxation). Indeterminate filling pressures. Right Ventricle: The right ventricular size is normal. No increase in right ventricular wall thickness. Right ventricular systolic function is normal. Tricuspid regurgitation signal is inadequate for assessing PA pressure. Left Atrium: Left atrial size was normal in size. Right Atrium: Right atrial size was normal in size. Pericardium: There is no evidence of pericardial effusion. Mitral Valve: The mitral valve is grossly normal. Trivial mitral valve regurgitation. Tricuspid Valve: The tricuspid valve is grossly normal. Tricuspid valve regurgitation is trivial. Aortic Valve: The aortic valve was not well visualized. Aortic valve regurgitation is not visualized. Aortic valve mean  gradient measures 6.0 mmHg. Aortic valve peak gradient measures 11.2 mmHg. Pulmonic Valve: The pulmonic valve was not well visualized. Pulmonic valve regurgitation is not visualized. Aorta: The aortic root and ascending aorta are structurally normal, with no evidence of dilitation. Venous: The inferior vena cava is normal in size with greater than 50% respiratory variability, suggesting right atrial pressure of 3 mmHg. IAS/Shunts: No atrial level shunt detected by color flow Doppler. Agitated saline contrast was given intravenously to evaluate for intracardiac shunting. Agitated saline contrast bubble study was negative, with no evidence of any interatrial shunt.   Diastology LV e' medial:    5.87 cm/s LV E/e' medial:  14.8 LV e' lateral:   6.42 cm/s LV E/e' lateral: 13.6  RIGHT VENTRICLE             IVC RV S prime:     14.70 cm/s  IVC diam: 1.80 cm LEFT ATRIUM             Index LA Vol (A2C):   26.3 ml 13.31 ml/m LA Vol (A4C):   28.8 ml 14.58 ml/m LA Biplane Vol: 28.2 ml 14.27 ml/m  AORTIC VALVE AV Vmax:           167.00 cm/s AV Vmean:          111.000 cm/s AV VTI:            0.280 m AV Peak Grad:      11.2 mmHg AV Mean Grad:      6.0 mmHg LVOT Vmax:         155.00 cm/s LVOT Vmean:        103.000 cm/s LVOT VTI:          0.266 m LVOT/AV VTI ratio: 0.95  AORTA Ao Root diam: 3.10 cm MITRAL VALVE MV Area (PHT): 3.46 cm     SHUNTS MV Decel Time: 219 msec     Systemic VTI: 0.27 m MV E velocity: 87.00 cm/s MV A velocity: 107.00 cm/s MV E/A ratio:  0.81 Lyman Bishop MD Electronically signed by Lyman Bishop MD Signature Date/Time: 07/28/2021/11:59:25 AM    Final    [place] EKG SITE RITE  Result Date: 07/29/2021 If Site Rite image not attached, placement could not be confirmed due to current cardiac rhythm.   Anti-infectives: Anti-infectives (From admission, onward)    Start     Dose/Rate Route Frequency Ordered Stop  07/29/21 1800  metroNIDAZOLE (FLAGYL) IVPB 500 mg        500 mg 100 mL/hr over 60 Minutes  Intravenous Every 12 hours 07/29/21 0930     07/29/21 1400  cefTRIAXone (ROCEPHIN) 2 g in sodium chloride 0.9 % 100 mL IVPB        2 g 200 mL/hr over 30 Minutes Intravenous Every 24 hours 07/29/21 0945     07/26/21 1400  ceFEPIme (MAXIPIME) 2 g in sodium chloride 0.9 % 100 mL IVPB  Status:  Discontinued        2 g 200 mL/hr over 30 Minutes Intravenous Every 8 hours 07/26/21 0721 07/29/21 0945   07/26/21 0800  ceFEPIme (MAXIPIME) 2 g in sodium chloride 0.9 % 100 mL IVPB  Status:  Discontinued        2 g 200 mL/hr over 30 Minutes Intravenous Every 12 hours 07/25/21 1853 07/26/21 0213   07/26/21 0600  metroNIDAZOLE (FLAGYL) IVPB 500 mg  Status:  Discontinued        500 mg 100 mL/hr over 60 Minutes Intravenous Every 8 hours 07/26/21 0119 07/29/21 0930   07/26/21 0600  ceFEPIme (MAXIPIME) 2 g in sodium chloride 0.9 % 100 mL IVPB  Status:  Discontinued        2 g 200 mL/hr over 30 Minutes Intravenous Every 12 hours 07/26/21 0225 07/26/21 0721   07/25/21 1900  ceFEPIme (MAXIPIME) 2 g in sodium chloride 0.9 % 100 mL IVPB        2 g 200 mL/hr over 30 Minutes Intravenous  Once 07/25/21 1845 07/25/21 2015   07/25/21 1900  metroNIDAZOLE (FLAGYL) IVPB 500 mg        500 mg 100 mL/hr over 60 Minutes Intravenous  Once 07/25/21 1845 07/25/21 2138        Assessment/Plan POD 5 s/p Exploratory laparotomy with drainage of pyogenic liver abscess x 2, Partial omentectomy for Ruptured pyogenic liver abscesses by Dr. Dema Severin - 07/25/2021 - Cont IV abx. Appreciate ID's assistance. On Cefepime/Flagyl. They are working up underlying etiology of abscesses. Blood Cx's pending, NGTD. Their note states other source could be portal vein infection 2/2 biliary tract and are obtaining RUQ [place] and MRCP. Per op note and sign out, gallstones were palpable within the lumen of the gallbladder but the gallbladder is grossly normal. Hepatitis panel negative. Will follow up on ID's thoughts if this could be causing the abscesses -  Continue drains and monitor output. High output from RUQ drain. Drain output with cloudy SS output without gross bile. Question if this is is from some reactive ascites - Afebrile. WBC up today at 21.7. Consider repeat CT A/P if any change in drain output, persistently rising leukocytosis or development of fever. Monitor  - Monitor LFT's. Stable/downtrending this am with T. Bili 1.2 - Patient with ileus. NGT unable to be placed yesterday. If he develops worsening abdominal pain, distension, or any n/v - recommend reattempting NGT.  - Start PICC/TPN. Albumin < 1.5, pre-albumin <5 - Cont WTD BID to midline - Therapies, mobilize - Pulm toilet   FEN - NPO. If develops worsening abdominal pain, distension, or any n/v - recommend NGT VTE - SCDs, Lovenox ID - Cefepime/Flagyl. T Max 99.4. WBC 21.7 Foley - out 11/22. I/O x 1 overnight.   HTN  - on Cleviprex  Dementia/delirium/agitation - on Seroquel COPD GERD DM2 Prior hx IVDU (1970's)   LOS: 4 days    Jillyn Ledger , [facility] Surgery 07/30/2021, 8:34  AM Please see Amion for pager number during day hours 7:00am-4:30pm

## 2021-07-30 NOTE — Progress Notes (Signed)
[place] Progress Note Patient Name: ADIN LAKER DOB: 1951/03/23 MRN: [id number]   Date of Service  07/30/2021  HPI/Events of Note  Pt is many liters net (+) with weight gain, over last 3 days  Oliguric though  eICU Interventions  Will do a trial of lasix 40mg  iv x 1.    If UO picks up, then kidneys are 'lasix responsive' and are working appropriately (this would indicate pt is oliguric due to low renal capillary-bed perfusion.  If UO doesn't pick up, it may indicate intra-renal or post renal cause of oliguria.  At this point, bladder scan shows bladder to be essentially negative, but we will insert a foley regardless     Intervention Category Intermediate Interventions: Hypervolemia - evaluation and management;Oliguria - evaluation and management  Tilden Dome 07/30/2021, 5:24 AM

## 2021-07-30 NOTE — Progress Notes (Signed)
Nutrition Follow-up  DOCUMENTATION CODES:   Non-severe (moderate) malnutrition in context of chronic illness  INTERVENTION:   Pharmacy to initiate TPN and advance to goal to meet nutrition needs  Pt meets criteria for moderate malnutrition and is at risk for refeeding.   Once ready to start PO diet will work with pt to meet needs through diet and oral nutrition supplements   NUTRITION DIAGNOSIS:   Moderate Malnutrition related to chronic illness as evidenced by mild fat depletion, moderate fat depletion, mild muscle depletion, moderate muscle depletion. Ongoing.   GOAL:   Patient will meet greater than or equal to 90% of their needs Progressing with initiation of TPN  MONITOR:   Labs, Weight trends, I & O's, Skin, Diet advancement  REASON FOR ASSESSMENT:   Consult Enteral/tube feeding initiation and management (surgery ok'd trickles)  ASSESSMENT:   Pt admitted with sepsis and hepatitis 2/2 ruptured pyogenic liver abscesses. PMH includes HTN, HLD, DM, COPD, hypothyroidism, mild cognitive impairment.  Per surgery pt to remain NPO and if develops worsening abd pain, distention, N/V recommend reattempt NG placement Unable to complete MRCP On 5 L O2 via [place] No bm yet despite enema  Spoke with pt and RN who is at bedside. NFPE completed. Pt unable to answer specific questions about weight. RN to weigh pt on bed scale, noted large difference in weight. Pt now weighing 73.7 kg.  Pt does report that he did not bring his dentures, he is edentulous but has no problems chewing per pt.  Per RN weaning Cleviprex now that BP parameters are relaxed.   11/18: went from ER to OR for exploratory laparotomy and drainage of pyogenic liver abscesses (2) and partial omentectomy; intubated 11/19: ID consulted; extubated 11/22: unsuccessful attempts at NG placement by RN  11/23: plan for PICC and start TPN    Medications reviewed and include: colace, semglee, miralax  Cleviprex @ 14 ml/hr  provides: 672 kcal  LR @ 75 ml/hr  Labs reviewed:  Magnesium and Phos WNL   UOP: 850 ml  RUQ JP: 995 ml LUQ JP: 10 ml  I&O:  + 11.3 L   NUTRITION - FOCUSED PHYSICAL EXAM:  Flowsheet Row Most Recent Value  Orbital Region Mild depletion  Upper Arm Region No depletion  Thoracic and Lumbar Region No depletion  Buccal Region Moderate depletion  Temple Region No depletion  Clavicle Bone Region No depletion  Clavicle and Acromion Bone Region Mild depletion  Scapular Bone Region Unable to assess  Dorsal Hand No depletion  Patellar Region Moderate depletion  Anterior Thigh Region Moderate depletion  Posterior Calf Region Mild depletion  Edema (RD Assessment) None  Hair Reviewed  Eyes Reviewed  Mouth Reviewed  [tongue dry]  Skin Reviewed  Nails Reviewed       Diet Order:   Diet Order             Diet NPO time specified  Diet effective now                   EDUCATION NEEDS:   No education needs have been identified at this time  Skin:  Skin Assessment: Skin Integrity Issues: Skin Integrity Issues:: Unstageable, DTI, Incisions DTI: coccyx Unstageable: bilateral knees and elbows Incisions: abdomen  Last BM:  11/22 small  Height:   Ht Readings from Last 1 Encounters:  07/25/21 5\' 7"  (1.702 m)    Weight:   Wt Readings from Last 1 Encounters:  07/30/21 73.7 kg    BMI:  Body mass index is 25.45 kg/m.  Estimated Nutritional Needs:   Kcal:  2200-2400  Protein:  125-145 grams  Fluid:  >2L  Christan Ciccarelli P., RD, LDN, CNSC See AMiON for contact information

## 2021-07-30 NOTE — Progress Notes (Signed)
This RN attempted to insert NGT after unsuccessful placement on previous shift. Pt was thrashing, restless, and uncooperative with directions. NGT unable to be placed again. Pt has pulled out previous two NGTs.

## 2021-07-30 NOTE — Progress Notes (Signed)
Pt taken to MRI but unable to participate in instructions to complete MRI. Pt returned to 3M04.

## 2021-07-30 NOTE — Progress Notes (Signed)
PHARMACY - TOTAL PARENTERAL NUTRITION CONSULT NOTE   Indication: Prolonged ileus  Patient Measurements: Height: 5\' 7"  (170.2 cm) Weight: 89.9 kg (198 lb 3.1 oz) IBW/kg (Calculated) : 66.1 TPN AdjBW (KG): 70.6 Body mass index is 31.04 kg/m.  Assessment: 43 yom with perforated liver abscess status post partial omentectomy and drains in the right and left upper quadrants.  Speech evaluated for ability to swallow and deemed moderate aspiration risk with NPO status. KUB on 11/21 with possible ileus vs obstructive with worsening on 11/22 imaging. CCM wanted to hold off on TPN start 11/22 and push bowel regimen - BM documented but not significant, smear only per CCM. Pharmacy consulted to initiate TPN 11/23.  Glucose / Insulin: hx uncontrolled DM2. A1c 12.2% (Lantus 58 units QHS + Novolog 18 units qAC + metformin PTA but unclear compliance). CBGs controlled 116-154 on Semglee (insulin glargine-yfgn) 7 units BID + 5 units moderate SSI required in last 24hrs  Electrolytes: K 3.7 (goal >/=4 with ileus), Cl high up to 114, Mg up to 2.1 (s/p Mag sulfate 2g IV x 1 yesterday; goal >/=2 with ileus), others WNL Renal: SCr stable wnl. BUN WNL. S/p Lasix 40mg  IV x 1 on 11/23 AM Hepatic: ALT mildly elevated - trend down. Tbili normalized. Prealbumin <5, albumin <1.5  Intake / Output; MIVF: NGT unable to be placed so far. Drain output 910 ml/24hrs, LBM 11/22 (smear only), UOP 0.4 mL/kg/hr. MIVF - LR at 75 ml/hr, net +11L this admit GI Imaging: 11/22 [place] and MR of abdomen - interval worsening of small bowel dilation suggesting ileus or partial obstruction GI Surgeries / Procedures: none since consult for TPN   Central access: PICC planned 11/23 TPN start date: 11/23  Nutritional Goals: Goal TPN rate is 85 mL/hr (provides 112 g of protein and 2100 kcals per day) -- *if weaned off Clevidipine  RD Assessment: Estimated Needs Total Energy Estimated Needs: 2050-2250 Total Protein Estimated Needs: 100-115  grams Total Fluid Estimated Needs: >2L  Current Nutrition:  NPO Clevidipine at 7mg /hr (weaning) >> provides 672 kCal of lipids per day at current rate  Plan:  -Start TPN at 13mL/hr at 1800. Titrate to goal (85 ml/hr) as appropriate. Watch for refeeding. -Holding off on lipids today per discussion with CCM with patient still on Cleviprex with plans to attempt to wean off today. Hopefully add lipids in tomorrow if weaning successful. No plans to switch to nicardipine at this point per Dr. Lamonte Sakai. -TPN will provide 48g protein and 943kCal, meeting 48% of protein and 46% of kCal needs. Noted - additional lipid kCal being provided from Cleviprex. -Electrolytes in TPN: initiate standard lytes except Cl:Ac ratio - Na 44mEq/L, K 60mEq/L, Ca 45mEq/L, Mg 97mEq/L, and Phos 34mmol/L. Cl:Ac 1:2 -Give K runs x 3 (goal >/=4 with ileus) -Add standard MVI and trace elements to TPN. -Add Pepcid 40mg  to TPN (d/c current IV order) -Continue current regimen of Semglee 7 units BID and Moderate q4h SSI for now and add 10 units regular insulin to TPN bag (conservative amount added to account for TPN dextrose content). F/u CBGs and adjust as needed. -D/c MIVF (LR at 75 ml/hr) per CCM -Monitor TPN labs on Mon/Thurs -F/u resolution of ileus, ability to tolerate PO   Arturo Morton, PharmD, BCPS Please check AMION for all [place] contact numbers Clinical Pharmacist 07/30/2021 9:04 AM

## 2021-07-30 NOTE — Progress Notes (Signed)
SLP Cancellation Note  Patient Details Name: Matthew Dominguez MRN: [id number] DOB: [birth year]   Cancelled treatment:       Reason Eval/Treat Not Completed: Patient not medically ready (Pt's case discussed with Legrand Como, [place] with surgery. It was agreed that, considering pt's NPO status and ileus, SLP services will be discontinued with plan for reconsult when pt is able to initiate p.o.)  [name] I. Hardin Negus, [place], [place] Office number [phone] Pager [place] 07/30/2021, 4:02 PM

## 2021-07-30 NOTE — Progress Notes (Signed)
[place] Progress Note Patient Name: Matthew Dominguez DOB: 08/13/1951 MRN: [id number]   Date of Service  07/30/2021  HPI/Events of Note  At 7.10 pm I was notified of findings of MRI. Even though the MRI again was not an optimal study, findings of portal vein clot was noted. Liver enzymes actually improving in the last couple days. I spoke with RN. Patient is stable and has 2 JP drains - right sided drain with around 50 cc/hr of serosanguinous fluid and left with only around 35 cc all day. No frank bloody draining. Stable vitals. I called and spoke with the surgeon on call Dr Rica Koyanagi. Discussed above findings with him. He agrees with a heparin infusion with full dosing .   eICU Interventions  Orders placed pharmacy to consult on it.      Intervention Category Major Interventions: Sepsis - evaluation and management  Margaretmary Lombard 07/30/2021, 7:51 PM

## 2021-07-30 NOTE — Evaluation (Signed)
Occupational Therapy Evaluation Patient Details Name: Matthew Dominguez MRN: [id number] DOB: Aug 15, 1951 Today's Date: 07/30/2021   History of Present Illness 70 yo admitted 11/18 after found down at home with AMS. Pt with sepsis due to ruptured liver abscess s/p ex lap with drainage same date. Pt intubated 11/18-11/19. PMhx: DM, HTN, COPD, mild cognitive impairment   Clinical Impression   Pt admitted with above. He demonstrates the below listed deficits and will benefit from continued OT to maximize safety and independence with BADLs.  Pt presents to OT with generalized weakness, impaired balance, decreased activity tolerance, impaired cognition.  He currently requires total A for ADLs and mod A for bed mobility.  He was highly impulsive during session, and was unable to stand safely.  He fatigues quickly with activity.  PTA, pt lived with his spouse and reportedly was independent with ADLs.  Recommend SNF.         Recommendations for follow up therapy are one component of a multi-disciplinary discharge planning process, led by the attending physician.  Recommendations may be updated based on patient status, additional functional criteria and insurance authorization.   Follow Up Recommendations  Skilled nursing-short term rehab (<3 hours/day)    Assistance Recommended at Discharge Frequent or constant Supervision/Assistance  Functional Status Assessment  Patient has had a recent decline in their functional status and demonstrates the ability to make significant improvements in function in a reasonable and predictable amount of time.  Equipment Recommendations  None recommended by OT    Recommendations for Other Services       Precautions / Restrictions Precautions Precautions: Fall      Mobility Bed Mobility Overal bed mobility: Needs Assistance Bed Mobility: Rolling;Sidelying to Sit;Sit to Sidelying Rolling: Mod assist Sidelying to sit: Mod assist     Sit to sidelying: Mod  assist General bed mobility comments: asssist or sequencing, problem solving, to initiate task and to lift trunk.  He was able to lift LEs onto the bed    Transfers                   General transfer comment: unable to safely attempt      Balance Overall balance assessment: Needs assistance   Sitting balance-Leahy Scale: Poor Sitting balance - Comments: requires close min guard to min A for EOB sitting.  Pt restless and frequently scooting unsafely toward EOB requiring cues and assist to prevent scooting off the EOB                                   ADL either performed or assessed with clinical judgement   ADL Overall ADL's : Needs assistance/impaired Eating/Feeding: NPO   Grooming: Wash/dry hands;Wash/dry face;Maximal assistance;Sitting   Upper Body Bathing: Total assistance;Sitting   Lower Body Bathing: Total assistance;Bed level   Upper Body Dressing : Total assistance;Sitting   Lower Body Dressing: Total assistance;Bed level   Toilet Transfer: Total assistance Toilet Transfer Details (indicate cue type and reason): unable Toileting- Clothing Manipulation and Hygiene: Total assistance;Bed level       Functional mobility during ADLs: Moderate assistance (bed mobility only)       Vision         Perception     Praxis      Pertinent Vitals/Pain Pain Assessment: Faces Faces Pain Scale: Hurts little more Pain Location: abdomen with mobility Pain Descriptors / Indicators: Guarding Pain Intervention(s): Monitored during session  Hand Dominance  (uncertain)   Extremity/Trunk Assessment Upper Extremity Assessment Upper Extremity Assessment: Generalized weakness   Lower Extremity Assessment Lower Extremity Assessment: Generalized weakness   Cervical / Trunk Assessment Cervical / Trunk Assessment: Kyphotic   Communication Communication Communication: HOH   Cognition Arousal/Alertness: Awake/alert Behavior During Therapy:  Restless;Impulsive Overall Cognitive Status: Impaired/Different from baseline Area of Impairment: Orientation;Attention;Memory;Following commands;Safety/judgement;Awareness;Problem solving                 Orientation Level: Disoriented to;Time;Situation Current Attention Level: Focused   Following Commands: Follows one step commands inconsistently;Follows one step commands with increased time Safety/Judgement: Decreased awareness of safety;Decreased awareness of deficits   Problem Solving: Slow processing;Difficulty sequencing;Requires verbal cues;Requires tactile cues       General Comments  VSS pt on 4L supplemental 02    Exercises Exercises: Other exercises Other Exercises Other Exercises: performed 3-6 reps shoulder flexion bil and elbow flex/ext   Shoulder Instructions      Home Living Family/patient expects to be discharged to:: Private residence Living Arrangements: Spouse/significant other Available Help at Discharge: Family;Available PRN/intermittently Type of Home: House Home Access: Stairs to enter [employer] of Steps: 2   Home Layout: One level               Home Equipment: None   Additional Comments: info gleaned from chart review      Prior Functioning/Environment Prior Level of Function : Independent/Modified Independent               ADLs Comments: info gleaned from chart review        OT Problem List: Decreased strength;Decreased activity tolerance;Impaired balance (sitting and/or standing);Decreased cognition;Decreased safety awareness;Decreased knowledge of use of DME or AE;Pain      OT Treatment/Interventions: Self-care/ADL training;Therapeutic exercise;Energy conservation;DME and/or AE instruction;Therapeutic activities;Cognitive remediation/compensation;Patient/family education;Balance training    OT Goals(Current goals can be found in the care plan section) Acute Rehab OT Goals OT Goal Formulation: Patient  unable to participate in goal setting Time For Goal Achievement: 08/13/21 Potential to Achieve Goals: Good ADL Goals Pt Will Perform Grooming: with min assist;sitting Pt Will Perform Upper Body Bathing: with mod assist;sitting Pt Will Transfer to Toilet: with mod assist;squat pivot transfer;bedside commode Pt Will Perform Toileting - Clothing Manipulation and hygiene: with max assist;sit to/from stand Additional ADL Goal #1: Pt will actively participate in 15 mins therapeutic activity with no more than 2 rest breaks Additional ADL Goal #2: Pt will consistently follow 1 step commands  OT Frequency: Min 2X/week   Barriers to D/C:            Co-evaluation              AM-PAC OT "6 Clicks" Daily Activity     Outcome Measure Help from another person eating meals?: Total Help from another person taking care of personal grooming?: A Lot Help from another person toileting, which includes using toliet, bedpan, or urinal?: Total Help from another person bathing (including washing, rinsing, drying)?: Total Help from another person to put on and taking off regular upper body clothing?: Total Help from another person to put on and taking off regular lower body clothing?: Total 6 Click Score: 7   End of Session Equipment Utilized During Treatment: Oxygen Nurse Communication: Mobility status  Activity Tolerance: Patient limited by fatigue Patient left: in bed;with call bell/phone within reach;with bed alarm set;with nursing/sitter in room  OT Visit Diagnosis: Unsteadiness on feet (R26.81);Cognitive communication deficit (R41.841)  Time: 3614-4315 OT Time Calculation (min): 17 min Charges:  OT General Charges $OT Visit: 1 Visit OT Evaluation $OT Eval Moderate Complexity: 1 Mod  Nilsa Nutting., OTR/L Acute Rehabilitation Services Pager [phone] Office [phone]   Lucille Passy M 07/30/2021, 3:49 PM

## 2021-07-31 DIAGNOSIS — K750 Abscess of liver: Secondary | ICD-10-CM | POA: Diagnosis not present

## 2021-07-31 LAB — COMPREHENSIVE METABOLIC PANEL
ALT: 33 U/L (ref 0–44)
AST: 24 U/L (ref 15–41)
Albumin: <1.5 g/dL — ABNORMAL LOW (ref 3.5–5.0)
Alkaline Phosphatase: 124 U/L (ref 38–126)
Anion gap: 3 — ABNORMAL LOW (ref 5–15)
BUN: 18 mg/dL (ref 8–23)
CO2: 28 mmol/L (ref 22–32)
Calcium: 7.4 mg/dL — ABNORMAL LOW (ref 8.9–10.3)
Chloride: 111 mmol/L (ref 98–111)
Creatinine, Ser: 0.59 mg/dL — ABNORMAL LOW (ref 0.61–1.24)
GFR, Estimated: >60 mL/min (ref >60)
Glucose, Bld: 201 mg/dL — ABNORMAL HIGH (ref 70–99)
Potassium: 3.5 mmol/L (ref 3.5–5.1)
Sodium: 142 mmol/L (ref 135–145)
Total Bilirubin: 1.3 mg/dL — ABNORMAL HIGH (ref 0.3–1.2)
Total Protein: 5 g/dL — ABNORMAL LOW (ref 6.5–8.1)

## 2021-07-31 LAB — CBC
HCT: 35.5 % — ABNORMAL LOW (ref 39.0–52.0)
Hemoglobin: 11.6 g/dL — ABNORMAL LOW (ref 13.0–17.0)
MCH: 30.7 pg (ref 26.0–34.0)
MCHC: 32.7 g/dL (ref 30.0–36.0)
MCV: 93.9 fL (ref 80.0–100.0)
Platelets: 216 10*3/uL (ref 150–400)
RBC: 3.78 MIL/uL — ABNORMAL LOW (ref 4.22–5.81)
RDW: 14.5 % (ref 11.5–15.5)
WBC: 14 10*3/uL — ABNORMAL HIGH (ref 4.0–10.5)
nRBC: 0 % (ref 0.0–0.2)

## 2021-07-31 LAB — CULTURE, BLOOD (ROUTINE X 2)
Culture: NO GROWTH
Culture: NO GROWTH

## 2021-07-31 LAB — GLUCOSE, CAPILLARY
Glucose-Capillary: 162 mg/dL — ABNORMAL HIGH (ref 70–99)
Glucose-Capillary: 173 mg/dL — ABNORMAL HIGH (ref 70–99)
Glucose-Capillary: 177 mg/dL — ABNORMAL HIGH (ref 70–99)
Glucose-Capillary: 187 mg/dL — ABNORMAL HIGH (ref 70–99)
Glucose-Capillary: 197 mg/dL — ABNORMAL HIGH (ref 70–99)
Glucose-Capillary: 242 mg/dL — ABNORMAL HIGH (ref 70–99)

## 2021-07-31 LAB — HEPARIN LEVEL (UNFRACTIONATED)
Heparin Unfractionated: <0.1 IU/mL — ABNORMAL LOW (ref 0.30–0.70)
Heparin Unfractionated: <0.1 IU/mL — ABNORMAL LOW (ref 0.30–0.70)
Heparin Unfractionated: 0.15 IU/mL — ABNORMAL LOW (ref 0.30–0.70)

## 2021-07-31 LAB — TRIGLYCERIDES: Triglycerides: 131 mg/dL (ref <150)

## 2021-07-31 LAB — PHOSPHORUS: Phosphorus: 3 mg/dL (ref 2.5–4.6)

## 2021-07-31 LAB — MAGNESIUM: Magnesium: 1.8 mg/dL (ref 1.7–2.4)

## 2021-07-31 MED ORDER — HEPARIN BOLUS VIA INFUSION
2000.0000 [IU] | Freq: Once | INTRAVENOUS | Status: AC
  Administered 2021-07-31: 2000 [IU] via INTRAVENOUS
  Filled 2021-07-31: qty 2000

## 2021-07-31 MED ORDER — TRAVASOL 10 % IV SOLN
INTRAVENOUS | Status: AC
  Filled 2021-07-31: qty 936

## 2021-07-31 MED ORDER — MAGNESIUM SULFATE 2 GM/50ML IV SOLN
2.0000 g | Freq: Once | INTRAVENOUS | Status: AC
  Administered 2021-07-31: 2 g via INTRAVENOUS
  Filled 2021-07-31: qty 50

## 2021-07-31 MED ORDER — HEPARIN BOLUS VIA INFUSION
2500.0000 [IU] | Freq: Once | INTRAVENOUS | Status: AC
  Administered 2021-07-31: 2500 [IU] via INTRAVENOUS
  Filled 2021-07-31: qty 2500

## 2021-07-31 MED ORDER — POTASSIUM CHLORIDE 10 MEQ/50ML IV SOLN
10.0000 meq | INTRAVENOUS | Status: AC
  Administered 2021-07-31 (×4): 10 meq via INTRAVENOUS
  Filled 2021-07-31 (×4): qty 50

## 2021-07-31 NOTE — Progress Notes (Signed)
ANTICOAGULATION CONSULT NOTE - Follow Up Consult  Pharmacy Consult for heparin Indication:  portal vein thrombosis  Labs: Recent Labs    07/28/21 0425 07/29/21 0455 07/30/21 0408 07/30/21 2012 07/31/21 0343  HGB 12.0* 12.4* 12.8* 12.3* 11.6*  HCT 36.4* 38.9* 39.4 38.0* 35.5*  PLT 148* 198 259 252 216  APTT  --   --   --  29  --   LABPROT  --   --   --  17.6*  --   INR  --   --   --  1.4*  --   HEPARINUNFRC  --   --   --   --  <0.10*  CREATININE 0.74 0.73 0.69  --   --     Assessment: 70yo male subtherapeutic on heparin with initial dosing for portal vein thrombosis; no infusion issues or signs of bleeding per RN.  Goal of Therapy:  Heparin level 0.3-0.7 units/ml   Plan:  Will increase heparin infusion by 4 units/kg/hr to 1400 units/hr and check level in 6 hours.    Wynona Neat, PharmD, BCPS  07/31/2021,4:24 AM

## 2021-07-31 NOTE — Plan of Care (Addendum)
MRCP revealed portal vein thrombosis extending into portal venous bifurcation. Numerous hepatic abscesses visualized -Suspect liver abscess are a complication of pylephlebitis(treated with anticoagulation+antibiotics), would get GI involved -Will need atleast 4 weeks of antibiotics given multiple hepatic abscesses. May need further source control, Discuss with IR if any of lesions are drainable.

## 2021-07-31 NOTE — Progress Notes (Signed)
NAME:  Matthew Dominguez, MRN:  [id number], DOB:  1950/09/21, LOS: 5 ADMISSION DATE:  07/25/2021, CONSULTATION DATE:  11/18 REFERRING MD:  Roderic Palau, CHIEF COMPLAINT:  concern for sepsis   History of Present Illness:  70 y/o male presented to the ER on 11/18 with confusion, was found to have a liver abscess and pneumoperitoneum which required emergent ex-lap the same day.  He was admitted on 11/8 with sepsis and hepatitis.    Pertinent  Medical History  Asthma Cataract COPD Depression DM2 GERD HTN BPH Insomnia Hypothyroidism  Significant Hospital Events: Including procedures, antibiotic start and stop dates in addition to other pertinent events   11/18 went from ER to OR for exploratory laparotomy and drainage of pyogenic liver abscesses (2) and partial omentectomy 11/19 ID consulted, Extubated 11/20 Passed bedside swallow. ID consulted. 11/21 Confused, required mitts. Cleviprex gtt continues for hypertension. 11/22 Ongoing Cleviprex needs. O2 needs decreased. Still slow ROBF, consideration of TPN if unable to tolerate feeds. 11/22 cefepime discontinued, changed to ceftriaxone, metronidazole continued. 11/23 MRCP (could not complete study)> numerous hepatic abscesses with left portal vein thrombosis, fluid in abdomen may represent ascites, cholelithiasis, possible stone in cystic duct, bilatearl effusions, mild intrahepatic biliary ductal distension, generalized bowel edema   Interim History / Subjective:  OnTPN Off precedex Right abdominal drain put out ~800cc Left abdominal drain put out 50cc WBC down  Objective   Blood pressure (!) 149/46, pulse 90, temperature 98.2 F (36.8 C), temperature source Oral, resp. rate (!) 22, height 5\' 7"  (1.702 m), weight 86.6 kg, SpO2 94 %.        Intake/Output Summary (Last 24 hours) at 07/31/2021 0741 Last data filed at 07/31/2021 1517 Gross per 24 hour  Intake 1205.45 ml  Output 1925 ml  Net -719.55 ml   Filed Weights   07/30/21 0958  07/30/21 1900 07/31/21 0417  Weight: 73.7 kg 73.7 kg 86.6 kg    Examination:  General:  Chronically ill appearing, resting comfortably in bed HENT: NCAT OP clear, mucus membranes dry PULM: Rhonchi bilaterally B, normal effort CV: RRR, no mgr GI: BS present, infrequent; ventral wound dressing in place c/d/I; right and left abdominal drains in place MSK: normal bulk and tone Neuro: awake and conversant but confused, moves all four extremities  Resolved Hospital Problem list     Assessment & Plan:  Sepsis secondary to ruptured pyogenic liver abscesses S/p ex-lap and partial omentectomy 11/18 Unable to complete MRCP, per surgery consider repeat CT abdomen if clinical picture worsens Continue ceftriaxone and flagyl  Hepatitis from ruptured pyogenic liver abscesses> improved Lft prn  Portal vein thrombosis Heparin infusion per pharmacy protocol  Hypertension > limited ability to take po with ileus Monitor in ICU setting Bisoprolol amlodipine Wean off cleviprex for SBP < 180   Post operative ileus At risk for malnutrition TPN per pharmacy  DM2 SSI and glargine  Dementia with acute metabolic encephalopathy, sundowning Seroquel Frequent orientation No sedatives  COPD Weak cough, some respiratory secretions 11/24 Pulmicort Duoneb Flutter valve Guaifenesin   Best Practice (right click and "Reselect all SmartList Selections" daily)   Diet/type: TPN DVT prophylaxis: systemic heparin GI prophylaxis: N/A Lines: Central line and yes and it is still needed Foley:  N/A Code Status:  full code Last date of multidisciplinary goals of care discussion [11/21]  Labs   CBC: Recent Labs  Lab 07/25/21 1527 07/25/21 2325 07/28/21 0425 07/29/21 0455 07/30/21 0408 07/30/21 2012 07/31/21 0343  WBC 25.7*   < > 14.9*  15.4* 21.7* 18.4* 14.0*  NEUTROABS 23.1*  --   --   --   --   --   --   HGB 14.9   < > 12.0* 12.4* 12.8* 12.3* 11.6*  HCT 44.2   < > 36.4* 38.9* 39.4 38.0*  35.5*  MCV 91.1   < > 92.6 94.6 93.4 93.6 93.9  PLT 95*   < > 148* 198 259 252 216   < > = values in this interval not displayed.    Basic Metabolic Panel: Recent Labs  Lab 07/27/21 0420 07/27/21 0545 07/27/21 0756 07/28/21 0425 07/29/21 0455 07/30/21 0408 07/31/21 0343  NA 143 145  --  142 139 143 142  K 3.3* 3.2*  --  3.6 3.8 3.7 3.5  CL 111  --   --  112* 112* 114* 111  CO2 25  --   --  25 20* 23 28  GLUCOSE 185*  --   --  175* 162* 123* 201*  BUN 30*  --   --  24* 22 19 18   CREATININE 0.86  --   --  0.74 0.73 0.69 0.59*  CALCIUM 8.3*  --   --  7.9* 7.6* 7.8* 7.4*  MG  --   --  2.1 2.1 1.9 2.1 1.8  PHOS  --   --   --   --  3.0 3.5 3.0   GFR: Estimated Creatinine Clearance: 87.7 mL/min (A) (by C-G formula based on SCr of 0.59 mg/dL (L)). Recent Labs  Lab 07/25/21 1530 07/25/21 1830 07/26/21 0111 07/26/21 0113 07/29/21 0455 07/30/21 0408 07/30/21 2012 07/31/21 0343  WBC  --   --   --    < > 15.4* 21.7* 18.4* 14.0*  LATICACIDVEN 1.9 1.6 1.3  --   --   --   --   --    < > = values in this interval not displayed.    Liver Function Tests: Recent Labs  Lab 07/27/21 0420 07/28/21 0425 07/29/21 0455 07/30/21 0408 07/31/21 0343  AST 43* 45* 28 24 24   ALT 103* 78* 56* 46* 33  ALKPHOS 127* 108 110 121 124  BILITOT 2.1* 1.3* 1.4* 1.2 1.3*  PROT 5.2* 5.1* 5.1* 5.5* 5.0*  ALBUMIN <1.5* <1.5* <1.5* <1.5* <1.5*   Recent Labs  Lab 07/25/21 1527 07/27/21 0420  LIPASE 30 36   Recent Labs  Lab 07/25/21 1647  AMMONIA 20    ABG    Component Value Date/Time   PHART 7.372 07/27/2021 0545   PCO2ART 44.2 07/27/2021 0545   PO2ART 73 (L) 07/27/2021 0545   HCO3 25.3 07/27/2021 0545   TCO2 27 07/27/2021 0545   ACIDBASEDEF 1.0 07/26/2021 0039   O2SAT 93.0 07/27/2021 0545     Coagulation Profile: Recent Labs  Lab 07/25/21 1647 07/30/21 2012  INR 1.3* 1.4*    Cardiac Enzymes: Recent Labs  Lab 07/25/21 1527  CKTOTAL 406*    HbA1C: Hgb A1c MFr Bld   Date/Time Value Ref Range Status  04/18/2021 01:53 PM 12.2 (H) <5.7 % of total Hgb Final    Comment:    For someone without known diabetes, a hemoglobin A1c value of 6.5% or greater indicates that they may have  diabetes and this should be confirmed with a follow-up  test. . For someone with known diabetes, a value <7% indicates  that their diabetes is well controlled and a value  greater than or equal to 7% indicates suboptimal  control. A1c targets should be individualized  based on  duration of diabetes, age, comorbid conditions, and  other considerations. . Currently, no consensus exists regarding use of hemoglobin A1c for diagnosis of diabetes for children. Marland Kitchen   12/18/2020 09:53 AM 12.5 (H) <5.7 % of total Hgb Final    Comment:    For someone without known diabetes, a hemoglobin A1c value of 6.5% or greater indicates that they may have  diabetes and this should be confirmed with a follow-up  test. . For someone with known diabetes, a value <7% indicates  that their diabetes is well controlled and a value  greater than or equal to 7% indicates suboptimal  control. A1c targets should be individualized based on  duration of diabetes, age, comorbid conditions, and  other considerations. . Currently, no consensus exists regarding use of hemoglobin A1c for diagnosis of diabetes for children. .     CBG: Recent Labs  Lab 07/30/21 1537 07/30/21 1940 07/30/21 2336 07/31/21 0318 07/31/21 0706  GLUCAP 114* 111* 169* 197* 162*    Critical care time: 31 minutes     Roselie Awkward, MD [facility] Pager: [phone] Cell: [phone] After 7:00 pm call Elink  [phone]

## 2021-07-31 NOTE — Progress Notes (Signed)
ANTICOAGULATION CONSULT NOTE - Initial Consult  Pharmacy Consult for Heparin Indication: Portal Vein Thrombosis   No Known Allergies  Patient Measurements: Height: 5\' 7"  (170.2 cm) Weight: 86.6 kg (190 lb 14.7 oz) IBW/kg (Calculated) : 66.1 Heparin Dosing Weight: 73 kg   Vital Signs: Temp: 98.3 F (36.8 C) (11/24 1139) Temp Source: Oral (11/24 1139) BP: 173/65 (11/24 1100) Pulse Rate: 98 (11/24 1100)  Labs: Recent Labs    07/29/21 0455 07/30/21 0408 07/30/21 2012 07/31/21 0343 07/31/21 1034  HGB 12.4* 12.8* 12.3* 11.6*  --   HCT 38.9* 39.4 38.0* 35.5*  --   PLT 198 259 252 216  --   APTT  --   --  29  --   --   LABPROT  --   --  17.6*  --   --   INR  --   --  1.4*  --   --   HEPARINUNFRC  --   --   --  <0.10* <0.10*  CREATININE 0.73 0.69  --  0.59*  --      Estimated Creatinine Clearance: 87.7 mL/min (A) (by C-G formula based on SCr of 0.59 mg/dL (L)).   Medical History: Past Medical History:  Diagnosis Date   Abnormalities of the hair    Allergic rhinitis, cause unspecified    Allergy    Anxiety    Arthritis    Asthma    Backache, unspecified    Broken ribs 11/2015   Cataract    Cervicalgia    Colon polyps    COPD (chronic obstructive pulmonary disease) ([place])    Depression    Diabetes mellitus    Encounter for long-term (current) use of other medications    Family history of breast cancer    Full dentures    GERD (gastroesophageal reflux disease)    Hypertension    Hypertrophy of prostate without urinary obstruction and other lower urinary tract symptoms (LUTS)    Incomplete bladder emptying    Insomnia, unspecified    Neoplasm of uncertain behavior of skin    Nystagmus, unspecified    Other and unspecified hyperlipidemia    Other premature beats    Pyogenic granuloma of skin and subcutaneous tissue    Seasonal allergies    Special screening for malignant neoplasm of prostate    Tachycardia, unspecified    Type I (juvenile type) diabetes  mellitus without mention of complication, uncontrolled    Unspecified arthropathy, shoulder region    Unspecified asthma(493.90)    Unspecified essential hypertension    Unspecified hypothyroidism    Urinary frequency    Wears glasses    Assessment: 70 yo male presented on 07/25/2021 after being found down with AMS found to have perforated liver abscess s/p partial omentectomy and drain placement. MRI with suspected portal vein thrombosis. Results of MRI dicussed with CCM and surgery. Pharmacy consulted to dose heparin. H  Heparin level undetectable  Goal of Therapy:  Heparin level 0.3-0.7 units/ml Monitor platelets by anticoagulation protocol: Yes   Plan:  Give Heparin 2500 units x 1 Incr heparin infusion at 1750 units/hr  Check 8 hr heparin level  Monitor heparin level, CBC and s/s of bleeding   Alanda Slim, PharmD, [facility] Clinical Pharmacist Please see AMION for all Pharmacists' Contact Phone Numbers 07/31/2021, 11:44 AM

## 2021-07-31 NOTE — Progress Notes (Addendum)
PHARMACY - TOTAL PARENTERAL NUTRITION CONSULT NOTE   Indication: Prolonged ileus  Patient Measurements: Height: 5\' 7"  (170.2 cm) Weight: 86.6 kg (190 lb 14.7 oz) IBW/kg (Calculated) : 66.1 TPN AdjBW (KG): 73.7 Body mass index is 29.9 kg/m.  Assessment: 73 yom with perforated liver abscess status post partial omentectomy and drains in the right and left upper quadrants.  Speech evaluated for ability to swallow and deemed moderate aspiration risk with NPO status. KUB on 11/21 with possible ileus vs obstructive with worsening on 11/22 imaging. CCM wanted to hold off on TPN start 11/22 and push bowel regimen - BM documented but not significant, smear only per CCM. Pharmacy consulted to initiate TPN 11/23.  Glucose / Insulin: hx uncontrolled DM2. A1c 12.2% (Lantus 58 units QHS + Novolog 18 units qAC + metformin PTA but unclear compliance). CBGs controlled 116-154 on Semglee (insulin glargine-yfgn) 7 units BID + 6 units moderate SSI required in last 24hrs  Electrolytes: K 3.5 (goal >/=4 with ileus), Cl 111, Mg up to1 .8 (goal >/=2 with ileus), others WNL Renal: SCr stable wnl. BUN WNL.  Hepatic: ALT/AST WNL. Tbili stable. Prealbumin <5, albumin <1.5  Intake / Output; MIVF: NGT unable to be placed so far. Drain output 875 ml/24hrs, LBM 11/22 (smear only), UOP 0.5 mL/kg/hr.  GI Imaging: 11/22 [place] and MR of abdomen - interval worsening of small bowel dilation suggesting ileus or partial obstruction GI Surgeries / Procedures: none since consult for TPN   Central access: PICC planned 11/23 TPN start date: 11/23  Nutritional Goals: Goal TPN rate is 85 mL/hr (provides 112 g of protein and 2100 kcals per day)   RD Assessment: Estimated Needs Total Energy Estimated Needs: 2200-2400 Total Protein Estimated Needs: 125-145 grams Total Fluid Estimated Needs: >2L  Current Nutrition:  NPO  Plan:  -Increase TPN to 65 mL/hr at 1800. Titrate to goal (85 ml/hr) as appropriate. Showing signs of possible  mild refeeding. -TPN will provide 94 g protein and 1520 kCal, meeting 78% of protein and 69% of kCal needs.  -Electrolytes in TPN: initiate standard lytes except Cl:Ac ratio - Na 56mEq/L, K 23mEq/L, Ca 25mEq/L, Mg 10mEq/L, and Phos 21mmol/L. Cl:Ac 1:2 -Give K runs x 3 (goal >/=4 with ileus) -Add standard MVI and trace elements to TPN. -Add Pepcid 40mg  to TPN (d/c current IV order) -Continue current regimen of Semglee 7 units BID and Moderate q4h SSI for now and increase to 15 units regular insulin to TPN bag (conservative amount added to account for TPN dextrose content). F/u CBGs and adjust as needed. KCL 10 meq IV x 4 Mag 2 gms IV x 1 -Monitor TPN labs Friday while titrating up and on Mon/Thurs -F/u resolution of ileus, ability to tolerate PO   Alanda Slim, PharmD, [facility] Clinical Pharmacist Please see AMION for all Pharmacists' Contact Phone Numbers 07/31/2021, 7:19 AM

## 2021-07-31 NOTE — TOC Progression Note (Signed)
Transition of Care [facility]) - Progression Note    Patient Details  Name: Matthew Dominguez MRN: [id number] Date of Birth: 1951/07/21  Transition of Care [facility]) CM/SW [place],  Phone Number: 07/31/2021, 9:47 AM  Clinical Narrative:   CSW acknowledging consult for discharge planning on Lovenox. Current plan is SNF; SNF can handle Lovenox for patient. CSW to follow.         Expected Discharge Plan and Services                                                 Social Determinants of Health (SDOH) Interventions    Readmission Risk Interventions No flowsheet data found.
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
## 2021-07-31 NOTE — Progress Notes (Signed)
ANTICOAGULATION CONSULT NOTE  Pharmacy Consult for Heparin Indication: Portal Vein Thrombosis   No Known Allergies  Patient Measurements: Height: 5\' 7"  (170.2 cm) Weight: 86.6 kg (190 lb 14.7 oz) IBW/kg (Calculated) : 66.1 Heparin Dosing Weight: 73 kg   Vital Signs: Temp: 98.2 F (36.8 C) (11/24 1900) Temp Source: Oral (11/24 1900) BP: 140/46 (11/24 2000) Pulse Rate: 94 (11/24 2000)  Labs: Recent Labs    07/29/21 0455 07/30/21 0408 07/30/21 2012 07/31/21 0343 07/31/21 1034 07/31/21 2156  HGB 12.4* 12.8* 12.3* 11.6*  --   --   HCT 38.9* 39.4 38.0* 35.5*  --   --   PLT 198 259 252 216  --   --   APTT  --   --  Matthew  --   --   --   LABPROT  --   --  17.6*  --   --   --   INR  --   --  1.4*  --   --   --   HEPARINUNFRC  --   --   --  <0.10* <0.10* 0.15*  CREATININE 0.73 0.69  --  0.59*  --   --      Estimated Creatinine Clearance: 87.7 mL/min (A) (by C-G formula based on SCr of 0.59 mg/dL (L)).   Assessment: 70 yo Dominguez presented on 07/25/2021 after being found down with AMS found to have perforated liver abscess s/p partial omentectomy and drain placement. MRI with suspected portal vein thrombosis. Results of MRI dicussed with CCM and surgery. Pharmacy consulted to dose heparin.   Heparin level remains subtherapeutic (0.15) but finally trending up on 1750 units/hr. No issues with line or bleeding reported per RN.  Goal of Therapy:  Heparin level 0.3-0.7 units/ml Monitor platelets by anticoagulation protocol: Yes   Plan:  Rebolus heparin 2000 units x 1 Increase heparin infusion to 1950 units/hr  Check 8 hr heparin level   Sherlon Handing, PharmD, BCPS Please see amion for complete clinical pharmacist phone list 07/31/2021, 10:57 PM

## 2021-08-01 DIAGNOSIS — K750 Abscess of liver: Secondary | ICD-10-CM | POA: Diagnosis not present

## 2021-08-01 LAB — CBC
HCT: 34 % — ABNORMAL LOW (ref 39.0–52.0)
Hemoglobin: 10.9 g/dL — ABNORMAL LOW (ref 13.0–17.0)
MCH: 30.9 pg (ref 26.0–34.0)
MCHC: 32.1 g/dL (ref 30.0–36.0)
MCV: 96.3 fL (ref 80.0–100.0)
Platelets: 239 10*3/uL (ref 150–400)
RBC: 3.53 MIL/uL — ABNORMAL LOW (ref 4.22–5.81)
RDW: 14.6 % (ref 11.5–15.5)
WBC: 13.2 10*3/uL — ABNORMAL HIGH (ref 4.0–10.5)
nRBC: 0 % (ref 0.0–0.2)

## 2021-08-01 LAB — GLUCOSE, CAPILLARY
Glucose-Capillary: 185 mg/dL — ABNORMAL HIGH (ref 70–99)
Glucose-Capillary: 181 mg/dL — ABNORMAL HIGH (ref 70–99)
Glucose-Capillary: 228 mg/dL — ABNORMAL HIGH (ref 70–99)
Glucose-Capillary: 219 mg/dL — ABNORMAL HIGH (ref 70–99)
Glucose-Capillary: 160 mg/dL — ABNORMAL HIGH (ref 70–99)
Glucose-Capillary: 179 mg/dL — ABNORMAL HIGH (ref 70–99)

## 2021-08-01 LAB — BASIC METABOLIC PANEL
Anion gap: 3 — ABNORMAL LOW (ref 5–15)
BUN: 18 mg/dL (ref 8–23)
CO2: 28 mmol/L (ref 22–32)
Calcium: 7.4 mg/dL — ABNORMAL LOW (ref 8.9–10.3)
Chloride: 112 mmol/L — ABNORMAL HIGH (ref 98–111)
Creatinine, Ser: 0.68 mg/dL (ref 0.61–1.24)
GFR, Estimated: >60 mL/min (ref >60)
Glucose, Bld: 207 mg/dL — ABNORMAL HIGH (ref 70–99)
Potassium: 3.7 mmol/L (ref 3.5–5.1)
Sodium: 143 mmol/L (ref 135–145)

## 2021-08-01 LAB — PHOSPHORUS: Phosphorus: 2.9 mg/dL (ref 2.5–4.6)

## 2021-08-01 LAB — MAGNESIUM: Magnesium: 2 mg/dL (ref 1.7–2.4)

## 2021-08-01 LAB — HEPARIN LEVEL (UNFRACTIONATED)
Heparin Unfractionated: 0.28 IU/mL — ABNORMAL LOW (ref 0.30–0.70)
Heparin Unfractionated: 0.18 IU/mL — ABNORMAL LOW (ref 0.30–0.70)

## 2021-08-01 MED ORDER — IPRATROPIUM-ALBUTEROL 0.5-2.5 (3) MG/3ML IN SOLN
3.0000 mL | Freq: Two times a day (BID) | RESPIRATORY_TRACT | Status: DC
  Administered 2021-08-01 – 2021-08-08 (×14): 3 mL via RESPIRATORY_TRACT
  Filled 2021-08-01 (×13): qty 3

## 2021-08-01 MED ORDER — TRAVASOL 10 % IV SOLN
INTRAVENOUS | Status: AC
  Filled 2021-08-01: qty 936

## 2021-08-01 MED ORDER — POTASSIUM CHLORIDE 10 MEQ/50ML IV SOLN
10.0000 meq | INTRAVENOUS | Status: AC
  Administered 2021-08-01 (×4): 10 meq via INTRAVENOUS
  Filled 2021-08-01 (×4): qty 50

## 2021-08-01 MED ORDER — POLYETHYLENE GLYCOL 3350 17 G PO PACK
17.0000 g | PACK | Freq: Two times a day (BID) | ORAL | Status: DC
  Filled 2021-08-01: qty 1

## 2021-08-01 MED ORDER — INFLUENZA VAC A&B SA ADJ QUAD 0.5 ML IM PRSY
0.5000 mL | PREFILLED_SYRINGE | INTRAMUSCULAR | Status: DC | PRN
  Filled 2021-08-01: qty 0.5

## 2021-08-01 NOTE — Progress Notes (Signed)
[facility] PCCM  I updated his daughter at length today. She notes that he drank heavily for years, quit 2 years ago. I let them know that he has made a good recovery so far, but it's very likely this will take weeks to months to recover.  Will plan transfer to [facility], progressive care.  Roselie Awkward, MD [place] [facility] Pager: [phone] Cell: [phone] After 7:00 pm call [place]  [phone]

## 2021-08-01 NOTE — Progress Notes (Signed)
ANTICOAGULATION CONSULT NOTE  Pharmacy Consult for Heparin Indication: Portal Vein Thrombosis   No Known Allergies  Patient Measurements: Height: 5\' 7"  (170.2 cm) Weight:  (bed malfunction. only appropriate items on bed with pt and wt. still unrealistically out of range.) IBW/kg (Calculated) : 66.1 Heparin Dosing Weight: 73 kg   Vital Signs: Temp: 97.9 F (36.6 C) (11/25 1538) Temp Source: Oral (11/25 1538) BP: 147/60 (11/25 0700) Pulse Rate: 91 (11/25 0700)  Labs: Recent Labs    07/30/21 0408 07/30/21 2012 07/31/21 0343 07/31/21 1034 07/31/21 2156 08/01/21 0300 08/01/21 0646 08/01/21 1700  HGB 12.8* 12.3* 11.6*  --   --  10.9*  --   --   HCT 39.4 38.0* 35.5*  --   --  34.0*  --   --   PLT 259 252 216  --   --  239  --   --   APTT  --  29  --   --   --   --   --   --   LABPROT  --  17.6*  --   --   --   --   --   --   INR  --  1.4*  --   --   --   --   --   --   HEPARINUNFRC  --   --  <0.10*   < > 0.15*  --  0.28* 0.18*  CREATININE 0.69  --  0.59*  --   --  0.68  --   --    < > = values in this interval not displayed.     Estimated Creatinine Clearance: 87.7 mL/min (by C-G formula based on SCr of 0.68 mg/dL).   Assessment: 70 yo male presented on 07/25/2021 after being found down with AMS found to have perforated liver abscess s/p partial omentectomy and drain placement. MRI with suspected portal vein thrombosis. Results of MRI dicussed with CCM and surgery. Pharmacy consulted to dose heparin.   Heparin level remains subtherapeutic (0.28) but trending up after bolus and increase to 1950 units/hr. No issues with line or bleeding reported per RN. >> heparin level remains subtherapeutic this evening (0.18), there was a IV site issue where access was lost for about an hour at ~1000 this AM.   Goal of Therapy:  Heparin level 0.3-0.7 units/ml Monitor platelets by anticoagulation protocol: Yes   Plan: Increase heparin infusion to 2200 units/hr (~1 unit/kg/hr) Check 8  hr heparin level  Monitor daily HL and CBC   Joetta Manners, PharmD, [facility] Emergency Medicine Clinical Pharmacist ED RPh Phone: [place]: [phone]

## 2021-08-01 NOTE — Progress Notes (Signed)
ANTICOAGULATION CONSULT NOTE  Pharmacy Consult for Heparin Indication: Portal Vein Thrombosis   No Known Allergies  Patient Measurements: Height: 5\' 7"  (170.2 cm) Weight:  (bed malfunction. only appropriate items on bed with pt and wt. still unrealistically out of range.) IBW/kg (Calculated) : 66.1 Heparin Dosing Weight: 73 kg   Vital Signs: Temp: 98.5 F (36.9 C) (11/25 0741) Temp Source: Oral (11/25 0741) BP: 147/60 (11/25 0700) Pulse Rate: 91 (11/25 0700)  Labs: Recent Labs    07/30/21 0408 07/30/21 2012 07/30/21 2012 07/31/21 0343 07/31/21 1034 07/31/21 2156 08/01/21 0300 08/01/21 0646  HGB 12.8* 12.3*  --  11.6*  --   --  10.9*  --   HCT 39.4 38.0*  --  35.5*  --   --  34.0*  --   PLT 259 252  --  216  --   --  239  --   APTT  --  29  --   --   --   --   --   --   LABPROT  --  17.6*  --   --   --   --   --   --   INR  --  1.4*  --   --   --   --   --   --   HEPARINUNFRC  --   --    < > <0.10* <0.10* 0.15*  --  0.28*  CREATININE 0.69  --   --  0.59*  --   --  0.68  --    < > = values in this interval not displayed.    Estimated Creatinine Clearance: 87.7 mL/min (by C-G formula based on SCr of 0.68 mg/dL).   Assessment: 70 yo male presented on 07/25/2021 after being found down with AMS found to have perforated liver abscess s/p partial omentectomy and drain placement. MRI with suspected portal vein thrombosis. Results of MRI dicussed with CCM and surgery. Pharmacy consulted to dose heparin.   Heparin level remains subtherapeutic (0.28) but trending up after bolus and increase to 1950 units/hr. No issues with line or bleeding reported per RN.  Goal of Therapy:  Heparin level 0.3-0.7 units/ml Monitor platelets by anticoagulation protocol: Yes   Plan:  Increase heparin infusion to 2100 units/hr  Check 8 hr heparin level   Sloan Leiter, PharmD, BCPS, BCCCP Clinical Pharmacist Please refer to [facility] for [place] numbers 08/01/2021, 7:42 AM

## 2021-08-01 NOTE — Progress Notes (Signed)
NAME:  Matthew Dominguez, MRN:  [id number], DOB:  1951/06/10, LOS: 47 ADMISSION DATE:  07/25/2021, CONSULTATION DATE:  11/18 REFERRING MD:  Roderic Palau, CHIEF COMPLAINT:  concern for sepsis   History of Present Illness:  70 y/o male presented to the ER on 11/18 with confusion, was found to have a liver abscess and pneumoperitoneum which required emergent ex-lap the same day.  He was admitted on 11/8 with sepsis and hepatitis.    Pertinent  Medical History  Asthma Cataract COPD Depression DM2 GERD HTN BPH Insomnia Hypothyroidism  Significant Hospital Events: Including procedures, antibiotic start and stop dates in addition to other pertinent events   11/18 went from ER to OR for exploratory laparotomy and drainage of pyogenic liver abscesses (2) and partial omentectomy 11/19 ID consulted, Extubated 11/20 Passed bedside swallow. ID consulted. 11/21 Confused, required mitts. Cleviprex gtt continues for hypertension. 11/22 Ongoing Cleviprex needs. O2 needs decreased. Still slow ROBF, consideration of TPN if unable to tolerate feeds. 11/22 cefepime discontinued, changed to ceftriaxone, metronidazole continued. 11/23 MRCP (could not complete study)> numerous hepatic abscesses with left portal vein thrombosis, fluid in abdomen may represent ascites, cholelithiasis, possible stone in cystic duct, bilatearl effusions, mild intrahepatic biliary ductal distension, generalized bowel edema   Interim History / Subjective:   No acute events K replaced this morning   Objective   Blood pressure (!) 147/60, pulse 91, temperature 97.9 F (36.6 C), temperature source Axillary, resp. rate (!) 27, height 5\' 7"  (1.702 m), weight 86.6 kg, SpO2 94 %.        Intake/Output Summary (Last 24 hours) at 08/01/2021 7253 Last data filed at 08/01/2021 0600 Gross per 24 hour  Intake 1942.27 ml  Output 1975 ml  Net -32.73 ml   Filed Weights   07/30/21 0958 07/30/21 1900 07/31/21 0417  Weight: 73.7 kg 73.7 kg  86.6 kg    Examination:  General:  Elderly male, confused, resting comfortably in bed HENT: NCAT OP clear mucus membranes dry PULM: CTA B, normal effort CV: RRR, no mgr GI: infrequent bowel sounds, drains, dressings in place MSK: normal bulk and tone Neuro: awake, alert, no distress, MAEW   Resolved Hospital Problem list     Assessment & Plan:  Sepsis secondary to ruptured pyogenic liver abscesses > improved S/p ex-lap and partial omentectomy 11/18 Given clinical improvement, continue current management with antibiotics and heparin ABx duration per ID Repeat imaging or GI consult if clinical picture changes  Hepatitis from ruptured pyogenic liver abscesses> improved LFT prn  Portal vein thrombosis Heparin per pharmacy  Hypertension > limited ability to take po with ileus D/c cleviprex Bisoprolol, losartan, amlodipine to continue   Post operative ileus > improving Enema/nutrition per surgery TPN per protocol  DM2 SSI, insulin in his TPN, and semglee  Acute metabolic encephalopathy, sundowning > improving but still present on 11/25 Daughter has suspected dementia in the past but he has never been diagnosed Seroquel Frequent orientation Hold sedatives Lights on during daytime, off during  COPD  Weak cough, some respiratory secretions 11/24 > improved 11/25 Pulmicort Duoneb Flutter valve Guaifenesin   Best Practice (right click and "Reselect all SmartList Selections" daily)   Diet/type: TPN DVT prophylaxis: systemic heparin GI prophylaxis: N/A Lines: Central line and yes and it is still needed Foley:  N/A Code Status:  full code Last date of multidisciplinary goals of care discussion [11/21]  Labs   CBC: Recent Labs  Lab 07/25/21 1527 07/25/21 2325 07/29/21 0455 07/30/21 0408 07/30/21 2012 07/31/21  0343 08/01/21 0300  WBC 25.7*   < > 15.4* 21.7* 18.4* 14.0* 13.2*  NEUTROABS 23.1*  --   --   --   --   --   --   HGB 14.9   < > 12.4* 12.8* 12.3*  11.6* 10.9*  HCT 44.2   < > 38.9* 39.4 38.0* 35.5* 34.0*  MCV 91.1   < > 94.6 93.4 93.6 93.9 96.3  PLT 95*   < > 198 259 252 216 239   < > = values in this interval not displayed.    Basic Metabolic Panel: Recent Labs  Lab 07/28/21 0425 07/29/21 0455 07/30/21 0408 07/31/21 0343 08/01/21 0300  NA 142 139 143 142 143  K 3.6 3.8 3.7 3.5 3.7  CL 112* 112* 114* 111 112*  CO2 25 20* 23 28 28   GLUCOSE 175* 162* 123* 201* 207*  BUN 24* 22 19 18 18   CREATININE 0.74 0.73 0.69 0.59* 0.68  CALCIUM 7.9* 7.6* 7.8* 7.4* 7.4*  MG 2.1 1.9 2.1 1.8 2.0  PHOS  --  3.0 3.5 3.0 2.9   GFR: Estimated Creatinine Clearance: 87.7 mL/min (by C-G formula based on SCr of 0.68 mg/dL). Recent Labs  Lab 07/25/21 1530 07/25/21 1830 07/26/21 0111 07/26/21 0113 07/30/21 0408 07/30/21 2012 07/31/21 0343 08/01/21 0300  WBC  --   --   --    < > 21.7* 18.4* 14.0* 13.2*  LATICACIDVEN 1.9 1.6 1.3  --   --   --   --   --    < > = values in this interval not displayed.    Liver Function Tests: Recent Labs  Lab 07/27/21 0420 07/28/21 0425 07/29/21 0455 07/30/21 0408 07/31/21 0343  AST 43* 45* 28 24 24   ALT 103* 78* 56* 46* 33  ALKPHOS 127* 108 110 121 124  BILITOT 2.1* 1.3* 1.4* 1.2 1.3*  PROT 5.2* 5.1* 5.1* 5.5* 5.0*  ALBUMIN <1.5* <1.5* <1.5* <1.5* <1.5*   Recent Labs  Lab 07/25/21 1527 07/27/21 0420  LIPASE 30 36   Recent Labs  Lab 07/25/21 1647  AMMONIA 20    ABG    Component Value Date/Time   PHART 7.372 07/27/2021 0545   PCO2ART 44.2 07/27/2021 0545   PO2ART 73 (L) 07/27/2021 0545   HCO3 25.3 07/27/2021 0545   TCO2 27 07/27/2021 0545   ACIDBASEDEF 1.0 07/26/2021 0039   O2SAT 93.0 07/27/2021 0545     Coagulation Profile: Recent Labs  Lab 07/25/21 1647 07/30/21 2012  INR 1.3* 1.4*    Cardiac Enzymes: Recent Labs  Lab 07/25/21 1527  CKTOTAL 406*    HbA1C: Hgb A1c MFr Bld  Date/Time Value Ref Range Status  04/18/2021 01:53 PM 12.2 (H) <5.7 % of total Hgb Final     Comment:    For someone without known diabetes, a hemoglobin A1c value of 6.5% or greater indicates that they may have  diabetes and this should be confirmed with a follow-up  test. . For someone with known diabetes, a value <7% indicates  that their diabetes is well controlled and a value  greater than or equal to 7% indicates suboptimal  control. A1c targets should be individualized based on  duration of diabetes, age, comorbid conditions, and  other considerations. . Currently, no consensus exists regarding use of hemoglobin A1c for diagnosis of diabetes for children. Marland Kitchen   12/18/2020 09:53 AM 12.5 (H) <5.7 % of total Hgb Final    Comment:    For someone without  known diabetes, a hemoglobin A1c value of 6.5% or greater indicates that they may have  diabetes and this should be confirmed with a follow-up  test. . For someone with known diabetes, a value <7% indicates  that their diabetes is well controlled and a value  greater than or equal to 7% indicates suboptimal  control. A1c targets should be individualized based on  duration of diabetes, age, comorbid conditions, and  other considerations. . Currently, no consensus exists regarding use of hemoglobin A1c for diagnosis of diabetes for children. .     CBG: Recent Labs  Lab 07/31/21 1138 07/31/21 1541 07/31/21 1924 07/31/21 2332 08/01/21 0311  GLUCAP 187* 177* 242* 173* 185*    Critical care time: n/a     Roselie Awkward, MD [facility] Pager: [phone] Cell: [phone] After 7:00 pm call Elink  [phone]

## 2021-08-01 NOTE — Progress Notes (Signed)
Physical Therapy Treatment Patient Details Name: Matthew Dominguez MRN: [id number] DOB: [birth year] Today's Date: 08/01/2021   History of Present Illness 70 yo admitted 11/18 after found down at home with AMS. Pt with sepsis due to ruptured liver abscess s/p ex lap with drainage same date. Pt intubated 11/18-11/19. PMhx: DM, HTN, COPD, mild cognitive impairment    PT Comments    Pt starting to show cognitive improvements and progress toward goals.  Emphasis on transitions to EOB, sitting balance, sit to stand/standing balance and stepping up toward HOB, all at moderate assist, +2 assist helpful OOB.    Recommendations for follow up therapy are one component of a multi-disciplinary discharge planning process, led by the attending physician.  Recommendations may be updated based on patient status, additional functional criteria and insurance authorization.  Follow Up Recommendations  Skilled nursing-short term rehab (<3 hours/day)     Assistance Recommended at Discharge Frequent or [facility] bed;Wheelchair (measurements PT);Wheelchair cushion (measurements PT);Other (comment) (may not ultimately need these as pt progresses)    Recommendations for Other Services       Precautions / Restrictions Precautions Precautions: Fall Precaution Comments: watch sats,     Mobility  Bed Mobility Overal bed mobility: Needs Assistance Bed Mobility: Rolling;Supine to Sit;Sit to Sidelying Rolling: Mod assist Sidelying to sit: Mod assist   Sit to supine: Mod assist   General bed mobility comments: cues for sequencing, truncal assist up/down via R elbow    Transfers Overall transfer level: Needs assistance   Transfers: Sit to/from Stand Sit to Stand: Mod assist           General transfer comment: x2 with face to face assist to help forward and with boost.  One LE guarded for safety    Ambulation/Gait Ambulation/Gait assistance:  Mod assist Gait Distance (Feet): 3 Feet   Gait Pattern/deviations: Step-to pattern   Gait velocity interpretation: <1.31 ft/sec, indicative of household ambulator   General Gait Details: face to face assist to side-step toward HOB.   Stairs             Wheelchair Mobility    Modified Rankin (Stroke Patients Only)       Balance Overall balance assessment: Needs assistance   Sitting balance-Leahy Scale: Poor Sitting balance - Comments: reliant on UE's and guarding in front for safety   Standing balance support: Single extremity supported;Bilateral upper extremity supported;During functional activity Standing balance-Leahy Scale: Poor Standing balance comment: reliant on external support.  stood at EOB for 25-30 sec.  pt was not yet strong enough to stand in the RW without 2 person assist                            Cognition Arousal/Alertness: Awake/alert Behavior During Therapy: Restless Overall Cognitive Status: Impaired/Different from baseline                   Orientation Level: Disoriented to;Time;Situation Current Attention Level: Sustained Memory: Decreased short-term memory Following Commands: Follows one step commands consistently Safety/Judgement: Decreased awareness of deficits Awareness: Emergent Problem Solving: Slow processing;Difficulty sequencing;Requires verbal cues          Exercises      General Comments General comments (skin integrity, edema, etc.): sats dropped to 85% on 6L and upped to 8L to allow pt to maintain 90+% sats sitting/standing at EOB      Pertinent Vitals/Pain Pain Assessment: Faces Faces Pain Scale: Hurts  a little bit Facial Expression: Relaxed, neutral Body Movements: Absence of movements Muscle Tension: Relaxed Compliance with ventilator (intubated pts.): N/A Vocalization (extubated pts.): Sighing, moaning CPOT Total: 1 Pain Location: abdomen, general Pain Descriptors / Indicators:  Guarding Pain Intervention(s): Monitored during session    Home Living                          Prior Function            PT Goals (current goals can now be found in the care plan section) Acute Rehab PT Goals PT Goal Formulation: Patient unable to participate in goal setting Time For Goal Achievement: 08/12/21 Potential to Achieve Goals: Fair Progress towards PT goals: Progressing toward goals    Frequency    Min 2X/week      PT Plan Current plan remains appropriate    Co-evaluation              AM-PAC PT "6 Clicks" Mobility   Outcome Measure  Help needed turning from your back to your side while in a flat bed without using bedrails?: A Lot Help needed moving from lying on your back to sitting on the side of a flat bed without using bedrails?: A Lot Help needed moving to and from a bed to a chair (including a wheelchair)?: A Lot Help needed standing up from a chair using your arms (e.g., wheelchair or bedside chair)?: A Lot Help needed to walk in hospital room?: Total Help needed climbing 3-5 steps with a railing? : Total 6 Click Score: 10    End of Session Equipment Utilized During Treatment: Oxygen Activity Tolerance: Patient tolerated treatment well;Patient limited by fatigue Patient left: in bed;with call bell/phone within reach;with bed alarm set;with nursing/sitter in room Nurse Communication: Mobility status PT Visit Diagnosis: Other abnormalities of gait and mobility (R26.89);Muscle weakness (generalized) (M62.81)     Time: 6283-1517 PT Time Calculation (min) (ACUTE ONLY): 18 min  Charges:  $Therapeutic Activity: 8-22 mins                     08/01/2021  Ginger Carne., PT Acute Rehabilitation Services [phone]  (pager) [phone]  (office)   Tessie Fass Princessa Lesmeister 08/01/2021, 1:19 PM

## 2021-08-01 NOTE — Progress Notes (Signed)
PHARMACY - TOTAL PARENTERAL NUTRITION CONSULT NOTE   Indication: Prolonged ileus  Patient Measurements: Height: 5\' 7"  (170.2 cm) Weight:  (bed malfunction. only appropriate items on bed with pt and wt. still unrealistically out of range.) IBW/kg (Calculated) : 66.1 TPN AdjBW (KG): 73.7 Body mass index is 29.9 kg/m.  Assessment: 25 yom with perforated liver abscess status post partial omentectomy and drains in the right and left upper quadrants.  Speech evaluated for ability to swallow and deemed moderate aspiration risk with NPO status. KUB on 11/21 with possible ileus vs obstructive with worsening on 11/22 imaging. CCM wanted to hold off on TPN start 11/22 and push bowel regimen - BM documented but not significant, smear only per CCM. Pharmacy consulted to initiate TPN 11/23.  Glucose / Insulin: hx uncontrolled DM2. A1c 12.2% (Lantus 58 units QHS + Novolog 18 units qAC + metformin PTA but unclear compliance). CBGs increased 160-240s on Semglee (insulin glargine-yfgn) 7 units BID + 20 units moderate SSI required in last 24hrs  Electrolytes: K 3.7 (goal >/=4 with ileus), Cl 112, Mg 2 (goal >/=2 with ileus), Phos 2.9, others WNL Renal: SCr stable wnl. BUN WNL.  Hepatic: ALT/AST WNL. Tbili stable. Prealbumin <5, albumin <1.5  Intake / Output; MIVF: NGT unable to be placed so far. Drain output 975 ml/24hrs, LBM 11/22 (smear only) -plan for enema today, UOP 0.4 mL/kg/hr.  GI Imaging: 11/23 [place] and MR of abdomen - interval worsening of small bowel dilation suggesting ileus or partial obstruction GI Surgeries / Procedures: none since consult for TPN   Central access: PICC planned 11/23 TPN start date: 11/23  Nutritional Goals: Goal TPN rate is 85 mL/hr (provides 112 g of protein and 2100 kcals per day)   RD Assessment: Estimated Needs Total Energy Estimated Needs: 2200-2400 Total Protein Estimated Needs: 125-145 grams Total Fluid Estimated Needs: >2L  Current Nutrition:  Clear liquids -  attempting today  Plan:  -Continue TPN to 65 mL/hr at 1800. Titrate to goal (85 ml/hr) as appropriate. Control CBGs and lytes then will increase to goal.  -TPN will provide 94 g protein and 1500 kCal, meeting 78% of protein and 68% of kCal needs.  -Electrolytes in TPN: initiate standard lytes except Cl:Ac ratio - Na 9mEq/L, increase K 53mEq/L, Ca 5 mEq/L, increase Mg 7 mEq/L, and increase Phos 18 mmol/L. Cl:Ac 1:2 -Add standard MVI and trace elements to TPN.  -Add Pepcid 40mg  to TPN (d/c current IV order) -Continue current regimen of Semglee 7 units BID and Moderate q4h SSI for now and increase to 25 units regular insulin to TPN bag (conservative amount added to account for TPN dextrose content). F/u CBGs and adjust as needed. *Discussed with Dr. Lake Bells 08/01/21.  KCL 10 meq IV x 4 (goal >= 4 with ileus) -Monitor TPN labs Friday while titrating up and on Mon/Thurs -F/u resolution of ileus, ability to tolerate PO   Sloan Leiter, PharmD, BCPS, BCCCP Clinical Pharmacist Please refer to [facility] numbers 08/01/2021, 7:24 AM
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
## 2021-08-02 DIAGNOSIS — K750 Abscess of liver: Secondary | ICD-10-CM | POA: Diagnosis not present

## 2021-08-02 DIAGNOSIS — J432 Centrilobular emphysema: Secondary | ICD-10-CM | POA: Diagnosis not present

## 2021-08-02 DIAGNOSIS — Z5189 Encounter for other specified aftercare: Secondary | ICD-10-CM

## 2021-08-02 DIAGNOSIS — E440 Moderate protein-calorie malnutrition: Secondary | ICD-10-CM

## 2021-08-02 DIAGNOSIS — E1169 Type 2 diabetes mellitus with other specified complication: Secondary | ICD-10-CM | POA: Diagnosis not present

## 2021-08-02 LAB — CBC WITH DIFFERENTIAL/PLATELET
Abs Immature Granulocytes: 0.3 10*3/uL — ABNORMAL HIGH (ref 0.00–0.07)
Basophils Absolute: 0 10*3/uL (ref 0.0–0.1)
Basophils Relative: 0 %
Eosinophils Absolute: 0.1 10*3/uL (ref 0.0–0.5)
Eosinophils Relative: 1 %
HCT: 34.5 % — ABNORMAL LOW (ref 39.0–52.0)
Hemoglobin: 10.8 g/dL — ABNORMAL LOW (ref 13.0–17.0)
Immature Granulocytes: 2 %
Lymphocytes Relative: 13 %
Lymphs Abs: 1.9 10*3/uL (ref 0.7–4.0)
MCH: 30.3 pg (ref 26.0–34.0)
MCHC: 31.3 g/dL (ref 30.0–36.0)
MCV: 96.9 fL (ref 80.0–100.0)
Monocytes Absolute: 0.8 10*3/uL (ref 0.1–1.0)
Monocytes Relative: 6 %
Neutro Abs: 11.5 10*3/uL — ABNORMAL HIGH (ref 1.7–7.7)
Neutrophils Relative %: 78 %
Platelets: 237 10*3/uL (ref 150–400)
RBC: 3.56 MIL/uL — ABNORMAL LOW (ref 4.22–5.81)
RDW: 14.3 % (ref 11.5–15.5)
WBC: 14.7 10*3/uL — ABNORMAL HIGH (ref 4.0–10.5)
nRBC: 0 % (ref 0.0–0.2)

## 2021-08-02 LAB — URINALYSIS, ROUTINE W REFLEX MICROSCOPIC
Bilirubin Urine: NEGATIVE
Glucose, UA: 50 mg/dL — AB
Ketones, ur: NEGATIVE mg/dL
Nitrite: NEGATIVE
Protein, ur: NEGATIVE mg/dL
Specific Gravity, Urine: 1.024 (ref 1.005–1.030)
pH: 5 (ref 5.0–8.0)

## 2021-08-02 LAB — COMPREHENSIVE METABOLIC PANEL
ALT: 24 U/L (ref 0–44)
AST: 21 U/L (ref 15–41)
Albumin: <1.5 g/dL — ABNORMAL LOW (ref 3.5–5.0)
Alkaline Phosphatase: 133 U/L — ABNORMAL HIGH (ref 38–126)
Anion gap: 4 — ABNORMAL LOW (ref 5–15)
BUN: 20 mg/dL (ref 8–23)
CO2: 28 mmol/L (ref 22–32)
Calcium: 7.4 mg/dL — ABNORMAL LOW (ref 8.9–10.3)
Chloride: 108 mmol/L (ref 98–111)
Creatinine, Ser: 0.64 mg/dL (ref 0.61–1.24)
GFR, Estimated: >60 mL/min (ref >60)
Glucose, Bld: 143 mg/dL — ABNORMAL HIGH (ref 70–99)
Potassium: 4.1 mmol/L (ref 3.5–5.1)
Sodium: 140 mmol/L (ref 135–145)
Total Bilirubin: 0.9 mg/dL (ref 0.3–1.2)
Total Protein: 5.1 g/dL — ABNORMAL LOW (ref 6.5–8.1)

## 2021-08-02 LAB — HEPARIN LEVEL (UNFRACTIONATED)
Heparin Unfractionated: 0.28 IU/mL — ABNORMAL LOW (ref 0.30–0.70)
Heparin Unfractionated: <0.1 IU/mL — ABNORMAL LOW (ref 0.30–0.70)
Heparin Unfractionated: 0.3 IU/mL (ref 0.30–0.70)

## 2021-08-02 LAB — GLUCOSE, CAPILLARY
Glucose-Capillary: 147 mg/dL — ABNORMAL HIGH (ref 70–99)
Glucose-Capillary: 114 mg/dL — ABNORMAL HIGH (ref 70–99)
Glucose-Capillary: 160 mg/dL — ABNORMAL HIGH (ref 70–99)
Glucose-Capillary: 128 mg/dL — ABNORMAL HIGH (ref 70–99)
Glucose-Capillary: 153 mg/dL — ABNORMAL HIGH (ref 70–99)
Glucose-Capillary: 144 mg/dL — ABNORMAL HIGH (ref 70–99)

## 2021-08-02 LAB — PHOSPHORUS: Phosphorus: 2.9 mg/dL (ref 2.5–4.6)

## 2021-08-02 LAB — MAGNESIUM: Magnesium: 2 mg/dL (ref 1.7–2.4)

## 2021-08-02 MED ORDER — TAMSULOSIN HCL 0.4 MG PO CAPS
0.4000 mg | ORAL_CAPSULE | Freq: Every day | ORAL | Status: DC
  Administered 2021-08-02 – 2021-08-04 (×3): 0.4 mg via ORAL
  Filled 2021-08-02 (×5): qty 1

## 2021-08-02 MED ORDER — TRAVASOL 10 % IV SOLN
INTRAVENOUS | Status: AC
  Filled 2021-08-02: qty 1252.8
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
## 2021-08-02 NOTE — Progress Notes (Signed)
PHARMACY - TOTAL PARENTERAL NUTRITION CONSULT NOTE   Indication: Prolonged ileus  Patient Measurements: Height: 5\' 7"  (170.2 cm) Weight:  (bed malfunction. only appropriate items on bed with pt and wt. still unrealistically out of range.) IBW/kg (Calculated) : 66.1 TPN AdjBW (KG): 73.7 Body mass index is 29.9 kg/m.  Assessment: 69 yom with perforated liver abscess status post partial omentectomy and drains in the right and left upper quadrants.  Speech evaluated for ability to swallow and deemed moderate aspiration risk with NPO status. KUB on 11/21 with possible ileus vs obstructive with worsening on 11/22 imaging. CCM wanted to hold off on TPN start 11/22 and push bowel regimen - BM documented but not significant, smear only per CCM. Pharmacy consulted to initiate TPN 11/23.  Glucose / Insulin: hx uncontrolled DM2. A1c 12.2% (Lantus 58 units QHS + Novolog 18 units qAC + metformin PTA but unclear compliance). CBGs 114-179 since new TPN hung with 25 units insulin in bag, also on Semglee (insulin glargine-yfgn) 7 units BID + 8 units moderate SSI required since new TPN hung Electrolytes: K 4.1 (goal >/=4 with ileus), Mg 2 (goal >/=2 with ileus), others WNL Renal: SCr stable wnl. BUN WNL.  Hepatic: ALT/AST WNL. Tbili stable. Prealbumin <5, albumin <1.5  Intake / Output; MIVF: NGT unable to be placed so far. Drain output  2L/24hrs, LBM 11/25 (6 BMs after enema yesterday), UOP 0.5 mL/kg/hr.  GI Imaging: 11/23 [place] and MR of abdomen - interval worsening of small bowel dilation suggesting ileus or partial obstruction GI Surgeries / Procedures: none since consult for TPN   Central access: PICC planned 11/23 TPN start date: 11/23  Nutritional Goals: Goal TPN rate is 90 mL/hr (provides 125 g of protein and 2200 kcals per day)   RD Assessment: Estimated Needs Total Energy Estimated Needs: 2200-2400 Total Protein Estimated Needs: 125-145 grams Total Fluid Estimated Needs: >2L  Current  Nutrition:  Clear liquids - fair intake, 50% of one meal recorded  Plan:  -Increase TPN to 90 mL/hr at 1800.  -TPN will provide 125 g protein and 2200 kCal, meeting 100% of protein and kCal needs.  -Electrolytes in TPN: initiate standard lytes except Cl:Ac ratio - Na 50 mEq/L, K 50 mEq/L, Ca 5 mEq/L, Mg 5 mEq/L, and Phos 15 mmol/L. Cl:Ac 1:2 -Add standard MVI and trace elements to TPN.  -Add Pepcid 40mg  to TPN (d/c current IV order) -Continue current regimen of Semglee 7 units BID and Moderate q4h SSI for now and increase to 30 units regular insulin to TPN bag (conservative amount added to account for TPN dextrose content). F/u CBGs and adjust as needed. *Discussed with Dr. Lake Bells 08/01/21.  -Monitor TPN labs on Mon/Thurs -F/u resolution of ileus, ability to tolerate PO   Sloan Leiter, PharmD, BCPS, BCCCP Clinical Pharmacist Please refer to [facility] for [place] numbers 08/02/2021, 7:18 AM

## 2021-08-02 NOTE — Progress Notes (Signed)
ANTICOAGULATION CONSULT NOTE  Pharmacy Consult for Heparin Indication: Portal Vein Thrombosis   No Known Allergies  Patient Measurements: Height: 5\' 7"  (170.2 cm) Weight:  (bed malfunction. only appropriate items on bed with pt and wt. still unrealistically out of range.) IBW/kg (Calculated) : 66.1 Heparin Dosing Weight: 73 kg   Vital Signs: Temp: 99.8 F (37.7 C) (11/26 0335) Temp Source: Axillary (11/26 0335) BP: 136/54 (11/26 0400) Pulse Rate: 88 (11/26 0400)  Labs: Recent Labs    07/30/21 2012 07/31/21 0343 07/31/21 1034 08/01/21 0300 08/01/21 0646 08/01/21 1700 08/02/21 0347  HGB 12.3* 11.6*  --  10.9*  --   --  10.8*  HCT 38.0* 35.5*  --  34.0*  --   --  34.5*  PLT 252 216  --  239  --   --  237  APTT 29  --   --   --   --   --   --   LABPROT 17.6*  --   --   --   --   --   --   INR 1.4*  --   --   --   --   --   --   HEPARINUNFRC  --  <0.10*   < >  --  0.28* 0.18* 0.28*  CREATININE  --  0.59*  --  0.68  --   --   --    < > = values in this interval not displayed.    Estimated Creatinine Clearance: 87.7 mL/min (by C-G formula based on SCr of 0.68 mg/dL).   Assessment: 70 yo male presented on 07/25/2021 after being found down with AMS found to have perforated liver abscess s/p partial omentectomy and drain placement. MRI with suspected portal vein thrombosis. Results of MRI dicussed with CCM and surgery. Pharmacy consulted to dose heparin.   Heparin level remains subtherapeutic (0.28) on infusion at 2200 units/hr. No issues with line or bleeding reported per RN.  Goal of Therapy:  Heparin level 0.3-0.7 units/ml Monitor platelets by anticoagulation protocol: Yes   Plan:  Increase heparin infusion to 2350 units/hr  Check 8 hr heparin level   Sherlon Handing, PharmD, BCPS Please see amion for complete clinical pharmacist phone list 08/02/2021 4:32 AM

## 2021-08-02 NOTE — Progress Notes (Addendum)
PROGRESS NOTE  Matthew Dominguez UKG:[id number] DOB: 09/22/1950 DOA: 07/25/2021 PCP: Sandrea Hughs, NP   LOS: 7 days   Brief narrative:  Patient is a 70 years old male with past medical history of asthma, cataract, COPD, depression, diabetes mellitus type 2, GERD, cognitive impairment hypertension, BPH, hypothyroidism presented to the hospital on 07/25/21 with complaints of confusion.  In the ED, patient was noted  perforated viscus on the CT scan.  Patient did report 1 day history of abdominal pain after which it had gotten worse with nausea and was subsequently confused.  Patient underwent exploratory laparotomy and drainage of the pyogenic liver abscess with partial omentectomy on 07/25/2021.  Infectious disease was consulted and patient was admitted to ICU.  On 11/19 patient was extubated.  Patient remained confused and required mittens and required cleviprex for hypertension.  Patient had slow return of bowel function so TPN was initiated on 11/22.  He was initially on cefepime which was discontinued and was started on Rocephin and Flagyl.  On 07/30/21,  MRCP was attempted with numerous hepatic abscess and left portal vein thrombosis.  Patient was subsequently considered stable for transfer out of the ICU.    Assessment/Plan:  Principal Problem:   Pyogenic liver abscess Active Problems:   Diabetes mellitus type 2, controlled ([place])   Essential hypertension, benign   COPD (chronic obstructive pulmonary disease) (HCC)   GERD (gastroesophageal reflux disease)   Encounter for postanesthesia care   Malnutrition of moderate degree  Sepsis secondary to ruptured pyogenic liver abscesses S/p exploratory laparotomy and partial omentectomy 11/18 Continue IV antibiotic.  General surgery on board.  Follow surgical recommendations.  MRCP with portal vein thrombosis with multiple hepatic abscesses.  ID recommends 4 weeks of antibiotic.  Mild leukocytosis with WBC of 14.3.  Hepatitis from ruptured  pyogenic liver abscesses  Improved.  We will follow LFTs.  Check lipid   Portal vein thrombosis Heparin drip as per pharmacy.   Essential hypertension  Patient had limited oral intake with ileus.  Was initially on Cleviprex.  Continue bisoprolol, losartan, amlodipine.   Post operative ileus Improving.  Continue TPN.  Currently on clears.   Diabetes mellitus type 2. Continue Semglee and sliding scale insulin.   Acute metabolic encephalopathy with possible underlying dementia.   Continue Seroquel.  Frequent orientation.  Continue to hold sedatives.     COPD  Continue Pulmicort, DuoNeb, flutter valve.  DVT prophylaxis: SCDs Start: 07/26/21 0039   Code Status: Full code  Family Communication:  Spoke with the patient at bedside.  Status is: Inpatient  Remains inpatient appropriate because: Status post exploratory laparotomy, ileus, on TPN,  Consultants: General surgery Infectious disease  Procedures:  Exploratory laparotomy with drainage of pyogenic liver abscess x 2, Partial omentectomy for Ruptured pyogenic liver abscesses by Dr. Dema Severin - 07/25/2021 MRCP.  Anti-infectives:  Rocephin and metronidazole 11/22>  Anti-infectives (From admission, onward)    Start     Dose/Rate Route Frequency Ordered Stop   07/29/21 1800  metroNIDAZOLE (FLAGYL) IVPB 500 mg        500 mg 100 mL/hr over 60 Minutes Intravenous Every 12 hours 07/29/21 0930     07/29/21 1400  cefTRIAXone (ROCEPHIN) 2 g in sodium chloride 0.9 % 100 mL IVPB        2 g 200 mL/hr over 30 Minutes Intravenous Every 24 hours 07/29/21 0945     07/26/21 1400  ceFEPIme (MAXIPIME) 2 g in sodium chloride 0.9 % 100 mL IVPB  Status:  Discontinued        2 g 200 mL/hr over 30 Minutes Intravenous Every 8 hours 07/26/21 0721 07/29/21 0945   07/26/21 0800  ceFEPIme (MAXIPIME) 2 g in sodium chloride 0.9 % 100 mL IVPB  Status:  Discontinued        2 g 200 mL/hr over 30 Minutes Intravenous Every 12 hours 07/25/21 1853  07/26/21 0213   07/26/21 0600  metroNIDAZOLE (FLAGYL) IVPB 500 mg  Status:  Discontinued        500 mg 100 mL/hr over 60 Minutes Intravenous Every 8 hours 07/26/21 0119 07/29/21 0930   07/26/21 0600  ceFEPIme (MAXIPIME) 2 g in sodium chloride 0.9 % 100 mL IVPB  Status:  Discontinued        2 g 200 mL/hr over 30 Minutes Intravenous Every 12 hours 07/26/21 0225 07/26/21 0721   07/25/21 1900  ceFEPIme (MAXIPIME) 2 g in sodium chloride 0.9 % 100 mL IVPB        2 g 200 mL/hr over 30 Minutes Intravenous  Once 07/25/21 1845 07/25/21 2015   07/25/21 1900  metroNIDAZOLE (FLAGYL) IVPB 500 mg        500 mg 100 mL/hr over 60 Minutes Intravenous  Once 07/25/21 1845 07/25/21 2138      Subjective:  Today, patient was seen and examined at bedside.  Patient denies any nausea, vomiting, abdominal pain has had 4-5 episodes of bowel movements.  Does not feel hungry.  Complains of mild cough with phlegm  Objective: Vitals:   08/02/21 1140 08/02/21 1200  BP:  (!) 150/66  Pulse: 84 84  Resp: 17 (!) 21  Temp: 98.3 F (36.8 C)   SpO2: 91% (!) 87%    Intake/Output Summary (Last 24 hours) at 08/02/2021 1312 Last data filed at 08/02/2021 1200 Gross per 24 hour  Intake 3196.09 ml  Output 2790 ml  Net 406.09 ml   Filed Weights   07/30/21 0958 07/30/21 1900 07/31/21 0417  Weight: 73.7 kg 73.7 kg 86.6 kg   Body mass index is 29.9 kg/m.   Physical Exam: GENERAL: Patient is alert awake and oriented. Not in obvious distress.  Nasal cannula in place. HENT: No scleral pallor or icterus. Pupils equally reactive to light. Oral mucosa is moist NECK: is supple, no gross swelling noted. CHEST:  Diminished breath sounds bilaterally. CVS: S1 and S2 heard, no murmur. Regular rate and rhythm.  ABDOMEN: Soft, non-tender, bowel sounds are present.  Abdominal incision with dressing.  Right upper quadrant drain in place. EXTREMITIES: No edema.  Right PICC line in place.  Mittens in place. CNS: Cranial nerves are  intact. No focal motor deficits. SKIN: warm and dry without rashes.  Data Review: I have personally reviewed the following laboratory data and studies,  CBC: Recent Labs  Lab 07/30/21 0408 07/30/21 2012 07/31/21 0343 08/01/21 0300 08/02/21 0347  WBC 21.7* 18.4* 14.0* 13.2* 14.7*  NEUTROABS  --   --   --   --  11.5*  HGB 12.8* 12.3* 11.6* 10.9* 10.8*  HCT 39.4 38.0* 35.5* 34.0* 34.5*  MCV 93.4 93.6 93.9 96.3 96.9  PLT 259 252 216 239 315   Basic Metabolic Panel: Recent Labs  Lab 07/29/21 0455 07/30/21 0408 07/31/21 0343 08/01/21 0300 08/02/21 0347  NA 139 143 142 143 140  K 3.8 3.7 3.5 3.7 4.1  CL 112* 114* 111 112* 108  CO2 20* 23 28 28 28   GLUCOSE 162* 123* 201* 207* 143*  BUN 22 19 18  18  20  CREATININE 0.73 0.69 0.59* 0.68 0.64  CALCIUM 7.6* 7.8* 7.4* 7.4* 7.4*  MG 1.9 2.1 1.8 2.0 2.0  PHOS 3.0 3.5 3.0 2.9 2.9   Liver Function Tests: Recent Labs  Lab 07/28/21 0425 07/29/21 0455 07/30/21 0408 07/31/21 0343 08/02/21 0347  AST 45* 28 24 24 21   ALT 78* 56* 46* 33 24  ALKPHOS 108 110 121 124 133*  BILITOT 1.3* 1.4* 1.2 1.3* 0.9  PROT 5.1* 5.1* 5.5* 5.0* 5.1*  ALBUMIN <1.5* <1.5* <1.5* <1.5* <1.5*   Recent Labs  Lab 07/27/21 0420  LIPASE 36   No results for input(s): AMMONIA in the last 168 hours. Cardiac Enzymes: No results for input(s): CKTOTAL, CKMB, CKMBINDEX, TROPONINI in the last 168 hours. BNP (last 3 results) No results for input(s): BNP in the last 8760 hours.  ProBNP (last 3 results) No results for input(s): PROBNP in the last 8760 hours.  CBG: Recent Labs  Lab 08/01/21 1912 08/01/21 2304 08/02/21 0332 08/02/21 0710 08/02/21 1140  GLUCAP 160* 179* 147* 114* 160*   Recent Results (from the past 240 hour(s))  Resp Panel by RT-PCR (Flu A&B, Covid) Nasopharyngeal Swab     Status: None   Collection Time: 07/25/21  3:48 PM   Specimen: Nasopharyngeal Swab; Nasopharyngeal(NP) swabs in vial transport medium  Result Value Ref Range Status    SARS Coronavirus 2 by RT PCR NEGATIVE NEGATIVE Final    Comment: (NOTE) SARS-CoV-2 target nucleic acids are NOT DETECTED.  The SARS-CoV-2 RNA is generally detectable in upper respiratory specimens during the acute phase of infection. The lowest concentration of SARS-CoV-2 viral copies this assay can detect is 138 copies/mL. A negative result does not preclude SARS-Cov-2 infection and should not be used as the sole basis for treatment or other patient management decisions. A negative result may occur with  improper specimen collection/handling, submission of specimen other than nasopharyngeal swab, presence of viral mutation(s) within the areas targeted by this assay, and inadequate number of viral copies(<138 copies/mL). A negative result must be combined with clinical observations, patient history, and epidemiological information. The expected result is Negative.  Fact Sheet for Patients:  EntrepreneurPulse.com.au  Fact Sheet for Healthcare Providers:  IncredibleEmployment.be  This test is no t yet approved or cleared by the [place] FDA and  has been authorized for detection and/or diagnosis of SARS-CoV-2 by FDA under an Emergency Use Authorization (EUA). This EUA will remain  in effect (meaning this test can be used) for the duration of the COVID-19 declaration under Section 564(b)(1) of the Act, 21 U.S.C.section 360bbb-3(b)(1), unless the authorization is terminated  or revoked sooner.       Influenza A by PCR NEGATIVE NEGATIVE Final   Influenza B by PCR NEGATIVE NEGATIVE Final    Comment: (NOTE) The Xpert Xpress SARS-CoV-2/FLU/RSV plus assay is intended as an aid in the diagnosis of influenza from Nasopharyngeal swab specimens and should not be used as a sole basis for treatment. Nasal washings and aspirates are unacceptable for Xpert Xpress SARS-CoV-2/FLU/RSV testing.  Fact Sheet for  Patients: EntrepreneurPulse.com.au  Fact Sheet for Healthcare Providers: IncredibleEmployment.be  This test is not yet approved or cleared by the [place] FDA and has been authorized for detection and/or diagnosis of SARS-CoV-2 by FDA under an Emergency Use Authorization (EUA). This EUA will remain in effect (meaning this test can be used) for the duration of the COVID-19 declaration under Section 564(b)(1) of the Act, 21 U.S.C. section 360bbb-3(b)(1), unless the authorization is terminated or  revoked.  Performed at [facility], [place] [address] [place], [place] [address]   Culture, Respiratory w Gram Stain (tracheal aspirate)     Status: None   Collection Time: 07/26/21 12:41 AM   Specimen: Tracheal Aspirate; Respiratory  Result Value Ref Range Status   Specimen Description TRACHEAL ASPIRATE  Final   Special Requests Normal  Final   Gram Stain   Final    NO SQUAMOUS EPITHELIAL CELLS SEEN FEW WBC SEEN NO ORGANISMS SEEN    Culture   Final    RARE Normal respiratory flora-no Staph aureus or Pseudomonas seen Performed at [facility], [address] [place], [place] [address]    Report Status 07/28/2021 FINAL  Final  MRSA Next Gen by PCR, Nasal     Status: None   Collection Time: 07/26/21  1:13 AM   Specimen: Nasal Mucosa; Nasal Swab  Result Value Ref Range Status   MRSA by PCR Next Gen NOT DETECTED NOT DETECTED Final    Comment: (NOTE) The GeneXpert MRSA Assay (FDA approved for NASAL specimens only), is one component of a comprehensive MRSA colonization surveillance program. It is not intended to diagnose MRSA infection nor to guide or monitor treatment for MRSA infections. Test performance is not FDA approved in patients less than 48 years old. Performed at [facility], [place] [address] [place], [place] [address]   Culture, blood (routine x 2)     Status: None   Collection Time: 07/26/21  7:11 AM   Specimen:  BLOOD  Result Value Ref Range Status   Specimen Description BLOOD SITE NOT SPECIFIED  Final   Special Requests   Final    BOTTLES DRAWN AEROBIC ONLY Blood Culture results may not be optimal due to an inadequate volume of blood received in culture bottles   Culture   Final    NO GROWTH 5 DAYS Performed at [facility], [place] [address] [place], [place] [address]    Report Status 07/31/2021 FINAL  Final  Culture, blood (routine x 2)     Status: None   Collection Time: 07/26/21  7:11 AM   Specimen: BLOOD  Result Value Ref Range Status   Specimen Description BLOOD SITE NOT SPECIFIED  Final   Special Requests   Final    BOTTLES DRAWN AEROBIC ONLY Blood Culture results may not be optimal due to an inadequate volume of blood received in culture bottles   Culture   Final    NO GROWTH 5 DAYS Performed at [facility], [place] [address], [place],  [address]    Report Status 07/31/2021 FINAL  Final     Studies: No results found.   Flora Lipps, MD  [facility] 08/02/2021  If 7PM-7AM, please contact night-coverage

## 2021-08-02 NOTE — Progress Notes (Signed)
8 Days Post-Op  Subjective: CC: More alert today. Tolerating some clears. Finished 1/4 tray at bedside. Denies n/v. No emesis recorded. Multiple BM's (6) documented on I/O. Denies abdominal pain.   Objective: Vital signs in last 24 hours: Temp:  [97.7 F (36.5 C)-99.8 F (37.7 C)] 98.1 F (36.7 C) (11/26 0710) Pulse Rate:  [73-96] 96 (11/26 0600) Resp:  [23-30] 26 (11/26 0600) BP: (126-169)/(49-59) 135/57 (11/26 0600) SpO2:  [90 %-95 %] 94 % (11/26 0757) Last BM Date: 08/02/21  Intake/Output from previous day: 11/25 0701 - 11/26 0700 In: 3845.1 [P.O.:1240; I.V.:2179.5; IV Piggyback:425.6] Out: 3085 [Urine:1000; Drains:2085] Intake/Output this shift: Total I/O In: 360 [P.O.:360] Out: 80 [Drains:80]  PE: Gen:  Awake and alert in NAD  Card:  Reg Pulm:  Normal rate and effort Abd: Distended but soft, generalized tenderness greatest in the upper abdomen. More active bowel sounds. Midline wound clean and without evidence of dehisence. RUQ JP drain with SS fluid in bulb without gross bile. LUQ drain with SS fluid in bulb without gross bile  Psych: A&Ox2  Lab Results:  Recent Labs    08/01/21 0300 08/02/21 0347  WBC 13.2* 14.7*  HGB 10.9* 10.8*  HCT 34.0* 34.5*  PLT 239 237   BMET Recent Labs    08/01/21 0300 08/02/21 0347  NA 143 140  K 3.7 4.1  CL 112* 108  CO2 28 28  GLUCOSE 207* 143*  BUN 18 20  CREATININE 0.68 0.64  CALCIUM 7.4* 7.4*   PT/INR Recent Labs    07/30/21 2012  LABPROT 17.6*  INR 1.4*   CMP     Component Value Date/Time   NA 140 08/02/2021 0347   NA 142 01/15/2016 1031   K 4.1 08/02/2021 0347   CL 108 08/02/2021 0347   CO2 28 08/02/2021 0347   GLUCOSE 143 (H) 08/02/2021 0347   BUN 20 08/02/2021 0347   BUN 10 01/15/2016 1031   CREATININE 0.64 08/02/2021 0347   CREATININE 0.74 04/18/2021 1353   CALCIUM 7.4 (L) 08/02/2021 0347   PROT 5.1 (L) 08/02/2021 0347   PROT 6.2 01/15/2016 1031   ALBUMIN <1.5 (L) 08/02/2021 0347    ALBUMIN 4.1 01/15/2016 1031   AST 21 08/02/2021 0347   ALT 24 08/02/2021 0347   ALKPHOS 133 (H) 08/02/2021 0347   BILITOT 0.9 08/02/2021 0347   BILITOT 0.3 01/15/2016 1031   GFRNONAA >60 08/02/2021 0347   GFRNONAA 91 02/04/2021 0959   GFRAA 106 02/04/2021 0959   Lipase     Component Value Date/Time   LIPASE 36 07/27/2021 0420    Studies/Results: No results found.  Anti-infectives: Anti-infectives (From admission, onward)    Start     Dose/Rate Route Frequency Ordered Stop   07/29/21 1800  metroNIDAZOLE (FLAGYL) IVPB 500 mg        500 mg 100 mL/hr over 60 Minutes Intravenous Every 12 hours 07/29/21 0930     07/29/21 1400  cefTRIAXone (ROCEPHIN) 2 g in sodium chloride 0.9 % 100 mL IVPB        2 g 200 mL/hr over 30 Minutes Intravenous Every 24 hours 07/29/21 0945     07/26/21 1400  ceFEPIme (MAXIPIME) 2 g in sodium chloride 0.9 % 100 mL IVPB  Status:  Discontinued        2 g 200 mL/hr over 30 Minutes Intravenous Every 8 hours 07/26/21 0721 07/29/21 0945   07/26/21 0800  ceFEPIme (MAXIPIME) 2 g in sodium chloride 0.9 % 100 mL  IVPB  Status:  Discontinued        2 g 200 mL/hr over 30 Minutes Intravenous Every 12 hours 07/25/21 1853 07/26/21 0213   07/26/21 0600  metroNIDAZOLE (FLAGYL) IVPB 500 mg  Status:  Discontinued        500 mg 100 mL/hr over 60 Minutes Intravenous Every 8 hours 07/26/21 0119 07/29/21 0930   07/26/21 0600  ceFEPIme (MAXIPIME) 2 g in sodium chloride 0.9 % 100 mL IVPB  Status:  Discontinued        2 g 200 mL/hr over 30 Minutes Intravenous Every 12 hours 07/26/21 0225 07/26/21 0721   07/25/21 1900  ceFEPIme (MAXIPIME) 2 g in sodium chloride 0.9 % 100 mL IVPB        2 g 200 mL/hr over 30 Minutes Intravenous  Once 07/25/21 1845 07/25/21 2015   07/25/21 1900  metroNIDAZOLE (FLAGYL) IVPB 500 mg        500 mg 100 mL/hr over 60 Minutes Intravenous  Once 07/25/21 1845 07/25/21 2138        Assessment/Plan POD 8 s/p Exploratory laparotomy with drainage of  pyogenic liver abscess x 2, Partial omentectomy for Ruptured pyogenic liver abscesses by Dr. Dema Severin - 07/25/2021 - Cont IV abx. Appreciate ID's assistance. On Rocephin/Flagyl. MRCP w/ portal vein thrombosis extending into portal venous bifurcation with multiple hepatic abscesses. They suspect liver abscess are complication of pylephlebitis. They recommend GI consult and 4 week of abx. On heparin gtt.  - Continue drains and monitor output. High output from RUQ drain. Drain output with SS output without gross bile. Question if this is is from some reactive ascites - Afebrile. Monitor leukocytosis, 14.7 today - Monitor LFT's - Ileus improving. Recommend speech to see again before diet advancement. On CLD - Continue PICC/TPN till tolerating PO better. Albumin < 1.5, pre-albumin <5 - Cont WTD BID to midline - Therapies, mobilize - Pulm toilet   FEN - CLD. Speech consult VTE - SCDs, heparin gtt ID - Cefepime/Flagyl Foley - out 11/22   HTN  Dementia/delirium/agitation - on Seroquel COPD GERD DM2 Prior hx IVDU (1970's)   LOS: 7 days    Jillyn Ledger , [facility] 08/02/2021, 9:01 AM Please see Amion for pager number during day hours 7:00am-4:30pm

## 2021-08-02 NOTE — Progress Notes (Signed)
ANTICOAGULATION CONSULT NOTE  Pharmacy Consult for Heparin Indication: Portal Vein Thrombosis   No Known Allergies  Patient Measurements: Height: 5\' 7"  (170.2 cm) Weight:  (bed malfunction. only appropriate items on bed with pt and wt. still unrealistically out of range.) IBW/kg (Calculated) : 66.1 Heparin Dosing Weight: 73 kg   Vital Signs: Temp: 97.5 F (36.4 C) (11/26 2310) Temp Source: Oral (11/26 2310) BP: 135/49 (11/26 2300) Pulse Rate: 96 (11/26 2300)  Labs: Recent Labs    07/31/21 0343 07/31/21 1034 08/01/21 0300 08/01/21 0646 08/02/21 0347 08/02/21 1450 08/02/21 2246  HGB 11.6*  --  10.9*  --  10.8*  --   --   HCT 35.5*  --  34.0*  --  34.5*  --   --   PLT 216  --  239  --  237  --   --   HEPARINUNFRC <0.10*   < >  --    < > 0.28* <0.10* 0.30  CREATININE 0.59*  --  0.68  --  0.64  --   --    < > = values in this interval not displayed.     Estimated Creatinine Clearance: 87.7 mL/min (by C-G formula based on SCr of 0.64 mg/dL).   Assessment: 70 yo male presented on 07/25/2021 after being found down with AMS found to have perforated liver abscess s/p partial omentectomy and drain placement. MRI with suspected portal vein thrombosis. Results of MRI dicussed with CCM and surgery. Pharmacy consulted to dose heparin.   Heparin level at low end of therapeutic (0.3) on infusion at 2350 units/hr. No issues with infusion or bleeding per RN.   Goal of Therapy:  Heparin level 0.3-0.7 units/ml Monitor platelets by anticoagulation protocol: Yes   Plan:  Increase heparin infusion to 2450 units/hr  Check 8 hr heparin level   Sherlon Handing, PharmD, BCPS Please see amion for complete clinical pharmacist phone list 08/02/2021 11:23 PM

## 2021-08-03 DIAGNOSIS — A419 Sepsis, unspecified organism: Principal | ICD-10-CM

## 2021-08-03 DIAGNOSIS — J432 Centrilobular emphysema: Secondary | ICD-10-CM | POA: Diagnosis not present

## 2021-08-03 DIAGNOSIS — I81 Portal vein thrombosis: Secondary | ICD-10-CM | POA: Diagnosis present

## 2021-08-03 DIAGNOSIS — I160 Hypertensive urgency: Secondary | ICD-10-CM | POA: Diagnosis present

## 2021-08-03 DIAGNOSIS — G9341 Metabolic encephalopathy: Secondary | ICD-10-CM | POA: Diagnosis not present

## 2021-08-03 DIAGNOSIS — K9189 Other postprocedural complications and disorders of digestive system: Secondary | ICD-10-CM

## 2021-08-03 DIAGNOSIS — E1169 Type 2 diabetes mellitus with other specified complication: Secondary | ICD-10-CM | POA: Diagnosis not present

## 2021-08-03 DIAGNOSIS — K567 Ileus, unspecified: Secondary | ICD-10-CM

## 2021-08-03 DIAGNOSIS — R6520 Severe sepsis without septic shock: Secondary | ICD-10-CM | POA: Diagnosis present

## 2021-08-03 LAB — CBC
HCT: 31.3 % — ABNORMAL LOW (ref 39.0–52.0)
Hemoglobin: 9.6 g/dL — ABNORMAL LOW (ref 13.0–17.0)
MCH: 29.9 pg (ref 26.0–34.0)
MCHC: 30.7 g/dL (ref 30.0–36.0)
MCV: 97.5 fL (ref 80.0–100.0)
Platelets: 226 10*3/uL (ref 150–400)
RBC: 3.21 MIL/uL — ABNORMAL LOW (ref 4.22–5.81)
RDW: 14.4 % (ref 11.5–15.5)
WBC: 14.5 10*3/uL — ABNORMAL HIGH (ref 4.0–10.5)
nRBC: 0 % (ref 0.0–0.2)

## 2021-08-03 LAB — PHOSPHORUS: Phosphorus: 3.2 mg/dL (ref 2.5–4.6)

## 2021-08-03 LAB — GLUCOSE, CAPILLARY
Glucose-Capillary: 157 mg/dL — ABNORMAL HIGH (ref 70–99)
Glucose-Capillary: 174 mg/dL — ABNORMAL HIGH (ref 70–99)
Glucose-Capillary: 223 mg/dL — ABNORMAL HIGH (ref 70–99)
Glucose-Capillary: 231 mg/dL — ABNORMAL HIGH (ref 70–99)
Glucose-Capillary: 245 mg/dL — ABNORMAL HIGH (ref 70–99)
Glucose-Capillary: 248 mg/dL — ABNORMAL HIGH (ref 70–99)
Glucose-Capillary: 251 mg/dL — ABNORMAL HIGH (ref 70–99)

## 2021-08-03 LAB — COMPREHENSIVE METABOLIC PANEL
ALT: 19 U/L (ref 0–44)
AST: 21 U/L (ref 15–41)
Albumin: <1.5 g/dL — ABNORMAL LOW (ref 3.5–5.0)
Alkaline Phosphatase: 110 U/L (ref 38–126)
Anion gap: 5 (ref 5–15)
BUN: 24 mg/dL — ABNORMAL HIGH (ref 8–23)
CO2: 27 mmol/L (ref 22–32)
Calcium: 7.2 mg/dL — ABNORMAL LOW (ref 8.9–10.3)
Chloride: 104 mmol/L (ref 98–111)
Creatinine, Ser: 0.7 mg/dL (ref 0.61–1.24)
GFR, Estimated: >60 mL/min (ref >60)
Glucose, Bld: 174 mg/dL — ABNORMAL HIGH (ref 70–99)
Potassium: 4.1 mmol/L (ref 3.5–5.1)
Sodium: 136 mmol/L (ref 135–145)
Total Bilirubin: 0.6 mg/dL (ref 0.3–1.2)
Total Protein: 4.5 g/dL — ABNORMAL LOW (ref 6.5–8.1)

## 2021-08-03 LAB — MAGNESIUM: Magnesium: 1.8 mg/dL (ref 1.7–2.4)

## 2021-08-03 LAB — HEPARIN LEVEL (UNFRACTIONATED): Heparin Unfractionated: 0.36 IU/mL (ref 0.30–0.70)

## 2021-08-03 MED ORDER — TRAVASOL 10 % IV SOLN
INTRAVENOUS | Status: AC
  Filled 2021-08-03: qty 1252.8

## 2021-08-03 MED ORDER — INSULIN REGULAR HUMAN 100 UNIT/ML IJ SOLN
INTRAMUSCULAR | Status: DC
Start: 2021-08-03 — End: 2021-08-03

## 2021-08-03 NOTE — Assessment & Plan Note (Addendum)
-  Continue new Seroquel  Delirium precautions:   -Lights and TV off, minimize interruptions at night  -Blinds open and lights on during day  -Glasses/hearing aid with patient  -Frequent reorientation  -PT/OT when able  -Avoid sedation medications/Beers list medications

## 2021-08-03 NOTE — Progress Notes (Signed)
Progress Note    Matthew Dominguez   KNL:[id number]  DOB: 1951/04/07  DOA: 07/25/2021     8 Date of Service: 08/03/2021        Brief summary: Matthew Dominguez is a 70 y.o. M with DM, hypothyroidism, HTN COPD/asthma not on home O2, who presented with confusion and 1 day abdomina pain and vomiting.    In the ER, CT showed a perforated viscus and liver abscess.  He underwent emergent ex-lap, drainage of liver abscess, and omentectomy.      11/18 Admitted and taken to OR 11/19 ID consulted, extubated; BP hard to control, started Cleviprex 11/22 TPN initiated 11/23 MRCP showed multiple hepatic abscesses and PVT       Assessment and Plan * Severe sepsis ([place]) -Continue Rocephin and flagyl  Pyogenic liver abscess -Continue Rocephin - Consult ID, appreciate cares - Will consult GI  -Continue TPN  Portal vein thrombosis -Continue heparin gtt  Hypertensive urgency BP controlled -Continue amlodipine, bisopprolol, losaratn  Ileus, postoperative (HCC) -Continue TPN  - SLP consult - Consult Gen Surg, apprecaite cares  Essential hypertension, benign    Acute metabolic encephalopathy -Continue new Seroquel  Delirium precautions:   -Lights and TV off, minimize interruptions at night  -Blinds open and lights on during day  -Glasses/hearing aid with patient  -Frequent reorientation  -PT/OT when able  -Avoid sedation medications/Beers list medications    Mild cognitive impairment    Diabetes mellitus type 2, controlled (HCC) -Continue Lantus -Continue SS corrections  COPD (chronic obstructive pulmonary disease) (HCC) -Continue Budesonide  Malnutrition of moderate degree       Subjective:  No nursing concerns.  No fever.  No sputum.  General surgery note is swallowing is not very good and he has had a choking episode with trying to take oral intake.  Objective Vitals:   08/03/21 1400 08/03/21 1500 08/03/21 1528 08/03/21 1600  BP: (!) 140/57 (!) 155/66  (!)  137/53  Pulse: 79 94  86  Resp: (!) 23 (!) 28  (!) 26  Temp:   99.2 F (37.3 C)   TempSrc:   Oral   SpO2: 91% 96%  93%  Weight:      Height:       86.6 kg  Vital signs reviewed and noted to be tachypneic   Exam General appearance: Elderly adult male, lying in bed, appears debilitated, makes eye contact, interactive     HEENT: Nasal cannula in place, oropharynx dry, edentulous, lips dry, no nasal discharge, deformity, or epistaxis.  Eyes normal. Skin: No suspicious rashes or lesions Cardiac: RRR, no murmurs, no lower extremity edema Respiratory: Mild tachypnea, no grunting or accessory muscle use, no rales, coarse crackles in the upper airways are noted Abdomen: Abdomen tender diffusely, no voluntary guarding or rebound. MSK: Moderately reduced subcutaneous muscle mass and fat Neuro: Extraocular movements intact, moves upper extremities with generalized weakness but symmetric strength, speech fluent Psych: Attention not bad, affect blunted, makes eye contact, answers questions, not oriented to person or situation, thinks he is in [place]     Labs / Other Information My review of labs, imaging, notes and other tests is significant for Normal renal function and electrolytes.  Normal LFTs.  White blood cell count still 14, hemoglobin stable at 9.6     Disposition Plan: Status is: Inpatient  Remains inpatient appropriate because: The patient remains on TPN, will need to advance his diet, and make arrangements for long-term antibiotic treatment prior to discharge.  Spoke to  daughter by phone        Time spent: 35 minutes [facility] 08/03/2021, 5:48 PM

## 2021-08-03 NOTE — Assessment & Plan Note (Signed)
-  Continue Budesonide

## 2021-08-03 NOTE — Progress Notes (Signed)
9 Days Post-Op  Subjective: CC: Seen earlier this am. Patient awake and alert. Reports no abdominal pain, n/v. RN at bedside reports tolerating small sips of cld without significant coughing. 4 liquid bm's yesterday.   Objective: Vital signs in last 24 hours: Temp:  [97.5 F (36.4 C)-100.2 F (37.9 C)] 98.6 F (37 C) (11/27 0717) Pulse Rate:  [79-101] 95 (11/27 0820) Resp:  [0-33] 26 (11/27 0820) BP: (98-178)/(43-88) 143/63 (11/27 0800) SpO2:  [87 %-95 %] 92 % (11/27 0947) FiO2 (%):  [55 %] 55 % (11/27 0927) Last BM Date: 08/02/21  Intake/Output from previous day: 11/26 0701 - 11/27 0700 In: 3474.7 [P.O.:540; I.V.:2624.8; IV Piggyback:300] Out: 2712 [FVCBS:4967; Drains:1275] Intake/Output this shift: Total I/O In: 348.9 [P.O.:120; I.V.:228.9] Out: -   PE: Gen:  Awake and alert in NAD  Card:  Reg Pulm: On o2, distant breath sounds at the bases b/l.  Abd: Distended but soft, generalized tenderness greatest in the upper abdomen. +bowel sounds. Midline wound clean and without evidence of dehisence. RUQ drain with SS fluid in bag without gross bile. LUQ drain with SS fluid in bulb without gross bile  Psych: A&Ox2  Lab Results:  Recent Labs    08/02/21 0347 08/03/21 0304  WBC 14.7* 14.5*  HGB 10.8* 9.6*  HCT 34.5* 31.3*  PLT 237 226   BMET Recent Labs    08/02/21 0347 08/03/21 0304  NA 140 136  K 4.1 4.1  CL 108 104  CO2 28 27  GLUCOSE 143* 174*  BUN 20 24*  CREATININE 0.64 0.70  CALCIUM 7.4* 7.2*   PT/INR No results for input(s): LABPROT, INR in the last 72 hours. CMP     Component Value Date/Time   NA 136 08/03/2021 0304   NA 142 01/15/2016 1031   K 4.1 08/03/2021 0304   CL 104 08/03/2021 0304   CO2 27 08/03/2021 0304   GLUCOSE 174 (H) 08/03/2021 0304   BUN 24 (H) 08/03/2021 0304   BUN 10 01/15/2016 1031   CREATININE 0.70 08/03/2021 0304   CREATININE 0.74 04/18/2021 1353   CALCIUM 7.2 (L) 08/03/2021 0304   PROT 4.5 (L) 08/03/2021 0304    PROT 6.2 01/15/2016 1031   ALBUMIN <1.5 (L) 08/03/2021 0304   ALBUMIN 4.1 01/15/2016 1031   AST 21 08/03/2021 0304   ALT 19 08/03/2021 0304   ALKPHOS 110 08/03/2021 0304   BILITOT 0.6 08/03/2021 0304   BILITOT 0.3 01/15/2016 1031   GFRNONAA >60 08/03/2021 0304   GFRNONAA 91 02/04/2021 0959   GFRAA 106 02/04/2021 0959   Lipase     Component Value Date/Time   LIPASE 36 07/27/2021 0420    Studies/Results: No results found.  Anti-infectives: Anti-infectives (From admission, onward)    Start     Dose/Rate Route Frequency Ordered Stop   07/29/21 1800  metroNIDAZOLE (FLAGYL) IVPB 500 mg        500 mg 100 mL/hr over 60 Minutes Intravenous Every 12 hours 07/29/21 0930     07/29/21 1400  cefTRIAXone (ROCEPHIN) 2 g in sodium chloride 0.9 % 100 mL IVPB        2 g 200 mL/hr over 30 Minutes Intravenous Every 24 hours 07/29/21 0945     07/26/21 1400  ceFEPIme (MAXIPIME) 2 g in sodium chloride 0.9 % 100 mL IVPB  Status:  Discontinued        2 g 200 mL/hr over 30 Minutes Intravenous Every 8 hours 07/26/21 0721 07/29/21 0945   07/26/21 0800  ceFEPIme (MAXIPIME) 2 g in sodium chloride 0.9 % 100 mL IVPB  Status:  Discontinued        2 g 200 mL/hr over 30 Minutes Intravenous Every 12 hours 07/25/21 1853 07/26/21 0213   07/26/21 0600  metroNIDAZOLE (FLAGYL) IVPB 500 mg  Status:  Discontinued        500 mg 100 mL/hr over 60 Minutes Intravenous Every 8 hours 07/26/21 0119 07/29/21 0930   07/26/21 0600  ceFEPIme (MAXIPIME) 2 g in sodium chloride 0.9 % 100 mL IVPB  Status:  Discontinued        2 g 200 mL/hr over 30 Minutes Intravenous Every 12 hours 07/26/21 0225 07/26/21 0721   07/25/21 1900  ceFEPIme (MAXIPIME) 2 g in sodium chloride 0.9 % 100 mL IVPB        2 g 200 mL/hr over 30 Minutes Intravenous  Once 07/25/21 1845 07/25/21 2015   07/25/21 1900  metroNIDAZOLE (FLAGYL) IVPB 500 mg        500 mg 100 mL/hr over 60 Minutes Intravenous  Once 07/25/21 1845 07/25/21 2138         Assessment/Plan POD 9 s/p Exploratory laparotomy with drainage of pyogenic liver abscess x 2, Partial omentectomy for Ruptured pyogenic liver abscesses by Dr. Dema Severin - 07/25/2021 - Cont IV abx. Appreciate ID's assistance. On Rocephin/Flagyl. MRCP w/ portal vein thrombosis extending into portal venous bifurcation with multiple hepatic abscesses. They suspect liver abscess are complication of pylephlebitis. They recommend GI consult and 4 week of abx. On heparin gtt.  - Continue drains and monitor output. High output from RUQ drain (down some today 1.8L > 1.2L). Drain output with SS output without gross bile. Question if this is is from some reactive ascites - LFT's normalized - Patient with ROBF. Was on CLD w/o emesis. Seen by speech with some concern for aspiration (subtle coughing after consumption of thin liquids). Will make NPO until can get further testing by speech for clearance/diet recommendations. Will let [facility] know plans. - Continue PICC/TPN till tolerating PO better. Albumin < 1.5, pre-albumin <5 - Cont WTD BID to midline - Therapies, mobilize - Pulm toilet, on o2.    FEN - NPO until cleared by speech, IVF per TRH VTE - SCDs, heparin gtt ID - Rocephin/Flagyl. Tmax 100.2. Monitor leukocytosis, 14.7 today Foley - out 11/22. I/O overnight. Monitor for retention   HTN  Dementia/delirium/agitation - on Seroquel COPD GERD DM2 Prior hx IVDU (1970's)   LOS: 8 days    Jillyn Ledger , [facility] 08/03/2021, 10:36 AM Please see Amion for pager number during day hours 7:00am-4:30pm

## 2021-08-03 NOTE — Hospital Course (Addendum)
Matthew Dominguez is a 70 y.o. M with DM, hypothyroidism, HTN COPD/asthma not on home O2, who presented with confusion and 1 day abdomina pain and vomiting.    In the ER, CT showed a perforated viscus and liver abscess.  He underwent emergent ex-lap, drainage of liver abscess, and omentectomy.      11/18 Admitted and taken to OR 11/19 ID consulted, extubated; BP hard to control, started Cleviprex 11/22 TPN initiated 11/23 MRCP showed multiple hepatic abscesses and PVT 11/27 IR consulted for perc drain 11/29: Respiratory decompensation overnight, transferred to ICU

## 2021-08-03 NOTE — Progress Notes (Signed)
RT called to bedside due to pt desat. into the low 80s. RN placed pt on NRB. Pt having expiratory wheezes and rhonchi,  RT gave pt 2 albuterol nebs and cpt, pt tolerated well. RT placed pt on 12L HFNC pt o2 sat increased to 91%. RN at bedside, MD aware, RT will continue to monitor.

## 2021-08-03 NOTE — Progress Notes (Signed)
Pt need eval by speech therapy for swallow. Speech to f/u Monday for FEES.  Speech identified on 11/26 possible patient silent aspiration. Today Pt had  desaturation SPO2 episode to low 80s approx 0930 this am (see flow sheets) after liquid oral intake. Pt subsequently NPO. If patient fails FEES request order for [place].

## 2021-08-03 NOTE — Progress Notes (Addendum)
ANTICOAGULATION CONSULT NOTE  Pharmacy Consult for Heparin Indication: Portal Vein Thrombosis   No Known Allergies  Patient Measurements: Height: 5\' 7"  (170.2 cm) Weight:  (bed malfunction. only appropriate items on bed with pt and wt. still unrealistically out of range.) IBW/kg (Calculated) : 66.1 Heparin Dosing Weight: 73 kg   Vital Signs: Temp: 98.6 F (37 C) (11/27 0717) Temp Source: Oral (11/27 0717) BP: 132/57 (11/27 1200) Pulse Rate: 85 (11/27 1215)  Labs: Recent Labs    08/01/21 0300 08/01/21 0646 08/02/21 0347 08/02/21 1450 08/02/21 2246 08/03/21 0304 08/03/21 1025  HGB 10.9*  --  10.8*  --   --  9.6*  --   HCT 34.0*  --  34.5*  --   --  31.3*  --   PLT 239  --  237  --   --  226  --   HEPARINUNFRC  --    < > 0.28* <0.10* 0.30  --  0.36  CREATININE 0.68  --  0.64  --   --  0.70  --    < > = values in this interval not displayed.    Estimated Creatinine Clearance: 87.7 mL/min (by C-G formula based on SCr of 0.7 mg/dL).   Assessment: 70 yo male presented on 07/25/2021 after being found down with AMS found to have perforated liver abscess s/p partial omentectomy and drain placement. MRI with suspected portal vein thrombosis. Results of MRI dicussed with CCM and surgery. Pharmacy consulted to dose heparin.   Heparin level at low end of therapeutic x2 (0.36) on infusion at 2350 units/hr. No issues with infusion or bleeding per RN.   Goal of Therapy:  Heparin level 0.3-0.7 units/ml Monitor platelets by anticoagulation protocol: Yes   Plan:  Continue heparin infusion to 2450 units/hr  Daily heparin level   Sloan Leiter, PharmD, BCPS, BCCCP Clinical Pharmacist Please refer to [facility] for [place] numbers 08/03/2021 12:17 PM

## 2021-08-03 NOTE — Assessment & Plan Note (Addendum)
-  Continue Rocephin and flagyl

## 2021-08-03 NOTE — Progress Notes (Signed)
Notified Patients daughter of Pt transfer to to room 5N17.  Daughter gave Pts wifes cell number for him to call while she is at [facility] for physical rehab. [phone]

## 2021-08-03 NOTE — Assessment & Plan Note (Signed)
-  Continue TPN  - SLP consult - Consult Gen Surg, apprecaite cares

## 2021-08-03 NOTE — Progress Notes (Addendum)
PHARMACY - TOTAL PARENTERAL NUTRITION CONSULT NOTE   Indication: Prolonged ileus  Patient Measurements: Height: 5\' 7"  (170.2 cm) Weight:  (bed malfunction. only appropriate items on bed with pt and wt. still unrealistically out of range.) IBW/kg (Calculated) : 66.1 TPN AdjBW (KG): 73.7 Body mass index is 29.9 kg/m.  Assessment: 4 yom with perforated liver abscess status post partial omentectomy and drains in the right and left upper quadrants.  Speech evaluated for ability to swallow and deemed moderate aspiration risk with NPO status. KUB on 11/21 with possible ileus vs obstructive with worsening on 11/22 imaging. CCM wanted to hold off on TPN start 11/22 and push bowel regimen - BM documented but not significant, smear only per CCM. Pharmacy consulted to initiate TPN 11/23.  Glucose / Insulin: hx uncontrolled DM2. A1c 12.2% (Lantus 58 units QHS + Novolog 18 units qAC + metformin PTA but unclear compliance). CBGs 144-245 since new TPN hung with 30 units insulin in bag, also on Semglee (insulin glargine-yfgn) 7 units BID + 12 units moderate SSI required since new TPN hung Electrolytes: Last labs 11/26- K 4.1 (goal >/=4 with ileus), Mg 2 (goal >/=2 with ileus), others WNL Renal: SCr stable wnl. BUN WNL.  Hepatic: ALT/AST WNL. Tbili stable. Prealbumin <5, albumin <1.5  Intake / Output; MIVF: NGT unable to be placed so far. Drain output  2L/24hrs, LBM 11/25 (6 BMs after enema yesterday), UOP 0.5 mL/kg/hr.  GI Imaging: 11/23 [place] and MR of abdomen - interval worsening of small bowel dilation suggesting ileus or partial obstruction GI Surgeries / Procedures: none since consult for TPN   Central access: PICC planned 11/23 TPN start date: 11/23  Nutritional Goals: Goal TPN rate is 90 mL/hr (provides 125 g of protein and 2200 kcals per day)   RD Assessment: Estimated Needs Total Energy Estimated Needs: 2200-2400 Total Protein Estimated Needs: 125-145 grams Total Fluid Estimated Needs:  >2L  Current Nutrition:  Clear liquids - tolerating small sips, not yet advancing  Plan:  -Continue TPN at 90 mL/hr at 1800.  -TPN will provide 125 g protein and 2200 kCal, meeting 100% of protein and kCal needs.  -Electrolytes in TPN: continue standard lytes except Cl:Ac ratio - Na 50 mEq/L, K 50 mEq/L, Ca 5 mEq/L, Mg 5 mEq/L, and Phos 15 mmol/L. Cl:Ac 1:2 -Add standard MVI and trace elements to TPN.  -Add Pepcid 40mg  to TPN (d/c current IV order) -Continue current regimen of Semglee 7 units BID and Moderate q4h SSI for now and increase to 35 units regular insulin to TPN bag (added to account for TPN dextrose content). F/u CBGs and adjust as needed.  -Monitor TPN labs on Mon/Thurs -F/u resolution of ileus, ability to tolerate PO   Matthew Dominguez, PharmD, BCPS, BCCCP Clinical Pharmacist Please refer to [facility] for [place] numbers 08/03/2021, 10:31 AM

## 2021-08-03 NOTE — Assessment & Plan Note (Signed)
BP controlled -Continue amlodipine, bisopprolol, losaratn

## 2021-08-03 NOTE — Assessment & Plan Note (Signed)
-  Continue Lantus -Continue SS corrections

## 2021-08-03 NOTE — Assessment & Plan Note (Signed)
-  Continue Rocephin - Consult ID, appreciate cares - Will consult GI  -Continue TPN

## 2021-08-03 NOTE — Assessment & Plan Note (Signed)
-  Continue heparin gtt

## 2021-08-04 ENCOUNTER — Encounter (HOSPITAL_COMMUNITY): Payer: Self-pay | Admitting: Pulmonary Disease

## 2021-08-04 ENCOUNTER — Inpatient Hospital Stay (HOSPITAL_COMMUNITY): Payer: Medicare HMO

## 2021-08-04 DIAGNOSIS — K750 Abscess of liver: Secondary | ICD-10-CM | POA: Diagnosis not present

## 2021-08-04 DIAGNOSIS — J9601 Acute respiratory failure with hypoxia: Secondary | ICD-10-CM | POA: Diagnosis not present

## 2021-08-04 DIAGNOSIS — R6520 Severe sepsis without septic shock: Secondary | ICD-10-CM | POA: Diagnosis not present

## 2021-08-04 DIAGNOSIS — J9602 Acute respiratory failure with hypercapnia: Secondary | ICD-10-CM

## 2021-08-04 DIAGNOSIS — I81 Portal vein thrombosis: Secondary | ICD-10-CM

## 2021-08-04 DIAGNOSIS — J432 Centrilobular emphysema: Secondary | ICD-10-CM | POA: Diagnosis not present

## 2021-08-04 DIAGNOSIS — G9341 Metabolic encephalopathy: Secondary | ICD-10-CM | POA: Diagnosis not present

## 2021-08-04 DIAGNOSIS — E1169 Type 2 diabetes mellitus with other specified complication: Secondary | ICD-10-CM | POA: Diagnosis not present

## 2021-08-04 DIAGNOSIS — R509 Fever, unspecified: Secondary | ICD-10-CM | POA: Diagnosis not present

## 2021-08-04 DIAGNOSIS — R338 Other retention of urine: Secondary | ICD-10-CM | POA: Diagnosis not present

## 2021-08-04 DIAGNOSIS — A419 Sepsis, unspecified organism: Secondary | ICD-10-CM | POA: Diagnosis not present

## 2021-08-04 LAB — BLOOD GAS, ARTERIAL
Acid-Base Excess: 0.2 mmol/L (ref 0.0–2.0)
Bicarbonate: 26.1 mmol/L (ref 20.0–28.0)
Drawn by: 39899
FIO2: 80
O2 Saturation: 80 %
Patient temperature: 36.9
pCO2 arterial: 56.6 mmHg — ABNORMAL HIGH (ref 32.0–48.0)
pH, Arterial: 7.285 — ABNORMAL LOW (ref 7.350–7.450)
pO2, Arterial: 51.5 mmHg — ABNORMAL LOW (ref 83.0–108.0)

## 2021-08-04 LAB — CBC
HCT: 33.1 % — ABNORMAL LOW (ref 39.0–52.0)
Hemoglobin: 10.4 g/dL — ABNORMAL LOW (ref 13.0–17.0)
MCH: 31.1 pg (ref 26.0–34.0)
MCHC: 31.4 g/dL (ref 30.0–36.0)
MCV: 99.1 fL (ref 80.0–100.0)
Platelets: 251 10*3/uL (ref 150–400)
RBC: 3.34 MIL/uL — ABNORMAL LOW (ref 4.22–5.81)
RDW: 14.5 % (ref 11.5–15.5)
WBC: 14.2 10*3/uL — ABNORMAL HIGH (ref 4.0–10.5)
nRBC: 0 % (ref 0.0–0.2)

## 2021-08-04 LAB — URINALYSIS, ROUTINE W REFLEX MICROSCOPIC
Bacteria, UA: NONE SEEN
Bilirubin Urine: NEGATIVE
Glucose, UA: 50 mg/dL — AB
Ketones, ur: NEGATIVE mg/dL
Leukocytes,Ua: NEGATIVE
Nitrite: NEGATIVE
Protein, ur: NEGATIVE mg/dL
Specific Gravity, Urine: 1.024 (ref 1.005–1.030)
pH: 5 (ref 5.0–8.0)

## 2021-08-04 LAB — COMPREHENSIVE METABOLIC PANEL
ALT: 18 U/L (ref 0–44)
AST: 25 U/L (ref 15–41)
Albumin: <1.5 g/dL — ABNORMAL LOW (ref 3.5–5.0)
Alkaline Phosphatase: 114 U/L (ref 38–126)
Anion gap: 8 (ref 5–15)
BUN: 28 mg/dL — ABNORMAL HIGH (ref 8–23)
CO2: 25 mmol/L (ref 22–32)
Calcium: 7.6 mg/dL — ABNORMAL LOW (ref 8.9–10.3)
Chloride: 105 mmol/L (ref 98–111)
Creatinine, Ser: 0.72 mg/dL (ref 0.61–1.24)
GFR, Estimated: >60 mL/min (ref >60)
Glucose, Bld: 241 mg/dL — ABNORMAL HIGH (ref 70–99)
Potassium: 4.8 mmol/L (ref 3.5–5.1)
Sodium: 138 mmol/L (ref 135–145)
Total Bilirubin: 0.5 mg/dL (ref 0.3–1.2)
Total Protein: 5.1 g/dL — ABNORMAL LOW (ref 6.5–8.1)

## 2021-08-04 LAB — GLUCOSE, CAPILLARY
Glucose-Capillary: 213 mg/dL — ABNORMAL HIGH (ref 70–99)
Glucose-Capillary: 190 mg/dL — ABNORMAL HIGH (ref 70–99)
Glucose-Capillary: 191 mg/dL — ABNORMAL HIGH (ref 70–99)
Glucose-Capillary: 154 mg/dL — ABNORMAL HIGH (ref 70–99)
Glucose-Capillary: 151 mg/dL — ABNORMAL HIGH (ref 70–99)

## 2021-08-04 LAB — PHOSPHORUS: Phosphorus: 3.6 mg/dL (ref 2.5–4.6)

## 2021-08-04 LAB — HEPARIN LEVEL (UNFRACTIONATED): Heparin Unfractionated: 0.42 IU/mL (ref 0.30–0.70)

## 2021-08-04 LAB — TRIGLYCERIDES: Triglycerides: 75 mg/dL (ref <150)

## 2021-08-04 LAB — MAGNESIUM: Magnesium: 1.8 mg/dL (ref 1.7–2.4)

## 2021-08-04 MED ORDER — FUROSEMIDE 10 MG/ML IJ SOLN
40.0000 mg | Freq: Once | INTRAMUSCULAR | Status: AC
  Administered 2021-08-05: 40 mg via INTRAVENOUS
  Filled 2021-08-04: qty 4

## 2021-08-04 MED ORDER — IPRATROPIUM-ALBUTEROL 0.5-2.5 (3) MG/3ML IN SOLN: 3.0000 mL | Freq: Once | RESPIRATORY_TRACT | Status: DC

## 2021-08-04 MED ORDER — TRAVASOL 10 % IV SOLN
INTRAVENOUS | Status: AC
  Filled 2021-08-04: qty 1252.8

## 2021-08-04 MED ORDER — COLLAGENASE 250 UNIT/GM EX OINT
TOPICAL_OINTMENT | Freq: Every day | CUTANEOUS | Status: DC
  Filled 2021-08-04 (×2): qty 30

## 2021-08-04 MED ORDER — FUROSEMIDE 10 MG/ML IJ SOLN
40.0000 mg | Freq: Once | INTRAMUSCULAR | Status: AC
  Administered 2021-08-04: 19:00:00 40 mg via INTRAVENOUS
  Filled 2021-08-04: qty 4

## 2021-08-04 MED ORDER — INSULIN GLARGINE-YFGN 100 UNIT/ML ~~LOC~~ SOLN
10.0000 [IU] | Freq: Two times a day (BID) | SUBCUTANEOUS | Status: AC
  Administered 2021-08-04 – 2021-08-08 (×9): 10 [IU] via SUBCUTANEOUS
  Filled 2021-08-04 (×12): qty 0.1

## 2021-08-04 NOTE — NC FL2 (Signed)
[place] LEVEL OF CARE SCREENING TOOL     IDENTIFICATION  Patient Name: Matthew Dominguez Birthdate: 09-Jul-1951 Sex: male Admission Date (Current Location): 07/25/2021  [place] and [place] Number:  Herbalist and Address:  [facility] [place], [place] [address], [place], [place] [address]      Provider Number: [id number]  Attending Physician Name and Address:  [name], *  Relative Name and Phone Number:  Mccranie,[name] Spouse [phone]  [phone]    Current Level of Care: Hospital Recommended Level of Care: [place] Prior Approval Number:    Date Approved/Denied:   PASRR Number: [id number]  Discharge Plan: SNF    Current Diagnoses: Patient Active Problem List   Diagnosis Date Noted   Severe sepsis ([place]) 08/03/2021   Portal vein thrombosis 08/03/2021   Hypertensive urgency 08/03/2021   Ileus, postoperative ([place]) 74/25/9563   Acute metabolic encephalopathy 87/56/4332   Malnutrition of moderate degree 07/30/2021   Pyogenic liver abscess 07/26/2021   GERD (gastroesophageal reflux disease)    Chronic right-sided low back pain with right-sided sciatica 08/08/2018   Drug-induced constipation 08/08/2018   Uncontrolled type 2 diabetes mellitus with hyperglycemia ([place]) 11/01/2017   Family history of colonic polyps 08/18/2017   Colon polyps    Family history of breast cancer    Multiple rib fractures 12/20/2015   Bifascicular bundle branch block 12/09/2015   Pleural effusion, right 12/09/2015   Mild cognitive impairment 12/09/2015   Abnormal CXR 10/07/2014   Right rotator cuff tendonitis 01/11/2014   Insomnia 06/15/2013   Diabetes mellitus type 2, controlled ([place]) 03/01/2013   Essential hypertension, benign 03/01/2013   Hyperlipidemia 03/01/2013   COPD (chronic obstructive pulmonary disease) ([place]) 03/01/2013   BPH (benign prostatic hyperplasia) 03/01/2013   Pain in joint, shoulder region 03/01/2013     Orientation RESPIRATION BLADDER Height & Weight     Self  O2 Incontinent Weight: 214 lb 15.2 oz (97.5 kg) Height:  5\' 7"  (170.2 cm)  BEHAVIORAL SYMPTOMS/MOOD NEUROLOGICAL BOWEL NUTRITION STATUS      Incontinent Diet (see discharge summary)  AMBULATORY STATUS COMMUNICATION OF NEEDS Skin   Extensive Assist Verbally Skin abrasions                       Personal Care Assistance Level of Assistance  Bathing, Feeding, Dressing, Total care Bathing Assistance: Maximum assistance Feeding assistance: Maximum assistance Dressing Assistance: Maximum assistance Total Care Assistance: Maximum assistance   Functional Limitations Info  Sight, Hearing, Speech Sight Info: Adequate Hearing Info: Adequate Speech Info: Adequate    SPECIAL CARE FACTORS FREQUENCY  PT (By licensed PT), OT (By licensed OT)     PT Frequency: 5x week OT Frequency: 5x week            Contractures Contractures Info: Not present    Additional Factors Info  Code Status, Allergies, Insulin Sliding Scale Code Status Info: full Allergies Info: NKA   Insulin Sliding Scale Info: Novolog, 0-15 units q 4 hours, see discharge summary       Current Medications (08/04/2021):  This is the current hospital active medication list Current Facility-Administered Medications  Medication Dose Route Frequency Provider Last Rate Last Admin   0.9 %  sodium chloride infusion   Intravenous PRN Juanito Doom, MD 10 mL/hr at 08/03/21 1600 Infusion Verify at 08/03/21 1600   acetaminophen (TYLENOL) tablet 650 mg  650 mg Oral Q4H PRN Juanito Doom, MD   650  mg at 08/03/21 0818   albuterol (PROVENTIL) (2.5 MG/3ML) 0.083% nebulizer solution 2.5 mg  2.5 mg Nebulization Q2H PRN Simonne Maffucci B, MD   2.5 mg at 08/04/21 0301   amLODipine (NORVASC) tablet 10 mg  10 mg Oral Daily Simonne Maffucci B, MD   10 mg at 08/03/21 0817   bisoprolol (ZEBETA) tablet 10 mg  10 mg Oral Daily Simonne Maffucci B, MD   10 mg at 08/03/21  1053   budesonide (PULMICORT) nebulizer solution 0.5 mg  0.5 mg Nebulization BID Simonne Maffucci B, MD   0.5 mg at 08/04/21 0017   cefTRIAXone (ROCEPHIN) 2 g in sodium chloride 0.9 % 100 mL IVPB  2 g Intravenous Q24H Juanito Doom, MD   Stopped at 08/03/21 1334   Chlorhexidine Gluconate Cloth 2 % PADS 6 each  6 each Topical Daily Simonne Maffucci B, MD   6 each at 08/03/21 0900   docusate (COLACE) 50 MG/5ML liquid 100 mg  100 mg Per Tube BID PRN Simonne Maffucci B, MD       docusate sodium (COLACE) capsule 100 mg  100 mg Oral BID Simonne Maffucci B, MD   100 mg at 08/03/21 0817   fentaNYL (SUBLIMAZE) injection 25-50 mcg  25-50 mcg Intravenous Q2H PRN Simonne Maffucci B, MD   50 mcg at 07/30/21 0115   guaiFENesin (ROBITUSSIN) 100 MG/5ML liquid 15 mL  15 mL Oral Q4H Simonne Maffucci B, MD   15 mL at 08/03/21 0815   heparin ADULT infusion 100 units/mL (25000 units/218mL)  2,450 Units/hr Intravenous Continuous Simonne Maffucci B, MD 24.5 mL/hr at 08/04/21 0100 2,450 Units/hr at 08/04/21 0100   hydrALAZINE (APRESOLINE) injection 10 mg  10 mg Intravenous Q6H PRN Simonne Maffucci B, MD   10 mg at 07/29/21 1722   [START ON 08/06/2021] influenza vaccine adjuvanted (FLUAD) injection 0.5 mL  0.5 mL Intramuscular Prior to discharge Simonne Maffucci B, MD       insulin aspart (novoLOG) injection 0-15 Units  0-15 Units Subcutaneous Q4H Simonne Maffucci B, MD   3 Units at 08/04/21 0819   insulin glargine-yfgn (SEMGLEE) injection 10 Units  10 Units Subcutaneous BID Danford, Suann Larry, MD       ipratropium-albuterol (DUONEB) 0.5-2.5 (3) MG/3ML nebulizer solution 3 mL  3 mL Nebulization BID Simonne Maffucci B, MD   3 mL at 08/04/21 0821   labetalol (NORMODYNE) injection 5 mg  5 mg Intravenous Q6H PRN Simonne Maffucci B, MD   5 mg at 08/01/21 1118   losartan (COZAAR) tablet 100 mg  100 mg Oral Daily Simonne Maffucci B, MD   100 mg at 08/03/21 0816   MEDLINE mouth rinse  15 mL Mouth Rinse BID Simonne Maffucci  B, MD   15 mL at 08/03/21 2249   metroNIDAZOLE (FLAGYL) IVPB 500 mg  500 mg Intravenous Q12H Simonne Maffucci B, MD 100 mL/hr at 08/04/21 0524 500 mg at 08/04/21 0524   ondansetron (ZOFRAN) injection 4 mg  4 mg Intravenous Q6H PRN Simonne Maffucci B, MD       polyethylene glycol (MIRALAX / GLYCOLAX) packet 17 g  17 g Per Tube Daily PRN Simonne Maffucci B, MD   17 g at 08/01/21 1037   QUEtiapine (SEROQUEL) tablet 25 mg  25 mg Oral QHS Simonne Maffucci B, MD   25 mg at 08/02/21 2045   sodium chloride flush (NS) 0.9 % injection 10-40 mL  10-40 mL Intracatheter Q12H Juanito Doom, MD   10 mL at 07/30/21  2112   sodium chloride flush (NS) 0.9 % injection 10-40 mL  10-40 mL Intracatheter Q12H Simonne Maffucci B, MD   10 mL at 08/02/21 2145   tamsulosin (FLOMAX) capsule 0.4 mg  0.4 mg Oral Daily Kristopher Oppenheim, DO   0.4 mg at 08/03/21 0818   TPN ADULT (ION)   Intravenous Continuous TPN Priscella Mann, RPH 90 mL/hr at 08/03/21 1817 New Bag at 08/03/21 1817   TPN ADULT (ION)   Intravenous Continuous TPN Mignon Pine, [facility]         Discharge Medications: Please see discharge summary for a list of discharge medications.  Relevant Imaging Results:  Relevant Lab Results:   Additional Information SSN: [id number]. Pt is vaccinated for covid but not boosted.  Joanne Chars, LCSW

## 2021-08-04 NOTE — Assessment & Plan Note (Signed)
-  Continue Rocephin and flagyl - Consult ID, GI, IR, Gen Surg appreciate cares

## 2021-08-04 NOTE — Progress Notes (Addendum)
Contacted this afternoon for concern of increased work of breathing.  Review of flowsheets shows that in fact, patient's O2 needs have been silently escalating this week.  Today, low grade fever and more WOB.  Ordered CXR, Lasix.  Later, nursing called me to bedside for notably worse O2 sats, increased WOB.  ABG ordered.  BP (!) 173/59 (BP Location: Left Arm)   Pulse 90   Temp 98.4 F (36.9 C) (Oral)   Resp (!) 25   Ht 5\' 7"  (1.702 m)   Wt 97.5 kg   SpO2 93%   BMI 33.67 kg/m   Patient appears stably confused, somnolent.  Lungs diminished, no wheezing.  He can't cooperate with exam due to delirium, so can't hear posterior, but sides/anterior just diminished.  Increased WOB noted.  Abdomen also very distended.    ABG shows pH 7.2, pCO2 >50, pO2 50.  CXR shows bilateral opacities, fluid in fissure.  US abdomen shows mild ascites.  D/w RT and CCM.  Will place on BiPAP now.  Will repeat ABG overnight and repeat Lasix after midnight.  Will try to obtain sputum.  Needs foley for close monitoring of I/Os.  70 min spent at bedside and on phone reviewing labs, coordinating care.

## 2021-08-04 NOTE — Progress Notes (Signed)
PT placed on bipap per MD request.

## 2021-08-04 NOTE — Progress Notes (Signed)
Bladder scan yielded 685 ml. Obtained 800 ml of tea-colored urine per In and Out cath. Pt tolerated with no problem. Urine specimen sent for UA and culture.  VWilliams, Therapist, sports.

## 2021-08-04 NOTE — Consult Note (Signed)
WOC Nurse Consult Note: Patient receiving care in [facility] 5N30. Reason for Consult: sacral stage 2 Wound type: UNSTAGEABLE to coccyx, bilateral buttocks Pressure Injury POA: No Measurement: 7 cm x 9 cm x unknown depth Wound bed: pink perimeter, yellow and brown non-viable to 99% of the wound area Drainage (amount, consistency, odor) none Periwound: intact Dressing procedure/placement/frequency: Apply Santyl to the coccyx wound in a nickel thick layer. Cover with a saline moistened gauze, foam dressing.  Change daily.  The primary RN had already ordered a bed with low air loss mattress. Thank you for the consult.  Discussed plan of care with the bedside nurse.  Val Riles, RN, MSN, CWOCN, CNS-BC, pager [phone]

## 2021-08-04 NOTE — Assessment & Plan Note (Signed)
Glucose elevated -Continue Lantus, increse dose, discussed with TPN pharmacist -Continue SS corrections

## 2021-08-04 NOTE — Progress Notes (Addendum)
NAME:  Matthew Dominguez, MRN:  [id number], DOB:  1951-03-22, LOS: 51 ADMISSION DATE:  07/25/2021, CONSULTATION DATE:  11/18 REFERRING MD:  Roderic Palau, CHIEF COMPLAINT:  concern for sepsis   History of Present Illness:  70 y/o male presented to the ER on 11/18 with confusion, was found to have a liver abscess and pneumoperitoneum which required emergent ex-lap the same day.  He was admitted on 11/8 with sepsis and hepatitis and eventually underwent ex lap with drainage of pyogenic liver abscesses and partial omenectomy on 11/18  Pertinent  Medical History  Asthma Cataract COPD Depression DM2 GERD HTN BPH Insomnia Hypothyroidism  Significant Hospital Events: Including procedures, antibiotic start and stop dates in addition to other pertinent events   11/18 went from ER to OR for exploratory laparotomy and drainage of pyogenic liver abscesses (2) and partial omentectomy 11/19 ID consulted, Extubated 11/20 Passed bedside swallow. ID consulted. 11/21 Confused, required mitts. Cleviprex gtt continues for hypertension. 11/22 Ongoing Cleviprex needs. O2 needs decreased. Still slow ROBF, consideration of TPN if unable to tolerate feeds. 11/22 cefepime discontinued, changed to ceftriaxone, metronidazole continued. 11/23 MRCP (could not complete study)> numerous hepatic abscesses with left portal vein thrombosis, fluid in abdomen may represent ascites, cholelithiasis, possible stone in cystic duct, bilatearl effusions, mild intrahepatic biliary ductal distension, generalized bowel edema 11/28 increasing O2 requirement and transitioned to Bipap, PCCM re-consulted   Interim History / Subjective:   Pt resting in bed and tolerating bipap, opens eyes to voice and nods to questions/gives short answers to questions and then falls back to sleep  Objective   Blood pressure (!) 173/59, pulse 90, temperature 98.4 F (36.9 C), temperature source Oral, resp. rate (!) 25, height 5\' 7"  (1.702 m), weight 97.5 kg,  SpO2 93 %.        Intake/Output Summary (Last 24 hours) at 08/04/2021 1948 Last data filed at 08/04/2021 8366 Gross per 24 hour  Intake 1768.43 ml  Output 1393 ml  Net 375.43 ml    Filed Weights   07/30/21 1900 07/31/21 0417 08/04/21 0818  Weight: 73.7 kg 86.6 kg 97.5 kg      General:  elderly M, sleeping in bed on bipap in no acute distress HEENT: MM pink/moist, pupils equal, sclera anicteric Neuro: opens eyes to voice and nods head to questions, falls back to sleep quickly CV: s1s2 rrr, no m/r/g PULM:  on Bipap 20/10 40% with decreased air movement in bilateral bases without significant rhonchi or wheezing, no retractions or accessory muscle use GI: soft, distended, midline dressings in place, minimal BS Extremities: warm/dry, 1+ edema  Skin: no rashes or lesions    Resolved Hospital Problem list     Assessment & Plan:   Acute Hypoxic  and Hypercarbic Respiratory Failure  ABG 7.28, pCO2 56, pO2 51 CXR with likely atelectasis -pt just started Bipap at the time of exam, repeat ABG at midnight, at risk for intubation if worsening -agree with Lasix  -currently covered with ceftriaxone and flagyl, follow blood and respiratory cultures -IS and flutter valve as able    COPD  Weak cough, some respiratory secretions 11/24 > improved 11/25 -continue Pulmicort and Duonebs   Rest of plan per primary team PCCM will continue to follow along with you  Best Practice (right click and "Reselect all SmartList Selections" daily)   Diet/type: TPN DVT prophylaxis: systemic heparin GI prophylaxis: N/A Lines: Central line and yes and it is still needed Foley:  N/A Code Status:  full code Last date  of multidisciplinary goals of care discussion [11/21]  Labs   CBC: Recent Labs  Lab 07/31/21 0343 08/01/21 0300 08/02/21 0347 08/03/21 0304 08/04/21 0223  WBC 14.0* 13.2* 14.7* 14.5* 14.2*  NEUTROABS  --   --  11.5*  --   --   HGB 11.6* 10.9* 10.8* 9.6* 10.4*  HCT 35.5*  34.0* 34.5* 31.3* 33.1*  MCV 93.9 96.3 96.9 97.5 99.1  PLT 216 239 237 226 251     Basic Metabolic Panel: Recent Labs  Lab 07/31/21 0343 08/01/21 0300 08/02/21 0347 08/03/21 0304 08/04/21 0223  NA 142 143 140 136 138  K 3.5 3.7 4.1 4.1 4.8  CL 111 112* 108 104 105  CO2 28 28 28 27 25   GLUCOSE 201* 207* 143* 174* 241*  BUN 18 18 20  24* 28*  CREATININE 0.59* 0.68 0.64 0.70 0.72  CALCIUM 7.4* 7.4* 7.4* 7.2* 7.6*  MG 1.8 2.0 2.0 1.8 1.8  PHOS 3.0 2.9 2.9 3.2 3.6    GFR: Estimated Creatinine Clearance: 92.9 mL/min (by C-G formula based on SCr of 0.72 mg/dL). Recent Labs  Lab 08/01/21 0300 08/02/21 0347 08/03/21 0304 08/04/21 0223  WBC 13.2* 14.7* 14.5* 14.2*     Liver Function Tests: Recent Labs  Lab 07/30/21 0408 07/31/21 0343 08/02/21 0347 08/03/21 0304 08/04/21 0223  AST 24 24 21 21 25   ALT 46* 33 24 19 18   ALKPHOS 121 124 133* 110 114  BILITOT 1.2 1.3* 0.9 0.6 0.5  PROT 5.5* 5.0* 5.1* 4.5* 5.1*  ALBUMIN <1.5* <1.5* <1.5* <1.5* <1.5*    No results for input(s): LIPASE, AMYLASE in the last 168 hours.  No results for input(s): AMMONIA in the last 168 hours.   ABG    Component Value Date/Time   PHART 7.285 (L) 08/04/2021 1835   PCO2ART 56.6 (H) 08/04/2021 1835   PO2ART 51.5 (L) 08/04/2021 1835   HCO3 26.1 08/04/2021 1835   TCO2 27 07/27/2021 0545   ACIDBASEDEF 1.0 07/26/2021 0039   O2SAT 80.0 08/04/2021 1835      Coagulation Profile: Recent Labs  Lab 07/30/21 2012  INR 1.4*     Cardiac Enzymes: No results for input(s): CKTOTAL, CKMB, CKMBINDEX, TROPONINI in the last 168 hours.   HbA1C: Hgb A1c MFr Bld  Date/Time Value Ref Range Status  04/18/2021 01:53 PM 12.2 (H) <5.7 % of total Hgb Final    Comment:    For someone without known diabetes, a hemoglobin A1c value of 6.5% or greater indicates that they may have  diabetes and this should be confirmed with a follow-up  test. . For someone with known diabetes, a value <7% indicates   that their diabetes is well controlled and a value  greater than or equal to 7% indicates suboptimal  control. A1c targets should be individualized based on  duration of diabetes, age, comorbid conditions, and  other considerations. . Currently, no consensus exists regarding use of hemoglobin A1c for diagnosis of diabetes for children. Marland Kitchen   12/18/2020 09:53 AM 12.5 (H) <5.7 % of total Hgb Final    Comment:    For someone without known diabetes, a hemoglobin A1c value of 6.5% or greater indicates that they may have  diabetes and this should be confirmed with a follow-up  test. . For someone with known diabetes, a value <7% indicates  that their diabetes is well controlled and a value  greater than or equal to 7% indicates suboptimal  control. A1c targets should be individualized based on  duration  of diabetes, age, comorbid conditions, and  other considerations. . Currently, no consensus exists regarding use of hemoglobin A1c for diagnosis of diabetes for children. .     CBG: Recent Labs  Lab 08/03/21 2336 08/04/21 0428 08/04/21 0748 08/04/21 1157 08/04/21 1605  GLUCAP 231* 213* 190* 191* 154*     Critical care time:  35 minutes    CRITICAL CARE Performed by: Otilio Carpen Nerea Bordenave   Total critical care time: 35 minutes  Critical care time was exclusive of separately billable procedures and treating other patients.  Critical care was necessary to treat or prevent imminent or life-threatening deterioration.  Critical care was time spent personally by me on the following activities: development of treatment plan with patient and/or surrogate as well as nursing, discussions with consultants, evaluation of patient's response to treatment, examination of patient, obtaining history from patient or surrogate, ordering and performing treatments and interventions, ordering and review of laboratory studies, ordering and review of radiographic studies, pulse oximetry and  re-evaluation of patient's condition.   Otilio Carpen Metha Kolasa, PA-C [facility] See Amion for pager If no response to pager , please call [phone] until 7pm After 7:00 pm call Elink  [phone]?[place]

## 2021-08-04 NOTE — Progress Notes (Signed)
Matthew Dominguez for Infectious Disease  Date of Admission:  07/25/2021   Total days of inpatient antibiotics 4  Principal Problem:   Severe sepsis (HCC) Active Problems:   Diabetes mellitus type 2, controlled ([place])   Essential hypertension, benign   COPD (chronic obstructive pulmonary disease) (HCC)   Mild cognitive impairment   Pyogenic liver abscess   Malnutrition of moderate degree   Portal vein thrombosis   Hypertensive urgency   Ileus, postoperative (HCC)   Acute metabolic encephalopathy          Assessment: 70 year old male with diabetes poorly controlled, COPD, mild cognitive impairment admitted for pneumoperitoneum secondary to ruptured liver abscess.  CT showed pneumoperitoneum, cholelithiasis, fatty liver.  On arrival LFTs elevated with a T bili of 3.5.  ID consult for antibiotic recommendations.   #Pneumoperitoneum 2/2 ruptured pyogenic liver abscess as a complication of suspected pyelophlebitis SP Ex lap on 11/18 # TPN #Uncontrolled DM A1c 12.5 on 12/18/20 #Chronic fatty liver disease, NASH: LFTs consistently elevated since 2017 #Remote Hx of IVDA(last use 1974)  -Pt continue to have leukocytosis despite being on IV antibiotics. May be a source control issue. Would like to have IR evaluate for possible aspiration as there are multiple abscesses. Pt is on TPN which carries increase risk of infection, as such obtain blood Cx.  -MRCP showed portal vein thrombosis and pt is on, heparin gtt. GI consulted.    Recommendations:  -Continue ceftriaxone and metronidazole -Repeat blood Cx -Engage IR for liver abscess drainage and source control -Follow GI recommendations -Anticipate atleast 4 weeks of antibiotics - HBV re-immunization on discharge   Microbiology:   Antibiotics: Cefepime 11/18- metronidazole 11/18-p Ceftriaxone  Cultures: Blood Cx: 11/19 NGTD     SUBJECTIVE: Pt is resting in bed. Reports he feels better. Abdomen is still tender.    Review of Systems: Review of Systems  All other systems reviewed and are negative.   Scheduled Meds:  amLODipine  10 mg Oral Daily   bisoprolol  10 mg Oral Daily   budesonide (PULMICORT) nebulizer solution  0.5 mg Nebulization BID   Chlorhexidine Gluconate Cloth  6 each Topical Daily   docusate sodium  100 mg Oral BID   guaiFENesin  15 mL Oral Q4H   insulin aspart  0-15 Units Subcutaneous Q4H   insulin glargine-yfgn  10 Units Subcutaneous BID   ipratropium-albuterol  3 mL Nebulization BID   losartan  100 mg Oral Daily   mouth rinse  15 mL Mouth Rinse BID   QUEtiapine  25 mg Oral QHS   sodium chloride flush  10-40 mL Intracatheter Q12H   sodium chloride flush  10-40 mL Intracatheter Q12H   tamsulosin  0.4 mg Oral Daily   Continuous Infusions:  sodium chloride 10 mL/hr at 08/03/21 1600   cefTRIAXone (ROCEPHIN)  IV Stopped (08/03/21 1334)   heparin 2,450 Units/hr (08/04/21 0100)   metronidazole 500 mg (08/04/21 0524)   TPN ADULT (ION) 90 mL/hr at 08/03/21 1817   TPN ADULT (ION)     PRN Meds:.sodium chloride, acetaminophen, albuterol, docusate, fentaNYL (SUBLIMAZE) injection, hydrALAZINE, [START ON 08/06/2021] influenza vaccine adjuvanted, labetalol, ondansetron (ZOFRAN) IV, polyethylene glycol No Known Allergies  OBJECTIVE: Vitals:   08/04/21 0426 08/04/21 0818 08/04/21 0821 08/04/21 0939  BP: (!) 146/57     Pulse: 97     Resp: 16     Temp: 98.4 F (36.9 C)   (!) 100.5 F (38.1 C)  TempSrc: Oral  Oral  SpO2:   95%   Weight:  97.5 kg    Height:       Body mass index is 33.67 kg/m.  Physical Exam Constitutional:      General: He is not in acute distress.    Appearance: He is normal weight. He is not toxic-appearing.  HENT:     Head: Normocephalic and atraumatic.     Right Ear: External ear normal.     Left Ear: External ear normal.     Nose: No congestion or rhinorrhea.     Mouth/Throat:     Mouth: Mucous membranes are moist.     Pharynx: Oropharynx is  clear.  Eyes:     Extraocular Movements: Extraocular movements intact.     Conjunctiva/sclera: Conjunctivae normal.     Pupils: Pupils are equal, round, and reactive to light.  Cardiovascular:     Rate and Rhythm: Normal rate and regular rhythm.     Heart sounds: No murmur heard.   No friction rub. No gallop.  Pulmonary:     Effort: Pulmonary effort is normal.     Breath sounds: Normal breath sounds.  Abdominal:     Palpations: Abdomen is soft.     Comments: Distended, tender  Musculoskeletal:        General: No swelling. Normal range of motion.     Cervical back: Normal range of motion and neck supple.  Skin:    General: Skin is warm and dry.  Neurological:     General: No focal deficit present.     Mental Status: He is oriented to person, place, and time.  Psychiatric:        Mood and Affect: Mood normal.      Lab Results Lab Results  Component Value Date   WBC 14.2 (H) 08/04/2021   HGB 10.4 (L) 08/04/2021   HCT 33.1 (L) 08/04/2021   MCV 99.1 08/04/2021   PLT 251 08/04/2021    Lab Results  Component Value Date   CREATININE 0.72 08/04/2021   BUN 28 (H) 08/04/2021   NA 138 08/04/2021   K 4.8 08/04/2021   CL 105 08/04/2021   CO2 25 08/04/2021    Lab Results  Component Value Date   ALT 18 08/04/2021   AST 25 08/04/2021   ALKPHOS 114 08/04/2021   BILITOT 0.5 08/04/2021        Laurice Record, [place] 08/04/2021, 12:09 PM

## 2021-08-04 NOTE — Progress Notes (Signed)
10 Days Post-Op  Subjective: CC: Patient reports mild pain of his upper abdomen that is stable from yesterday. No n/v. Currently npo. Last BM yesterday. He is unsure of flatus. Had to get I/O'd.   Objective: Vital signs in last 24 hours: Temp:  [97.8 F (36.6 C)-100.1 F (37.8 C)] 98.4 F (36.9 C) (11/28 0426) Pulse Rate:  [79-106] 97 (11/28 0426) Resp:  [16-28] 16 (11/28 0426) BP: (120-176)/(33-75) 146/57 (11/28 0426) SpO2:  [87 %-96 %] 95 % (11/28 0821) Weight:  [97.5 kg] 97.5 kg (11/28 0818) Last BM Date: 08/03/21  Intake/Output from previous day: 11/27 0701 - 11/28 0700 In: 3095.7 [P.O.:120; I.V.:2675.7; IV Piggyback:300] Out: 1558 [Urine:1293; Drains:265] Intake/Output this shift: No intake/output data recorded.  PE: Gen:  Awake and alert in NAD  Card:  Reg Pulm: On HF [place], expiratory b/l. Normal rate and effort. No rales or rhonchi Abd: More distended but soft, generalized tenderness greatest in the upper abdomen. +bowel sounds. Midline wound clean and without evidence of dehisence. RUQ drain with SS fluid in gravity bag without gross bile (output down from yesterday since switching to gravity bag 1.2L > 220cc). LUQ drain with SS fluid in bulb without gross bile  Psych: A&Ox2  Lab Results:  Recent Labs    08/03/21 0304 08/04/21 0223  WBC 14.5* 14.2*  HGB 9.6* 10.4*  HCT 31.3* 33.1*  PLT 226 251   BMET Recent Labs    08/03/21 0304 08/04/21 0223  NA 136 138  K 4.1 4.8  CL 104 105  CO2 27 25  GLUCOSE 174* 241*  BUN 24* 28*  CREATININE 0.70 0.72  CALCIUM 7.2* 7.6*   PT/INR No results for input(s): LABPROT, INR in the last 72 hours. CMP     Component Value Date/Time   NA 138 08/04/2021 0223   NA 142 01/15/2016 1031   K 4.8 08/04/2021 0223   CL 105 08/04/2021 0223   CO2 25 08/04/2021 0223   GLUCOSE 241 (H) 08/04/2021 0223   BUN 28 (H) 08/04/2021 0223   BUN 10 01/15/2016 1031   CREATININE 0.72 08/04/2021 0223   CREATININE 0.74 04/18/2021 1353    CALCIUM 7.6 (L) 08/04/2021 0223   PROT 5.1 (L) 08/04/2021 0223   PROT 6.2 01/15/2016 1031   ALBUMIN <1.5 (L) 08/04/2021 0223   ALBUMIN 4.1 01/15/2016 1031   AST 25 08/04/2021 0223   ALT 18 08/04/2021 0223   ALKPHOS 114 08/04/2021 0223   BILITOT 0.5 08/04/2021 0223   BILITOT 0.3 01/15/2016 1031   GFRNONAA >60 08/04/2021 0223   GFRNONAA 91 02/04/2021 0959   GFRAA 106 02/04/2021 0959   Lipase     Component Value Date/Time   LIPASE 36 07/27/2021 0420    Studies/Results: No results found.  Anti-infectives: Anti-infectives (From admission, onward)    Start     Dose/Rate Route Frequency Ordered Stop   07/29/21 1800  metroNIDAZOLE (FLAGYL) IVPB 500 mg        500 mg 100 mL/hr over 60 Minutes Intravenous Every 12 hours 07/29/21 0930     07/29/21 1400  cefTRIAXone (ROCEPHIN) 2 g in sodium chloride 0.9 % 100 mL IVPB        2 g 200 mL/hr over 30 Minutes Intravenous Every 24 hours 07/29/21 0945     07/26/21 1400  ceFEPIme (MAXIPIME) 2 g in sodium chloride 0.9 % 100 mL IVPB  Status:  Discontinued        2 g 200 mL/hr over 30 Minutes Intravenous  Every 8 hours 07/26/21 0721 07/29/21 0945   07/26/21 0800  ceFEPIme (MAXIPIME) 2 g in sodium chloride 0.9 % 100 mL IVPB  Status:  Discontinued        2 g 200 mL/hr over 30 Minutes Intravenous Every 12 hours 07/25/21 1853 07/26/21 0213   07/26/21 0600  metroNIDAZOLE (FLAGYL) IVPB 500 mg  Status:  Discontinued        500 mg 100 mL/hr over 60 Minutes Intravenous Every 8 hours 07/26/21 0119 07/29/21 0930   07/26/21 0600  ceFEPIme (MAXIPIME) 2 g in sodium chloride 0.9 % 100 mL IVPB  Status:  Discontinued        2 g 200 mL/hr over 30 Minutes Intravenous Every 12 hours 07/26/21 0225 07/26/21 0721   07/25/21 1900  ceFEPIme (MAXIPIME) 2 g in sodium chloride 0.9 % 100 mL IVPB        2 g 200 mL/hr over 30 Minutes Intravenous  Once 07/25/21 1845 07/25/21 2015   07/25/21 1900  metroNIDAZOLE (FLAGYL) IVPB 500 mg        500 mg 100 mL/hr over 60 Minutes  Intravenous  Once 07/25/21 1845 07/25/21 2138        Assessment/Plan POD 10 s/p Exploratory laparotomy with drainage of pyogenic liver abscess x 2, Partial omentectomy for Ruptured pyogenic liver abscesses by Dr. Dema Severin - 07/25/2021 - Cont IV abx. Appreciate ID's assistance. On Rocephin/Flagyl. MRCP w/ portal vein thrombosis extending into portal venous bifurcation with multiple hepatic abscesses. They suspect liver abscess are complication of pylephlebitis. GI following. ID recommending 4 week of abx. On heparin gtt.  - Continue drains and monitor output. High output from RUQ drain has gone down since switching to gravity bag. Question if high output was from some reactive ascites.  Drain output remains SS without gross bile.  - LFT's normalized - Patient with ROBF. Was on CLD w/o emesis. Seen by speech with some concern for aspiration (subtle coughing after consumption of thin liquids). Made NPO until can get further testing by speech for clearance/diet recommendations. - Continue PICC/TPN till tolerating PO better. Albumin < 1.5, pre-albumin <5 - Cont WTD BID to midline - Therapies, mobilize - Pulm toilet, on o2.    FEN - NPO until cleared by speech, IVF per TRH VTE - SCDs, heparin gtt ID - Rocephin/Flagyl. Tmax 100.1. Monitor leukocytosis, stable at 14.2 today Foley - out 11/22. I/O overnight. Monitor for retention   HTN  Dementia/delirium/agitation - on Seroquel COPD - wheezing this am. On scheduled duonebs. Asked RN to see if this morning's dose could be given early GERD DM2 Prior hx IVDU (1970's)   LOS: 9 days    Jillyn Ledger , [facility] 08/04/2021, 9:31 AM Please see Amion for pager number during day hours 7:00am-4:30pm

## 2021-08-04 NOTE — Progress Notes (Signed)
SLP Cancellation Note  Patient Details Name: Matthew Dominguez MRN: [id number] DOB: 11-11-1950   Cancelled treatment:       Reason Eval/Treat Not Completed: Other (comment). Scheduled MBS cancelled due to RN not available to travel with pt on 14 L of O2. Will defer MBS until tomorrow. Pt may continue puree/thin diet. Follow aspiration precautions including repositioning sufficiently upright for PO intake.    Chas Axel, Katherene Ponto 08/04/2021, 12:03 PM

## 2021-08-04 NOTE — Assessment & Plan Note (Signed)
-  Continue heparin gtt

## 2021-08-04 NOTE — Assessment & Plan Note (Signed)
No wheezing.   -Continue Budesonide

## 2021-08-04 NOTE — Care Management Important Message (Signed)
Important Message  Patient Details  Name: BENTLY WYSS MRN: [id number] Date of Birth: 1951/07/29   Medicare Important Message Given:  Yes     Joetta Manners 08/04/2021, 2:51 PM

## 2021-08-04 NOTE — Assessment & Plan Note (Signed)
-  Continue TPN  - SLP consult - Consult Gen Surg, apprecaite cares

## 2021-08-04 NOTE — Consult Note (Signed)
   [facility] Inpatient Consult   08/04/2021  TRAETON BORDAS Sep 14, 1950 [id number]  [facility] [ACO] Patient: [employer]   Primary Care Provider:  Sandrea Hughs, NP, [facility]  Patient screened for [facility] hospitalization to assess for potential [facility] Management service needs for post hospital transition.  Review of patient's medical record reveals patient is currently being recommended for a skilled nursing level of care.   Plan:  Continue to follow progress and disposition to assess for post hospital care management needs.    For questions contact:   Natividad Brood, RN BSN [facility] Liaison  [phone] business mobile phone Toll free office [phone]  Fax number: [phone] [email or web address]  '

## 2021-08-04 NOTE — Progress Notes (Addendum)
Speech Language Pathology Treatment: Dysphagia  Patient Details Name: Matthew Dominguez MRN: [id number] DOB: Feb 05, 1951 Today's Date: 08/04/2021 Time: 7829-5621 SLP Time Calculation (min) (ACUTE ONLY): 15 min  Assessment / Plan / Recommendation Clinical Impression  Pt today demonstrates no signs of aspiration or dysphagia with trials of thin liquids and puree. He does report difficulty swallowing at baseline and there has been concern when on full liquid diet for silent aspiration. Recommend pt proceed with instrumental swallowing assessment, though anticipate relatively normal swallowing. Will initiate purees and thin liquids so pt can have some PO; he is very dry and thirsty but does not have dentures present  HPI HPI: Pt is a 71 y.o. male who was brought to the ED after being found down at home by EMS. CT head negative. Pt s/p OR for exploratory laparotomy and drainage of pyogenic liver abscesses (2) and partial omentectomy 08/11/23. Admitted to the ICU postoperatively 08/11/2023 with sepsis and hepatitis secondary to ruptured pyogenic liver abscesses. [facility] passed on 2023-08-11 & 13-Aug-2023. ETT 2010/08/10/19. Pt was initally seen by SLP 11/21 and recommended to be NPO except ice chips due to mentation and s/s of possible aspiration. Therapy was then put on hold due to concern for ileus. PMH: HTN, HLD, DM, COPD, hypothyroidism, mild cognitive impairment.      SLP Plan  MBS      Recommendations for follow up therapy are one component of a multi-disciplinary discharge planning process, led by the attending physician.  Recommendations may be updated based on patient status, additional functional criteria and insurance authorization.    Recommendations  Diet recommendations: Dysphagia 1 (puree);Thin liquid Liquids provided via: Cup;Straw Medication Administration: Whole meds with puree Supervision: Staff to assist with self feeding Compensations: Slow rate;Small sips/bites;Minimize environmental distractions;Other  (Comment) Postural Changes and/or Swallow Maneuvers: Seated upright 90 degrees                Follow Up Recommendations: No SLP follow up Plan: MBS       GO                Thurza Kwiecinski, Katherene Ponto  08/04/2021, 10:09 AM

## 2021-08-04 NOTE — Progress Notes (Signed)
PHARMACY - TOTAL PARENTERAL NUTRITION CONSULT NOTE   Indication: Prolonged ileus  Patient Measurements: Height: 5\' 7"  (170.2 cm) Weight:  (bed malfunction. only appropriate items on bed with pt and wt. still unrealistically out of range.) IBW/kg (Calculated) : 66.1 TPN AdjBW (KG): 73.7 Body mass index is 29.9 kg/m.  Assessment: 69 yom with perforated liver abscess s/p partial omentectomy and drains in the right and left upper quadrants.  Speech evaluated for ability to swallow and deemed moderate aspiration risk with NPO status. KUB on 11/21 with possible ileus vs obstructive with worsening on 11/22 imaging. CCM wanted to hold off on TPN start 11/22 and push bowel regimen - BM documented but not significant, smear only per CCM. Pharmacy consulted to initiate TPN 11/23.  Glucose / Insulin: hx uncontrolled DM2. A1c 12.2% (PTA regimen: Lantus 58 units QHS + Novolog 18 units qAC + metformin but unclear compliance). CBGs 157-251 since new TPN hung with 35 units insulin in bag, also on Semglee (insulin glargine) 7 units BID + 31 units moderate SSI required in past 24 hours Electrolytes: K 4.8 - up from 4.1 (goal >/=4 with ileus), Mg 1.8 (goal >/=2 with ileus), CoCa ~9.6, Phos 3.6, Mg 1.8, others WNL Renal: SCr stable wnl. BUN WNL.  Hepatic: ALT/AST WNL. Tbili stable. Prealbumin <5, albumin <1.5, TG 75  Intake / Output; MIVF: NGT unable to be placed so far. Drain output down to 270 mL/24hrs, LBM 11/27 (4 BMs after enema yesterday), UOP 0.55 mL/kg/hr.  GI Imaging: 11/23 [place] and MR of abdomen - interval worsening of small bowel dilation suggesting ileus or partial obstruction GI Surgeries / Procedures: none since consult for TPN   Central access: PICC planned 11/23 TPN start date: 11/23  Nutritional Goals: Goal TPN rate is 90 mL/hr (provides 125 g of protein and 2200 kcals per day)   RD Assessment: Estimated Needs Total Energy Estimated Needs: 2200-2400 Total Protein Estimated Needs: 125-145  grams Total Fluid Estimated Needs: >2L  Current Nutrition:  Clear liquids - tolerating small sips, not yet advancing  Plan:  -Continue TPN at 90 mL/hr at 1800 -TPN will provide 125 g protein and 2200 kCal, meeting 100% of protein and kCal needs -Electrolytes in TPN: Continue Cl:Ac ratio 1:2, Na 50 mEq/L, decrease K to 40 mEq/L, Ca 5 mEq/L, increase Mg to 7 mEq/L, and Phos 15 mmol/L. Cl:Ac 1:2 -Add standard MVI and trace elements to TPN.  -Add Pepcid 40mg  to TPN (d/c current IV order) - Glargine increased to 10 units BID, Moderate q4h SSI for now and increase to 40 units regular insulin to TPN bag (added to account for TPN dextrose content). F/u CBGs and adjust as needed.  -Monitor TPN labs on Mon/Thurs -F/u resolution of ileus, ability to tolerate PO   Thank you for allowing pharmacy to be a part of this patient's care.  Ardyth Harps, PharmD Clinical Pharmacist

## 2021-08-04 NOTE — Progress Notes (Signed)
Progress Note    Matthew Dominguez   [id number]  DOB: 1950-10-18  DOA: 07/25/2021     9 Date of Service: 08/04/2021      Brief summary: Matthew Dominguez is a 70 y.o. M with DM, hypothyroidism, HTN COPD/asthma not on home O2, who presented with confusion and 1 day abdomina pain and vomiting.    In the ER, CT showed a perforated viscus and liver abscess.  He underwent emergent ex-lap, drainage of liver abscess, and omentectomy.      11/18 Admitted and taken to OR 11/19 ID consulted, extubated; BP hard to control, started Cleviprex 11/22 TPN initiated 11/23 MRCP showed multiple hepatic abscesses and PVT 11/27 IR consulted for perc drain 11/28 New fever, GI consulted re: extrahepatic causes of abscess      Assessment and Plan * Severe sepsis (HCC) -Continue Rocephin and flagyl  Liver abscess -Continue Rocephin and flagyl - Consult ID, GI, IR, Gen Surg appreciate cares   Fever New fever today.   - Check urine cultures and CXR  Acute respiratory failure with hypoxia (HCC) No O2 needs at baseline.  Mild COPD only.  Here, has been requiring high flow Morse and he desats to 80s with minimal exertion. - Obtain CXR  Portal vein thrombosis -Continue heparin gtt  Hypertensive urgency BP controlled -Continue amlodipine, bisopprolol, losartan  Ileus, postoperative (HCC) -Continue TPN  - SLP consult - Consult Gen Surg, apprecaite cares  Essential hypertension, benign     Acute metabolic encephalopathy Still delirious, maybe worse today with fever -Continue new Seroquel  Delirium precautions:   -Lights and TV off, minimize interruptions at night  -Blinds open and lights on during day  -Glasses/hearing aid with patient  -Frequent reorientation  -PT/OT when able  -Avoid sedation medications/Beers list medications    Mild cognitive impairment     Diabetes mellitus type 2, controlled (HCC) Glucose elevated -Continue Lantus, increse dose, discussed with TPN  pharmacist -Continue SS corrections  COPD (chronic obstructive pulmonary disease) (HCC) No wheezing.   -Continue Budesonide  Malnutrition of moderate degree        Subjective:  Patient had a mild fever today.  No change in confusion which persists.  No respiratory distress.  He desats with ambulation.  He is having some urinary retention.  Objective Vitals:   08/04/21 1226 08/04/21 1626 08/04/21 1629 08/04/21 1708  BP: (!) 154/49 (!) 173/59    Pulse:    90  Resp:    (!) 25  Temp: 99.2 F (37.3 C)  98.4 F (36.9 C)   TempSrc: Oral  Oral   SpO2:    93%  Weight:      Height:       97.5 kg  Vital signs were reviewed and remarkable for mild fever, persistent need for high flow nasal cannula   Exam Physical Exam Constitutional:      Appearance: He is ill-appearing. He is not toxic-appearing.  HENT:     Nose: Nose normal. No congestion or rhinorrhea.     Mouth/Throat:     Mouth: Mucous membranes are dry.  Eyes:     General: No scleral icterus.    Extraocular Movements: Extraocular movements intact.     Pupils: Pupils are equal, round, and reactive to light.  Cardiovascular:     Rate and Rhythm: Tachycardia present.     Heart sounds: No murmur heard.   No gallop.  Pulmonary:     Effort: Pulmonary effort is normal.     Breath  sounds: No stridor. Rhonchi present. No wheezing or rales.  Abdominal:     General: There is distension.     Palpations: Abdomen is soft.     Tenderness: There is abdominal tenderness. There is guarding.  Skin:    General: Skin is warm and dry.     Findings: No erythema or rash.  Neurological:     General: No focal deficit present.     Mental Status: He is disoriented.     Motor: Weakness present.  Psychiatric:        Mood and Affect: Affect is blunt.        Speech: Speech is delayed.        Behavior: Behavior is slowed. Behavior is cooperative.        Cognition and Memory: Cognition is impaired.       Labs / Other Information My  review of labs, imaging, notes and other tests is significant for Notable for hyperglycemia, leukocytosis to 14, hemoglobin up to 10, creatinine no change     Disposition Plan: Status is: Inpatient  Remains inpatient appropriate because: He remains on IV nutrition via TPN, still remains encephalopathic, and will require drain and weaning of oxygen.  Discussed with daughter by phone        Time spent: 35 minutes [facility] 08/04/2021, 5:45 PM
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
## 2021-08-04 NOTE — Progress Notes (Signed)
ANTICOAGULATION CONSULT NOTE  Pharmacy Consult for Heparin Indication: Portal Vein Thrombosis   No Known Allergies  Patient Measurements: Height: 5\' 7"  (170.2 cm) Weight:  (bed malfunction. only appropriate items on bed with pt and wt. still unrealistically out of range.) IBW/kg (Calculated) : 66.1 Heparin Dosing Weight: 73 kg   Vital Signs: Temp: 98.4 F (36.9 C) (11/28 0426) Temp Source: Oral (11/28 0426) BP: 146/57 (11/28 0426) Pulse Rate: 97 (11/28 0426)  Labs: Recent Labs    08/02/21 0347 08/02/21 1450 08/02/21 2246 08/03/21 0304 08/03/21 1025 08/04/21 0223  HGB 10.8*  --   --  9.6*  --  10.4*  HCT 34.5*  --   --  31.3*  --  33.1*  PLT 237  --   --  226  --  251  HEPARINUNFRC 0.28*   < > 0.30  --  0.36 0.42  CREATININE 0.64  --   --  0.70  --  0.72   < > = values in this interval not displayed.     Estimated Creatinine Clearance: 87.7 mL/min (by C-G formula based on SCr of 0.72 mg/dL).   Assessment: 70 yo male presented on 07/25/2021 after being found down with AMS found to have perforated liver abscess s/p partial omentectomy and drain placement. MRI with suspected portal vein thrombosis. Results of MRI dicussed with CCM and surgery. Pharmacy consulted to dose heparin.   Heparin level therapeutic at 0.42 on infusion at 2450 units/hr.Hgb stable at 10.4  Goal of Therapy:  Heparin level 0.3-0.7 units/ml Monitor platelets by anticoagulation protocol: Yes   Plan:  Continue heparin infusion at 2450 units/hr  Daily heparin level   Jaine Estabrooks A. Levada Dy, PharmD, BCPS, FNKF Clinical Pharmacist [place] Please utilize Amion for appropriate phone number to reach the unit pharmacist ([place])

## 2021-08-04 NOTE — TOC Progression Note (Signed)
Transition of Care [facility]) - Progression Note    Patient Details  Name: Matthew Dominguez MRN: [id number] Date of Birth: 02-21-1951  [facility] CM/SW Contact  Joanne Chars, LCSW Phone Number: 08/04/2021, 10:47 AM  Clinical Narrative:   CSW completed FL2 and faxed out pt referral to SNF.          Expected Discharge Plan and Services                                                 Social Determinants of Health (SDOH) Interventions    Readmission Risk Interventions No flowsheet data found.

## 2021-08-04 NOTE — Assessment & Plan Note (Signed)
Still delirious, maybe worse today with fever -Continue new Seroquel  Delirium precautions:   -Lights and TV off, minimize interruptions at night  -Blinds open and lights on during day  -Glasses/hearing aid with patient  -Frequent reorientation  -PT/OT when able  -Avoid sedation medications/[place] list medications

## 2021-08-04 NOTE — Assessment & Plan Note (Signed)
BP controlled -Continue amlodipine, bisopprolol, losartan

## 2021-08-04 NOTE — Assessment & Plan Note (Signed)
No O2 needs at baseline.  Mild COPD only.  Here, has been requiring high flow [place] and he desats to 80s with minimal exertion. - Obtain CXR

## 2021-08-04 NOTE — Progress Notes (Signed)
Pt had bloody d/c from penis during straight cath. Pt had no complaints.

## 2021-08-04 NOTE — Assessment & Plan Note (Signed)
-  Continue Rocephin and flagyl

## 2021-08-04 NOTE — Assessment & Plan Note (Signed)
New fever today.   - Check urine cultures and CXR

## 2021-08-04 NOTE — Progress Notes (Addendum)
Verbal order for foley given by Dr Loleta Books.   MD also called and left message for pt daughter.   Verbal order for ABG @0000  by Gleason, PA.

## 2021-08-05 ENCOUNTER — Inpatient Hospital Stay (HOSPITAL_COMMUNITY): Payer: Medicare HMO

## 2021-08-05 DIAGNOSIS — J432 Centrilobular emphysema: Secondary | ICD-10-CM | POA: Diagnosis not present

## 2021-08-05 DIAGNOSIS — E1169 Type 2 diabetes mellitus with other specified complication: Secondary | ICD-10-CM | POA: Diagnosis not present

## 2021-08-05 DIAGNOSIS — A419 Sepsis, unspecified organism: Secondary | ICD-10-CM | POA: Diagnosis not present

## 2021-08-05 DIAGNOSIS — G9341 Metabolic encephalopathy: Secondary | ICD-10-CM | POA: Diagnosis not present

## 2021-08-05 DIAGNOSIS — J9601 Acute respiratory failure with hypoxia: Secondary | ICD-10-CM | POA: Diagnosis not present

## 2021-08-05 DIAGNOSIS — K750 Abscess of liver: Secondary | ICD-10-CM | POA: Diagnosis not present

## 2021-08-05 DIAGNOSIS — J9602 Acute respiratory failure with hypercapnia: Secondary | ICD-10-CM | POA: Diagnosis not present

## 2021-08-05 LAB — POCT I-STAT 7, (LYTES, BLD GAS, ICA,H+H)
Acid-Base Excess: 6 mmol/L — ABNORMAL HIGH (ref 0.0–2.0)
Acid-Base Excess: 8 mmol/L — ABNORMAL HIGH (ref 0.0–2.0)
Bicarbonate: 33 mmol/L — ABNORMAL HIGH (ref 20.0–28.0)
Bicarbonate: 34.4 mmol/L — ABNORMAL HIGH (ref 20.0–28.0)
Calcium, Ion: 1.22 mmol/L (ref 1.15–1.40)
Calcium, Ion: 1.24 mmol/L (ref 1.15–1.40)
HCT: 30 % — ABNORMAL LOW (ref 39.0–52.0)
HCT: 31 % — ABNORMAL LOW (ref 39.0–52.0)
Hemoglobin: 10.2 g/dL — ABNORMAL LOW (ref 13.0–17.0)
Hemoglobin: 10.5 g/dL — ABNORMAL LOW (ref 13.0–17.0)
O2 Saturation: 89 %
O2 Saturation: 92 %
Patient temperature: 100.2
Patient temperature: 99.7
Potassium: 4.2 mmol/L (ref 3.5–5.1)
Potassium: 4.2 mmol/L (ref 3.5–5.1)
Sodium: 139 mmol/L (ref 135–145)
Sodium: 139 mmol/L (ref 135–145)
TCO2: 35 mmol/L — ABNORMAL HIGH (ref 22–32)
TCO2: 36 mmol/L — ABNORMAL HIGH (ref 22–32)
pCO2 arterial: 57.5 mmHg — ABNORMAL HIGH (ref 32.0–48.0)
pCO2 arterial: 58.1 mmHg — ABNORMAL HIGH (ref 32.0–48.0)
pH, Arterial: 7.365 (ref 7.350–7.450)
pH, Arterial: 7.388 (ref 7.350–7.450)
pO2, Arterial: 61 mmHg — ABNORMAL LOW (ref 83.0–108.0)
pO2, Arterial: 70 mmHg — ABNORMAL LOW (ref 83.0–108.0)

## 2021-08-05 LAB — BASIC METABOLIC PANEL
Anion gap: 4 — ABNORMAL LOW (ref 5–15)
BUN: 28 mg/dL — ABNORMAL HIGH (ref 8–23)
CO2: 30 mmol/L (ref 22–32)
Calcium: 7.9 mg/dL — ABNORMAL LOW (ref 8.9–10.3)
Chloride: 103 mmol/L (ref 98–111)
Creatinine, Ser: 0.74 mg/dL (ref 0.61–1.24)
GFR, Estimated: >60 mL/min (ref >60)
Glucose, Bld: 164 mg/dL — ABNORMAL HIGH (ref 70–99)
Potassium: 4.2 mmol/L (ref 3.5–5.1)
Sodium: 137 mmol/L (ref 135–145)

## 2021-08-05 LAB — CBC
HCT: 33.1 % — ABNORMAL LOW (ref 39.0–52.0)
Hemoglobin: 10 g/dL — ABNORMAL LOW (ref 13.0–17.0)
MCH: 29.9 pg (ref 26.0–34.0)
MCHC: 30.2 g/dL (ref 30.0–36.0)
MCV: 98.8 fL (ref 80.0–100.0)
Platelets: 268 10*3/uL (ref 150–400)
RBC: 3.35 MIL/uL — ABNORMAL LOW (ref 4.22–5.81)
RDW: 14.8 % (ref 11.5–15.5)
WBC: 13.4 10*3/uL — ABNORMAL HIGH (ref 4.0–10.5)
nRBC: 0 % (ref 0.0–0.2)

## 2021-08-05 LAB — BLOOD GAS, ARTERIAL
Acid-Base Excess: 3.8 mmol/L — ABNORMAL HIGH (ref 0.0–2.0)
Bicarbonate: 28.8 mmol/L — ABNORMAL HIGH (ref 20.0–28.0)
Drawn by: 56037
FIO2: 80
O2 Saturation: 85.4 %
Patient temperature: 37
pCO2 arterial: 50.9 mmHg — ABNORMAL HIGH (ref 32.0–48.0)
pH, Arterial: 7.37 (ref 7.350–7.450)
pO2, Arterial: 53.9 mmHg — ABNORMAL LOW (ref 83.0–108.0)

## 2021-08-05 LAB — URINE CULTURE: Culture: NO GROWTH

## 2021-08-05 LAB — GLUCOSE, CAPILLARY
Glucose-Capillary: 105 mg/dL — ABNORMAL HIGH (ref 70–99)
Glucose-Capillary: 129 mg/dL — ABNORMAL HIGH (ref 70–99)
Glucose-Capillary: 135 mg/dL — ABNORMAL HIGH (ref 70–99)
Glucose-Capillary: 149 mg/dL — ABNORMAL HIGH (ref 70–99)
Glucose-Capillary: 152 mg/dL — ABNORMAL HIGH (ref 70–99)
Glucose-Capillary: 157 mg/dL — ABNORMAL HIGH (ref 70–99)
Glucose-Capillary: 159 mg/dL — ABNORMAL HIGH (ref 70–99)

## 2021-08-05 LAB — HEPARIN LEVEL (UNFRACTIONATED)
Heparin Unfractionated: 0.45 IU/mL (ref 0.30–0.70)
Heparin Unfractionated: 0.34 IU/mL (ref 0.30–0.70)

## 2021-08-05 LAB — PROTIME-INR
INR: 1.2 (ref 0.8–1.2)
Prothrombin Time: 15.3 seconds — ABNORMAL HIGH (ref 11.4–15.2)

## 2021-08-05 LAB — PHOSPHORUS: Phosphorus: 4 mg/dL (ref 2.5–4.6)

## 2021-08-05 LAB — MAGNESIUM: Magnesium: 1.7 mg/dL (ref 1.7–2.4)

## 2021-08-05 MED ORDER — METOPROLOL TARTRATE 5 MG/5ML IV SOLN
INTRAVENOUS | Status: AC
  Filled 2021-08-05: qty 5

## 2021-08-05 MED ORDER — MAGNESIUM SULFATE 2 GM/50ML IV SOLN
2.0000 g | Freq: Once | INTRAVENOUS | Status: AC
  Administered 2021-08-05: 2 g via INTRAVENOUS
  Filled 2021-08-05: qty 50

## 2021-08-05 MED ORDER — GUAIFENESIN 100 MG/5ML PO LIQD
15.0000 mL | ORAL | Status: DC
  Administered 2021-08-06 – 2021-08-09 (×8): 15 mL via ORAL
  Filled 2021-08-05 (×8): qty 15

## 2021-08-05 MED ORDER — LORAZEPAM 2 MG/ML IJ SOLN
0.5000 mg | Freq: Once | INTRAMUSCULAR | Status: AC
  Administered 2021-08-05: 0.5 mg via INTRAVENOUS

## 2021-08-05 MED ORDER — METOPROLOL TARTRATE 5 MG/5ML IV SOLN
10.0000 mg | Freq: Four times a day (QID) | INTRAVENOUS | Status: DC
  Administered 2021-08-05 – 2021-08-14 (×37): 10 mg via INTRAVENOUS
  Filled 2021-08-05 (×34): qty 10

## 2021-08-05 MED ORDER — LORAZEPAM 2 MG/ML IJ SOLN
INTRAMUSCULAR | Status: AC
  Filled 2021-08-05: qty 1

## 2021-08-05 MED ORDER — TRAVASOL 10 % IV SOLN
INTRAVENOUS | Status: AC
  Filled 2021-08-05: qty 1252.8

## 2021-08-05 MED ORDER — FUROSEMIDE 10 MG/ML IJ SOLN
40.0000 mg | Freq: Two times a day (BID) | INTRAMUSCULAR | Status: DC
Start: 2021-08-05 — End: 2021-08-08
  Administered 2021-08-05 – 2021-08-07 (×6): 40 mg via INTRAVENOUS
  Filled 2021-08-05 (×5): qty 4

## 2021-08-05 NOTE — Assessment & Plan Note (Signed)
-  Continue TPN  - SLP consult - Consult Gen Surg, appreciate cares

## 2021-08-05 NOTE — Progress Notes (Signed)
RT NOTE: patient transported from 5N30 to room 2M02, on bipap, without complications.

## 2021-08-05 NOTE — Progress Notes (Addendum)
Occupational Therapy Treatment Patient Details Name: Matthew Dominguez MRN: [id number] DOB: 17-Nov-1950 Today's Date: 08/05/2021   History of present illness 70 yo admitted 11/18 after found down at home with AMS. Pt with sepsis due to ruptured liver abscess s/p ex lap with drainage same date. Pt intubated 11/18-11/19.  11/29 with respiratory failure, now on/off Bipap.  PMhx: DM, HTN, COPD, mild cognitive impairment   OT comments  Pt max - total A +2 for all ADLs and functional mobility this session, unable to respond to orientation questions. Requires max A +2 for sitting EOB for RN to administer meds. Pt on 14-15L HFNC during session, SpO2 in the low 80's upon arrival, increasing into the 90's once repositioned in sitting. Pt limited by decreased activity tolerance, balance, cognition, pain, ROM, and strength at this time. Pt transitioned to ICU after session, will continue to follow acutely. Recommend d/c to SNF.   Recommendations for follow up therapy are one component of a multi-disciplinary discharge planning process, led by the attending physician.  Recommendations may be updated based on patient status, additional functional criteria and insurance authorization.    Follow Up Recommendations  Skilled nursing-short term rehab (<3 hours/day)    Assistance Recommended at Discharge Frequent or constant Supervision/Assistance  Equipment Recommendations  None recommended by OT    Recommendations for Other Services PT consult    Precautions / Restrictions Precautions Precautions: Fall Precaution Comments: watch sats, Restrictions Weight Bearing Restrictions: No       Mobility Bed Mobility Overal bed mobility: Needs Assistance Bed Mobility: Sidelying to Sit;Rolling;Sit to Supine Rolling: Max assist;+2 for physical assistance   Supine to sit: Max assist;+2 for physical assistance;HOB elevated Sit to supine: Max assist;+2 for physical assistance;HOB elevated   General bed mobility  comments: verbal cues needed to move from sitting > sidelying    Transfers Overall transfer level: Needs assistance   Transfers: Sit to/from Stand Sit to Stand: Mod assist           General transfer comment: NT due to high O2 requirement and poor oxygenation     Balance Overall balance assessment: Needs assistance   Sitting balance-Leahy Scale: Zero Sitting balance - Comments: requires 2 person assist to remain upright                                   ADL either performed or assessed with clinical judgement   ADL Overall ADL's : Needs assistance/impaired Eating/Feeding: NPO   Grooming: Maximal assistance;Bed level   Upper Body Bathing: Maximal assistance;Bed level   Lower Body Bathing: Total assistance;+2 for physical assistance;Bed level   Upper Body Dressing : Maximal assistance   Lower Body Dressing: Total assistance;+2 for physical assistance   Toilet Transfer: Total assistance;+2 for physical assistance Toilet Transfer Details (indicate cue type and reason): unable Toileting- Clothing Manipulation and Hygiene: Total assistance;+2 for physical assistance;Bed level       Functional mobility during ADLs: Maximal assistance;+2 for physical assistance      Extremity/Trunk Assessment Upper Extremity Assessment Upper Extremity Assessment: Generalized weakness   Lower Extremity Assessment Lower Extremity Assessment: Defer to PT evaluation        Vision   Vision Assessment?: No apparent visual deficits   Perception Perception Perception: Not tested   Praxis Praxis Praxis: Not tested    Cognition Arousal/Alertness: Awake/alert Behavior During Therapy: Restless Overall Cognitive Status: Impaired/Different from baseline Area of Impairment: Orientation;Attention;Following commands;Safety/judgement;Problem solving  Orientation Level: Disoriented to;Place;Situation;Time Current Attention Level: Sustained Memory:  Decreased short-term memory Following Commands: Follows one step commands with increased time;Follows one step commands inconsistently Safety/Judgement: Decreased awareness of deficits Awareness: Emergent Problem Solving: Slow processing;Decreased initiation;Difficulty sequencing;Requires verbal cues;Requires tactile cues            Exercises     Shoulder Instructions       General Comments on 14-15L HFNC O2, RN reports did better earlier on Bipap, but worse on O2.    Pertinent Vitals/ Pain       Pain Assessment: No/denies pain  Home Living                                          Prior Functioning/Environment              Frequency  Min 2X/week        Progress Toward Goals  OT Goals(current goals can now be found in the care plan section)  Progress towards OT goals: OT to reassess next treatment  Acute Rehab OT Goals OT Goal Formulation: Patient unable to participate in goal setting Time For Goal Achievement: 08/13/21 Potential to Achieve Goals: Good ADL Goals Pt Will Perform Grooming: with min assist;sitting Pt Will Perform Upper Body Bathing: with mod assist;sitting Pt Will Transfer to Toilet: with mod assist;squat pivot transfer;bedside commode Pt Will Perform Toileting - Clothing Manipulation and hygiene: with max assist;sit to/from stand Additional ADL Goal #1: Pt will actively participate in 15 mins therapeutic activity with no more than 2 rest breaks Additional ADL Goal #2: Pt will consistently follow 1 step commands  Plan Discharge plan remains appropriate;Frequency remains appropriate    Co-evaluation    PT/OT/SLP Co-Evaluation/Treatment: Yes Reason for Co-Treatment: Complexity of the patient's impairments (multi-system involvement);Necessary to address cognition/behavior during functional activity;For patient/therapist safety PT goals addressed during session: Mobility/safety with mobility;Balance OT goals addressed during  session: ADL's and self-care      AM-PAC OT "6 Clicks" Daily Activity     Outcome Measure   Help from another person eating meals?: Total Help from another person taking care of personal grooming?: A Lot Help from another person toileting, which includes using toliet, bedpan, or urinal?: Total Help from another person bathing (including washing, rinsing, drying)?: Total Help from another person to put on and taking off regular upper body clothing?: Total Help from another person to put on and taking off regular lower body clothing?: Total 6 Click Score: 7    End of Session Equipment Utilized During Treatment: Oxygen  OT Visit Diagnosis: Unsteadiness on feet (R26.81);Cognitive communication deficit (R41.841)   Activity Tolerance Patient limited by fatigue   Patient Left in bed;with call bell/phone within reach;with bed alarm set;with nursing/sitter in room   Nurse Communication Mobility status        Time: 4801-6553 OT Time Calculation (min): 10 min  Charges: OT General Charges $OT Visit: 1 Visit OT Treatments $Self Care/Home Management : 8-22 mins  Matthew Dominguez, OTD, OTR/L Acute Rehab [phone]   Kaylyn Lim 08/05/2021, 1:24 PM

## 2021-08-05 NOTE — Progress Notes (Signed)
Pt placed back on BIPAP 20/8 due to pt complaining of SOB. RN at bedside.

## 2021-08-05 NOTE — Progress Notes (Signed)
Pt placed on HHFNC 20L/50%. Pt is tolerating well at this time.

## 2021-08-05 NOTE — Progress Notes (Signed)
Plan for liver abscess drain, heparin need to be held. Messaged MD to see if it was ok to hold heparin for planned procedure.. MD gave verbal order to hold heparin for procedure. Heparin put on hold.

## 2021-08-05 NOTE — Progress Notes (Addendum)
Attending physician's note   I have taken an interval history, reviewed the chart and examined the patient. I agree with the Advanced Practitioner's note, impression, and recommendations as outlined.   Patient transferred to the ICU for acute respiratory depression requiring BiPAP.  Will eventually plan for colonoscopy for evaluation of etiology of liver abscesses pending clinical improvement.  Certainly not well enough to undergo nonemergent, diagnostic colonoscopy, let alone bowel preparation.  Please contact the GI service when clinically improving and stable to undergo diagnostic studies.  [place], DO, FACG [phone] office                Daily Rounding Note  08/05/2021, 12:04 PM  LOS: 10 days   SUBJECTIVE:   Chief complaint:    Liver abscesses.  Portal vein thrombosis.  History to evening developed acute respiratory decompensation with increased work of breathing, hypoxia.  Placed back on BiPAP.   Pulmonary consulted.    OBJECTIVE:         Vital signs in last 24 hours:    Temp:  [98.2 F (36.8 C)-99.2 F (37.3 C)] 98.2 F (36.8 C) (11/29 0805) Pulse Rate:  [85-99] 85 (11/29 1120) Resp:  [25-30] 29 (11/29 1120) BP: (126-173)/(45-83) 134/63 (11/29 1120) SpO2:  [92 %-98 %] 94 % (11/29 1120) FiO2 (%):  [40 %-50 %] 50 % (11/29 0921) Weight:  [91.6 kg] 91.6 kg (11/29 0751) Last BM Date: 08/03/21 Filed Weights   07/31/21 0417 08/04/21 0818 08/05/21 0751  Weight: 86.6 kg 97.5 kg 91.6 kg   General: looks ill, on bipap   Did not re-examine, just eyeballed pt as he was transporting.    Intake/Output from previous day: 11/28 0701 - 11/29 0700 In: 1861.4 [I.V.:1665.3; IV Piggyback:196] Out: 2695 [Urine:2300; Drains:395]  Intake/Output this shift: Total I/O In: -  Out: 300 [Urine:300]  Lab Results: Recent Labs    08/03/21 0304 08/04/21 0223 08/05/21 0449  WBC 14.5* 14.2* 13.4*  HGB 9.6* 10.4*  10.0*  HCT 31.3* 33.1* 33.1*  PLT 226 251 268   BMET Recent Labs    08/03/21 0304 08/04/21 0223 08/05/21 0449  NA 136 138 137  K 4.1 4.8 4.2  CL 104 105 103  CO2 27 25 30   GLUCOSE 174* 241* 164*  BUN 24* 28* 28*  CREATININE 0.70 0.72 0.74  CALCIUM 7.2* 7.6* 7.9*   LFT Recent Labs    08/03/21 0304 08/04/21 0223  PROT 4.5* 5.1*  ALBUMIN <1.5* <1.5*  AST 21 25  ALT 19 18  ALKPHOS 110 114  BILITOT 0.6 0.5   PT/INR Recent Labs    08/05/21 0449  LABPROT 15.3*  INR 1.2   Hepatitis Panel No results for input(s): HEPBSAG, HCVAB, HEPAIGM, HEPBIGM in the last 72 hours.  Studies/Results: DG CHEST PORT 1 VIEW  Result Date: 08/05/2021 CLINICAL DATA:  Hypoxia EXAM: PORTABLE CHEST 1 VIEW COMPARISON:  Chest radiograph 1 day prior FINDINGS: A right upper extremity PICC is in stable position terminating at the cavoatrial junction. The cardiac silhouette is enlarged, unchanged. The upper mediastinal contours are stable. Patchy opacities projecting over the right midlung have significantly improved. There remains left basilar/retrocardiac consolidation, not significantly changed. There is a probable trace left pleural effusion. There is no significant right pleural effusion. There is no pneumothorax. There is no acute osseous abnormality. IMPRESSION: Patchy opacities in the right midlung have significantly improved. Left basilar/retrocardiac consolidation is unchanged, and there is a probable trace left pleural effusion. Electronically Signed  By: Valetta Mole M.D.   On: 08/05/2021 10:13   DG CHEST PORT 1 VIEW  Result Date: 08/04/2021 CLINICAL DATA:  Hypoxia. EXAM: PORTABLE CHEST 1 VIEW COMPARISON:  Chest radiograph dated 07/29/2021. FINDINGS: Right-sided PICC with tip at the cavoatrial junction. Shallow inspiration. Left lung base and right mid lung field densities may represent atelectasis or infiltrate. No large pleural effusion. No pneumothorax. Stable cardiomegaly. No acute osseous  pathology. Lower cervical ACDF. IMPRESSION: 1. Left lung base and right mid lung field densities may represent atelectasis or infiltrate. 2. Stable cardiomegaly. Electronically Signed   By: Anner Crete M.D.   On: 08/04/2021 19:44   US Abdomen Limited RUQ (LIVER/GB)  Result Date: 08/04/2021 CLINICAL DATA:  Liver abscesses. EXAM: ULTRASOUND ABDOMEN LIMITED RIGHT UPPER QUADRANT COMPARISON:  MRCP, dated July 30, 2021 FINDINGS: Gallbladder: A 2.4 cm gallstone is seen within the dependent portion of the gallbladder lumen. The gallbladder wall measures 7.2 mm in thickness. No sonographic Murphy sign noted by sonographer. Common bile duct: Diameter: 5.8 mm Liver: A 0.9 cm x 0.6 cm x 1.0 cm complex hypoechoic lesion is seen within the medial aspect of the left lobe of the liver. Additional, similar appearing 2.1 cm x 1.6 cm x 1.4 cm and 0.8 cm x 0.7 cm x 0.7 cm lesions are seen within the lateral aspect of the left lobe. Within normal limits in parenchymal echogenicity. Nonocclusive thrombus is seen within the left portal vein and main portal vein on color Doppler imaging with normal direction of blood flow towards the liver. This is seen on the prior MRI. Other: Limited study secondary to limited ability for the patient to cooperate during the exam. A small amount of ascites is seen within the right upper quadrant and right lower quadrant. IMPRESSION: 1. Multiple complex liver lesions consistent with the patient's history of multiple liver abscesses. 2. Ascites with subsequent gallbladder wall thickening. 3. Nonobstructive thrombus within the left portal vein and main portal vein which is seen on prior MRI. Electronically Signed   By: Virgina Norfolk M.D.   On: 08/04/2021 19:55    Scheduled Meds: . amLODipine  10 mg Oral Daily  . budesonide (PULMICORT) nebulizer solution  0.5 mg Nebulization BID  . Chlorhexidine Gluconate Cloth  6 each Topical Daily  . collagenase   Topical Daily  . docusate sodium   100 mg Oral BID  . furosemide  40 mg Intravenous BID  . guaiFENesin  15 mL Oral Q4H  . insulin aspart  0-15 Units Subcutaneous Q4H  . insulin glargine-yfgn  10 Units Subcutaneous BID  . ipratropium-albuterol  3 mL Nebulization BID  . mouth rinse  15 mL Mouth Rinse BID  . metoprolol tartrate  10 mg Intravenous Q6H  . QUEtiapine  25 mg Oral QHS  . sodium chloride flush  10-40 mL Intracatheter Q12H  . sodium chloride flush  10-40 mL Intracatheter Q12H  . tamsulosin  0.4 mg Oral Daily   Continuous Infusions: . sodium chloride 10 mL/hr at 08/03/21 1600  . cefTRIAXone (ROCEPHIN)  IV 2 g (08/04/21 1339)  . heparin 2,450 Units/hr (08/05/21 1153)  . magnesium sulfate bolus IVPB 2 g (08/05/21 1157)  . metronidazole 500 mg (08/05/21 0544)  . TPN ADULT (ION) 90 mL/hr at 08/04/21 1717  . TPN ADULT (ION)     PRN Meds:.sodium chloride, acetaminophen, albuterol, docusate, fentaNYL (SUBLIMAZE) injection, hydrALAZINE, [START ON 08/06/2021] influenza vaccine adjuvanted, labetalol, ondansetron (ZOFRAN) IV, polyethylene glycol   ASSESMENT:   PV thrombosis.  No evidence for cirrhosis or synthetic hepatic dysfunction.  On heparin drip.  Liver abscesses.  Etiology undetermined.  S/p ex lap, drainage of abscesses, placement of drains.  No clear organism identified on blood culture.  Fluid not sent for culture from surgical specimen.  Continues on Rocephin,  Flagyl.  LFTs normal.  Follow-up MRI showed numerous hepatic abscesses as well as the left PV thrombosis, abdominal fluid (ascites?),  Cholelithiasis, possible cystic duct stone, mild intrahepatic biliary ductal distention, generalized bowel wall edema.  Acute hypoxic, hypercarbic respiratory failure.  CCM planning transfer to ICU.    Dysphagia in setting of acute illness, some trouble w swallowing PTA.  SLP following.  MBS vs FEES on hold for now.      Malnutrition of moderate degree.  On TPN, is NPO.       PLAN   Ultimately plan to pursue  colonoscopy to evaluate for colon malignancy.  Please contact GI when you feel he is ready to undergo the procedure, is no urgency to pursue this.  GI signing off for now.      Azucena Freed  08/05/2021, 12:04 PM Phone [phone]

## 2021-08-05 NOTE — Assessment & Plan Note (Signed)
-  Continue heparin gtt

## 2021-08-05 NOTE — Assessment & Plan Note (Signed)
BP severe range on admission.  Subsequently better.

## 2021-08-05 NOTE — Assessment & Plan Note (Signed)
Glucose better, has had some hyperglycemia with TPN -Continue Lantus  -Continue SS corrections

## 2021-08-05 NOTE — Significant Event (Signed)
Rapid Response Event Note   Reason for Call :  Called by RT for pt having increasing oxygen requirements with increased work of breathing Pt was on 15L HFNC and working with PT at time of onset. Pt assisted back to bed and placed back on BiPAP.   Initial Focused Assessment:  Pt lying in bed, opens eyes to touch. Currently on BiPAP 20/8 75%. Breathing is regular, unlabored now. Abdomen distended, firm. He is disoriented, follows commands. Agitated with BiPAP mask.   VS: BP 134/63, HR 85, RR 29, SpO2 94% on BiPAP 20/8 75%  Interventions:  No intervention from RRT  Plan of Care:  Transfer to ICU  Event Summary:  MD Notified: S. Minor, NP at bedside upon my arrival. Decision made to transfer to ICU.  Call Time: [place] Time: 1125 End Time: [place], RN

## 2021-08-05 NOTE — Assessment & Plan Note (Signed)
At baseline, likely has some MCI.  No diagnosis of dementia

## 2021-08-05 NOTE — Progress Notes (Signed)
Progress Note    Matthew Dominguez   PPI:[id number]  DOB: [birth year]  DOA: 07/25/2021     10 Date of Service: 08/05/2021       Brief summary: Matthew Dominguez is a 70 y.o. M with DM, hypothyroidism, HTN COPD/asthma not on home O2, who presented with confusion and 1 day abdomina pain and vomiting.    In the ER, CT showed a perforated viscus and liver abscess.  He underwent emergent ex-lap, drainage of liver abscess, and omentectomy.      11/18 Admitted and taken to OR 11/19 ID consulted, extubated; BP hard to control, started Cleviprex 11/22 TPN initiated 11/23 MRCP showed multiple hepatic abscesses and PVT 11/27 IR consulted for perc drain 11/29: Respiratory decompensation overnight, transferred to ICU       Assessment and Plan Liver abscess This was the cause of sepsis - Continue Rocephin and Flagyl - Consult ID, GI, IR, Gen Surg appreciate cares   Fever - Follow urine and blood cultures - Obtain sputum culture   - Continue CTX and Flagyl  Acute respiratory failure with hypoxia (HCC) No O2 needs at baseline.  Mild COPD only. He was hypoxic on admission, improved to 1L by 11/24, subsequently worsening.  In last 36 hours, was needing 10+L HFNC and CXR yesterday showed bilateral opacities. Started on diuretic last night with 2L UOP, CXR this orning improved, but still significant hypoxia.  Compounded by hypoventilation due to belly swelling.    - Consult CCM re: transfer to ICU -Continue BiPAP -Continue Lasix 40 BID - Continue strict I/Os - Daily BMP  Portal vein thrombosis -Continue heparin gtt  Hypertensive urgency BP severe range on admission.  Subsequently better.    Ileus, postoperative (HCC) -Continue TPN  - SLP consult - Consult Gen Surg, appreciate cares  Essential hypertension, benign BP somewhat elevated today but very agitated - Increase metoprolol, change to IV -Continue amlodipnie -Hold lsoartan    Acute metabolic encephalopathy No  improvement -Continue new Seroquel   Mild cognitive impairment At baseline, likely has some MCI.  No diagnosis of dementia    Diabetes mellitus type 2, controlled ([place]) Glucose better, has had some hyperglycemia with TPN -Continue Lantus  -Continue SS corrections  COPD (chronic obstructive pulmonary disease) (HCC) No wheezing.   -Continue Budesonide - Consult CCM  Malnutrition of moderate degree      Acute urinary retention -Continue Flomax -Continue Foley, inserted 11/28 due to retention, diuresis and delirium     Subjective:  Patient is delirious.  He has had no fever overnight.  His respirations were comfortable on BiPAP overnight, this morning he was taken off because he was ripping off the BiPAP and he was very anxious, and desaturated with sitting on the edge of the bed.  Objective Vitals:   08/05/21 1330 08/05/21 1400 08/05/21 1430 08/05/21 1505  BP: (!) 133/46 (!) 118/46 (!) 146/50   Pulse: 84 (!) 101 100   Resp: (!) 26 (!) 27 20   Temp:    98.9 F (37.2 C)  TempSrc:    Axillary  SpO2: 96% 95% 95%   Weight:      Height:       91.6 kg  Notable for elevated heart rate, blood pressure, no fever since yesterday morning and 9 AM   Exam Physical Exam Constitutional:      General: He is in acute distress.     Appearance: He is obese. He is ill-appearing.  HENT:     Mouth/Throat:  Mouth: Mucous membranes are dry.     Pharynx: Posterior oropharyngeal erythema present.  Eyes:     General: No scleral icterus.    Extraocular Movements: Extraocular movements intact.     Pupils: Pupils are equal, round, and reactive to light.  Cardiovascular:     Rate and Rhythm: Tachycardia present. Rhythm irregular.     Heart sounds: No murmur heard.   No gallop.  Pulmonary:     Effort: Respiratory distress present.     Breath sounds: Decreased breath sounds and rhonchi present. No wheezing or rales.  Abdominal:     General: There is distension.     Tenderness:  There is abdominal tenderness.  Skin:    General: Skin is warm and dry.     Coloration: Skin is not jaundiced.  Neurological:     Mental Status: He is disoriented.     Cranial Nerves: No cranial nerve deficit.     Motor: Weakness present.  Psychiatric:        Attention and Perception: He is inattentive.        Behavior: Behavior is slowed.        Cognition and Memory: Cognition is impaired.       Labs / Other Information My review of labs, imaging, notes and other tests is significant for Chest x-ray improved this morning, fluid out of the fissure, personally reviewed.  White blood Cell count slightly down.  Hemoglobin low, no change.  Glucose good.  Creatinine stable, potassium normal.     Disposition Plan: Status is: Inpatient  Remains inpatient appropriate because: The patient remains IV nutrition dependent, requiring large amounts of oxygen, and very delirious.  He will need transfer to the ICU, and stabilization for a percutaneous drain.  His overall status is tenuous and critical.  Discussed with daughter Chong Sicilian at the bedside.  Discussed with ICU service.        Time spent: 35 minutes [facility] 08/05/2021, 3:18 PM

## 2021-08-05 NOTE — Assessment & Plan Note (Signed)
No O2 needs at baseline.  Mild COPD only. He was hypoxic on admission, improved to 1L by 11/24, subsequently worsening.  In last 36 hours, was needing 10+L HFNC and CXR yesterday showed bilateral opacities. Started on diuretic last night with 2L UOP, CXR this orning improved, but still significant hypoxia.  Compounded by hypoventilation due to belly swelling.    - Consult CCM re: transfer to ICU -Continue BiPAP -Continue Lasix 40 BID - Continue strict I/Os - Daily BMP

## 2021-08-05 NOTE — Progress Notes (Signed)
ANTICOAGULATION CONSULT NOTE  Pharmacy Consult for Heparin Indication: Portal Vein Thrombosis   No Known Allergies  Patient Measurements: Height: 5\' 7"  (170.2 cm) Weight: 91.6 kg (202 lb) IBW/kg (Calculated) : 66.1 Heparin Dosing Weight: 73 kg   Vital Signs: Temp: 98.4 F (36.9 C) (11/29 1947) Temp Source: Axillary (11/29 1947) BP: 156/52 (11/29 2004) Pulse Rate: 93 (11/29 2004)  Labs: Recent Labs    08/03/21 0304 08/03/21 1025 08/04/21 0223 08/05/21 0449 08/05/21 1534 08/05/21 2147  HGB 9.6*  --  10.4* 10.0* 10.5*  --   HCT 31.3*  --  33.1* 33.1* 31.0*  --   PLT 226  --  251 268  --   --   LABPROT  --   --   --  15.3*  --   --   INR  --   --   --  1.2  --   --   HEPARINUNFRC  --    < > 0.42 0.45  --  0.34  CREATININE 0.70  --  0.72 0.74  --   --    < > = values in this interval not displayed.     Estimated Creatinine Clearance: 90.1 mL/min (by C-G formula based on SCr of 0.74 mg/dL).   Assessment: 70 yo male presented on 07/25/2021 after being found down with AMS found to have perforated liver abscess s/p partial omentectomy and drain placement. MRI with suspected portal vein thrombosis. Results of MRI dicussed with CCM and surgery. Pharmacy consulted to dose heparin.   Heparin level therapeutic at 0.34 on infusion at 2450 units/hr.Hgb stable 10s. No bleeding or IV issues noted.   Goal of Therapy:  Heparin level 0.3-0.7 units/ml Monitor platelets by anticoagulation protocol: Yes   Plan:  Continue heparin infusion at 2450 units/hr  Daily heparin level   Erin Hearing PharmD., BCPS Clinical Pharmacist 08/05/2021 10:09 PM

## 2021-08-05 NOTE — Assessment & Plan Note (Signed)
No improvement -Continue new Seroquel

## 2021-08-05 NOTE — Assessment & Plan Note (Signed)
BP somewhat elevated today but very agitated - Increase metoprolol, change to IV -Continue amlodipnie -Hold lsoartan

## 2021-08-05 NOTE — Progress Notes (Signed)
Into room pt noted increased work of breathing. O2 saturation 94% on HFNC 15LPM, pt desated to 80% HFNC. Placed pt. Back on bipap, Respiratory called. NP was at bedside. MD paged to come to bedside. See new orders

## 2021-08-05 NOTE — Assessment & Plan Note (Signed)
-   Follow urine and blood cultures - Obtain sputum culture   - Continue CTX and Flagyl

## 2021-08-05 NOTE — Progress Notes (Signed)
ANTICOAGULATION CONSULT NOTE  Pharmacy Consult for Heparin Indication: Portal Vein Thrombosis   No Known Allergies  Patient Measurements: Height: 5\' 7"  (170.2 cm) Weight: 97.5 kg (214 lb 15.2 oz) IBW/kg (Calculated) : 66.1 Heparin Dosing Weight: 73 kg   Vital Signs: Temp: 98.6 F (37 C) (11/28 2100) Temp Source: Oral (11/28 2100) BP: 138/45 (11/29 0600) Pulse Rate: 94 (11/29 0600)  Labs: Recent Labs    08/03/21 0304 08/03/21 1025 08/04/21 0223 08/05/21 0449  HGB 9.6*  --  10.4* 10.0*  HCT 31.3*  --  33.1* 33.1*  PLT 226  --  251 268  LABPROT  --   --   --  15.3*  INR  --   --   --  1.2  HEPARINUNFRC  --  0.36 0.42 0.45  CREATININE 0.70  --  0.72 0.74     Estimated Creatinine Clearance: 92.9 mL/min (by C-G formula based on SCr of 0.74 mg/dL).   Assessment: 70 yo male presented on 07/25/2021 after being found down with AMS found to have perforated liver abscess s/p partial omentectomy and drain placement. MRI with suspected portal vein thrombosis. Results of MRI dicussed with CCM and surgery. Pharmacy consulted to dose heparin.   Heparin level therapeutic at 0.45 on infusion at 2450 units/hr.Hgb stable at 10  Goal of Therapy:  Heparin level 0.3-0.7 units/ml Monitor platelets by anticoagulation protocol: Yes   Plan:  Continue heparin infusion at 2450 units/hr  Daily heparin level   Wednesday Ericsson A. Levada Dy, PharmD, BCPS, FNKF Clinical Pharmacist [place] Please utilize Amion for appropriate phone number to reach the unit pharmacist ([place])

## 2021-08-05 NOTE — Progress Notes (Signed)
SLP Cancellation Note  Patient Details Name: DAEMYN GARIEPY MRN: [id number] DOB: 10/26/1950   Cancelled treatment:       Reason Eval/Treat Not Completed: Patient not medically ready. Await improved medical stability for further SLP assessment. Will follow chart   Jozef Eisenbeis, Katherene Ponto 08/05/2021, 7:57 AM

## 2021-08-05 NOTE — Progress Notes (Signed)
11 Days Post-Op  Subjective: CC: Events noted overnight. Patient now on HFNC. He remains A&O x2. Complains of sob and some upper abdominal pain. No n/v. Remains NPO. Last BM 11/27.   Objective: Vital signs in last 24 hours: Temp:  [98.2 F (36.8 C)-100.5 F (38.1 C)] 98.2 F (36.8 C) (11/29 0805) Pulse Rate:  [90-99] 94 (11/29 0600) Resp:  [25-30] 25 (11/29 0600) BP: (126-173)/(45-83) 126/83 (11/29 0805) SpO2:  [92 %-97 %] 97 % (11/29 0600) FiO2 (%):  [40 %-50 %] 50 % (11/29 0500) Weight:  [91.6 kg] 91.6 kg (11/29 0751) Last BM Date: 08/03/21  Intake/Output from previous day: 11/28 0701 - 11/29 0700 In: 1861.4 [I.V.:1665.3; IV Piggyback:196] Out: 2695 [Urine:2300; Drains:395] Intake/Output this shift: Total I/O In: -  Out: 300 [Urine:300]  PE: Gen:  Awake and alert Abd: Distended but soft, some epigastric and ruq tenderness. +bowel sounds. Midline wound clean and without evidence of dehisence. RUQ drain with SS fluid in gravity bag without gross bile. LUQ drain with SS fluid in bulb without gross bile  Psych: A&Ox2  Lab Results:  Recent Labs    08/04/21 0223 08/05/21 0449  WBC 14.2* 13.4*  HGB 10.4* 10.0*  HCT 33.1* 33.1*  PLT 251 268   BMET Recent Labs    08/04/21 0223 08/05/21 0449  NA 138 137  K 4.8 4.2  CL 105 103  CO2 25 30  GLUCOSE 241* 164*  BUN 28* 28*  CREATININE 0.72 0.74  CALCIUM 7.6* 7.9*   PT/INR Recent Labs    08/05/21 0449  LABPROT 15.3*  INR 1.2   CMP     Component Value Date/Time   NA 137 08/05/2021 0449   NA 142 01/15/2016 1031   K 4.2 08/05/2021 0449   CL 103 08/05/2021 0449   CO2 30 08/05/2021 0449   GLUCOSE 164 (H) 08/05/2021 0449   BUN 28 (H) 08/05/2021 0449   BUN 10 01/15/2016 1031   CREATININE 0.74 08/05/2021 0449   CREATININE 0.74 04/18/2021 1353   CALCIUM 7.9 (L) 08/05/2021 0449   PROT 5.1 (L) 08/04/2021 0223   PROT 6.2 01/15/2016 1031   ALBUMIN <1.5 (L) 08/04/2021 0223   ALBUMIN 4.1 01/15/2016 1031    AST 25 08/04/2021 0223   ALT 18 08/04/2021 0223   ALKPHOS 114 08/04/2021 0223   BILITOT 0.5 08/04/2021 0223   BILITOT 0.3 01/15/2016 1031   GFRNONAA >60 08/05/2021 0449   GFRNONAA 91 02/04/2021 0959   GFRAA 106 02/04/2021 0959   Lipase     Component Value Date/Time   LIPASE 36 07/27/2021 0420    Studies/Results: DG CHEST PORT 1 VIEW  Result Date: 08/04/2021 CLINICAL DATA:  Hypoxia. EXAM: PORTABLE CHEST 1 VIEW COMPARISON:  Chest radiograph dated 07/29/2021. FINDINGS: Right-sided PICC with tip at the cavoatrial junction. Shallow inspiration. Left lung base and right mid lung field densities may represent atelectasis or infiltrate. No large pleural effusion. No pneumothorax. Stable cardiomegaly. No acute osseous pathology. Lower cervical ACDF. IMPRESSION: 1. Left lung base and right mid lung field densities may represent atelectasis or infiltrate. 2. Stable cardiomegaly. Electronically Signed   By: Anner Crete M.D.   On: 08/04/2021 19:44   US Abdomen Limited RUQ (LIVER/GB)  Result Date: 08/04/2021 CLINICAL DATA:  Liver abscesses. EXAM: ULTRASOUND ABDOMEN LIMITED RIGHT UPPER QUADRANT COMPARISON:  MRCP, dated July 30, 2021 FINDINGS: Gallbladder: A 2.4 cm gallstone is seen within the dependent portion of the gallbladder lumen. The gallbladder wall measures 7.2 mm in  thickness. No sonographic Murphy sign noted by sonographer. Common bile duct: Diameter: 5.8 mm Liver: A 0.9 cm x 0.6 cm x 1.0 cm complex hypoechoic lesion is seen within the medial aspect of the left lobe of the liver. Additional, similar appearing 2.1 cm x 1.6 cm x 1.4 cm and 0.8 cm x 0.7 cm x 0.7 cm lesions are seen within the lateral aspect of the left lobe. Within normal limits in parenchymal echogenicity. Nonocclusive thrombus is seen within the left portal vein and main portal vein on color Doppler imaging with normal direction of blood flow towards the liver. This is seen on the prior MRI. Other: Limited study secondary  to limited ability for the patient to cooperate during the exam. A small amount of ascites is seen within the right upper quadrant and right lower quadrant. IMPRESSION: 1. Multiple complex liver lesions consistent with the patient's history of multiple liver abscesses. 2. Ascites with subsequent gallbladder wall thickening. 3. Nonobstructive thrombus within the left portal vein and main portal vein which is seen on prior MRI. Electronically Signed   By: Virgina Norfolk M.D.   On: 08/04/2021 19:55    Anti-infectives: Anti-infectives (From admission, onward)    Start     Dose/Rate Route Frequency Ordered Stop   07/29/21 1800  metroNIDAZOLE (FLAGYL) IVPB 500 mg        500 mg 100 mL/hr over 60 Minutes Intravenous Every 12 hours 07/29/21 0930     07/29/21 1400  cefTRIAXone (ROCEPHIN) 2 g in sodium chloride 0.9 % 100 mL IVPB        2 g 200 mL/hr over 30 Minutes Intravenous Every 24 hours 07/29/21 0945     07/26/21 1400  ceFEPIme (MAXIPIME) 2 g in sodium chloride 0.9 % 100 mL IVPB  Status:  Discontinued        2 g 200 mL/hr over 30 Minutes Intravenous Every 8 hours 07/26/21 0721 07/29/21 0945   07/26/21 0800  ceFEPIme (MAXIPIME) 2 g in sodium chloride 0.9 % 100 mL IVPB  Status:  Discontinued        2 g 200 mL/hr over 30 Minutes Intravenous Every 12 hours 07/25/21 1853 07/26/21 0213   07/26/21 0600  metroNIDAZOLE (FLAGYL) IVPB 500 mg  Status:  Discontinued        500 mg 100 mL/hr over 60 Minutes Intravenous Every 8 hours 07/26/21 0119 07/29/21 0930   07/26/21 0600  ceFEPIme (MAXIPIME) 2 g in sodium chloride 0.9 % 100 mL IVPB  Status:  Discontinued        2 g 200 mL/hr over 30 Minutes Intravenous Every 12 hours 07/26/21 0225 07/26/21 0721   07/25/21 1900  ceFEPIme (MAXIPIME) 2 g in sodium chloride 0.9 % 100 mL IVPB        2 g 200 mL/hr over 30 Minutes Intravenous  Once 07/25/21 1845 07/25/21 2015   07/25/21 1900  metroNIDAZOLE (FLAGYL) IVPB 500 mg        500 mg 100 mL/hr over 60 Minutes  Intravenous  Once 07/25/21 1845 07/25/21 2138        Assessment/Plan POD 11 s/p Exploratory laparotomy with drainage of pyogenic liver abscess x 2, Partial omentectomy for Ruptured pyogenic liver abscesses by Dr. Dema Severin - 07/25/2021 - Cont IV abx. Appreciate ID's assistance. On Rocephin/Flagyl. MRCP w/ portal vein thrombosis extending into portal venous bifurcation with multiple hepatic abscesses. They suspect liver abscess are complication of pylephlebitis. ID recommending 4 week of abx. On heparin gtt. RUQ [place] 11/28 w/  multiple complex liver lesions measuring < 3 cm c/w mult liver abscesses; ascites with subsequent GB wall thickening; and nonobstructive thrombus within the left portal vein and main portal vein. GI following. They have asked IR to see for possible aspiration. Appears another CT A/P has been ordered as well. We will follow up on these results.  - Continue drains and monitor output. High output from RUQ drain has gone down since switching to gravity bag. Suspect high output was from ascites.  Drain output remains SS without gross bile.  - LFT's normalized - Patient with ROBF. Was on CLD w/o emesis. Seen by speech with some concern for aspiration (subtle coughing after consumption of thin liquids). Made NPO until can get further testing by speech for clearance/diet recommendations. - Continue PICC/TPN till tolerating PO better. Albumin < 1.5, pre-albumin <5 - Cont WTD BID to midline - Therapies, mobilize - Pulm toilet   FEN - NPO until cleared by speech, IVF per TRH VTE - SCDs, heparin gtt ID - Rocephin/Flagyl. Tmax 100.5. Monitor leukocytosis, 13.4 from 14.2 Foley - in place, per primary    HTN  Dementia/delirium/agitation - on Seroquel Acute respiratory failure - on BiPAP overnight. CCM following.  COPD - On scheduled duonebs.  GERD DM2 Prior hx IVDU (1970's)   LOS: 10 days    Jillyn Ledger , [facility] 08/05/2021, 8:53 AM Please see Amion for  pager number during day hours 7:00am-4:30pm

## 2021-08-05 NOTE — Progress Notes (Signed)
[place] for Infectious Disease  Date of Admission:  07/25/2021   Total days of inpatient antibiotics 4  Principal Problem:   Severe sepsis (HCC) Active Problems:   Diabetes mellitus type 2, controlled ([place])   Essential hypertension, benign   COPD (chronic obstructive pulmonary disease) (HCC)   Mild cognitive impairment   Liver abscess   Malnutrition of moderate degree   Portal vein thrombosis   Hypertensive urgency   Ileus, postoperative (HCC)   Acute metabolic encephalopathy   Acute respiratory failure with hypoxia (HCC)   Fever   Acute urinary retention          Assessment: 70 year old male with diabetes poorly controlled, COPD, mild cognitive impairment admitted for pneumoperitoneum secondary to ruptured liver abscess.  CT showed pneumoperitoneum, cholelithiasis, fatty liver.  On arrival LFTs elevated with a T bili of 3.5.  ID consult for antibiotic recommendations.   #Pneumoperitoneum 2/2 ruptured pyogenic liver abscess as a complication of suspected pyelophlebitis SP Ex lap on 11/18 # TPN #Uncontrolled DM A1c 12.5 on 12/18/20 #Chronic fatty liver disease, NASH: LFTs consistently elevated since 2017 #Remote Hx of IVDA(last use 1974) -MRCP showed portal vein thrombosis  and multiple liver abscesses, mild intrahepatic biliary ductal distension. GI consulted. Plan on colonoscopy in the future to evaluate for colon malignancy. Pt is on antibiotics+heparin gtt for possible pyelophlebitis.  -Pt continue to have leukocytosis despite being on IV antibiotics. May be a source control issue. IR consulted and recommend CT AP for aspiration. Pt was started on TPN which carries increase risk of infection, Blood Cx pending    Recommendations:  -Continue ceftriaxone and metronidazole -Follow blood Cx -Follow-up cultures when liver abscess aspirated. IR recommends CT AP w/contrast when pt more stable -BiPAP overnight due to hypoxia.  Suspect increased O2 demand is 2/2  volume overload as pt was 84 kg(11/18)on admission and 97.5Kg on 11/28.  -Anticipate atleast 4 weeks of antibiotics - HBV re-immunization on discharge   Microbiology:   Antibiotics: Cefepime 11/18- metronidazole 11/18-p Ceftriaxone   Cultures: Blood Cx: 11/19 NGTD, 11/29 Urine Cx: 11/29      SUBJECTIVE: Pt is on BiPAP. He reorts his abdomen feels the same Interval: needed Bipap overnight due to increased O2 requirement.   Review of Systems: Review of Systems  All other systems reviewed and are negative.   Scheduled Meds:  amLODipine  10 mg Oral Daily   budesonide (PULMICORT) nebulizer solution  0.5 mg Nebulization BID   Chlorhexidine Gluconate Cloth  6 each Topical Daily   collagenase   Topical Daily   docusate sodium  100 mg Oral BID   furosemide  40 mg Intravenous BID   guaiFENesin  15 mL Oral Q4H   insulin aspart  0-15 Units Subcutaneous Q4H   insulin glargine-yfgn  10 Units Subcutaneous BID   ipratropium-albuterol  3 mL Nebulization BID   mouth rinse  15 mL Mouth Rinse BID   metoprolol tartrate  10 mg Intravenous Q6H   QUEtiapine  25 mg Oral QHS   sodium chloride flush  10-40 mL Intracatheter Q12H   sodium chloride flush  10-40 mL Intracatheter Q12H   tamsulosin  0.4 mg Oral Daily   Continuous Infusions:  sodium chloride 10 mL/hr at 08/03/21 1600   cefTRIAXone (ROCEPHIN)  IV 2 g (08/05/21 1323)   heparin 2,450 Units/hr (08/05/21 1153)   metronidazole 500 mg (08/05/21 0544)   TPN ADULT (ION) 90 mL/hr at 08/04/21 1717   TPN ADULT (  ION)     PRN Meds:.sodium chloride, acetaminophen, albuterol, docusate, fentaNYL (SUBLIMAZE) injection, hydrALAZINE, [START ON 08/06/2021] influenza vaccine adjuvanted, labetalol, ondansetron (ZOFRAN) IV, polyethylene glycol No Known Allergies  OBJECTIVE: Vitals:   08/05/21 0805 08/05/21 0920 08/05/21 0921 08/05/21 1120  BP: 126/83  126/83 134/63  Pulse:   89 85  Resp:   (!) 25 (!) 29  Temp: 98.2 F (36.8 C)     TempSrc:       SpO2:  98% 98% 94%  Weight:      Height:       Body mass index is 31.64 kg/m.  Physical Exam Constitutional:      General: He is not in acute distress.    Appearance: He is normal weight. He is not toxic-appearing.  HENT:     Head: Normocephalic and atraumatic.     Right Ear: External ear normal.     Left Ear: External ear normal.     Nose: No congestion or rhinorrhea.     Mouth/Throat:     Mouth: Mucous membranes are moist.     Pharynx: Oropharynx is clear.  Eyes:     Extraocular Movements: Extraocular movements intact.     Conjunctiva/sclera: Conjunctivae normal.     Pupils: Pupils are equal, round, and reactive to light.  Cardiovascular:     Rate and Rhythm: Normal rate and regular rhythm.     Heart sounds: No murmur heard.   No friction rub. No gallop.  Pulmonary:     Effort: Pulmonary effort is normal.     Breath sounds: Normal breath sounds.  Abdominal:     Palpations: Abdomen is soft.     Comments: Distended, tender  Musculoskeletal:        General: No swelling. Normal range of motion.     Cervical back: Normal range of motion and neck supple.  Skin:    General: Skin is warm and dry.  Neurological:     General: No focal deficit present.     Mental Status: He is oriented to person, place, and time.  Psychiatric:        Mood and Affect: Mood normal.      Lab Results Lab Results  Component Value Date   WBC 13.4 (H) 08/05/2021   HGB 10.0 (L) 08/05/2021   HCT 33.1 (L) 08/05/2021   MCV 98.8 08/05/2021   PLT 268 08/05/2021    Lab Results  Component Value Date   CREATININE 0.74 08/05/2021   BUN 28 (H) 08/05/2021   NA 137 08/05/2021   K 4.2 08/05/2021   CL 103 08/05/2021   CO2 30 08/05/2021    Lab Results  Component Value Date   ALT 18 08/04/2021   AST 25 08/04/2021   ALKPHOS 114 08/04/2021   BILITOT 0.5 08/04/2021        Laurice Record, [place] for Infectious Disease [facility] 08/05/2021, 2:12 PM

## 2021-08-05 NOTE — Progress Notes (Signed)
NAME:  Matthew Dominguez, MRN:  [id number], DOB:  1951/04/04, LOS: 21 ADMISSION DATE:  07/25/2021, CONSULTATION DATE:  11/18 REFERRING MD:  Roderic Palau, CHIEF COMPLAINT:  concern for sepsis   History of Present Illness:  70 y/o male presented to the ER on 11/18 with confusion, was found to have a liver abscess and pneumoperitoneum which required emergent ex-lap the same day.  He was admitted on 11/8 with sepsis and hepatitis and eventually underwent ex lap with drainage of pyogenic liver abscesses and partial omenectomy on 11/18  Pertinent  Medical History  Asthma Cataract COPD Depression DM2 GERD HTN BPH Insomnia Hypothyroidism  Significant Hospital Events: Including procedures, antibiotic start and stop dates in addition to other pertinent events   11/18 went from ER to OR for exploratory laparotomy and drainage of pyogenic liver abscesses (2) and partial omentectomy 11/19 ID consulted, Extubated 11/20 Passed bedside swallow. ID consulted. 11/21 Confused, required mitts. Cleviprex gtt continues for hypertension. 11/22 Ongoing Cleviprex needs. O2 needs decreased. Still slow ROBF, consideration of TPN if unable to tolerate feeds. 11/22 cefepime discontinued, changed to ceftriaxone, metronidazole continued. 11/23 MRCP (could not complete study)> numerous hepatic abscesses with left portal vein thrombosis, fluid in abdomen may represent ascites, cholelithiasis, possible stone in cystic duct, bilatearl effusions, mild intrahepatic biliary ductal distension, generalized bowel edema 11/28 increasing O2 requirement and transitioned to Bipap, PCCM re-consulted 08/05/2021 increasing FiO2 needs desaturations poor air movement transfer to ICU questionable intubation.   Interim History / Subjective:   In obvious respiratory distress will transfer to intensive care unit may need intubation  Objective   Blood pressure 126/83, pulse 89, temperature 98.2 F (36.8 C), resp. rate (!) 25, height 5\' 7"   (1.702 m), weight 91.6 kg, SpO2 98 %.    FiO2 (%):  [40 %-50 %] 50 %   Intake/Output Summary (Last 24 hours) at 08/05/2021 1118 Last data filed at 08/05/2021 0753 Gross per 24 hour  Intake 1861.35 ml  Output 2800 ml  Net -938.65 ml   Filed Weights   07/31/21 0417 08/04/21 0818 08/05/21 0751  Weight: 86.6 kg 97.5 kg 91.6 kg      General: Morbidly obese male in obvious respiratory distress HEENT: MM pink/moist dry oral mucosa.  Currently on 15 L nasal cannula sats 82% transition back to BiPAP Neuro: Confused CV: Heart sounds are distant PULM: On nasal cannula poor excursion of lungs with little or no air movement Vent noninvasive EPAP of 8 IPAP 20 FiO2 increased to 75% tidal volumes running 500-5 20 with a rate of 18 GI: Abdomen distended drain on the right side with yellow fluid Jackson-Pratt left also Extremities: warm/dry, negative edema  Skin: no rashes or lesions    Intake/Output Summary (Last 24 hours) at 08/05/2021 1122 Last data filed at 08/05/2021 0753 Gross per 24 hour  Intake 1861.35 ml  Output 2800 ml  Net -938.65 ml     Resolved Hospital Problem list     Assessment & Plan:   Acute Hypoxic  and Hypercarbic Respiratory Failure  Intermittently tolerating BiPAP but continues to be hypoxic Noted and pul t out out 1000 cc from Lasix given yesterday. Noted to have intermittent difficulties with cognitive ability  Does not seem to be responding to current interventions Transferred intensive care unit now Low threshold for intubation his respiratory mechanics are being impeded by his mechanical dysfunction of his swollen abdomen from liver abscess and altered mental status.       COPD  Weak cough, some respiratory  secretions 11/24 > improved 11/25 -continue Pulmicort and Duonebs   Liver abscess Per surgery Questionable IR to further drain abdomen  Best Practice (right click and "Reselect all SmartList Selections" daily)   Diet/type: TPN DVT  prophylaxis: systemic heparin GI prophylaxis: N/A Lines: Central line and yes and it is still needed Foley:  N/A Code Status:  full code Last date of multidisciplinary goals of care discussion [11/21]  Labs   CBC: Recent Labs  Lab 08/01/21 0300 08/02/21 0347 08/03/21 0304 08/04/21 0223 08/05/21 0449  WBC 13.2* 14.7* 14.5* 14.2* 13.4*  NEUTROABS  --  11.5*  --   --   --   HGB 10.9* 10.8* 9.6* 10.4* 10.0*  HCT 34.0* 34.5* 31.3* 33.1* 33.1*  MCV 96.3 96.9 97.5 99.1 98.8  PLT 239 237 226 251 947    Basic Metabolic Panel: Recent Labs  Lab 08/01/21 0300 08/02/21 0347 08/03/21 0304 08/04/21 0223 08/05/21 0449  NA 143 140 136 138 137  K 3.7 4.1 4.1 4.8 4.2  CL 112* 108 104 105 103  CO2 28 28 27 25 30   GLUCOSE 207* 143* 174* 241* 164*  BUN 18 20 24* 28* 28*  CREATININE 0.68 0.64 0.70 0.72 0.74  CALCIUM 7.4* 7.4* 7.2* 7.6* 7.9*  MG 2.0 2.0 1.8 1.8 1.7  PHOS 2.9 2.9 3.2 3.6 4.0   GFR: Estimated Creatinine Clearance: 90.1 mL/min (by C-G formula based on SCr of 0.74 mg/dL). Recent Labs  Lab 08/02/21 0347 08/03/21 0304 08/04/21 0223 08/05/21 0449  WBC 14.7* 14.5* 14.2* 13.4*    Liver Function Tests: Recent Labs  Lab 07/30/21 0408 07/31/21 0343 08/02/21 0347 08/03/21 0304 08/04/21 0223  AST 24 24 21 21 25   ALT 46* 33 24 19 18   ALKPHOS 121 124 133* 110 114  BILITOT 1.2 1.3* 0.9 0.6 0.5  PROT 5.5* 5.0* 5.1* 4.5* 5.1*  ALBUMIN <1.5* <1.5* <1.5* <1.5* <1.5*   No results for input(s): LIPASE, AMYLASE in the last 168 hours.  No results for input(s): AMMONIA in the last 168 hours.   ABG    Component Value Date/Time   PHART 7.370 08/05/2021 0030   PCO2ART 50.9 (H) 08/05/2021 0030   PO2ART 53.9 (L) 08/05/2021 0030   HCO3 28.8 (H) 08/05/2021 0030   TCO2 27 07/27/2021 0545   ACIDBASEDEF 1.0 07/26/2021 0039   O2SAT 85.4 08/05/2021 0030     Coagulation Profile: Recent Labs  Lab 07/30/21 2012 08/05/21 0449  INR 1.4* 1.2    Cardiac Enzymes: No results  for input(s): CKTOTAL, CKMB, CKMBINDEX, TROPONINI in the last 168 hours.   HbA1C: Hgb A1c MFr Bld  Date/Time Value Ref Range Status  04/18/2021 01:53 PM 12.2 (H) <5.7 % of total Hgb Final    Comment:    For someone without known diabetes, a hemoglobin A1c value of 6.5% or greater indicates that they may have  diabetes and this should be confirmed with a follow-up  test. . For someone with known diabetes, a value <7% indicates  that their diabetes is well controlled and a value  greater than or equal to 7% indicates suboptimal  control. A1c targets should be individualized based on  duration of diabetes, age, comorbid conditions, and  other considerations. . Currently, no consensus exists regarding use of hemoglobin A1c for diagnosis of diabetes for children. Marland Kitchen   12/18/2020 09:53 AM 12.5 (H) <5.7 % of total Hgb Final    Comment:    For someone without known diabetes, a hemoglobin A1c value of  6.5% or greater indicates that they may have  diabetes and this should be confirmed with a follow-up  test. . For someone with known diabetes, a value <7% indicates  that their diabetes is well controlled and a value  greater than or equal to 7% indicates suboptimal  control. A1c targets should be individualized based on  duration of diabetes, age, comorbid conditions, and  other considerations. . Currently, no consensus exists regarding use of hemoglobin A1c for diagnosis of diabetes for children. .     CBG: Recent Labs  Lab 08/04/21 1605 08/04/21 2051 08/05/21 0009 08/05/21 0427 08/05/21 0807  GLUCAP 154* 151* 157* 159* 149*    Critical care time:  35 minutes    Devens Whitten Andreoni ACNP Acute Care Nurse Practitioner [place] Please consult Laurie 08/05/2021, 11:19 AM

## 2021-08-05 NOTE — Progress Notes (Signed)
PHARMACY - TOTAL PARENTERAL NUTRITION CONSULT NOTE   Indication: Prolonged ileus  Patient Measurements: Height: 5\' 7"  (170.2 cm) Weight: 97.5 kg (214 lb 15.2 oz) IBW/kg (Calculated) : 66.1 TPN AdjBW (KG): 73.7 Body mass index is 33.67 kg/m.  Assessment: 5 yom with perforated liver abscess s/p partial omentectomy and drains in the right and left upper quadrants.  Speech evaluated for ability to swallow and deemed moderate aspiration risk with NPO status. KUB on 11/21 with possible ileus vs obstructive with worsening on 11/22 imaging. CCM wanted to hold off on TPN start 11/22 and push bowel regimen - BM documented but not significant, smear only per CCM. Pharmacy consulted to initiate TPN 11/23.  Glucose / Insulin: hx uncontrolled DM2. A1c 12.2% (PTA regimen: Lantus 58 units QHS + Novolog 18 units qAC + metformin but unclear compliance). CBGs 151-164 since new TPN hung with 40 units insulin in bag, also on insulin glargine 10 units BID + 18 units moderate SSI required in past 24 hours Electrolytes: K 4.2 - down from 4.8 (goal >/=4 with ileus), Mg 1.7 (goal >/=2 with ileus), CoCa ~9.9, Phos 4.0, others WNL Renal: SCr stable wnl. BUN WNL.  Hepatic: ALT/AST WNL. Tbili stable. Prealbumin <5, albumin <1.5, TG 75  Intake / Output; MIVF: NGT unable to be placed so far. Drain output up at 515 mL/24hrs, LBM 11/27 (4 BMs after enema), UOP up at 1.1 mL/kg/hr.   GI Imaging: 11/23 [place] and MR of abdomen - interval worsening of small bowel dilation suggesting ileus or partial obstruction GI Surgeries / Procedures: none since consult for TPN   Central access: PICC planned 11/23 TPN start date: 11/23  Nutritional Goals: Goal TPN rate is 90 mL/hr (provides 125 g of protein and 2200 kcals per day)   RD Assessment: Estimated Needs Total Energy Estimated Needs: 2200-2400 Total Protein Estimated Needs: 125-145 grams Total Fluid Estimated Needs: >2L  Current Nutrition:  Clear liquids - tolerating small  sips, not yet advancing  Plan:  - Continue TPN at 90 mL/hr at 1800 - TPN will provide 125 g protein and 2200 kCal, meeting 100% of protein and kCal needs - Electrolytes in TPN: Continue Cl:Ac ratio 1:2, Na 50 mEq/L, K 40 mEq/L, Ca 5 mEq/L, Mg 7 mEq/L, and Phos 15 mmol/L. Cl:Ac 1:2 - Add standard MVI and trace elements to TPN.  - Add Pepcid 40mg  to TPN (d/c current IV order) - CBGs improved; continue Glargine at 10 units BID, Moderate q4h SSI and 40 units regular insulin in TPN bag (added to account for TPN dextrose content). F/u CBGs and adjust as needed.  - Monitor TPN labs on Mon/Thurs - F/u resolution of ileus, ability to tolerate PO   Thank you for allowing pharmacy to be a part of this patient's care.  Ardyth Harps, PharmD Clinical Pharmacist

## 2021-08-05 NOTE — Assessment & Plan Note (Signed)
No wheezing.   -Continue Budesonide - Consult CCM

## 2021-08-05 NOTE — Progress Notes (Signed)
IR was requested for image guided hepatic abscess aspiration and possible drain placement.   Case was reviewed by Dr. Maryelizabeth Kaufmann and Dr. Dwaine Gale.  Recommended  CT AP with contrast before we can proceed with the aspiration/drain placement as [place] and MRI did not show definitive target for aspiration and drain placement.   CT AP W has been ordered, but patient too unstable to come down.   The procedure is on HOLD until CT is done and reviewed by an interventional radiologist.    Please call IR for questions and concerns.   Armando Gang Danny Zimny PA-C 08/05/2021 12:04 PM

## 2021-08-05 NOTE — Progress Notes (Signed)
Physical Therapy Treatment Patient Details Name: Matthew Dominguez MRN: [id number] DOB: 17-Aug-1951 Today's Date: 08/05/2021   History of Present Illness 70 yo admitted 11/18 after found down at home with AMS. Pt with sepsis due to ruptured liver abscess s/p ex lap with drainage same date. Pt intubated 11/18-11/19.  11/29 with respiratory failure, now on/off Bipap.  PMhx: DM, HTN, COPD, mild cognitive impairment    PT Comments    Patient seen briefly at bedside with OT for EOB activity.  Saturating in 80's on 14L HFNC upon PT/OT entry and dangling feet off bed with pt slide down, assisted to sitting position to aide in oxygenation with increase in SpO2 to 90's.  RN in room and pulmonology NP in during session.  Patient needing full support for EOB about 6 minutes.  Complaining of fatigue and unable to answer orientation questions.  Assisted to supine and positioned for improved upright posture for oxygenation.  Noted after session patient transferred to ICU.  PT will follow up for appropriateness.     Recommendations for follow up therapy are one component of a multi-disciplinary discharge planning process, led by the attending physician.  Recommendations may be updated based on patient status, additional functional criteria and insurance authorization.  Follow Up Recommendations  Skilled nursing-short term rehab (<3 hours/day)     Assistance Recommended at Discharge Frequent or [facility] bed;Wheelchair (measurements PT);Wheelchair cushion (measurements PT);Other (comment)    Recommendations for Other Services       Precautions / Restrictions Precautions Precautions: Fall Precaution Comments: watch sats, Restrictions Weight Bearing Restrictions: No     Mobility  Bed Mobility Overal bed mobility: Needs Assistance Bed Mobility: Sidelying to Sit;Rolling;Sit to Supine Rolling: Max assist;+2 for physical assistance   Supine to  sit: Max assist;+2 for physical assistance;HOB elevated Sit to supine: Max assist;+2 for physical assistance;HOB elevated   General bed mobility comments: Patient already with legs off bed and slid down in bed as restless and dyspneic; assist for lifting trunk and pt not helping despite reset and cues. Sit to supine A for trunk and legs and to scoot up in bed.    Transfers                   General transfer comment: NT due to high O2 requirement and poor oxygenation    Ambulation/Gait                   Stairs             Wheelchair Mobility    Modified Rankin (Stroke Patients Only)       Balance Overall balance assessment: Needs assistance   Sitting balance-Leahy Scale: Zero Sitting balance - Comments: requires 2 person assist to remain upright                                    Cognition Arousal/Alertness: Awake/alert Behavior During Therapy: Restless Overall Cognitive Status: Impaired/Different from baseline Area of Impairment: Orientation;Attention;Following commands;Safety/judgement;Problem solving                 Orientation Level: Disoriented to;Place;Situation;Time Current Attention Level: Sustained Memory: Decreased short-term memory Following Commands: Follows one step commands with increased time;Follows one step commands inconsistently Safety/Judgement: Decreased awareness of deficits   Problem Solving: Slow processing;Decreased initiation;Difficulty sequencing;Requires verbal cues;Requires tactile cues          Exercises  General Comments General comments (skin integrity, edema, etc.): on 14-15L HFNC O2, RN reports did better earlier on Bipap, but worse on O2.  Pulmonary NP in to assess patient and reports pt not moving air well.      Pertinent Vitals/Pain Pain Assessment: No/denies pain    Home Living                          Prior Function            PT Goals (current goals can now  be found in the care plan section) Progress towards PT goals: Not progressing toward goals - comment    Frequency    Min 2X/week      PT Plan Current plan remains appropriate    Co-evaluation PT/OT/SLP Co-Evaluation/Treatment: Yes Reason for Co-Treatment: Necessary to address cognition/behavior during functional activity;For patient/therapist safety;To address functional/ADL transfers;Complexity of the patient's impairments (multi-system involvement) PT goals addressed during session: Mobility/safety with mobility;Balance OT goals addressed during session: ADL's and self-care;Strengthening/ROM      AM-PAC PT "6 Clicks" Mobility   Outcome Measure  Help needed turning from your back to your side while in a flat bed without using bedrails?: Total Help needed moving from lying on your back to sitting on the side of a flat bed without using bedrails?: Total Help needed moving to and from a bed to a chair (including a wheelchair)?: Total Help needed standing up from a chair using your arms (e.g., wheelchair or bedside chair)?: Total Help needed to walk in hospital room?: Total Help needed climbing 3-5 steps with a railing? : Total 6 Click Score: 6    End of Session Equipment Utilized During Treatment: Oxygen Activity Tolerance: Treatment limited secondary to medical complications (Comment) (hypoxia) Patient left: in bed;with call bell/phone within reach;with nursing/sitter in room   PT Visit Diagnosis: Other abnormalities of gait and mobility (R26.89);Muscle weakness (generalized) (M62.81)     Time: 7510-2585 PT Time Calculation (min) (ACUTE ONLY): 12 min  Charges:                        Magda Kiel, PT Acute Rehabilitation Services IDPOE:[phone] Office:[phone] 08/05/2021    Reginia Naas 08/05/2021, 1:13 PM

## 2021-08-05 NOTE — Assessment & Plan Note (Signed)
-  Continue Flomax -Continue Foley, inserted 11/28 due to retention, diuresis and delirium

## 2021-08-05 NOTE — Assessment & Plan Note (Signed)
This was the cause of sepsis - Continue Rocephin and Flagyl - Consult ID, GI, IR, Gen Surg appreciate cares

## 2021-08-05 NOTE — Progress Notes (Signed)
Pt keeps ripping off BIPAP. Pt forcefully ripped off about 0000 and RT called. Pt pulled mask apart. HIFLO @15  is applied back to pt. Pt is satting in mid 90s w/o distress. Will continue to monitor. Pt given education on importance of wearing BIPAP.

## 2021-08-05 NOTE — Progress Notes (Signed)
Changes made based on ABG result

## 2021-08-05 NOTE — Progress Notes (Signed)
Patient placed back on bipap due to increased work of breathing and sats of 89% on 15L [place] high flow.  Will continue to monitor.

## 2021-08-06 ENCOUNTER — Inpatient Hospital Stay (HOSPITAL_COMMUNITY): Payer: Medicare HMO

## 2021-08-06 DIAGNOSIS — J432 Centrilobular emphysema: Secondary | ICD-10-CM | POA: Diagnosis not present

## 2021-08-06 DIAGNOSIS — G9341 Metabolic encephalopathy: Secondary | ICD-10-CM | POA: Diagnosis not present

## 2021-08-06 DIAGNOSIS — R6520 Severe sepsis without septic shock: Secondary | ICD-10-CM | POA: Diagnosis not present

## 2021-08-06 DIAGNOSIS — A419 Sepsis, unspecified organism: Secondary | ICD-10-CM | POA: Diagnosis not present

## 2021-08-06 LAB — MAGNESIUM
Magnesium: 1.8 mg/dL (ref 1.7–2.4)
Magnesium: 1.8 mg/dL (ref 1.7–2.4)

## 2021-08-06 LAB — BASIC METABOLIC PANEL
Anion gap: 4 — ABNORMAL LOW (ref 5–15)
Anion gap: 3 — ABNORMAL LOW (ref 5–15)
BUN: 23 mg/dL (ref 8–23)
BUN: 22 mg/dL (ref 8–23)
CO2: 34 mmol/L — ABNORMAL HIGH (ref 22–32)
CO2: 34 mmol/L — ABNORMAL HIGH (ref 22–32)
Calcium: 8 mg/dL — ABNORMAL LOW (ref 8.9–10.3)
Calcium: 7.9 mg/dL — ABNORMAL LOW (ref 8.9–10.3)
Chloride: 99 mmol/L (ref 98–111)
Chloride: 98 mmol/L (ref 98–111)
Creatinine, Ser: 0.58 mg/dL — ABNORMAL LOW (ref 0.61–1.24)
Creatinine, Ser: 0.53 mg/dL — ABNORMAL LOW (ref 0.61–1.24)
GFR, Estimated: >60 mL/min (ref >60)
GFR, Estimated: >60 mL/min (ref >60)
Glucose, Bld: 150 mg/dL — ABNORMAL HIGH (ref 70–99)
Glucose, Bld: 143 mg/dL — ABNORMAL HIGH (ref 70–99)
Potassium: 4.1 mmol/L (ref 3.5–5.1)
Potassium: 3.9 mmol/L (ref 3.5–5.1)
Sodium: 137 mmol/L (ref 135–145)
Sodium: 135 mmol/L (ref 135–145)

## 2021-08-06 LAB — CBC
HCT: 32.8 % — ABNORMAL LOW (ref 39.0–52.0)
Hemoglobin: 10.3 g/dL — ABNORMAL LOW (ref 13.0–17.0)
MCH: 31.1 pg (ref 26.0–34.0)
MCHC: 31.4 g/dL (ref 30.0–36.0)
MCV: 99.1 fL (ref 80.0–100.0)
Platelets: 264 10*3/uL (ref 150–400)
RBC: 3.31 MIL/uL — ABNORMAL LOW (ref 4.22–5.81)
RDW: 14.8 % (ref 11.5–15.5)
WBC: 12.2 10*3/uL — ABNORMAL HIGH (ref 4.0–10.5)
nRBC: 0 % (ref 0.0–0.2)

## 2021-08-06 LAB — GLUCOSE, CAPILLARY
Glucose-Capillary: 153 mg/dL — ABNORMAL HIGH (ref 70–99)
Glucose-Capillary: 124 mg/dL — ABNORMAL HIGH (ref 70–99)
Glucose-Capillary: 144 mg/dL — ABNORMAL HIGH (ref 70–99)
Glucose-Capillary: 149 mg/dL — ABNORMAL HIGH (ref 70–99)
Glucose-Capillary: 152 mg/dL — ABNORMAL HIGH (ref 70–99)
Glucose-Capillary: 158 mg/dL — ABNORMAL HIGH (ref 70–99)

## 2021-08-06 LAB — POCT I-STAT 7, (LYTES, BLD GAS, ICA,H+H)
Acid-Base Excess: 12 mmol/L — ABNORMAL HIGH (ref 0.0–2.0)
Bicarbonate: 36.3 mmol/L — ABNORMAL HIGH (ref 20.0–28.0)
Calcium, Ion: 1.11 mmol/L — ABNORMAL LOW (ref 1.15–1.40)
HCT: 28 % — ABNORMAL LOW (ref 39.0–52.0)
Hemoglobin: 9.5 g/dL — ABNORMAL LOW (ref 13.0–17.0)
O2 Saturation: 90 %
Patient temperature: 99.2
Potassium: 4.1 mmol/L (ref 3.5–5.1)
Sodium: 134 mmol/L — ABNORMAL LOW (ref 135–145)
TCO2: 38 mmol/L — ABNORMAL HIGH (ref 22–32)
pCO2 arterial: 46.1 mmHg (ref 32.0–48.0)
pH, Arterial: 7.506 — ABNORMAL HIGH (ref 7.350–7.450)
pO2, Arterial: 55 mmHg — ABNORMAL LOW (ref 83.0–108.0)

## 2021-08-06 LAB — HEPARIN LEVEL (UNFRACTIONATED): Heparin Unfractionated: 0.46 IU/mL (ref 0.30–0.70)

## 2021-08-06 LAB — PHOSPHORUS: Phosphorus: 4 mg/dL (ref 2.5–4.6)

## 2021-08-06 MED ORDER — MAGNESIUM SULFATE 4 GM/100ML IV SOLN
4.0000 g | Freq: Once | INTRAVENOUS | Status: AC
  Administered 2021-08-06: 4 g via INTRAVENOUS
  Filled 2021-08-06 (×2): qty 100

## 2021-08-06 MED ORDER — STERILE WATER FOR INJECTION IJ SOLN
INTRAMUSCULAR | Status: AC
  Filled 2021-08-06: qty 10

## 2021-08-06 MED ORDER — DOXAZOSIN MESYLATE 1 MG PO TABS
1.0000 mg | ORAL_TABLET | Freq: Every day | ORAL | Status: DC
  Administered 2021-08-07 – 2021-08-14 (×8): 1 mg via ORAL
  Filled 2021-08-06 (×8): qty 1

## 2021-08-06 MED ORDER — DOXAZOSIN MESYLATE 1 MG PO TABS
1.0000 mg | ORAL_TABLET | Freq: Every day | ORAL | Status: DC
  Administered 2021-08-06: 1 mg
  Filled 2021-08-06: qty 1

## 2021-08-06 MED ORDER — OLANZAPINE 10 MG IM SOLR
5.0000 mg | Freq: Once | INTRAMUSCULAR | Status: AC | PRN
  Administered 2021-08-06: 5 mg via INTRAMUSCULAR
  Filled 2021-08-06: qty 10

## 2021-08-06 MED ORDER — TRAVASOL 10 % IV SOLN
INTRAVENOUS | Status: AC
  Filled 2021-08-06: qty 1252.8

## 2021-08-06 MED ORDER — IOHEXOL 350 MG/ML SOLN
100.0000 mL | Freq: Once | INTRAVENOUS | Status: AC | PRN
  Administered 2021-08-06: 100 mL via INTRAVENOUS

## 2021-08-06 MED ORDER — ACETAZOLAMIDE 250 MG PO TABS
500.0000 mg | ORAL_TABLET | Freq: Once | ORAL | Status: AC
  Administered 2021-08-06: 500 mg via ORAL
  Filled 2021-08-06 (×2): qty 2

## 2021-08-06 MED ORDER — LORAZEPAM 2 MG/ML IJ SOLN
1.0000 mg | INTRAMUSCULAR | Status: AC | PRN
  Administered 2021-08-09 – 2021-08-10 (×2): 1 mg via INTRAVENOUS
  Filled 2021-08-06 (×3): qty 1

## 2021-08-06 NOTE — Progress Notes (Signed)
[facility]  Date of Admission:  07/25/2021   Total days of inpatient antibiotics 4  Principal Problem:   Severe sepsis (HCC) Active Problems:   Diabetes mellitus type 2, controlled ([place])   Essential hypertension, benign   COPD (chronic obstructive pulmonary disease) (HCC)   Mild cognitive impairment   Liver abscess   Malnutrition of moderate degree   Portal vein thrombosis   Hypertensive urgency   Ileus, postoperative (HCC)   Acute metabolic encephalopathy   Acute hypercapnic respiratory failure (HCC)   Fever   Acute urinary retention          Assessment: 70 year old male with diabetes poorly controlled, COPD, mild cognitive impairment admitted for pneumoperitoneum secondary to ruptured liver abscess.  CT showed pneumoperitoneum, cholelithiasis, fatty liver.  On arrival LFTs elevated with a T bili of 3.5.  ID consult for antibiotic recommendations.   #Pneumoperitoneum 2/2 ruptured pyogenic liver abscess as a complication of suspected pyelophlebitis SP Ex lap on 11/18 # TPN #Uncontrolled DM A1c 12.5 on 12/18/20 #Chronic fatty liver disease, NASH: LFTs consistently elevated since 2017 #Remote Hx of IVDA(last use 1974) -MRCP showed portal vein thrombosis  and multiple liver abscesses, mild intrahepatic biliary ductal distension. GI consulted. Plan on colonoscopy in the future to evaluate for colon malignancy. Pt is on antibiotics+heparin gtt for possible pyelophlebitis.  -Pt continue to have leukocytosis/fever despite being on IV antibiotics. May be a source control issue. IR consulted and recommend CT AP for aspiration. Surgery following, no plans for OR. Pt was started on TPN which carries increase risk of infection, Blood Cx pending   #Acute respiratory failure 2/2 volume overload -Continue diureses  Recommendations:  -Continue ceftriaxone and metronidazole -Follow blood Cx -Follow-up cultures when liver abscess aspirated. IR recommends  CT AP w/contrast when pt more stable -Anticipate atleast 4 weeks of antibiotics - HBV re-immunization on discharge   Microbiology:   Antibiotics: Cefepime 11/18-21 metronidazole 11/18-p Ceftriaxone 11/22-p  Cultures: Blood Cx: 11/19 NGTD, 11/29 Urine Cx: 11/29      SUBJECTIVE: Pt is on HFNC. He is resting in bed. No new complaints.   Interval: Tmax of 100.2 overnight. Wbc 12.2K Review of Systems: Review of Systems  All other systems reviewed and are negative.   Scheduled Meds:  amLODipine  10 mg Oral Daily   budesonide (PULMICORT) nebulizer solution  0.5 mg Nebulization BID   Chlorhexidine Gluconate Cloth  6 each Topical Daily   collagenase   Topical Daily   docusate sodium  100 mg Oral BID   doxazosin  1 mg Per Tube Daily   furosemide  40 mg Intravenous BID   guaiFENesin  15 mL Oral Q4H   insulin aspart  0-15 Units Subcutaneous Q4H   insulin glargine-yfgn  10 Units Subcutaneous BID   ipratropium-albuterol  3 mL Nebulization BID   mouth rinse  15 mL Mouth Rinse BID   metoprolol tartrate  10 mg Intravenous Q6H   QUEtiapine  25 mg Oral QHS   sodium chloride flush  10-40 mL Intracatheter Q12H   sodium chloride flush  10-40 mL Intracatheter Q12H   Continuous Infusions:  sodium chloride 10 mL/hr at 08/06/21 0800   cefTRIAXone (ROCEPHIN)  IV Stopped (08/05/21 1353)   heparin 2,450 Units/hr (08/06/21 1000)   magnesium sulfate bolus IVPB     metronidazole Stopped (08/06/21 3875)   TPN ADULT (ION) 90 mL/hr at 08/06/21 1000   TPN ADULT (ION)     PRN  Meds:.sodium chloride, acetaminophen, albuterol, docusate, fentaNYL (SUBLIMAZE) injection, hydrALAZINE, influenza vaccine adjuvanted, labetalol, LORazepam, ondansetron (ZOFRAN) IV, polyethylene glycol No Known Allergies  OBJECTIVE: Vitals:   08/06/21 0900 08/06/21 0930 08/06/21 1000 08/06/21 1018  BP: (!) 162/138 128/77 (!) 148/55   Pulse: (!) 106 (!) 101 93 97  Resp: 18 (!) 23 20 (!) 29  Temp:      TempSrc:       SpO2: 94% 96% 95% 94%  Weight:      Height:       Body mass index is 31.64 kg/m.  Physical Exam Constitutional:      General: He is not in acute distress.    Appearance: He is normal weight. He is not toxic-appearing.  HENT:     Head: Normocephalic and atraumatic.     Right Ear: External ear normal.     Left Ear: External ear normal.     Nose: No congestion or rhinorrhea.     Mouth/Throat:     Mouth: Mucous membranes are moist.     Pharynx: Oropharynx is clear.  Eyes:     Extraocular Movements: Extraocular movements intact.     Conjunctiva/sclera: Conjunctivae normal.     Pupils: Pupils are equal, round, and reactive to light.  Cardiovascular:     Rate and Rhythm: Normal rate and regular rhythm.     Heart sounds: No murmur heard.   No friction rub. No gallop.  Pulmonary:     Effort: Pulmonary effort is normal.     Breath sounds: Normal breath sounds.  Abdominal:     Palpations: Abdomen is soft.     Comments: Distended, tender  Musculoskeletal:        General: No swelling. Normal range of motion.     Cervical back: Normal range of motion and neck supple.  Skin:    General: Skin is warm and dry.  Neurological:     General: No focal deficit present.     Mental Status: He is oriented to person, place, and time.  Psychiatric:        Mood and Affect: Mood normal.      Lab Results Lab Results  Component Value Date   WBC 12.2 (H) 08/06/2021   HGB 10.3 (L) 08/06/2021   HCT 32.8 (L) 08/06/2021   MCV 99.1 08/06/2021   PLT 264 08/06/2021    Lab Results  Component Value Date   CREATININE 0.53 (L) 08/06/2021   BUN 22 08/06/2021   NA 135 08/06/2021   K 3.9 08/06/2021   CL 98 08/06/2021   CO2 34 (H) 08/06/2021    Lab Results  Component Value Date   ALT 18 08/04/2021   AST 25 08/04/2021   ALKPHOS 114 08/04/2021   BILITOT 0.5 08/04/2021        Laurice Record, [facility] 08/06/2021, 10:53 AM
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
## 2021-08-06 NOTE — Progress Notes (Signed)
Pt removed from BIPAP and placed on HHFNC 25L/60%. Pt is tolerating well at this time. RN at bedside.

## 2021-08-06 NOTE — Plan of Care (Signed)
  Problem: Education: Goal: Knowledge of General Education information will improve Description: Including pain rating scale, medication(s)/side effects and non-pharmacologic comfort measures Outcome: Progressing   Problem: Health Behavior/Discharge Planning: Goal: Ability to manage health-related needs will improve Outcome: Progressing   Problem: Clinical Measurements: Goal: Ability to maintain clinical measurements within normal limits will improve Outcome: Progressing Goal: Will remain free from infection Outcome: Progressing Goal: Diagnostic test results will improve Outcome: Progressing Goal: Respiratory complications will improve Outcome: Progressing Goal: Cardiovascular complication will be avoided Outcome: Progressing   Problem: Activity: Goal: Risk for activity intolerance will decrease Outcome: Progressing   Problem: Nutrition: Goal: Adequate nutrition will be maintained Outcome: Progressing   Problem: Coping: Goal: Level of anxiety will decrease Outcome: Progressing   Problem: Elimination: Goal: Will not experience complications related to bowel motility Outcome: Progressing Goal: Will not experience complications related to urinary retention Outcome: Progressing   Problem: Pain Managment: Goal: General experience of comfort will improve Outcome: Progressing   Problem: Safety: Goal: Ability to remain free from injury will improve Outcome: Progressing   Problem: Skin Integrity: Goal: Risk for impaired skin integrity will decrease Outcome: Progressing   Problem: Activity: Goal: Ability to tolerate increased activity will improve Outcome: Progressing   Problem: Respiratory: Goal: Ability to maintain a clear airway and adequate ventilation will improve Outcome: Progressing   

## 2021-08-06 NOTE — Progress Notes (Signed)
Nutrition Follow-up  DOCUMENTATION CODES:   Non-severe (moderate) malnutrition in context of chronic illness  INTERVENTION:   Continue TPN per Pharmacy to meet 100% of nutrition needs.  NUTRITION DIAGNOSIS:   Moderate Malnutrition related to chronic illness as evidenced by mild fat depletion, moderate fat depletion, mild muscle depletion, moderate muscle depletion.  Ongoing  GOAL:   Patient will meet greater than or equal to 90% of their needs  Met with TPN  MONITOR:   Labs, Weight trends, I & O's, Skin, Diet advancement  REASON FOR ASSESSMENT:   Consult Enteral/tube feeding initiation and management (surgery ok'd trickles)  ASSESSMENT:   Pt admitted with sepsis and hepatitis 2/2 ruptured pyogenic liver abscesses. PMH includes HTN, HLD, DM, COPD, hypothyroidism, mild cognitive impairment.  SLP following. Plan for FEES tomorrow if respiratory status allows.  Discussed patient in ICU rounds and with RN today. Patient was on BiPAP this morning. Currently on HFNC. CT abdomen has been ordered to see if liver abscess needs to be drained.   TPN at 90 ml/h is providing 2160 ml, 2215 kcal, 125 gm protein daily.  Labs reviewed.  CBG: 124-144  Medications reviewed and include Lasix, Novolog, Semglee, Flagyl, IV Rocephin.  Admission weight 84 kg Current weight 91.6 kg  I/O +13 L since admission Drain output 100 ml x 24 hours  Diet Order:   Diet Order             Diet NPO time specified Except for: Ice Chips, Other (See Comments)  Diet effective midnight                   EDUCATION NEEDS:   No education needs have been identified at this time  Skin:  Skin Assessment: Skin Integrity Issues: Skin Integrity Issues:: Unstageable, DTI, Incisions DTI: coccyx Unstageable: bilateral knees and elbows Incisions: abdomen  Last BM:  11/30 type 6  Height:   Ht Readings from Last 1 Encounters:  07/30/21 _0  (1.702 m)    Weight:   Wt Readings from Last 1  Encounters:  08/05/21 91.6 kg    BMI:  Body mass index is 31.64 kg/m.  Estimated Nutritional Needs:   Kcal:  2200-2400  Protein:  125-145 grams  Fluid:  >2L    Lucas Mallow, RD, LDN, CNSC Please refer to Amion for contact information.

## 2021-08-06 NOTE — Progress Notes (Signed)
ANTICOAGULATION CONSULT NOTE  Pharmacy Consult for Heparin Indication: Portal Vein Thrombosis   No Known Allergies  Patient Measurements: Height: 5\' 7"  (170.2 cm) Weight: 91.6 kg (202 lb) IBW/kg (Calculated) : 66.1 Heparin Dosing Weight: 73 kg   Vital Signs: Temp: 98.8 F (37.1 C) (11/30 0708) Temp Source: Axillary (11/30 0708) BP: 155/67 (11/30 0830) Pulse Rate: 102 (11/30 0830)  Labs: Recent Labs    08/04/21 0223 08/05/21 0449 08/05/21 1534 08/05/21 2147 08/05/21 2336 08/06/21 0000 08/06/21 0500 08/06/21 0804  HGB 10.4* 10.0* 10.5*  --  10.2*  --   --  10.3*  HCT 33.1* 33.1* 31.0*  --  30.0*  --   --  32.8*  PLT 251 268  --   --   --   --   --  264  LABPROT  --  15.3*  --   --   --   --   --   --   INR  --  1.2  --   --   --   --   --   --   HEPARINUNFRC 0.42 0.45  --  0.34  --   --  0.46  --   CREATININE 0.72 0.74  --   --   --  0.58* 0.53*  --      Estimated Creatinine Clearance: 90.1 mL/min (A) (by C-G formula based on SCr of 0.53 mg/dL (L)).   Assessment: 70 yo male presented on 07/25/2021 after being found down with AMS found to have perforated liver abscess s/p partial omentectomy and drain placement. MRI with suspected portal vein thrombosis. Results of MRI dicussed with CCM and surgery. Pharmacy consulted to dose heparin.   Heparin level therapeutic at 0.46 on infusion at 2450 units/hr.Hgb stable 10s and plts are WNL. No bleeding or IV issues noted.   Goal of Therapy:  Heparin level 0.3-0.7 units/ml Monitor platelets by anticoagulation protocol: Yes   Plan:  Continue heparin infusion at 2450 units/hr  Daily heparin level    Varney Daily, PharmD PGY1 Pharmacy Resident  Please check AMION for all [facility] pharmacy phone numbers After 10:00 PM call main pharmacy [phone]

## 2021-08-06 NOTE — Progress Notes (Signed)
[facility] [place] Progress Note Patient Name: Matthew Dominguez DOB: November 21, 1950 MRN: [id number]   Date of Service  08/06/2021  HPI/Events of Note  Pt is NPO, therefore can't get usual dose of PO seroquel  eICU Interventions  Ordered PRN IM zyprexa      Intervention Category Minor Interventions: Agitation / anxiety - evaluation and management  Tilden Dome 08/06/2021, 12:33 AM

## 2021-08-06 NOTE — Progress Notes (Signed)
Speech Language Pathology Treatment: Dysphagia  Patient Details Name: HANEEF HALLQUIST MRN: [id number] DOB: 06-16-1951 Today's Date: 08/06/2021 Time: 0850-0908 SLP Time Calculation (min) (ACUTE ONLY): 18 min  Assessment / Plan / Recommendation Clinical Impression  Pt now in ICU on HHFNC 60% FiO2 at 25 Lpm. RR around 20. Pt on an off BiPAP, likely to go back on later today. Pts oral hygiene has continued to deteriorate given NPO status and O2 tx. At baseline he is dysphonic and congested. However, after several ice chips pt had a productive cough and vocal quality cleared. Pt then tolerated further ice, one sip of water and several bites of puree without worsening. He does have increased risk of aspiration at this time given impaired breathing and swallowing pattern (deep inspiration immediate after swallow) and high flow oxygen. Pt can be given ice chips to keep oral mucosa healthy when not on BiPAP and can also take meds in puree orally. He is still on TPN. SLP will plan FEES tomorrow if respiratory function is stable. He can no longer travel to [facility] on HHFNC.   HPI HPI: Pt is a 70 y.o. male who was brought to the ED after being found down at home by EMS. CT head negative. Pt s/p OR for exploratory laparotomy and drainage of pyogenic liver abscesses (2) and partial omentectomy August 11, 2023. Admitted to the ICU postoperatively 08-11-23 with sepsis and hepatitis secondary to ruptured pyogenic liver abscesses. [facility] passed on August 11, 2023 & 2023-08-13. ETT 08-10-2010/19. Pt was initally seen by SLP 11/21 and recommended to be NPO except ice chips due to mentation and s/s of possible aspiration. Therapy was then put on hold due to concern for ileus. PMH: HTN, HLD, DM, COPD, hypothyroidism, mild cognitive impairment.      SLP Plan  Other (Comment) (FEES)      Recommendations for follow up therapy are one component of a multi-disciplinary discharge planning process, led by the attending physician.  Recommendations may be updated  based on patient status, additional functional criteria and insurance authorization.    Recommendations  Liquids provided via: Cup;Straw Medication Administration: Whole meds with puree Supervision: Staff to assist with self feeding Compensations: Slow rate;Small sips/bites;Minimize environmental distractions;Other (Comment) Postural Changes and/or Swallow Maneuvers: Seated upright 90 degrees                Oral Care Recommendations: Oral care QID Follow Up Recommendations: No SLP follow up Plan: Other (Comment) (FEES)       GO               Herbie Baltimore, MA CCC-SLP  Acute Rehabilitation Services Office [phone]  Lynann Beaver  08/06/2021, 9:13 AM

## 2021-08-06 NOTE — Progress Notes (Signed)
12 Days Post-Op  Subjective: CC: On bipap this morning. Some abdominal pain.   Objective: Vital signs in last 24 hours: Temp:  [97.7 F (36.5 C)-100.2 F (37.9 C)] 98.8 F (37.1 C) (11/30 0708) Pulse Rate:  [75-108] 104 (11/30 0433) Resp:  [12-30] 21 (11/30 0433) BP: (118-164)/(45-83) 134/59 (11/30 0433) SpO2:  [89 %-100 %] 98 % (11/30 0433) FiO2 (%):  [40 %-60 %] 40 % (11/30 0433) Weight:  [91.6 kg] 91.6 kg (11/29 0751) Last BM Date: 08/05/21  Intake/Output from previous day: 11/29 0701 - 11/30 0700 In: 3057.4 [I.V.:2803.1; IV Piggyback:254.3] Out: 0092 [Urine:3750; Drains:100] Intake/Output this shift: No intake/output data recorded.  PE: Gen:  Awake and alert, on BIPAP Abd: Distended but soft, some epigastric and ruq tenderness. +bowel sounds. Midline wound clean and without evidence of dehisence. RUQ drain with SS fluid in gravity bag without gross bile. LUQ drain with SS fluid in bulb without gross bile  Psych: A&Ox2  Lab Results:  Recent Labs    08/04/21 0223 08/05/21 0449 08/05/21 1534 08/05/21 2336  WBC 14.2* 13.4*  --   --   HGB 10.4* 10.0* 10.5* 10.2*  HCT 33.1* 33.1* 31.0* 30.0*  PLT 251 268  --   --     BMET Recent Labs    08/06/21 0000 08/06/21 0500  NA 137 135  K 4.1 3.9  CL 99 98  CO2 34* 34*  GLUCOSE 150* 143*  BUN 23 22  CREATININE 0.58* 0.53*  CALCIUM 8.0* 7.9*    PT/INR Recent Labs    08/05/21 0449  LABPROT 15.3*  INR 1.2    CMP     Component Value Date/Time   NA 135 08/06/2021 0500   NA 142 01/15/2016 1031   K 3.9 08/06/2021 0500   CL 98 08/06/2021 0500   CO2 34 (H) 08/06/2021 0500   GLUCOSE 143 (H) 08/06/2021 0500   BUN 22 08/06/2021 0500   BUN 10 01/15/2016 1031   CREATININE 0.53 (L) 08/06/2021 0500   CREATININE 0.74 04/18/2021 1353   CALCIUM 7.9 (L) 08/06/2021 0500   PROT 5.1 (L) 08/04/2021 0223   PROT 6.2 01/15/2016 1031   ALBUMIN <1.5 (L) 08/04/2021 0223   ALBUMIN 4.1 01/15/2016 1031   AST 25  08/04/2021 0223   ALT 18 08/04/2021 0223   ALKPHOS 114 08/04/2021 0223   BILITOT 0.5 08/04/2021 0223   BILITOT 0.3 01/15/2016 1031   GFRNONAA >60 08/06/2021 0500   GFRNONAA 91 02/04/2021 0959   GFRAA 106 02/04/2021 0959   Lipase     Component Value Date/Time   LIPASE 36 07/27/2021 0420    Studies/Results: DG CHEST PORT 1 VIEW  Result Date: 08/05/2021 CLINICAL DATA:  Hypoxia EXAM: PORTABLE CHEST 1 VIEW COMPARISON:  Chest radiograph 1 day prior FINDINGS: A right upper extremity PICC is in stable position terminating at the cavoatrial junction. The cardiac silhouette is enlarged, unchanged. The upper mediastinal contours are stable. Patchy opacities projecting over the right midlung have significantly improved. There remains left basilar/retrocardiac consolidation, not significantly changed. There is a probable trace left pleural effusion. There is no significant right pleural effusion. There is no pneumothorax. There is no acute osseous abnormality. IMPRESSION: Patchy opacities in the right midlung have significantly improved. Left basilar/retrocardiac consolidation is unchanged, and there is a probable trace left pleural effusion. Electronically Signed   By: Valetta Mole M.D.   On: 08/05/2021 10:13   DG CHEST PORT 1 VIEW  Result Date: 08/04/2021 CLINICAL DATA:  Hypoxia. EXAM: PORTABLE CHEST 1 VIEW COMPARISON:  Chest radiograph dated 07/29/2021. FINDINGS: Right-sided PICC with tip at the cavoatrial junction. Shallow inspiration. Left lung base and right mid lung field densities may represent atelectasis or infiltrate. No large pleural effusion. No pneumothorax. Stable cardiomegaly. No acute osseous pathology. Lower cervical ACDF. IMPRESSION: 1. Left lung base and right mid lung field densities may represent atelectasis or infiltrate. 2. Stable cardiomegaly. Electronically Signed   By: Anner Crete M.D.   On: 08/04/2021 19:44   US Abdomen Limited RUQ (LIVER/GB)  Result Date:  08/04/2021 CLINICAL DATA:  Liver abscesses. EXAM: ULTRASOUND ABDOMEN LIMITED RIGHT UPPER QUADRANT COMPARISON:  MRCP, dated July 30, 2021 FINDINGS: Gallbladder: A 2.4 cm gallstone is seen within the dependent portion of the gallbladder lumen. The gallbladder wall measures 7.2 mm in thickness. No sonographic Murphy sign noted by sonographer. Common bile duct: Diameter: 5.8 mm Liver: A 0.9 cm x 0.6 cm x 1.0 cm complex hypoechoic lesion is seen within the medial aspect of the left lobe of the liver. Additional, similar appearing 2.1 cm x 1.6 cm x 1.4 cm and 0.8 cm x 0.7 cm x 0.7 cm lesions are seen within the lateral aspect of the left lobe. Within normal limits in parenchymal echogenicity. Nonocclusive thrombus is seen within the left portal vein and main portal vein on color Doppler imaging with normal direction of blood flow towards the liver. This is seen on the prior MRI. Other: Limited study secondary to limited ability for the patient to cooperate during the exam. A small amount of ascites is seen within the right upper quadrant and right lower quadrant. IMPRESSION: 1. Multiple complex liver lesions consistent with the patient's history of multiple liver abscesses. 2. Ascites with subsequent gallbladder wall thickening. 3. Nonobstructive thrombus within the left portal vein and main portal vein which is seen on prior MRI. Electronically Signed   By: Virgina Norfolk M.D.   On: 08/04/2021 19:55    Anti-infectives: Anti-infectives (From admission, onward)    Start     Dose/Rate Route Frequency Ordered Stop   07/29/21 1800  metroNIDAZOLE (FLAGYL) IVPB 500 mg        500 mg 100 mL/hr over 60 Minutes Intravenous Every 12 hours 07/29/21 0930     07/29/21 1400  cefTRIAXone (ROCEPHIN) 2 g in sodium chloride 0.9 % 100 mL IVPB        2 g 200 mL/hr over 30 Minutes Intravenous Every 24 hours 07/29/21 0945     07/26/21 1400  ceFEPIme (MAXIPIME) 2 g in sodium chloride 0.9 % 100 mL IVPB  Status:  Discontinued         2 g 200 mL/hr over 30 Minutes Intravenous Every 8 hours 07/26/21 0721 07/29/21 0945   07/26/21 0800  ceFEPIme (MAXIPIME) 2 g in sodium chloride 0.9 % 100 mL IVPB  Status:  Discontinued        2 g 200 mL/hr over 30 Minutes Intravenous Every 12 hours 07/25/21 1853 07/26/21 0213   07/26/21 0600  metroNIDAZOLE (FLAGYL) IVPB 500 mg  Status:  Discontinued        500 mg 100 mL/hr over 60 Minutes Intravenous Every 8 hours 07/26/21 0119 07/29/21 0930   07/26/21 0600  ceFEPIme (MAXIPIME) 2 g in sodium chloride 0.9 % 100 mL IVPB  Status:  Discontinued        2 g 200 mL/hr over 30 Minutes Intravenous Every 12 hours 07/26/21 0225 07/26/21 0721   07/25/21 1900  ceFEPIme (MAXIPIME) 2  g in sodium chloride 0.9 % 100 mL IVPB        2 g 200 mL/hr over 30 Minutes Intravenous  Once 07/25/21 1845 07/25/21 2015   07/25/21 1900  metroNIDAZOLE (FLAGYL) IVPB 500 mg        500 mg 100 mL/hr over 60 Minutes Intravenous  Once 07/25/21 1845 07/25/21 2138        Assessment/Plan POD 12 s/p Exploratory laparotomy with drainage of pyogenic liver abscess x 2, Partial omentectomy for Ruptured pyogenic liver abscesses by Dr. Dema Severin - 07/25/2021 - Cont IV abx. Appreciate ID's assistance. On Rocephin/Flagyl. MRCP w/ portal vein thrombosis extending into portal venous bifurcation with multiple hepatic abscesses. They suspect liver abscess are complication of pylephlebitis. ID recommending 4 week of abx. On heparin gtt. RUQ [place] 11/28 w/ multiple complex liver lesions measuring < 3 cm c/w mult liver abscesses; ascites with subsequent GB wall thickening; and nonobstructive thrombus within the left portal vein and main portal vein. GI following. They have asked IR to see for possible liver abscess aspiration. Appears another CT A/P has been ordered as well.  - Continue drains and monitor output. High output from RUQ drain has gone down since switching to gravity bag. Suspect high output was from ascites.  Drain output remains SS  without gross bile.  - LFT's normalized - Patient with ROBF. Was on CLD w/o emesis. Seen by speech with some concern for aspiration (subtle coughing after consumption of thin liquids). Made NPO until can get further testing by speech for clearance/diet recommendations. - Continue PICC/TPN till tolerating PO better. Albumin < 1.5, pre-albumin <5 - Cont WTD BID to midline - Therapies, mobilize - Pulm toilet   FEN - NPO until cleared by speech, IVF per TRH VTE - SCDs, heparin gtt ID - Rocephin/Flagyl. Tmax 100.2. Monitor leukocytosis, 13.4 yesterday Foley - in place, per primary    HTN  Dementia/delirium/agitation - on Seroquel Acute respiratory failure - on BiPAP overnight. CCM following.  COPD - On scheduled duonebs.  GERD DM2 Prior hx IVDU (1970's)   LOS: 11 days   Felicie Morn, [facility] 08/06/2021, 7:25 AM Please see Amion for pager number during day hours 7:00am-4:30pm

## 2021-08-06 NOTE — Progress Notes (Signed)
PHARMACY - TOTAL PARENTERAL NUTRITION CONSULT NOTE   Indication: Prolonged ileus  Patient Measurements: Height: 5\' 7"  (170.2 cm) Weight: 91.6 kg (202 lb) IBW/kg (Calculated) : 66.1 TPN AdjBW (KG): 73.7 Body mass index is 31.64 kg/m.  Assessment: 69 yom with perforated liver abscess s/p partial omentectomy and drains in the right and left upper quadrants.  Speech evaluated for ability to swallow and deemed moderate aspiration risk with NPO status. KUB on 11/21 with possible ileus vs obstructive with worsening on 11/22 imaging. CCM wanted to hold off on TPN start 11/22 and push bowel regimen - BM documented but not significant, smear only per CCM. Pharmacy consulted to initiate TPN 11/23.  Glucose / Insulin: hx uncontrolled DM2. A1c 12.2% (PTA regimen: Lantus 58 units QHS + Novolog 18 units qAC + metformin but unclear compliance). CBGs 120s-150s TPN 40 units insulin in bag, also on insulin glargine 10 units BID + moderate SSI (12u/24hr) Electrolytes: K 3.9 (goal >/=4 with ileus), Mg 1.8 (goal >/=2 with ileus), CoCa ~9.9, Phos 4.0, Bicarb 34, others WNL Renal: SCr stable wnl. BUN WNL.  Hepatic: ALT/AST WNL. Tbili stable. Prealbumin <5, albumin <1.5, TG 75  Intake / Output; MIVF: NGT unable to be placed so far. Drain output up at 100 mL/24hrs, LBM 11/29, UOP up at 1.7 mL/kg/hr.   GI Imaging: 11/23 [place] and MR of abdomen - interval worsening of small bowel dilation suggesting ileus or partial obstruction 11/28 CT ab/pelvis- liver lesions consistent with history of liver abscesses, ascites, nonobstructive thrombus (known) GI Surgeries / Procedures: none since consult for TPN   Central access: PICC planned 11/23 TPN start date: 11/23  Nutritional Goals: Goal TPN rate is 90 mL/hr (provides 125 g of protein and 2200 kcals per day)   RD Assessment: Estimated Needs Total Energy Estimated Needs: 2200-2400 Total Protein Estimated Needs: 125-145 grams Total Fluid Estimated Needs: >2L  Current  Nutrition:  11/29 NPO (bipap dependent, transfer to the ICU>>Speech eval pending)  Plan:  - Continue TPN at 90 mL/hr at 1800 - TPN will provide 125 g protein and 2200 kCal, meeting 100% of protein and kCal needs - Electrolytes in TPN: Adjust Cl:Ac ratio 1:1, Na 50 mEq/L, Inc K 45 mEq/L, Ca 5 mEq/L, Inc Mg 9 mEq/L, and Phos 15 mmol/L. Cl:Ac 1:1 -Will give Mg 4gm x1 today - Add standard MVI and trace elements to TPN.  - Add Pepcid 40mg  to TPN (d/c current IV order) - CBGs improved; continue Glargine at 10 units BID, Moderate q4h SSI and 40 units regular insulin in TPN bag (added to account for TPN dextrose content). F/u CBGs and adjust as needed.  - Monitor TPN labs on Mon/Thurs - F/u resolution of ileus, ability to tolerate PO, drain plan   Thank you for allowing pharmacy to be a part of this patient's care.  Donnald Garre, PharmD Clinical Pharmacist  Please check AMION for all [place] numbers After 10:00 PM, call [place] [phone]

## 2021-08-07 ENCOUNTER — Inpatient Hospital Stay (HOSPITAL_COMMUNITY): Payer: Medicare HMO

## 2021-08-07 DIAGNOSIS — K750 Abscess of liver: Secondary | ICD-10-CM | POA: Diagnosis not present

## 2021-08-07 DIAGNOSIS — R6520 Severe sepsis without septic shock: Secondary | ICD-10-CM | POA: Diagnosis not present

## 2021-08-07 DIAGNOSIS — J9602 Acute respiratory failure with hypercapnia: Secondary | ICD-10-CM | POA: Diagnosis not present

## 2021-08-07 DIAGNOSIS — A419 Sepsis, unspecified organism: Secondary | ICD-10-CM | POA: Diagnosis not present

## 2021-08-07 LAB — CBC
HCT: 29.1 % — ABNORMAL LOW (ref 39.0–52.0)
Hemoglobin: 9.3 g/dL — ABNORMAL LOW (ref 13.0–17.0)
MCH: 31 pg (ref 26.0–34.0)
MCHC: 32 g/dL (ref 30.0–36.0)
MCV: 97 fL (ref 80.0–100.0)
Platelets: 242 10*3/uL (ref 150–400)
RBC: 3 MIL/uL — ABNORMAL LOW (ref 4.22–5.81)
RDW: 15.4 % (ref 11.5–15.5)
WBC: 10.2 10*3/uL (ref 4.0–10.5)
nRBC: 0 % (ref 0.0–0.2)

## 2021-08-07 LAB — MAGNESIUM: Magnesium: 2.3 mg/dL (ref 1.7–2.4)

## 2021-08-07 LAB — COMPREHENSIVE METABOLIC PANEL
ALT: 16 U/L (ref 0–44)
AST: 25 U/L (ref 15–41)
Albumin: <1.5 g/dL — ABNORMAL LOW (ref 3.5–5.0)
Alkaline Phosphatase: 127 U/L — ABNORMAL HIGH (ref 38–126)
Anion gap: 4 — ABNORMAL LOW (ref 5–15)
BUN: 27 mg/dL — ABNORMAL HIGH (ref 8–23)
CO2: 32 mmol/L (ref 22–32)
Calcium: 7.9 mg/dL — ABNORMAL LOW (ref 8.9–10.3)
Chloride: 99 mmol/L (ref 98–111)
Creatinine, Ser: 0.61 mg/dL (ref 0.61–1.24)
GFR, Estimated: >60 mL/min (ref >60)
Glucose, Bld: 133 mg/dL — ABNORMAL HIGH (ref 70–99)
Potassium: 3.9 mmol/L (ref 3.5–5.1)
Sodium: 135 mmol/L (ref 135–145)
Total Bilirubin: 0.2 mg/dL — ABNORMAL LOW (ref 0.3–1.2)
Total Protein: 5.1 g/dL — ABNORMAL LOW (ref 6.5–8.1)

## 2021-08-07 LAB — GLUCOSE, CAPILLARY
Glucose-Capillary: 123 mg/dL — ABNORMAL HIGH (ref 70–99)
Glucose-Capillary: 139 mg/dL — ABNORMAL HIGH (ref 70–99)
Glucose-Capillary: 155 mg/dL — ABNORMAL HIGH (ref 70–99)
Glucose-Capillary: 127 mg/dL — ABNORMAL HIGH (ref 70–99)
Glucose-Capillary: 135 mg/dL — ABNORMAL HIGH (ref 70–99)
Glucose-Capillary: 122 mg/dL — ABNORMAL HIGH (ref 70–99)

## 2021-08-07 LAB — PROTIME-INR
INR: 1.2 (ref 0.8–1.2)
Prothrombin Time: 15.6 seconds — ABNORMAL HIGH (ref 11.4–15.2)

## 2021-08-07 LAB — PHOSPHORUS: Phosphorus: 4.5 mg/dL (ref 2.5–4.6)

## 2021-08-07 LAB — HEPARIN LEVEL (UNFRACTIONATED): Heparin Unfractionated: 0.46 IU/mL (ref 0.30–0.70)

## 2021-08-07 MED ORDER — SODIUM CHLORIDE 0.9% FLUSH
5.0000 mL | Freq: Three times a day (TID) | INTRAVENOUS | Status: DC
  Administered 2021-08-07 – 2021-08-14 (×19): 5 mL

## 2021-08-07 MED ORDER — MIDAZOLAM HCL 2 MG/2ML IJ SOLN
INTRAMUSCULAR | Status: AC
  Filled 2021-08-07: qty 4

## 2021-08-07 MED ORDER — FENTANYL CITRATE (PF) 100 MCG/2ML IJ SOLN
INTRAMUSCULAR | Status: AC | PRN
Start: 2021-08-07 — End: 2021-08-07
  Administered 2021-08-07: 50 ug via INTRAVENOUS
  Administered 2021-08-07: 25 ug via INTRAVENOUS

## 2021-08-07 MED ORDER — FENTANYL CITRATE (PF) 100 MCG/2ML IJ SOLN
INTRAMUSCULAR | Status: AC
  Filled 2021-08-07: qty 4

## 2021-08-07 MED ORDER — MIDAZOLAM HCL 2 MG/2ML IJ SOLN
INTRAMUSCULAR | Status: AC | PRN
  Administered 2021-08-07: .5 mg via INTRAVENOUS
  Administered 2021-08-07 (×2): 1 mg via INTRAVENOUS

## 2021-08-07 MED ORDER — TRAVASOL 10 % IV SOLN
INTRAVENOUS | Status: AC
  Filled 2021-08-07: qty 1252.8

## 2021-08-07 MED ORDER — LIDOCAINE HCL 1 % IJ SOLN
INTRAMUSCULAR | Status: AC
  Filled 2021-08-07: qty 10

## 2021-08-07 NOTE — Consult Note (Signed)
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 is no acute osseous abnormality. IMPRESSION: Patchy opacities in the right midlung have significantly improved. Left basilar/retrocardiac consolidation is unchanged, and there is a probable trace left pleural effusion. Electronically Signed   By: Valetta Mole M.D.   On: 08/05/2021 10:13   DG CHEST PORT 1 VIEW  Result Date: 08/04/2021 CLINICAL DATA:  Hypoxia. EXAM: PORTABLE CHEST 1 VIEW COMPARISON:  Chest radiograph dated 07/29/2021. FINDINGS: Right-sided PICC with tip at the cavoatrial junction. Shallow inspiration. Left lung base and right mid lung field densities may represent atelectasis or infiltrate. No large pleural effusion. No pneumothorax. Stable cardiomegaly. No acute osseous pathology. Lower cervical ACDF. IMPRESSION: 1. Left lung base and right mid lung field densities may represent atelectasis or infiltrate. 2. Stable cardiomegaly. Electronically Signed   By: Anner Crete M.D.   On: 08/04/2021 19:44   DG Chest Port 1 View  Result Date:  07/29/2021 CLINICAL DATA:  Shortness of breath. EXAM: PORTABLE CHEST 1 VIEW COMPARISON:  Chest x-ray 07/27/2021. FINDINGS: Mediastinum stable. Stable cardiomegaly. Left mid and lower lung infiltrates noted on today's exam. Tiny left pleural effusion cannot be excluded. Right costophrenic angle not imaged. No pneumothorax. Prior cervical fusion. IMPRESSION: 1. Left mid lung and left base pulmonary infiltrates noted on today's exam. Tiny left pleural effusion cannot be excluded. 2. Stable cardiomegaly. Electronically Signed   By: Marcello Moores  Register M.D.   On: 07/29/2021 06:18  DG Chest Port 1 View  Result Date: 07/27/2021 CLINICAL DATA:  Follow-up postextubation. EXAM: PORTABLE CHEST 1 VIEW COMPARISON:  Portable chest yesterday at 4:04 a.m. FINDINGS: 4:31 a.m., 07/27/2021. Interval extubation and removal NGT. There is cardiomegaly with stable mild central vascular distension without overt findings of edema. There is increased left infrahilar opacity which could be atelectasis or developing infiltrate. The remaining lungs are clear and no pleural effusion is seen. There are overlying monitor wires and 3 level lower cervical ACDF plating. IMPRESSION: 1. A possible developing left infrahilar infiltrate or atelectasis. 2. Cardiomegaly, stable mild central vascular fullness.  No edema. 3. Interval extubation, removal of NGT. Electronically Signed   By: Telford Nab M.D.   On: 07/27/2021 05:03   DG CHEST PORT 1 VIEW  Result Date: 07/26/2021 CLINICAL DATA:  Respiratory failure. EXAM: PORTABLE CHEST 1 VIEW COMPARISON:  07/25/2021 FINDINGS: 0404 hours. Endotracheal tube tip is 4.9 cm above the base of the carina. The NG tube passes into the stomach although the distal tip position is not included on the film. The lungs are clear without focal pneumonia, edema, pneumothorax or pleural effusion. The cardiopericardial silhouette is within normal limits for size. Telemetry leads overlie the chest. IMPRESSION: No acute  cardiopulmonary findings. Electronically Signed   By: Misty Stanley M.D.   On: 07/26/2021 09:50   DG Chest Port 1 View  Result Date: 07/25/2021 CLINICAL DATA:  Patient found down in bathroom EXAM: PORTABLE CHEST 1 VIEW COMPARISON:  08/16/2018 FINDINGS: The heart size and mediastinal contours are within normal limits. Both lungs are clear. The visualized skeletal structures are unremarkable. Degenerative changes are noted in both AC joints. There is previous surgical fusion in the lower cervical spine. IMPRESSION: No active disease is seen in the chest. Electronically Signed   By: Elmer Picker M.D.   On: 07/25/2021 16:11   DG Abd Portable 1V  Result Date: 07/29/2021 CLINICAL DATA:  Abdominal pain and distention EXAM: PORTABLE ABDOMEN - 1 VIEW COMPARISON:  07/28/2021 FINDINGS: There is mild to moderate dilation of small-bowel loops measuring up to 3.7 cm in diameter. There is interval increase in degree of small bowel dilation. Stomach is moderately distended. There is prominence of mucosal folds in the margin of the stomach. There is no significant dilation of colon. IMPRESSION: There is interval worsening of small bowel dilation suggesting ileus or partial obstruction. Electronically Signed   By: Elmer Picker M.D.   On: 07/29/2021 09:53   ECHOCARDIOGRAM COMPLETE BUBBLE STUDY  Result Date: 07/28/2021    ECHOCARDIOGRAM REPORT   Patient Name:   Matthew Dominguez Date of Exam: 07/28/2021 Medical Rec #:  [id number]     Height:       67.0 in Accession #:    [id number]    Weight:       189.4 lb Date of Birth:  Jun 18, 1951      BSA:          1.976 m Patient Age:    67 years      BP:           92/49 mmHg Patient Gender: M             HR:           90 bpm. Exam Location:  Inpatient Procedure: 2D Echo, Cardiac Doppler, Color Doppler, Saline Contrast Bubble Study            and Intracardiac Opacification Agent Indications:    Absess of liver, bubble  History:        Patient has no prior history of  Echocardiogram examinations.                 COPD; Risk Factors:Diabetes.  Sonographer:    Glo Herring Referring Phys: [id number] [facility]  Sonographer Comments: Technically difficult study due to poor echo windows. IMPRESSIONS  1. Left ventricular ejection fraction, by estimation, is 60 to 65%. The left ventricle has normal function. The left ventricle has no regional wall motion abnormalities. Left ventricular diastolic parameters are consistent with Grade I diastolic dysfunction (impaired relaxation).  2. Right ventricular systolic function is normal. The right ventricular size is normal. Tricuspid regurgitation signal is inadequate for assessing PA pressure.  3. The mitral valve is grossly normal. Trivial mitral valve regurgitation.  4. The aortic valve was not well visualized. Aortic valve regurgitation is not visualized. Aortic valve mean gradient measures 6.0 mmHg.  5. The inferior vena cava is normal in size with greater than 50% respiratory variability, suggesting right atrial pressure of 3 mmHg.  6. Agitated saline contrast bubble study was negative, with no evidence of any interatrial shunt. Comparison(s): No prior Echocardiogram. FINDINGS  Left Ventricle: Left ventricular ejection fraction, by estimation, is 60 to 65%. The left ventricle has normal function. The left ventricle has no regional wall motion abnormalities. Definity contrast agent was given IV to delineate the left ventricular  endocardial borders. The left ventricular internal cavity size was normal in size. There is no left ventricular hypertrophy. Left ventricular diastolic parameters are consistent with Grade I diastolic dysfunction (impaired relaxation). Indeterminate filling pressures. Right Ventricle: The right ventricular size is normal. No increase in right ventricular wall thickness. Right ventricular systolic function is normal. Tricuspid regurgitation signal is inadequate for assessing PA pressure. Left Atrium: Left atrial  size was normal in size. Right Atrium: Right atrial size was normal in size. Pericardium: There is no evidence of pericardial effusion. Mitral Valve: The mitral valve is grossly normal. Trivial mitral valve regurgitation. Tricuspid Valve: The tricuspid valve is grossly normal. Tricuspid valve regurgitation is trivial. Aortic Valve: The aortic valve was not well visualized. Aortic valve regurgitation is not visualized. Aortic valve mean gradient measures 6.0 mmHg. Aortic valve peak gradient measures 11.2 mmHg. Pulmonic Valve: The pulmonic valve was not well visualized. Pulmonic valve regurgitation is not visualized. Aorta: The aortic root and ascending aorta are structurally normal, with no evidence of dilitation. Venous: The inferior vena cava is normal in size with greater than 50% respiratory variability, suggesting right atrial pressure of 3 mmHg. IAS/Shunts: No atrial level shunt detected by color flow Doppler. Agitated saline contrast was given intravenously to evaluate for intracardiac shunting. Agitated saline contrast bubble study was negative, with no evidence of any interatrial shunt.   Diastology LV e' medial:    5.87 cm/s LV E/e' medial:  14.8 LV e' lateral:   6.42 cm/s LV E/e' lateral: 13.6  RIGHT VENTRICLE             IVC RV S prime:     14.70 cm/s  IVC diam: 1.80 cm LEFT ATRIUM             Index LA Vol (A2C):   26.3 ml 13.31 ml/m LA Vol (A4C):   28.8 ml 14.58 ml/m LA Biplane Vol: 28.2 ml 14.27 ml/m  AORTIC VALVE AV Vmax:           167.00 cm/s AV Vmean:          111.000 cm/s  AV VTI:            0.280 m AV Peak Grad:      11.2 mmHg AV Mean Grad:      6.0 mmHg LVOT Vmax:         155.00 cm/s LVOT Vmean:        103.000 cm/s LVOT VTI:          0.266 m LVOT/AV VTI ratio: 0.95  AORTA Ao Root diam: 3.10 cm MITRAL VALVE MV Area (PHT): 3.46 cm     SHUNTS MV Decel Time: 219 msec     Systemic VTI: 0.27 m MV E velocity: 87.00 cm/s MV A velocity: 107.00 cm/s MV E/A ratio:  0.81 Lyman Bishop MD Electronically  signed by Lyman Bishop MD Signature Date/Time: 07/28/2021/11:59:25 AM    Final    DG HIP UNILAT WITH PELVIS 2-3 VIEWS LEFT  Result Date: 07/25/2021 CLINICAL DATA:  Weakness and lightheadedness. EXAM: DG HIP (WITH OR WITHOUT PELVIS) 2-3V LEFT COMPARISON:  None. FINDINGS: There is no evidence of hip fracture or dislocation. There is no evidence of arthropathy or other focal bone abnormality. IMPRESSION: Negative. Electronically Signed   By: Ronney Asters M.D.   On: 07/25/2021 18:20   DG HIP UNILAT WITH PELVIS 2-3 VIEWS RIGHT  Result Date: 07/25/2021 CLINICAL DATA:  Fall. EXAM: DG HIP (WITH OR WITHOUT PELVIS) 2-3V RIGHT COMPARISON:  Right hip x-ray 08/16/2018. FINDINGS: There is no evidence of hip fracture or dislocation. There are mild degenerative changes of both hips with sclerosis and osteophyte formation. There are vascular calcifications in the soft tissues. IMPRESSION: No fracture or dislocation. Electronically Signed   By: Ronney Asters M.D.   On: 07/25/2021 18:25   [place] EKG SITE RITE  Result Date: 07/29/2021 If Site Rite image not attached, placement could not be confirmed due to current cardiac rhythm.  US Abdomen Limited RUQ (LIVER/GB)  Result Date: 08/04/2021 CLINICAL DATA:  Liver abscesses. EXAM: ULTRASOUND ABDOMEN LIMITED RIGHT UPPER QUADRANT COMPARISON:  MRCP, dated July 30, 2021 FINDINGS: Gallbladder: A 2.4 cm gallstone is seen within the dependent portion of the gallbladder lumen. The gallbladder wall measures 7.2 mm in thickness. No sonographic Murphy sign noted by sonographer. Common bile duct: Diameter: 5.8 mm Liver: A 0.9 cm x 0.6 cm x 1.0 cm complex hypoechoic lesion is seen within the medial aspect of the left lobe of the liver. Additional, similar appearing 2.1 cm x 1.6 cm x 1.4 cm and 0.8 cm x 0.7 cm x 0.7 cm lesions are seen within the lateral aspect of the left lobe. Within normal limits in parenchymal echogenicity. Nonocclusive thrombus is seen within the left portal  vein and main portal vein on color Doppler imaging with normal direction of blood flow towards the liver. This is seen on the prior MRI. Other: Limited study secondary to limited ability for the patient to cooperate during the exam. A small amount of ascites is seen within the right upper quadrant and right lower quadrant. IMPRESSION: 1. Multiple complex liver lesions consistent with the patient's history of multiple liver abscesses. 2. Ascites with subsequent gallbladder wall thickening. 3. Nonobstructive thrombus within the left portal vein and main portal vein which is seen on prior MRI. Electronically Signed   By: Virgina Norfolk M.D.   On: 08/04/2021 19:55    Labs:  CBC: Recent Labs    08/04/21 0223 08/05/21 0449 08/05/21 1534 08/05/21 2336 08/06/21 0804 08/06/21 1502 08/07/21 0429  WBC 14.2* 13.4*  --   --  12.2*  --  10.2  HGB 10.4* 10.0*   < > 10.2* 10.3* 9.5* 9.3*  HCT 33.1* 33.1*   < > 30.0* 32.8* 28.0* 29.1*  PLT 251 268  --   --  264  --  242   < > = values in this interval not displayed.    COAGS: Recent Labs    07/25/21 1647 07/30/21 2012 08/05/21 0449 08/07/21 0430  INR 1.3* 1.4* 1.2 1.2  APTT 26 29  --   --     BMP: Recent Labs    12/18/20 0953 02/04/21 0959 04/18/21 1353 08/05/21 0449 08/05/21 1534 08/06/21 0000 08/06/21 0500 08/06/21 1502 08/07/21 0429  NA 137 136   < > 137   < > 137 135 134* 135  K 4.3 4.7   < > 4.2   < > 4.1 3.9 4.1 3.9  CL 99 98   < > 103  --  99 98  --  99  CO2 27 28   < > 30  --  34* 34*  --  32  GLUCOSE 367* 474*   < > 164*  --  150* 143*  --  133*  BUN 12 12   < > 28*  --  23 22  --  27*  CALCIUM 9.6 9.9   < > 7.9*  --  8.0* 7.9*  --  7.9*  CREATININE 0.69* 0.76   < > 0.74  --  0.58* 0.53*  --  0.61  GFRNONAA 96 91   < > >60  --  >60 >60  --  >60  GFRAA 111 106  --   --   --   --   --   --   --    < > = values in this interval not displayed.    LIVER FUNCTION TESTS: Recent Labs    08/02/21 0347 08/03/21 0304  08/04/21 0223 08/07/21 0429  BILITOT 0.9 0.6 0.5 0.2*  AST _0 ALT _1 ALKPHOS 133* 110 114 127*  PROT 5.1* 4.5* 5.1* 5.1*  ALBUMIN <1.5* <1.5* <1.5* <1.5*    TUMOR MARKERS: No results for input(s): AFPTM, CEA, CA199, CHROMGRNA in the last 8760 hours.  Assessment and Plan:  Pyogenic liver abscesses s/p exploratory laparotomy with partial omenectomy and placement of two drains by surgical team; persistent liver abscesses: Matthew Dominguez T. [place]col[place], 70 year old male, is tentatively scheduled today for an image-guided liver abscess aspiration with possible drain placement. Telephone consent was obtained from the patient's daughter Chong Sicilian.  Risks and benefits discussed with the patient including bleeding, infection, damage to adjacent structures, bowel perforation/fistula connection, and sepsis.  All of the patient's questions were answered, patient is agreeable to proceed. He has been NPO (TPN currently infusing). He is on a heparin infusion which will need to be discontinued prior to his procedure. An IR nurse will call the patient's RN with a time to hold heparin. Labs and vitals have been reviewed.   Consent signed and in chart.  Thank you for this interesting consult.  I greatly enjoyed meeting Matthew Dominguez and look forward to participating in their care.  A copy of this report was sent to the requesting provider on this date.  Electronically Signed: Soyla Dryer, AGACNP-BC [phone] 08/07/2021, 8:21 AM   I spent a total of 20 Minutes    in face to face in clinical consultation, greater than 50% of which was counseling/coordinating care for image-guided liver abscess  aspiration with possible drain placement.

## 2021-08-07 NOTE — Progress Notes (Signed)
ANTICOAGULATION CONSULT NOTE  Pharmacy Consult for Heparin Indication: Portal Vein Thrombosis   No Known Allergies  Patient Measurements: Height: 5\' 7"  (170.2 cm) Weight: 91.6 kg (202 lb) IBW/kg (Calculated) : 66.1 Heparin Dosing Weight: 73 kg   Vital Signs: Temp: 97.8 F (36.6 C) (12/01 0751) Temp Source: Axillary (12/01 0751) BP: 120/61 (12/01 0800) Pulse Rate: 68 (12/01 0823)  Labs: Recent Labs    08/05/21 0449 08/05/21 1534 08/05/21 2147 08/05/21 2336 08/06/21 0000 08/06/21 0500 08/06/21 0804 08/06/21 1502 08/07/21 0429 08/07/21 0430  HGB 10.0*   < >  --    < >  --   --  10.3* 9.5* 9.3*  --   HCT 33.1*   < >  --    < >  --   --  32.8* 28.0* 29.1*  --   PLT 268  --   --   --   --   --  264  --  242  --   LABPROT 15.3*  --   --   --   --   --   --   --   --  15.6*  INR 1.2  --   --   --   --   --   --   --   --  1.2  HEPARINUNFRC 0.45  --  0.34  --   --  0.46  --   --  0.46  --   CREATININE 0.74  --   --   --  0.58* 0.53*  --   --  0.61  --    < > = values in this interval not displayed.     Estimated Creatinine Clearance: 90.1 mL/min (by C-G formula based on SCr of 0.61 mg/dL).   Assessment: 70 yo male presented on 07/25/2021 after being found down with AMS found to have perforated liver abscess s/p partial omentectomy and drain placement. MRI with suspected portal vein thrombosis. Results of MRI dicussed with CCM and surgery. Pharmacy consulted to dose heparin.   Heparin level therapeutic at 0.46 on infusion at 2450 units/hr. No bleeding or IV issues noted.   Goal of Therapy:  Heparin level 0.3-0.7 units/ml Monitor platelets by anticoagulation protocol: Yes   Plan:  Continue heparin infusion at 2450 units/hr  Daily heparin level and CBC ordered Planning for drain placement today (12/1) Follow up long term anticoagulation plan post drain  Thank you for allowing pharmacy to be a part of this patient's care.  Donnald Garre, PharmD Clinical  Pharmacist  Please check AMION for all [place] numbers After 10:00 PM, call [place] [phone]

## 2021-08-07 NOTE — Procedures (Signed)
Interventional Radiology Procedure Note  Date of Procedure: 08/07/2021  Procedure: CT guided liver abscess drain placement   Findings:  1. Successful CT guided placement of 12 Fr drainage catheter into left hepatic lobe abscess  2. 15 ml purulent material aspirated, sent to lab   Complications: No immediate complications noted.   Estimated Blood Loss: minimal  Follow-up and Recommendations: 1. Follow up drain output, cultures    Albin Felling, MD  Vascular & Interventional Radiology  08/07/2021 2:47 PM

## 2021-08-07 NOTE — Progress Notes (Signed)
[facility]  Date of Admission:  07/25/2021   Total days of inpatient antibiotics 4  Principal Problem:   Severe sepsis (HCC) Active Problems:   Diabetes mellitus type 2, controlled ([place])   Essential hypertension, benign   COPD (chronic obstructive pulmonary disease) (HCC)   Mild cognitive impairment   Liver abscess   Malnutrition of moderate degree   Portal vein thrombosis   Hypertensive urgency   Ileus, postoperative (HCC)   Acute metabolic encephalopathy   Acute hypercapnic respiratory failure (HCC)   Fever   Acute urinary retention          Assessment: 70 year old male with diabetes poorly controlled, COPD, mild cognitive impairment admitted for pneumoperitoneum secondary to ruptured liver abscess.  CT showed pneumoperitoneum, cholelithiasis, fatty liver.  On arrival LFTs elevated with a T bili of 3.5.  ID consult for antibiotic recommendations.   #Pneumoperitoneum 2/2 ruptured pyogenic liver abscess as a complication of suspected pyelophlebitis SP Ex lap on 11/18 # TPN #Uncontrolled DM A1c 12.5 on 12/18/20 #Chronic fatty liver disease, NASH: LFTs consistently elevated since 2017 #Remote Hx of IVDA(last use 1974) -MRCP showed portal vein thrombosis  and multiple liver abscesses, mild intrahepatic biliary ductal distension. GI consulted. Plan on colonoscopy in the future to evaluate for colon malignancy. Pt is on antibiotics+heparin gtt for possible pyelophlebitis.  -Pt continue to have leukocytosis/fever despite being on IV antibiotics.  IR consulted and pt underwent CT guided aspiration of liver abscess on 12/1, 63ml purulent fluid aspirate and sent for Cx. Of note CT on 11/30 showed improving liver abscesses and b/l pleural effusion.  Recommendations:  -Continue ceftriaxone and metronidazole -Follow blood Cx(pt is getting  TPN) -Follow aspirate Cx -Anticipate atleast 4 weeks of antibiotics - HBV re-immunization on  discharge   Microbiology:   Antibiotics: Cefepime 11/18-21 metronidazole 11/18-p Ceftriaxone 11/22-p  Cultures: Blood Cx: 11/19 NGTD, 11/29 Urine Cx: 11/29      SUBJECTIVE: Pt is on HFNC. He is resting in bed. Asked to be left alone and did not wish to answer too many questions. Allowed me to do a physical exam.   Interval: Afebrile overnight, wbc 10.2K Review of Systems: Review of Systems  All other systems reviewed and are negative.   Scheduled Meds:  amLODipine  10 mg Oral Daily   budesonide (PULMICORT) nebulizer solution  0.5 mg Nebulization BID   Chlorhexidine Gluconate Cloth  6 each Topical Daily   collagenase   Topical Daily   docusate sodium  100 mg Oral BID   doxazosin  1 mg Oral Daily   furosemide  40 mg Intravenous BID   guaiFENesin  15 mL Oral Q4H   insulin aspart  0-15 Units Subcutaneous Q4H   insulin glargine-yfgn  10 Units Subcutaneous BID   ipratropium-albuterol  3 mL Nebulization BID   lidocaine       mouth rinse  15 mL Mouth Rinse BID   metoprolol tartrate  10 mg Intravenous Q6H   QUEtiapine  25 mg Oral QHS   sodium chloride flush  10-40 mL Intracatheter Q12H   sodium chloride flush  10-40 mL Intracatheter Q12H   Continuous Infusions:  sodium chloride Stopped (08/06/21 1126)   cefTRIAXone (ROCEPHIN)  IV 2 g (08/07/21 1303)   heparin Stopped (08/07/21 0958)   metronidazole Stopped (08/07/21 0738)   TPN ADULT (ION) 90 mL/hr at 08/07/21 1300   TPN ADULT (ION)     PRN Meds:.sodium chloride, acetaminophen, albuterol, fentaNYL (  SUBLIMAZE) injection, hydrALAZINE, influenza vaccine adjuvanted, labetalol, LORazepam, ondansetron (ZOFRAN) IV No Known Allergies  OBJECTIVE: Vitals:   08/07/21 1440 08/07/21 1450 08/07/21 1600 08/07/21 1626  BP: (!) 143/53 (!) 140/52 (!) 143/58   Pulse: 94 97 87 100  Resp: 19 18 17 19   Temp:   98.6 F (37 C)   TempSrc:   Oral   SpO2: 100% 100% 94% 96%  Weight:      Height:       Body mass index is 31.64  kg/m.  Physical Exam Constitutional:      General: He is not in acute distress.    Appearance: He is normal weight. He is not toxic-appearing.  HENT:     Head: Normocephalic and atraumatic.     Right Ear: External ear normal.     Left Ear: External ear normal.     Nose: No congestion or rhinorrhea.     Mouth/Throat:     Mouth: Mucous membranes are moist.     Pharynx: Oropharynx is clear.  Eyes:     Extraocular Movements: Extraocular movements intact.     Conjunctiva/sclera: Conjunctivae normal.     Pupils: Pupils are equal, round, and reactive to light.  Cardiovascular:     Rate and Rhythm: Normal rate and regular rhythm.     Heart sounds: No murmur heard.   No friction rub. No gallop.  Pulmonary:     Effort: Pulmonary effort is normal.     Breath sounds: Normal breath sounds.  Abdominal:     Palpations: Abdomen is soft.     Comments: Distended, tender  Musculoskeletal:        General: No swelling. Normal range of motion.     Cervical back: Normal range of motion and neck supple.  Skin:    General: Skin is warm and dry.  Neurological:     General: No focal deficit present.     Mental Status: He is oriented to person, place, and time.  Psychiatric:        Mood and Affect: Mood normal.      Lab Results Lab Results  Component Value Date   WBC 10.2 08/07/2021   HGB 9.3 (L) 08/07/2021   HCT 29.1 (L) 08/07/2021   MCV 97.0 08/07/2021   PLT 242 08/07/2021    Lab Results  Component Value Date   CREATININE 0.61 08/07/2021   BUN 27 (H) 08/07/2021   NA 135 08/07/2021   K 3.9 08/07/2021   CL 99 08/07/2021   CO2 32 08/07/2021    Lab Results  Component Value Date   ALT 16 08/07/2021   AST 25 08/07/2021   ALKPHOS 127 (H) 08/07/2021   BILITOT 0.2 (L) 08/07/2021        Laurice Record, [place] [facility] 08/07/2021, 4:27 PM

## 2021-08-07 NOTE — Progress Notes (Signed)
PHARMACY - TOTAL PARENTERAL NUTRITION CONSULT NOTE   Indication: Prolonged ileus  Patient Measurements: Height: 5\' 7"  (170.2 cm) Weight: 91.6 kg (202 lb) IBW/kg (Calculated) : 66.1 TPN AdjBW (KG): 73.7 Body mass index is 31.64 kg/m.  Assessment: 53 yom with perforated liver abscess s/p partial omentectomy and drains in the right and left upper quadrants.  Speech evaluated for ability to swallow and deemed moderate aspiration risk with NPO status. KUB on 11/21 with possible ileus vs obstructive with worsening on 11/22 imaging. CCM wanted to hold off on TPN start 11/22 and push bowel regimen - BM documented but not significant, smear only per CCM. Pharmacy consulted to initiate TPN 11/23.  Glucose / Insulin: hx uncontrolled DM2. A1c 12.2% (PTA regimen: Lantus 58 units QHS + Novolog 18 units qAC + metformin but unclear compliance). CBGs 120s-150s TPN 40 units insulin in bag, also on insulin glargine 10 units BID + moderate SSI (14u/24hr) Electrolytes: K 4.1 (goal >/=4 with ileus), Mg 2.3 (goal >/=2 with ileus), CoCa ~9.9, Phos 4.5, Bicarb 32, others WNL Renal: SCr stable wnl. BUN WNL.  Hepatic: ALT/AST WNL. Tbili stable. Prealbumin <5, albumin <1.5, TG 75  Intake / Output; MIVF: NGT unable to be placed so far. Drain output up at 30 mL/24hrs, LBM 11/30 x4, UOP 0.9 mL/kg/hr.   GI Imaging: 11/23 [place] and MR of abdomen - interval worsening of small bowel dilation suggesting ileus or partial obstruction 11/28 CT ab/pelvis- liver lesions consistent with history of liver abscesses, ascites, nonobstructive thrombus (known) GI Surgeries / Procedures: none since consult for TPN   Central access: PICC planned 11/23 TPN start date: 11/23  Nutritional Goals: Goal TPN rate is 90 mL/hr (provides 125 g of protein and 2200 kcals per day)   RD Assessment: Estimated Needs Total Energy Estimated Needs: 2200-2400 Total Protein Estimated Needs: 125-145 grams Total Fluid Estimated Needs: >2L  Current  Nutrition:  11/29 NPO (bipap dependent, transfer to the ICU>>Speech eval pending)  Plan:  - Continue TPN at 90 mL/hr at 1800 - TPN will provide 125 g protein and 2200 kCal, meeting 100% of protein and kCal needs - Electrolytes in TPN: Na 50 mEq/L, K 45 mEq/L, Ca 5 mEq/L, Inc Mg 9 mEq/L, and Dec Phos 12 mmol/L. Cl:Ac 1:1 - Add standard MVI and trace elements to TPN.  - Add Pepcid 40mg  to TPN (d/c current IV order) - CBGs improved; continue Glargine at 10 units BID, Moderate q4h SSI and Inc to 45 units regular insulin in TPN bag (added to account for TPN dextrose content). F/u CBGs and adjust as needed.  - Monitor TPN labs on Mon/Thurs - F/u resolution of ileus, ability to tolerate PO -Planning for drain placement 12/1, NPO   Thank you for allowing pharmacy to be a part of this patient's care.  Donnald Garre, PharmD Clinical Pharmacist  Please check AMION for all [facility] numbers After 10:00 PM, call [place] [phone]

## 2021-08-07 NOTE — Progress Notes (Signed)
SLP Cancellation Note  Patient Details Name: Matthew Dominguez MRN: [id number] DOB: 08/12/1951   Cancelled treatment:       Reason Eval/Treat Not Completed: Patient at procedure or test/unavailable. Pt NPO for procedure. Will reschedule for tomorrow.    [name], Katherene Ponto 08/07/2021, 9:08 AM

## 2021-08-07 NOTE — Progress Notes (Signed)
13 Days Post-Op  Subjective: CC: On bipap this morning. Some abdominal pain, he states its improved compared to previous. Per RN multiple BMs overnight. SLP planning FEES today if pt can tolerate from respiratory standpoint.  Objective: Vital signs in last 24 hours: Temp:  [98.5 F (36.9 C)-99.5 F (37.5 C)] 98.8 F (37.1 C) (12/01 0309) Pulse Rate:  [77-106] 77 (12/01 0700) Resp:  [16-30] 23 (12/01 0700) BP: (108-163)/(46-138) 114/52 (12/01 0700) SpO2:  [91 %-100 %] 100 % (12/01 0700) FiO2 (%):  [50 %-60 %] 50 % (12/01 0412) Last BM Date: 08/06/21  Intake/Output from previous day: 11/30 0701 - 12/01 0700 In: 3298 [I.V.:2898.1; IV Piggyback:399.9] Out: 2980 [Urine:2950; Drains:30] Intake/Output this shift: No intake/output data recorded.  PE: Gen:  Awake and alert, on BIPAP Abd: Distended but soft, some tenderness around incision and drains. +bowel sounds. RUQ drain with SS fluid in gravity bag without gross bile. LUQ drain with serous fluid in bulb without gross bile  Psych: A&Ox2  Lab Results:  Recent Labs    08/06/21 0804 08/06/21 1502 08/07/21 0429  WBC 12.2*  --  10.2  HGB 10.3* 9.5* 9.3*  HCT 32.8* 28.0* 29.1*  PLT 264  --  242   BMET Recent Labs    08/06/21 0500 08/06/21 1502 08/07/21 0429  NA 135 134* 135  K 3.9 4.1 3.9  CL 98  --  99  CO2 34*  --  32  GLUCOSE 143*  --  133*  BUN 22  --  27*  CREATININE 0.53*  --  0.61  CALCIUM 7.9*  --  7.9*   PT/INR Recent Labs    08/05/21 0449 08/07/21 0430  LABPROT 15.3* 15.6*  INR 1.2 1.2   CMP     Component Value Date/Time   NA 135 08/07/2021 0429   NA 142 01/15/2016 1031   K 3.9 08/07/2021 0429   CL 99 08/07/2021 0429   CO2 32 08/07/2021 0429   GLUCOSE 133 (H) 08/07/2021 0429   BUN 27 (H) 08/07/2021 0429   BUN 10 01/15/2016 1031   CREATININE 0.61 08/07/2021 0429   CREATININE 0.74 04/18/2021 1353   CALCIUM 7.9 (L) 08/07/2021 0429   PROT 5.1 (L) 08/07/2021 0429   PROT 6.2 01/15/2016  1031   ALBUMIN <1.5 (L) 08/07/2021 0429   ALBUMIN 4.1 01/15/2016 1031   AST 25 08/07/2021 0429   ALT 16 08/07/2021 0429   ALKPHOS 127 (H) 08/07/2021 0429   BILITOT 0.2 (L) 08/07/2021 0429   BILITOT 0.3 01/15/2016 1031   GFRNONAA >60 08/07/2021 0429   GFRNONAA 91 02/04/2021 0959   GFRAA 106 02/04/2021 0959   Lipase     Component Value Date/Time   LIPASE 36 07/27/2021 0420    Studies/Results: CT ABDOMEN PELVIS W CONTRAST  Result Date: 08/06/2021 CLINICAL DATA:  Abdominal abscess/infection suspected. EXAM: CT ABDOMEN AND PELVIS WITH CONTRAST TECHNIQUE: Multidetector CT imaging of the abdomen and pelvis was performed using the standard protocol following bolus administration of intravenous contrast. CONTRAST:  15mL OMNIPAQUE IOHEXOL 350 MG/ML SOLN COMPARISON:  Ultrasound 2 days ago.  CT 07/25/2021. FINDINGS: Lower chest: Bilateral pleural effusions layering dependently. Atelectasis of both lower lobes, more extensive on the left than the right. Hepatobiliary: Diminishing areas of low-density and air within the left lobe of the liver consistent with improving abscesses. Surgical drain between the left lobe of the liver and the diaphragm, with only a small amount of fluid present along the superior margin of the liver.  Calcified gallstones dependent in the gallbladder as seen previously. Ongoing thrombosis of the left portal vein, with a small amount of thrombus in the right main portal vein. Pancreas: Normal Spleen: Normal Adrenals/Urinary Tract: Adrenal glands are normal. Kidneys are normal. Catheter in the bladder. Stomach/Bowel: Stomach and small intestine appear normal. Some fluid levels in the colon suggesting diarrhea. No focal colon pathology identified. Vascular/Lymphatic: Aortic atherosclerosis. No aneurysm. IVC is normal. No adenopathy. Reproductive: Enlarged prostate. Other: Increase in the amount of free intraperitoneal fluid without focal loculation. Musculoskeletal: Chronic  degenerative changes affect the spine. Chronic bilateral pars defects at L5. IMPRESSION: Bilateral pleural effusions layering dependently with lower lobe atelectasis left worse than right. Surgical drain in place extending between the dome of the liver and the diaphragm. Diminishing areas of low-density and air within the left lobe of the liver consistent with improving liver abscesses. Continued demonstration of thrombosis of the left portal vein and partial thrombosis of the right portal vein. Increase in amount of generalized ascites. Chololithiasis as seen previously. Electronically Signed   By: Nelson Chimes M.D.   On: 08/06/2021 13:09   DG Chest Port 1 View  Result Date: 08/06/2021 CLINICAL DATA:  Respiratory abnormality.  Hypoxia. EXAM: PORTABLE CHEST 1 VIEW COMPARISON:  Yesterday FINDINGS: Numerous leads and wires project over the chest. Cervical spine fixation. Mild cardiomegaly. Atherosclerosis in the transverse aorta. Right PICC line tip at superior caval/atrial junction. Small left pleural effusion is unchanged. No pneumothorax. Right perihilar airspace disease is slightly increased. Left lower lobe airspace disease is unchanged. Mild pulmonary venous congestion. IMPRESSION: Increased right perihilar and similar left lower lobe airspace disease, suspicious for pneumonia. Underlying cardiomegaly and mild pulmonary venous congestion. Similar small left pleural effusion. Aortic Atherosclerosis (ICD10-I70.0). Electronically Signed   By: Abigail Miyamoto M.D.   On: 08/06/2021 07:58   DG CHEST PORT 1 VIEW  Result Date: 08/05/2021 CLINICAL DATA:  Hypoxia EXAM: PORTABLE CHEST 1 VIEW COMPARISON:  Chest radiograph 1 day prior FINDINGS: A right upper extremity PICC is in stable position terminating at the cavoatrial junction. The cardiac silhouette is enlarged, unchanged. The upper mediastinal contours are stable. Patchy opacities projecting over the right midlung have significantly improved. There remains  left basilar/retrocardiac consolidation, not significantly changed. There is a probable trace left pleural effusion. There is no significant right pleural effusion. There is no pneumothorax. There is no acute osseous abnormality. IMPRESSION: Patchy opacities in the right midlung have significantly improved. Left basilar/retrocardiac consolidation is unchanged, and there is a probable trace left pleural effusion. Electronically Signed   By: Valetta Mole M.D.   On: 08/05/2021 10:13    Anti-infectives: Anti-infectives (From admission, onward)    Start     Dose/Rate Route Frequency Ordered Stop   07/29/21 1800  metroNIDAZOLE (FLAGYL) IVPB 500 mg        500 mg 100 mL/hr over 60 Minutes Intravenous Every 12 hours 07/29/21 0930     07/29/21 1400  cefTRIAXone (ROCEPHIN) 2 g in sodium chloride 0.9 % 100 mL IVPB        2 g 200 mL/hr over 30 Minutes Intravenous Every 24 hours 07/29/21 0945     07/26/21 1400  ceFEPIme (MAXIPIME) 2 g in sodium chloride 0.9 % 100 mL IVPB  Status:  Discontinued        2 g 200 mL/hr over 30 Minutes Intravenous Every 8 hours 07/26/21 0721 07/29/21 0945   07/26/21 0800  ceFEPIme (MAXIPIME) 2 g in sodium chloride 0.9 % 100 mL  IVPB  Status:  Discontinued        2 g 200 mL/hr over 30 Minutes Intravenous Every 12 hours 07/25/21 1853 07/26/21 0213   07/26/21 0600  metroNIDAZOLE (FLAGYL) IVPB 500 mg  Status:  Discontinued        500 mg 100 mL/hr over 60 Minutes Intravenous Every 8 hours 07/26/21 0119 07/29/21 0930   07/26/21 0600  ceFEPIme (MAXIPIME) 2 g in sodium chloride 0.9 % 100 mL IVPB  Status:  Discontinued        2 g 200 mL/hr over 30 Minutes Intravenous Every 12 hours 07/26/21 0225 07/26/21 0721   07/25/21 1900  ceFEPIme (MAXIPIME) 2 g in sodium chloride 0.9 % 100 mL IVPB        2 g 200 mL/hr over 30 Minutes Intravenous  Once 07/25/21 1845 07/25/21 2015   07/25/21 1900  metroNIDAZOLE (FLAGYL) IVPB 500 mg        500 mg 100 mL/hr over 60 Minutes Intravenous  Once 07/25/21  1845 07/25/21 2138        Assessment/Plan POD 14 s/p Exploratory laparotomy with drainage of pyogenic liver abscess x 2, Partial omentectomy for Ruptured pyogenic liver abscesses by Dr. Dema Severin - 07/25/2021 - Cont IV abx. Appreciate ID's assistance. On Rocephin/Flagyl. MRCP w/ portal vein thrombosis extending into portal venous bifurcation with multiple hepatic abscesses. They suspect liver abscess are complication of pylephlebitis. ID recommending 4 week of abx. On heparin gtt. RUQ [place] 11/28 w/ multiple complex liver lesions measuring < 3 cm c/w mult liver abscesses; ascites with subsequent GB wall thickening; and nonobstructive thrombus within the left portal vein and main portal vein. GI following. - CT A/P 08/06/21 w/ interval improvement of liver abscesses, continue surgical drain. monitor output. High output from RUQ drain has gone down since switching to gravity bag. Suspect high output was from ascites.  Drain output remains SS without gross bile.  - LFT's normalized - Patient with ROBF. Was on CLD w/o emesis. Seen by speech with some concern for aspiration (subtle coughing after consumption of thin liquids). Made NPO until can get further testing by speech for clearance/diet recommendations. ADAT per SLP recommendations. - Continue PICC/TPN till tolerating PO better. Albumin < 1.5, pre-albumin <5 - Cont WTD BID to midline - Therapies, mobilize - Pulm toilet   FEN - NPO until cleared by speech, IVF per TRH VTE - SCDs, heparin gtt ID - Rocephin/Flagyl. Tmax 100.2. Monitor leukocytosis, 13.4 yesterday Foley - in place, per primary    HTN  Dementia/delirium/agitation - on Seroquel Acute respiratory failure - on BiPAP overnight. CCM following.  COPD - On scheduled duonebs.  GERD DM2 Prior hx IVDU (1970's)   LOS: 12 days   Matthew Dominguez, [facility] 08/07/2021, 7:45 AM Please see Amion for pager number during day hours 7:00am-4:30pm

## 2021-08-07 NOTE — Plan of Care (Signed)

## 2021-08-07 NOTE — Progress Notes (Addendum)
NAME:  Matthew Dominguez, MRN:  [id number], DOB:  Jun 10, 1951, LOS: 28 ADMISSION DATE:  07/25/2021, CONSULTATION DATE:  11/18 REFERRING MD:  Roderic Palau, CHIEF COMPLAINT:  Sepsis concern   History of Present Illness:  Matthew Dominguez is a 70 y.o. M who was initially admitted on 07/25/2021 and underwent exploratory laparotomy with drainage of pyogenic liver abscess x2 as well as partial omentectomy for ruptured pyogenic liver abscesses. MRCP 11/23 showed numerous hepatic abscesses with L portal thrombosis extending into portal venous bifurcation, possible ascites, cholelithiasis, generalized bowel edema. RUQ [place] 11/28 showed multiple complex liver lesions measuring <3 cm consistent with multiple liver abscesses, ascites. He has been followed by surgery, infectious disease, IR to this point. On 11/29 he began having worsening respiratory distress at which point PCCM was consulted for further management and concern for intubation need.  Pertinent  Medical History  Asthma, COPD, T2DM, HTN, BPH, hypothyroidism  Significant Hospital Events: Including procedures, antibiotic start and stop dates in addition to other pertinent events   11/18 went from ER to OR for exploratory laparotomy and drainage of pyogenic liver abscesses (2) and partial omentectomy 11/19 ID consulted, Extubated 11/20 Passed bedside swallow. ID consulted. 11/21 Confused, required mitts. Cleviprex gtt continues for hypertension. 11/22 Ongoing Cleviprex needs. O2 needs decreased. Still slow ROBF, consideration of TPN if unable to tolerate feeds. 11/22 cefepime discontinued, changed to ceftriaxone, metronidazole continued. 11/23 MRCP (could not complete study)> numerous hepatic abscesses with left portal vein thrombosis, fluid in abdomen may represent ascites, cholelithiasis, possible stone in cystic duct, bilatearl effusions, mild intrahepatic biliary ductal distension, generalized bowel edema 11/28 increasing O2 requirement and transitioned to  Bipap, PCCM re-consulted 08/05/2021 increasing FiO2 needs desaturations poor air movement transfer to ICU questionable intubation. 11/30 CT abd/pelvis>>> Bilateral pleural effusions layering dependently with lower lobe atelectasis left worse than right. Surgical drain in place extending between the dome of the liver and the diaphragm. Diminishing areas of low-density and air within the left lobe of the liver consistent with improving liver abscesses. Continued demonstration of thrombosis of the left portal vein and partial thrombosis of the right portal vein. Increase in amount of generalized ascites.  Chololithiasis as seen previously.  Interim History / Subjective:  On bipap overnight, back to 20L HFNC this am.  No c/o. Denies SOB.   Objective   Blood pressure 120/61, pulse 68, temperature 97.8 F (36.6 C), temperature source Axillary, resp. rate (!) 22, height 5\' 7"  (1.702 m), weight 91.6 kg, SpO2 98 %.    FiO2 (%):  [50 %] 50 %   Intake/Output Summary (Last 24 hours) at 08/07/2021 0857 Last data filed at 08/07/2021 0701 Gross per 24 hour  Intake 3321.38 ml  Output 2980 ml  Net 341.38 ml   Filed Weights   07/31/21 0417 08/04/21 0818 08/05/21 0751  Weight: 86.6 kg 97.5 kg 91.6 kg    Examination: General:  pleasant male, weak, NAD in beg  HEENT: MM pink/moist Neuro: awake, alert, appropriate, gen weakness, MAE  CV: s1s2 rrr, no m/r/g PULM:  resps even non labored on HFNC, diminished bases otherwise clear  GI: soft, mildly tender diffusely, protuberant, RUQ drain with clear yellow fluids  Extremities: warm/dry, no edema  Skin: no rashes or lesions   Resolved Hospital Problem list     Assessment & Plan:  Acute hypoxemic and hypercarbic respiratory failure Continues to require bipap intermittently and qhs but improving slowly.  PLAN -  - Continue BiPAP as needed and qhs for now  -  continue Lasix 40 mg IV BID - remains net POS 14L - pulmonary hygiene  - mobilize as much as  able  - intermittent f/u CXR  - wean supplemental O2 as able to keep sats >90%  COPD - Continue bronchodilators.  Liver abscess Portal vein thrombosis POD14 s/p exp lap with drainage liver abscess, partial omentectomy CT 11/30 with interval improvement  PLAN -  ID following  Continue rocephin/flagyl - ID recommending 4 weeks total abx  IR following - continuing surgical drain for now  NPO - SLP eval once ok from resp standpoint  Continue TPN  Heparin gtt   Type 2 diabetes mellitus - Novolog SSI - Semglee 10 units BID  Hypertension - Continue metoprolol, amlodipine - Labetolol 5mg  IV q6h, hydralazine 10 mg IV q6h PRN SBP>180 or DBP>100  Acute urinary retention - Doxazosin 1mg  daily  Best Practice (right click and "Reselect all SmartList Selections" daily)   Diet/type: TPN DVT prophylaxis: systemic heparin GI prophylaxis: N/A Lines: Central line Foley:  N/A Code Status:  full code Last date of multidisciplinary goals of care discussion [11/21]  Labs   CBC: Recent Labs  Lab 08/02/21 0347 08/03/21 0304 08/04/21 0223 08/05/21 0449 08/05/21 1534 08/05/21 2336 08/06/21 0804 08/06/21 1502 08/07/21 0429  WBC 14.7* 14.5* 14.2* 13.4*  --   --  12.2*  --  10.2  NEUTROABS 11.5*  --   --   --   --   --   --   --   --   HGB 10.8* 9.6* 10.4* 10.0* 10.5* 10.2* 10.3* 9.5* 9.3*  HCT 34.5* 31.3* 33.1* 33.1* 31.0* 30.0* 32.8* 28.0* 29.1*  MCV 96.9 97.5 99.1 98.8  --   --  99.1  --  97.0  PLT 237 226 251 268  --   --  264  --  643    Basic Metabolic Panel: Recent Labs  Lab 08/03/21 0304 08/04/21 0223 08/05/21 0449 08/05/21 1534 08/05/21 2336 08/06/21 0000 08/06/21 0500 08/06/21 1502 08/07/21 0429  NA 136 138 137   < > 139 137 135 134* 135  K 4.1 4.8 4.2   < > 4.2 4.1 3.9 4.1 3.9  CL 104 105 103  --   --  99 98  --  99  CO2 27 25 30   --   --  34* 34*  --  32  GLUCOSE 174* 241* 164*  --   --  150* 143*  --  133*  BUN 24* 28* 28*  --   --  23 22  --  27*   CREATININE 0.70 0.72 0.74  --   --  0.58* 0.53*  --  0.61  CALCIUM 7.2* 7.6* 7.9*  --   --  8.0* 7.9*  --  7.9*  MG 1.8 1.8 1.7  --   --  1.8 1.8  --  2.3  PHOS 3.2 3.6 4.0  --   --   --  4.0  --  4.5   < > = values in this interval not displayed.   GFR: Estimated Creatinine Clearance: 90.1 mL/min (by C-G formula based on SCr of 0.61 mg/dL). Recent Labs  Lab 08/04/21 0223 08/05/21 0449 08/06/21 0804 08/07/21 0429  WBC 14.2* 13.4* 12.2* 10.2    Liver Function Tests: Recent Labs  Lab 08/02/21 0347 08/03/21 0304 08/04/21 0223 08/07/21 0429  AST 21 21 25 25   ALT 24 19 18 16   ALKPHOS 133* 110 114 127*  BILITOT 0.9 0.6 0.5 0.2*  PROT 5.1* 4.5* 5.1* 5.1*  ALBUMIN <1.5* <1.5* <1.5* <1.5*   No results for input(s): LIPASE, AMYLASE in the last 168 hours. No results for input(s): AMMONIA in the last 168 hours.  ABG    Component Value Date/Time   PHART 7.506 (H) 08/06/2021 1502   PCO2ART 46.1 08/06/2021 1502   PO2ART 55 (L) 08/06/2021 1502   HCO3 36.3 (H) 08/06/2021 1502   TCO2 38 (H) 08/06/2021 1502   ACIDBASEDEF 1.0 07/26/2021 0039   O2SAT 90.0 08/06/2021 1502     Coagulation Profile: Recent Labs  Lab 08/05/21 0449 08/07/21 0430  INR 1.2 1.2    Cardiac Enzymes: No results for input(s): CKTOTAL, CKMB, CKMBINDEX, TROPONINI in the last 168 hours.  HbA1C: Hgb A1c MFr Bld  Date/Time Value Ref Range Status  04/18/2021 01:53 PM 12.2 (H) <5.7 % of total Hgb Final    Comment:    For someone without known diabetes, a hemoglobin A1c value of 6.5% or greater indicates that they may have  diabetes and this should be confirmed with a follow-up  test. . For someone with known diabetes, a value <7% indicates  that their diabetes is well controlled and a value  greater than or equal to 7% indicates suboptimal  control. A1c targets should be individualized based on  duration of diabetes, age, comorbid conditions, and  other considerations. . Currently, no consensus  exists regarding use of hemoglobin A1c for diagnosis of diabetes for children. Marland Kitchen   12/18/2020 09:53 AM 12.5 (H) <5.7 % of total Hgb Final    Comment:    For someone without known diabetes, a hemoglobin A1c value of 6.5% or greater indicates that they may have  diabetes and this should be confirmed with a follow-up  test. . For someone with known diabetes, a value <7% indicates  that their diabetes is well controlled and a value  greater than or equal to 7% indicates suboptimal  control. A1c targets should be individualized based on  duration of diabetes, age, comorbid conditions, and  other considerations. . Currently, no consensus exists regarding use of hemoglobin A1c for diagnosis of diabetes for children. .     CBG: Recent Labs  Lab 08/06/21 1505 08/06/21 1915 08/06/21 2309 08/07/21 0306 08/07/21 0748  GLUCAP 149* 152* 158* 123* 139*     Critical care time: 32 minutes    Nickolas Madrid, NP Pulmonary/Critical Care Medicine  08/07/2021  8:57 AM

## 2021-08-07 NOTE — Progress Notes (Signed)
Occupational Therapy Treatment Patient Details Name: Matthew Dominguez MRN: [id number] DOB: [birth year] Today's Date: 08/07/2021   History of present illness 70 yo admitted 11/18 after found down at home with AMS. Pt with sepsis due to ruptured liver abscess s/p ex lap with drainage same date. Pt intubated 11/18-11/19.  11/29 with respiratory failure, now on/off Bipap.  12/1 on HHFNC. PMhx: DM, HTN, COPD, mild cognitive impairment   OT comments  Patient supine in bed and agreeable to OT, cleared by RN. 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 He follows most simple commands, requires mod assist to comb hair.  Noted L lateral lean with supported sitting up in bed. Poor awareness and attention throughout session.  On HHFNC at 25L 70% FiO2 with VSS.  Plan for IR procedure today.  Will follow acutely.    Recommendations for follow up therapy are one component of a multi-disciplinary discharge planning process, led by the attending physician.  Recommendations may be updated based on patient status, additional functional criteria and insurance authorization.    Follow Up Recommendations  Skilled nursing-short term rehab (<3 hours/day)    Assistance Recommended at Discharge Frequent or constant Supervision/Assistance  Equipment Recommendations  None recommended by OT    Recommendations for Other Services      Precautions / Restrictions Precautions Precautions: Fall Precaution Comments: watch sats Restrictions Weight Bearing Restrictions: No       Mobility Bed Mobility               General bed mobility comments: total assist +2 to reposition in bed    Transfers                         Balance Overall balance assessment: Needs assistance     Sitting balance - Comments: L lateral lean sitting suppported in bed Postural control:  Left lateral lean                                 ADL either performed or assessed with clinical judgement   ADL Overall ADL's : Needs assistance/impaired Eating/Feeding: NPO   Grooming: Moderate assistance;Bed level Grooming Details (indicate cue type and reason): upright in bed with mod assist to comb hair and wash face, requires cueing to sustain attention to task                             Functional mobility during ADLs: Total assistance;+2 for physical assistance;+2 for safety/equipment General ADL Comments: remained bed level, total +2 to scoot up to [facility]    Extremity/Trunk Assessment              Vision       Perception     Praxis      Cognition Arousal/Alertness: Awake/alert Behavior During Therapy: WFL for tasks assessed/performed Overall Cognitive Status: Impaired/Different from baseline Area of Impairment: Orientation;Attention;Following commands;Safety/judgement;Problem solving;Memory;Awareness                 Orientation Level: Disoriented to;Place;Time;Situation Current Attention Level: Focused Memory: Decreased recall of precautions;Decreased short-term memory Following Commands: Follows one step commands inconsistently;Follows one step commands with increased time Safety/Judgement: Decreased awareness of safety;Decreased awareness of deficits Awareness: Intellectual Problem Solving: Slow processing;Decreased  initiation;Difficulty sequencing;Requires verbal cues;Requires tactile cues General Comments: pt oriented to self only, reports at [facility] shopping.  Pt with decreased awareness, problem solving and recall. Follows most simple commands with increased time.          Exercises Exercises: General Upper Extremity General Exercises - Upper Extremity Shoulder Flexion: AROM;Both;10 reps;Supine   Shoulder Instructions       General Comments on 25L HHFNC 70% FIO2; VSS    Pertinent Vitals/ Pain       Pain  Assessment: No/denies pain  Home Living                                          Prior Functioning/Environment              Frequency  Min 2X/week        Progress Toward Goals  OT Goals(current goals can now be found in the care plan section)  Progress towards OT goals: Progressing toward goals  Acute Rehab OT Goals OT Goal Formulation: Patient unable to participate in goal setting  Plan Discharge plan remains appropriate;Frequency remains appropriate    Co-evaluation                 AM-PAC OT "6 Clicks" Daily Activity     Outcome Measure   Help from another person eating meals?: Total Help from another person taking care of personal grooming?: A Lot Help from another person toileting, which includes using toliet, bedpan, or urinal?: Total Help from another person bathing (including washing, rinsing, drying)?: A Lot Help from another person to put on and taking off regular upper body clothing?: Total Help from another person to put on and taking off regular lower body clothing?: Total 6 Click Score: 8    End of Session Equipment Utilized During Treatment: Oxygen  OT Visit Diagnosis: Unsteadiness on feet (R26.81);Cognitive communication deficit (R41.841)   Activity Tolerance Patient tolerated treatment well   Patient Left in bed;with call bell/phone within reach;with bed alarm set;with restraints reapplied   Nurse Communication Mobility status;Precautions        Time: 0165-5374 OT Time Calculation (min): 19 min  Charges: OT General Charges $OT Visit: 1 Visit OT Treatments $Self Care/Home Management : 8-22 mins  Jolaine Artist, OT [place] Pager [phone] Office [phone]   Matthew Dominguez 08/07/2021, 12:15 PM

## 2021-08-08 ENCOUNTER — Inpatient Hospital Stay (HOSPITAL_COMMUNITY): Payer: Medicare HMO

## 2021-08-08 DIAGNOSIS — R6520 Severe sepsis without septic shock: Secondary | ICD-10-CM | POA: Diagnosis not present

## 2021-08-08 DIAGNOSIS — A419 Sepsis, unspecified organism: Secondary | ICD-10-CM | POA: Diagnosis not present

## 2021-08-08 LAB — BASIC METABOLIC PANEL
Anion gap: 5 (ref 5–15)
BUN: 27 mg/dL — ABNORMAL HIGH (ref 8–23)
CO2: 29 mmol/L (ref 22–32)
Calcium: 7.3 mg/dL — ABNORMAL LOW (ref 8.9–10.3)
Chloride: 101 mmol/L (ref 98–111)
Creatinine, Ser: 0.48 mg/dL — ABNORMAL LOW (ref 0.61–1.24)
GFR, Estimated: >60 mL/min (ref >60)
Glucose, Bld: 142 mg/dL — ABNORMAL HIGH (ref 70–99)
Potassium: 3.8 mmol/L (ref 3.5–5.1)
Sodium: 135 mmol/L (ref 135–145)

## 2021-08-08 LAB — CBC
HCT: 28.1 % — ABNORMAL LOW (ref 39.0–52.0)
Hemoglobin: 8.9 g/dL — ABNORMAL LOW (ref 13.0–17.0)
MCH: 31.1 pg (ref 26.0–34.0)
MCHC: 31.7 g/dL (ref 30.0–36.0)
MCV: 98.3 fL (ref 80.0–100.0)
Platelets: 211 10*3/uL (ref 150–400)
RBC: 2.86 MIL/uL — ABNORMAL LOW (ref 4.22–5.81)
RDW: 15.3 % (ref 11.5–15.5)
WBC: 8.4 10*3/uL (ref 4.0–10.5)
nRBC: 0 % (ref 0.0–0.2)

## 2021-08-08 LAB — PHOSPHORUS: Phosphorus: 3.5 mg/dL (ref 2.5–4.6)

## 2021-08-08 LAB — GLUCOSE, CAPILLARY
Glucose-Capillary: 128 mg/dL — ABNORMAL HIGH (ref 70–99)
Glucose-Capillary: 153 mg/dL — ABNORMAL HIGH (ref 70–99)
Glucose-Capillary: 179 mg/dL — ABNORMAL HIGH (ref 70–99)
Glucose-Capillary: 163 mg/dL — ABNORMAL HIGH (ref 70–99)
Glucose-Capillary: 137 mg/dL — ABNORMAL HIGH (ref 70–99)
Glucose-Capillary: 116 mg/dL — ABNORMAL HIGH (ref 70–99)

## 2021-08-08 LAB — MAGNESIUM: Magnesium: 1.8 mg/dL (ref 1.7–2.4)

## 2021-08-08 LAB — HEPARIN LEVEL (UNFRACTIONATED): Heparin Unfractionated: 0.41 IU/mL (ref 0.30–0.70)

## 2021-08-08 MED ORDER — PROSOURCE TF PO LIQD: 45.0000 mL | Freq: Four times a day (QID) | ORAL | Status: DC

## 2021-08-08 MED ORDER — POTASSIUM CHLORIDE 10 MEQ/50ML IV SOLN
10.0000 meq | INTRAVENOUS | Status: AC
  Administered 2021-08-08 (×2): 10 meq via INTRAVENOUS
  Filled 2021-08-08 (×2): qty 50

## 2021-08-08 MED ORDER — OSMOLITE 1.5 CAL PO LIQD
1000.0000 mL | ORAL | Status: DC
  Filled 2021-08-08: qty 1000

## 2021-08-08 MED ORDER — IPRATROPIUM-ALBUTEROL 0.5-2.5 (3) MG/3ML IN SOLN
3.0000 mL | Freq: Four times a day (QID) | RESPIRATORY_TRACT | Status: DC
  Administered 2021-08-08 – 2021-08-10 (×8): 3 mL via RESPIRATORY_TRACT
  Filled 2021-08-08 (×8): qty 3

## 2021-08-08 MED ORDER — MAGNESIUM SULFATE 2 GM/50ML IV SOLN
2.0000 g | Freq: Once | INTRAVENOUS | Status: AC
  Administered 2021-08-08: 2 g via INTRAVENOUS
  Filled 2021-08-08: qty 50

## 2021-08-08 MED ORDER — TRAVASOL 10 % IV SOLN
INTRAVENOUS | Status: AC
  Filled 2021-08-08: qty 1252.8

## 2021-08-08 MED ORDER — FUROSEMIDE 10 MG/ML IJ SOLN
60.0000 mg | Freq: Two times a day (BID) | INTRAMUSCULAR | Status: DC
  Administered 2021-08-08 – 2021-08-10 (×5): 60 mg via INTRAVENOUS
  Filled 2021-08-08 (×5): qty 6

## 2021-08-08 NOTE — Progress Notes (Signed)
PHARMACY - TOTAL PARENTERAL NUTRITION CONSULT NOTE   Indication: Prolonged ileus  Patient Measurements: Height: 5\' 7"  (170.2 cm) Weight: 91.6 kg (202 lb) IBW/kg (Calculated) : 66.1 TPN AdjBW (KG): 73.7 Body mass index is 31.64 kg/m.  Assessment: 44 yom with perforated liver abscess s/p partial omentectomy and drains in the right and left upper quadrants.  Speech evaluated for ability to swallow and deemed moderate aspiration risk with NPO status. KUB on 11/21 with possible ileus vs obstructive with worsening on 11/22 imaging. CCM wanted to hold off on TPN start 11/22 and push bowel regimen - BM documented but not significant, smear only per CCM. Pharmacy consulted to initiate TPN 11/23.  Glucose / Insulin: hx uncontrolled DM2. A1c 12.2% (PTA regimen: Lantus 58 units QHS + Novolog 18 units qAC + metformin but unclear compliance). CBGs 120s-150s TPN 40 units insulin in bag, also on insulin glargine 10 units BID + moderate SSI (14u/24hr) Electrolytes: K 3.8 (goal >/=4 with ileus), Mg 1.8 (goal >/=2 with ileus), CoCa ~9.9, Phos 3.5, Bicarb 29, others WNL Renal: SCr stable wnl. BUN WNL.  Hepatic: ALT/AST WNL. Tbili stable. Prealbumin <5, albumin <1.5, TG 75  Intake / Output; MIVF: NGT unable to be placed so far. Drain output up at 55 mL/24hrs, LBM 11/30 x4, UOP 1.3 mL/kg/hr.   GI Imaging: 11/23 [place] and MR of abdomen - interval worsening of small bowel dilation suggesting ileus or partial obstruction 11/28 CT ab/pelvis- liver lesions consistent with history of liver abscesses, ascites, nonobstructive thrombus (known) GI Surgeries / Procedures: none since consult for TPN   Central access: PICC planned 11/23 TPN start date: 11/23  Nutritional Goals: Goal TPN rate is 90 mL/hr (provides 125 g of protein and 2200 kcals per day)   RD Assessment: Estimated Needs Total Energy Estimated Needs: 2200-2400 Total Protein Estimated Needs: 125-145 grams Total Fluid Estimated Needs: >2L  Current  Nutrition:  11/29 NPO (bipap dependent, transfer to the ICU>>Speech eval pending)  Plan:  - Continue TPN at 90 mL/hr at 1800 - TPN will provide 125 g protein and 2200 kCal, meeting 100% of protein and kCal needs - Electrolytes in TPN: Na 50 mEq/L, Incr K 50 mEq/L, Ca 5 mEq/L, Inc Mg 10 mEq/L, and Dec Phos 12 mmol/L. Cl:Ac 1:1 - Add standard MVI and trace elements to TPN.  - Add Pepcid 40mg  to TPN  - CBGs improved; D/c Glargine, Moderate q4h SSI and Inc to 65 units regular insulin in TPN bag . F/u CBGs and adjust as needed.  KCL 20 meq IV x 1 Mag 2 gm IV x1 - Monitor TPN labs on Mon/Thurs - F/u resolution of ileus, ability to tolerate PO   Thank you for allowing pharmacy to be a part of this patient's care.  Alanda Slim, PharmD, [facility] Clinical Pharmacist Please see AMION for all Pharmacists' Contact Phone Numbers 08/08/2021, 8:34 AM

## 2021-08-08 NOTE — Procedures (Addendum)
Cortrak  Person Inserting Tube:  Matthew Dominguez, RD Tube Type:  Cortrak - 43 inches Tube Size:  10 Tube Location:  Left nare Initial Placement:  Postpyloric Secured by: Bridle Technique Used to Measure Tube Placement:  Marking at nare/corner of mouth Cortrak Secured At:  81 cm Procedure Comments:  Cortrak Tube Team Note:  Consult received to place a Cortrak feeding tube.   X-ray is required, abdominal x-ray has been ordered by the Cortrak team. Please confirm tube placement before using the Cortrak tube.   If the tube becomes dislodged please keep the tube and contact the Cortrak team at [email or web address] (password [email or web address]) for replacement.  If after hours and replacement cannot be delayed, place a NG tube and confirm placement with an abdominal x-ray.     Matthew Passey MS, RDN, LDN, CNSC Registered Dietitian III Clinical Nutrition RD Pager and On-Call Pager Number Located in [place]

## 2021-08-08 NOTE — Progress Notes (Signed)
Referring Physician(s): Gaylyn Lambert, NP  Supervising Physician: Markus Daft  Patient Status:  [facility] - In-pt  Chief Complaint: Follow up liver abscess drain placed 08/07/21 in IR  Subjective:  Patient sleeping, arouses briefly to tactile cues but returns to sleep. No visitors/staff at bedside during exam.  Allergies: Patient has no known allergies.  Medications: Prior to Admission medications   Medication Sig Start Date End Date Taking? Authorizing Provider  aspirin EC 81 MG tablet Take 81 mg by mouth daily.   Yes [provider]  bisoprolol (ZEBETA) 10 MG tablet Take 1 tablet (10 mg total) by mouth daily. 12/23/20  Yes Lauree Chandler, NP  fenofibrate 160 MG tablet Take 1 tablet (160 mg total) by mouth daily. 12/23/20  Yes Lauree Chandler, NP  finasteride (PROSCAR) 5 MG tablet Take 1 tablet (5 mg total) by mouth daily. 12/23/20  Yes Lauree Chandler, NP  ibuprofen (ADVIL) 600 MG tablet Take 1 tablet (600 mg total) by mouth every 8 (eight) hours as needed. 07/15/21  Yes Ngetich, Dinah C, NP  insulin glargine (LANTUS SOLOSTAR) 100 UNIT/ML Solostar Pen Inject 58 Units into the skin at bedtime. 12/23/20  Yes Eubanks, Carlos American, NP  LORazepam (ATIVAN) 1 MG tablet TAKE 1 TABLET BY MOUTH ONCE DAILY AT BEDTIME AS NEEDED FOR ANXIETY Patient taking differently: Take 1 mg by mouth at bedtime as needed for anxiety. 07/17/21  Yes Ngetich, Dinah C, NP  losartan (COZAAR) 50 MG tablet Take 1 tablet by mouth once daily Patient taking differently: Take 50 mg by mouth daily. 08/06/20  Yes [name], Tiffany L, DO  metFORMIN (GLUCOPHAGE) 1000 MG tablet Take 1 tablet (1,000 mg total) by mouth 2 (two) times daily with a meal. 12/25/19  Yes [name], Tiffany L, DO  Multiple Vitamins-Minerals (MULTIVITAMIN MEN PO) Take 1 capsule by mouth daily.   Yes [provider]  NOVOLIN R FLEXPEN RELION 100 UNIT/ML FlexPen INJECT 18 UNITS SUBCUTANEOUSLY TWICE DAILY AS DIRECTED BEFORE A MEAL Patient taking  differently: Inject 18 Units into the skin in the morning and at bedtime. 06/23/21  Yes Ngetich, Dinah C, NP  omeprazole (PRILOSEC) 20 MG capsule Take 20 mg by mouth daily.   Yes [provider]  senna-docusate (SENOKOT-S) 8.6-50 MG tablet Take 1 tablet by mouth 4 (four) times daily as needed (constipation from pain med). 08/08/18  Yes [name], Tiffany L, DO  SYMBICORT 80-4.5 MCG/ACT inhaler INHALE 2 PUFFS BY MOUTH TWICE DAILY IN  THE  MORNING  AND  12  HOURS  LATER Patient taking differently: Inhale 2 puffs into the lungs in the morning and at bedtime. 02/28/21  Yes Ngetich, Dinah C, NP  tamsulosin (FLOMAX) 0.4 MG CAPS capsule Take 1 capsule (0.4 mg total) by mouth daily. 04/09/21  Yes Ngetich, Dinah C, NP  Tiotropium Bromide-Olodaterol (STIOLTO RESPIMAT) 2.5-2.5 MCG/ACT AERS Inhale 2 puffs into the lungs daily. 05/19/19  Yes [name], Tiffany L, DO  zolpidem (AMBIEN) 5 MG tablet TAKE 1 TABLET BY MOUTH AT BEDTIME AS NEEDED FOR SLEEP Patient taking differently: Take 5 mg by mouth at bedtime as needed for sleep. 07/17/21  Yes Ngetich, Dinah C, NP  Accu-Chek FastClix Lancets MISC Use to test blood sugar three times daily. DX: E11.9 04/26/19   [name], Tiffany L, DO  Blood Glucose Monitoring Suppl (ACCU-CHEK AVIVA PLUS) w/Device KIT Use to test blood sugar three time daily. DX: E11.9 04/26/19   [name], Tiffany L, DO  glucose blood (ACCU-CHEK AVIVA PLUS) test strip Accu  Chek Aviva Plus Test Strips Use to test blood sugar three times daily. DX: E11.9 10/16/19   [name], Tiffany L, DO     Vital Signs: BP (!) 116/53   Pulse 100   Temp 98.6 F (37 C) (Oral)   Resp (!) 22   Ht 5' 7"  (1.702 m)   Wt 202 lb (91.6 kg)   SpO2 91%   BMI 31.64 kg/m   Physical Exam Vitals reviewed.  Constitutional:      Comments: Somnolent  HENT:     Head: Normocephalic.  Cardiovascular:     Rate and Rhythm: Tachycardia present.  Abdominal:     General: There is distension.     Palpations: Abdomen is soft.     Comments: (+)  RUQ and LUQ surgical drains with serosanguineous output (+) left lateral IR drain also with serosanguineous output. Flushes easily. Insertion site unremarkable.  Skin:    General: Skin is warm and dry.  Neurological:     Mental Status: Mental status is at baseline.    Imaging: CT ABDOMEN PELVIS W CONTRAST  Result Date: 08/06/2021 CLINICAL DATA:  Abdominal abscess/infection suspected. EXAM: CT ABDOMEN AND PELVIS WITH CONTRAST TECHNIQUE: Multidetector CT imaging of the abdomen and pelvis was performed using the standard protocol following bolus administration of intravenous contrast. CONTRAST:  171m OMNIPAQUE IOHEXOL 350 MG/ML SOLN COMPARISON:  Ultrasound 2 days ago.  CT 07/25/2021. FINDINGS: Lower chest: Bilateral pleural effusions layering dependently. Atelectasis of both lower lobes, more extensive on the left than the right. Hepatobiliary: Diminishing areas of low-density and air within the left lobe of the liver consistent with improving abscesses. Surgical drain between the left lobe of the liver and the diaphragm, with only a small amount of fluid present along the superior margin of the liver. Calcified gallstones dependent in the gallbladder as seen previously. Ongoing thrombosis of the left portal vein, with a small amount of thrombus in the right main portal vein. Pancreas: Normal Spleen: Normal Adrenals/Urinary Tract: Adrenal glands are normal. Kidneys are normal. Catheter in the bladder. Stomach/Bowel: Stomach and small intestine appear normal. Some fluid levels in the colon suggesting diarrhea. No focal colon pathology identified. Vascular/Lymphatic: Aortic atherosclerosis. No aneurysm. IVC is normal. No adenopathy. Reproductive: Enlarged prostate. Other: Increase in the amount of free intraperitoneal fluid without focal loculation. Musculoskeletal: Chronic degenerative changes affect the spine. Chronic bilateral pars defects at L5. IMPRESSION: Bilateral pleural effusions layering  dependently with lower lobe atelectasis left worse than right. Surgical drain in place extending between the dome of the liver and the diaphragm. Diminishing areas of low-density and air within the left lobe of the liver consistent with improving liver abscesses. Continued demonstration of thrombosis of the left portal vein and partial thrombosis of the right portal vein. Increase in amount of generalized ascites. Chololithiasis as seen previously. Electronically Signed   By: MNelson ChimesM.D.   On: 08/06/2021 13:09   DG Chest Port 1 View  Result Date: 08/08/2021 CLINICAL DATA:  Respiratory failure. EXAM: PORTABLE CHEST 1 VIEW COMPARISON:  August 07, 2021. FINDINGS: Stable cardiomediastinal silhouette. Right lung is clear. Slightly improved left basilar atelectasis or pneumonia is noted. Right-sided PICC line is again noted. No pneumothorax is noted. Bony thorax is unremarkable. IMPRESSION: Slightly improved left basilar atelectasis or infiltrate. Electronically Signed   By: JMarijo ConceptionM.D.   On: 08/08/2021 09:18   DG CHEST PORT 1 VIEW  Result Date: 08/07/2021 CLINICAL DATA:  Respiratory failure EXAM: PORTABLE CHEST 1 VIEW COMPARISON:  08/06/2021 FINDINGS: Progression of left lower lobe airspace consolidation as well as left effusion. Right lung remains clear. Negative for heart failure or edema Right arm PICC tip in the SVC unchanged. IMPRESSION: Progressive left lower lobe airspace disease and left effusion. Possible pneumonia. Electronically Signed   By: Franchot Gallo M.D.   On: 08/07/2021 15:43   DG Chest Port 1 View  Result Date: 08/06/2021 CLINICAL DATA:  Respiratory abnormality.  Hypoxia. EXAM: PORTABLE CHEST 1 VIEW COMPARISON:  Yesterday FINDINGS: Numerous leads and wires project over the chest. Cervical spine fixation. Mild cardiomegaly. Atherosclerosis in the transverse aorta. Right PICC line tip at superior caval/atrial junction. Small left pleural effusion is unchanged. No  pneumothorax. Right perihilar airspace disease is slightly increased. Left lower lobe airspace disease is unchanged. Mild pulmonary venous congestion. IMPRESSION: Increased right perihilar and similar left lower lobe airspace disease, suspicious for pneumonia. Underlying cardiomegaly and mild pulmonary venous congestion. Similar small left pleural effusion. Aortic Atherosclerosis (ICD10-I70.0). Electronically Signed   By: Abigail Miyamoto M.D.   On: 08/06/2021 07:58   DG CHEST PORT 1 VIEW  Result Date: 08/05/2021 CLINICAL DATA:  Hypoxia EXAM: PORTABLE CHEST 1 VIEW COMPARISON:  Chest radiograph 1 day prior FINDINGS: A right upper extremity PICC is in stable position terminating at the cavoatrial junction. The cardiac silhouette is enlarged, unchanged. The upper mediastinal contours are stable. Patchy opacities projecting over the right midlung have significantly improved. There remains left basilar/retrocardiac consolidation, not significantly changed. There is a probable trace left pleural effusion. There is no significant right pleural effusion. There is no pneumothorax. There is no acute osseous abnormality. IMPRESSION: Patchy opacities in the right midlung have significantly improved. Left basilar/retrocardiac consolidation is unchanged, and there is a probable trace left pleural effusion. Electronically Signed   By: Valetta Mole M.D.   On: 08/05/2021 10:13   DG CHEST PORT 1 VIEW  Result Date: 08/04/2021 CLINICAL DATA:  Hypoxia. EXAM: PORTABLE CHEST 1 VIEW COMPARISON:  Chest radiograph dated 07/29/2021. FINDINGS: Right-sided PICC with tip at the cavoatrial junction. Shallow inspiration. Left lung base and right mid lung field densities may represent atelectasis or infiltrate. No large pleural effusion. No pneumothorax. Stable cardiomegaly. No acute osseous pathology. Lower cervical ACDF. IMPRESSION: 1. Left lung base and right mid lung field densities may represent atelectasis or infiltrate. 2. Stable  cardiomegaly. Electronically Signed   By: Anner Crete M.D.   On: 08/04/2021 19:44   CT IMAGE GUIDED DRAINAGE BY PERCUTANEOUS CATHETER  Result Date: 08/07/2021 INDICATION: Hepatic abscess EXAM: CT-guided intra-abdominal drain placement into hepatic abscess MEDICATIONS: The patient is currently admitted to the hospital and receiving intravenous antibiotics. The antibiotics were administered within an appropriate time frame prior to the initiation of the procedure. ANESTHESIA/SEDATION: Moderate (conscious) sedation was employed during this procedure. A total of Versed 2.5 mg and Fentanyl 75 mcg was administered intravenously by the radiology nurse. Total intra-service moderate Sedation Time: 21 minutes. The patient's level of consciousness and vital signs were monitored continuously by radiology nursing throughout the procedure under my direct supervision. COMPLICATIONS: None immediate. PROCEDURE: Informed written consent was obtained from the patient after a thorough discussion of the procedural risks, benefits and alternatives. All questions were addressed. Maximal Sterile Barrier Technique was utilized including caps, mask, sterile gowns, sterile gloves, sterile drape, hand hygiene and skin antiseptic. A timeout was performed prior to the initiation of the procedure. The patient was placed supine on the exam table. Limited CT of the upper abdomen was performed for planning  purposes. This again demonstrated a hypodense fluid collection in the left hepatic lobe with foci of gas, consistent with concern for abscess. Skin entry site was marked, and the overlying skin was prepped and draped in the standard sterile fashion. Local analgesia was obtained with 1% lidocaine. Using intermittent CT fluoroscopy, a 19 gauge Yueh needle was advanced into the abscess identified in the left hepatic lobe. Location was confirmed with CT and return of purulent material. Subsequently, over an Amplatz wire, the subcutaneous  tract was dilated, followed by placement of a 12 French locking multipurpose drainage catheter. Location of the catheter within the abscess was confirmed with CT. Approximately 15 mL of thick purulent material was aspirated, with a sample sent to the lab for analysis. The catheter was secured to the skin using silk suture. A clean dressing was placed. The drainage catheter was attached to bulb suction. The patient tolerated the procedure well without immediate complication. IMPRESSION: Successful CT-guided placement of a 12 French locking drainage catheter within the liver abscess in the left hepatic lobe. Approximately 15 mL of purulent material was aspirated from the abscess cavity, with a sample sent to the lab for analysis. Electronically Signed   By: Albin Felling M.D.   On: 08/07/2021 16:20   US Abdomen Limited RUQ (LIVER/GB)  Result Date: 08/04/2021 CLINICAL DATA:  Liver abscesses. EXAM: ULTRASOUND ABDOMEN LIMITED RIGHT UPPER QUADRANT COMPARISON:  MRCP, dated July 30, 2021 FINDINGS: Gallbladder: A 2.4 cm gallstone is seen within the dependent portion of the gallbladder lumen. The gallbladder wall measures 7.2 mm in thickness. No sonographic Murphy sign noted by sonographer. Common bile duct: Diameter: 5.8 mm Liver: A 0.9 cm x 0.6 cm x 1.0 cm complex hypoechoic lesion is seen within the medial aspect of the left lobe of the liver. Additional, similar appearing 2.1 cm x 1.6 cm x 1.4 cm and 0.8 cm x 0.7 cm x 0.7 cm lesions are seen within the lateral aspect of the left lobe. Within normal limits in parenchymal echogenicity. Nonocclusive thrombus is seen within the left portal vein and main portal vein on color Doppler imaging with normal direction of blood flow towards the liver. This is seen on the prior MRI. Other: Limited study secondary to limited ability for the patient to cooperate during the exam. A small amount of ascites is seen within the right upper quadrant and right lower quadrant.  IMPRESSION: 1. Multiple complex liver lesions consistent with the patient's history of multiple liver abscesses. 2. Ascites with subsequent gallbladder wall thickening. 3. Nonobstructive thrombus within the left portal vein and main portal vein which is seen on prior MRI. Electronically Signed   By: Virgina Norfolk M.D.   On: 08/04/2021 19:55    Labs:  CBC: Recent Labs    08/05/21 0449 08/05/21 1534 08/06/21 0804 08/06/21 1502 08/07/21 0429 08/08/21 0454  WBC 13.4*  --  12.2*  --  10.2 8.4  HGB 10.0*   < > 10.3* 9.5* 9.3* 8.9*  HCT 33.1*   < > 32.8* 28.0* 29.1* 28.1*  PLT 268  --  264  --  242 211   < > = values in this interval not displayed.    COAGS: Recent Labs    07/25/21 1647 07/30/21 2012 08/05/21 0449 08/07/21 0430  INR 1.3* 1.4* 1.2 1.2  APTT 26 29  --   --     BMP: Recent Labs    12/18/20 0953 02/04/21 0959 04/18/21 1353 08/06/21 0000 08/06/21 0500 08/06/21 1502 08/07/21 0429  08/08/21 0454  NA 137 136   < > 137 135 134* 135 135  K 4.3 4.7   < > 4.1 3.9 4.1 3.9 3.8  CL 99 98   < > 99 98  --  99 101  CO2 27 28   < > 34* 34*  --  32 29  GLUCOSE 367* 474*   < > 150* 143*  --  133* 142*  BUN 12 12   < > 23 22  --  27* 27*  CALCIUM 9.6 9.9   < > 8.0* 7.9*  --  7.9* 7.3*  CREATININE 0.69* 0.76   < > 0.58* 0.53*  --  0.61 0.48*  GFRNONAA 96 91   < > >60 >60  --  >60 >60  GFRAA 111 106  --   --   --   --   --   --    < > = values in this interval not displayed.    LIVER FUNCTION TESTS: Recent Labs    08/02/21 0347 08/03/21 0304 08/04/21 0223 08/07/21 0429  BILITOT 0.9 0.6 0.5 0.2*  AST 21 21 25 25   ALT 24 19 18 16   ALKPHOS 133* 110 114 127*  PROT 5.1* 4.5* 5.1* 5.1*  ALBUMIN <1.5* <1.5* <1.5* <1.5*    Assessment and Plan:  70 y/o M with history of ruptured pyogenic liver abscesses s/p two 19 Fr blake drain placement and partial omentectomy 11/18 who underwent 12 Fr pigtail drain placement within the left hepatic lobe abscess 12/1 in IR seen  today for drain follow up.  WBC 8.4, hgb 8.9, plt 211. Preliminary culture (+) enterococcus faecalis. Currently receiving Rocephin 2g Q24H + Flagyl 500 mg Q12H per primary team.  Drain Location: RUQ Size: Fr size: 12 Fr Date of placement: 08/07/21  Currently to: Drain collection device: suction bulb 24 hour output:  Output by Drain (mL) 08/06/21 0701 - 08/06/21 1900 08/06/21 1901 - 08/07/21 0700 08/07/21 0701 - 08/07/21 1900 08/07/21 1901 - 08/08/21 0700 08/08/21 0701 - 08/08/21 1235  Closed System Drain 1 Medial;Right RUQ Bulb (JP) 19 Fr. 10  0    Closed System Drain 2 Lateral LUQ Bulb (JP) 19 Fr. 20  5    Closed System Drain 3 Superior;Midline Abdomen Bulb (JP) 12 Fr.   20 30     Interval imaging/drain manipulation:  None.  Current examination: Flushes/aspirates easily.  Insertion site unremarkable. Suture and stat lock in place. Dressed appropriately.   Plan: Continue TID flushes with 5 cc NS. Record output Q shift. Dressing changes QD or PRN if soiled.  Call IR APP or on call IR MD if difficulty flushing or sudden change in drain output.  Repeat imaging/possible drain injection once output < 10 mL/QD (excluding flush material.)  Discharge planning: Please contact IR APP or on call IR MD prior to patient d/c to ensure appropriate follow up plans are in place. Typically patient will follow up with IR clinic 10-14 days post d/c for repeat imaging/possible drain injection. IR scheduler will contact patient with date/time of appointment. Patient will need to flush drain QD with 5 cc NS, record output QD, dressing changes every 2-3 days or earlier if soiled.   IR will continue to follow - please call with questions or concerns.  Electronically Signed: Joaquim Nam, PA-C 08/08/2021, 12:33 PM   I spent a total of 15 Minutes at the the patient's bedside AND on the patient's hospital floor or unit, greater than 50% of  which was counseling/coordinating care for liver abscess  drain.

## 2021-08-08 NOTE — Progress Notes (Addendum)
Nutrition Follow-up  DOCUMENTATION CODES:   Non-severe (moderate) malnutrition in context of chronic illness  INTERVENTION:   Recommend initiate TF via Cortrak tube Saturday:  Osmolite 1.5 at 20 ml/h, increase by 10 ml every 8 hours to goal rate of 60 ml/h (1440 ml per day) Prosource TF 45 ml QID  Provides 2320 kcal, 134 gm protein, 1097 ml free water daily  Continue TPN for now. When tolerating TF at half goal rate, recommend start weaning TPN.   NUTRITION DIAGNOSIS:   Moderate Malnutrition related to chronic illness as evidenced by mild fat depletion, moderate fat depletion, mild muscle depletion, moderate muscle depletion.  Ongoing  GOAL:   Patient will meet greater than or equal to 90% of their needs  Met with TPN  MONITOR:   Labs, Weight trends, I & O's, Skin, Diet advancement  REASON FOR ASSESSMENT:   Consult Enteral/tube feeding initiation and management (surgery ok'd trickles)  ASSESSMENT:   Pt admitted with sepsis and hepatitis 2/2 ruptured pyogenic liver abscesses. PMH includes HTN, HLD, DM, COPD, hypothyroidism, mild cognitive impairment.  12/1 CT drain left hepatic lobe abscess.  Discussed patient in ICU rounds and with RN today. Patient on HFNC this morning, now back on BiPAP.  S/P FEES with SLP this morning. Patient remains NPO d/t severe aspiration risk with decreased mentation.   Cortrak tube placed; tip is in the second portion of the duodenum. Plans to hold off on TF for today. May be able to begin TF tomorrow.   TPN at 90 ml/h is providing 2160 ml, 2215 kcal, 125 gm protein daily.  Labs reviewed.  CBG: 153-179  Medications reviewed and include Lasix, Novolog, IV Flagyl, IV Rocephin, potassium chloride, mag sulfate.  Admission weight 84 kg Current weight 91.6 kg (11/29)  I/O +14.5 L since admission Now with 3 drains, total output 55 ml x 24 hours.  Diet Order:   Diet Order             Diet NPO time specified Except for: Sips with  Meds  Diet effective now                   EDUCATION NEEDS:   No education needs have been identified at this time  Skin:  Skin Assessment: Skin Integrity Issues: Skin Integrity Issues:: Unstageable, DTI, Incisions DTI: coccyx Unstageable: bilateral knees and elbows Incisions: abdomen  Last BM:  12/2 type 6  Height:   Ht Readings from Last 1 Encounters:  07/30/21 5' 7"  (1.702 m)    Weight:   Wt Readings from Last 1 Encounters:  08/05/21 91.6 kg    BMI:  Body mass index is 31.64 kg/m.  Estimated Nutritional Needs:   Kcal:  2200-2400  Protein:  125-145 grams  Fluid:  >2L    Lucas Mallow, RD, LDN, CNSC Please refer to Amion for contact information.

## 2021-08-08 NOTE — Progress Notes (Signed)
Physical Therapy Treatment Patient Details Name: DYMOND SPREEN MRN: [id number] DOB: [birth year] Today's Date: 08/08/2021   History of Present Illness 70 yo admitted 11/18 after found down at home with AMS. Pt with sepsis due to ruptured liver abscess s/p ex lap with drainage same date. Pt intubated 11/18-11/19.  11/29 with respiratory failure, now on/off Bipap.  12/1 on HHFNC. PMhx: DM, HTN, COPD, mild cognitive impairment    PT Comments    Pt lethargic on bipap on arrival awakened with sternal rub. Pt without significant command following with lack of orientation and awareness throughout session. RT transitioned to [facility] prior to transition to EOb at 20L, 70% FiO2 with pt saturations 88-95% during activity. Pt significantly limited by confusion and inability to fully participate this session. With return to supine pt resistant to knee flexion and total +2 to slide toward [facility] with bed placed in chair position.    Recommendations for follow up therapy are one component of a multi-disciplinary discharge planning process, led by the attending physician.  Recommendations may be updated based on patient status, additional functional criteria and insurance authorization.  Follow Up Recommendations  Skilled nursing-short term rehab (<3 hours/day)     Assistance Recommended at Discharge Frequent or [place] [facility] bed;Wheelchair (measurements PT);Wheelchair cushion (measurements PT);Other (comment)    Recommendations for Other Services       Precautions / Restrictions Precautions Precautions: Fall;Other (comment) Precaution Comments: watch sats, bipap/HHFNC, bil mittens     Mobility  Bed Mobility Overal bed mobility: Needs Assistance Bed Mobility: Rolling;Sidelying to Sit;Sit to Supine Rolling: Max assist;+2 for physical assistance Sidelying to sit: Max assist;+2 for physical assistance   Sit to supine: Total assist;+2 for physical  assistance   General bed mobility comments: max +2 to roll, rise and sit with posterior left lean sitting EOB. Return to supine without assist from pt. pt unable to maintain sitting without max assist with posterior left lean    Transfers                   General transfer comment: unable    Ambulation/Gait                   Stairs             Wheelchair Mobility    Modified Rankin (Stroke Patients Only)       Balance Overall balance assessment: Needs assistance   Sitting balance-Leahy Scale: Zero Sitting balance - Comments: max assist EOB grossly 5 min with cues and facilitation for weight shift and reaching but pt not following commands Postural control: Posterior lean;Left lateral lean                                  Cognition Arousal/Alertness: Lethargic Behavior During Therapy: Flat affect Overall Cognitive Status: Impaired/Different from baseline Area of Impairment: Orientation;Attention;Following commands;Safety/judgement;Problem solving;Memory;Awareness                 Orientation Level: Disoriented to;Place;Time;Situation Current Attention Level: Focused Memory: Decreased recall of precautions;Decreased short-term memory Following Commands: Follows one step commands inconsistently;Follows one step commands with increased time Safety/Judgement: Decreased awareness of safety;Decreased awareness of deficits   Problem Solving: Slow processing;Requires verbal cues;Requires tactile cues;Decreased initiation;Difficulty sequencing General Comments: pt not responding to orientation questions, not significantly responding to commands and responds "hey baby" or "yeah" to all cues and questions  Exercises General Exercises - Lower Extremity Short Arc Quad: PROM;Both;5 reps;Seated    General Comments        Pertinent Vitals/Pain Pain Assessment: CPOT Body Movements: Absence of movements Muscle Tension: Tense,  rigid Compliance with ventilator (intubated pts.): N/A Vocalization (extubated pts.): Talking in normal tone or no sound    Home Living                          Prior Function            PT Goals (current goals can now be found in the care plan section) Progress towards PT goals: Not progressing toward goals - comment    Frequency    Min 2X/week      PT Plan Current plan remains appropriate    Co-evaluation              AM-PAC PT "6 Clicks" Mobility   Outcome Measure  Help needed turning from your back to your side while in a flat bed without using bedrails?: Total Help needed moving from lying on your back to sitting on the side of a flat bed without using bedrails?: Total Help needed moving to and from a bed to a chair (including a wheelchair)?: Total Help needed standing up from a chair using your arms (e.g., wheelchair or bedside chair)?: Total Help needed to walk in hospital room?: Total Help needed climbing 3-5 steps with a railing? : Total 6 Click Score: 6    End of Session Equipment Utilized During Treatment: Oxygen Activity Tolerance: Patient limited by fatigue Patient left: in bed;with call bell/phone within reach;with bed alarm set Nurse Communication: Mobility status;Need for lift equipment PT Visit Diagnosis: Other abnormalities of gait and mobility (R26.89);Muscle weakness (generalized) (M62.81)     Time: 4818-5631 PT Time Calculation (min) (ACUTE ONLY): 20 min  Charges:  $Therapeutic Activity: 8-22 mins                     Jahki Witham P, PT Acute Rehabilitation Services Pager: [phone] Office: [place] Arnet[place] Odeh 08/08/2021, 9:26 AM

## 2021-08-08 NOTE — Progress Notes (Signed)
ANTICOAGULATION CONSULT NOTE  Pharmacy Consult for Heparin Indication: Portal Vein Thrombosis   No Known Allergies  Patient Measurements: Height: 5\' 7"  (170.2 cm) Weight: 91.6 kg (202 lb) IBW/kg (Calculated) : 66.1 Heparin Dosing Weight: 73 kg   Vital Signs: Temp: 98.3 F (36.8 C) (12/02 0713) Temp Source: Axillary (12/02 0713) BP: 113/55 (12/02 0200) Pulse Rate: 80 (12/02 0721)  Labs: Recent Labs    08/06/21 0500 08/06/21 0804 08/06/21 1502 08/07/21 0429 08/07/21 0430 08/08/21 0454 08/08/21 0455  HGB  --  10.3* 9.5* 9.3*  --  8.9*  --   HCT  --  32.8* 28.0* 29.1*  --  28.1*  --   PLT  --  264  --  242  --  211  --   LABPROT  --   --   --   --  15.6*  --   --   INR  --   --   --   --  1.2  --   --   HEPARINUNFRC 0.46  --   --  0.46  --   --  0.41  CREATININE 0.53*  --   --  0.61  --  0.48*  --      Estimated Creatinine Clearance: 90.1 mL/min (A) (by C-G formula based on SCr of 0.48 mg/dL (L)).   Assessment: 70 yo male presented on 07/25/2021 after being found down with AMS found to have perforated liver abscess s/p partial omentectomy and drain placement. MRI with suspected portal vein thrombosis. Results of MRI dicussed with CCM and surgery. Pharmacy consulted to dose heparin.   Heparin level therapeutic at 0.41 on infusion at 2450 units/hr. No bleeding or IV issues noted.   Goal of Therapy:  Heparin level 0.3-0.7 units/ml Monitor platelets by anticoagulation protocol: Yes   Plan:  Continue heparin infusion at 2450 units/hr  Daily heparin level and CBC ordered Drain placed 12/1 Follow up long term anticoagulation plan post drain  Thank you for allowing pharmacy to be a part of this patient's care.  Alanda Slim, PharmD, [facility] Clinical Pharmacist Please see AMION for all Pharmacists' Contact Phone Numbers 08/08/2021, 7:34 AM

## 2021-08-08 NOTE — Progress Notes (Signed)
Called RT to place pt on BI-PAP, patient sleepy and refuses to stay awake.
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
## 2021-08-08 NOTE — Progress Notes (Addendum)
14 Days Post-Op  Subjective: CC: S/p IR drainage yesterday. Evaluated by speech this am and still recommended for NPO. On BiPAP overnight. Off BiPAP this am. Reports some upper abdominal pain. No n/v.   Objective: Vital signs in last 24 hours: Temp:  [97.7 F (36.5 C)-98.7 F (37.1 C)] 98.3 F (36.8 C) (12/02 0713) Pulse Rate:  [74-114] 100 (12/02 0900) Resp:  [16-25] 22 (12/02 0900) BP: (105-161)/(51-69) 116/53 (12/02 0900) SpO2:  [90 %-100 %] 91 % (12/02 0900) FiO2 (%):  [50 %-80 %] 70 % (12/02 0837) Last BM Date: 08/08/21  Intake/Output from previous day: 12/01 0701 - 12/02 0700 In: 3023.3 [I.V.:2613.1; IV Piggyback:400.3] Out: 1980 [ITGPQ:9826; Drains:55] Intake/Output this shift: Total I/O In: 228.8 [I.V.:228.8] Out: -   PE: Gen:  Awake and alert, on HFNC Abd: Distended but soft, some tenderness around incision and drains. +bowel sounds. RUQ drain with SS fluid in gravity bag without gross bile. LUQ drain with serous fluid in bulb without gross bile. IR drain with scant SS fluid. Psych: A&Ox2  Lab Results:  Recent Labs    08/07/21 0429 08/08/21 0454  WBC 10.2 8.4  HGB 9.3* 8.9*  HCT 29.1* 28.1*  PLT 242 211   BMET Recent Labs    08/07/21 0429 08/08/21 0454  NA 135 135  K 3.9 3.8  CL 99 101  CO2 32 29  GLUCOSE 133* 142*  BUN 27* 27*  CREATININE 0.61 0.48*  CALCIUM 7.9* 7.3*   PT/INR Recent Labs    08/07/21 0430  LABPROT 15.6*  INR 1.2   CMP     Component Value Date/Time   NA 135 08/08/2021 0454   NA 142 01/15/2016 1031   K 3.8 08/08/2021 0454   CL 101 08/08/2021 0454   CO2 29 08/08/2021 0454   GLUCOSE 142 (H) 08/08/2021 0454   BUN 27 (H) 08/08/2021 0454   BUN 10 01/15/2016 1031   CREATININE 0.48 (L) 08/08/2021 0454   CREATININE 0.74 04/18/2021 1353   CALCIUM 7.3 (L) 08/08/2021 0454   PROT 5.1 (L) 08/07/2021 0429   PROT 6.2 01/15/2016 1031   ALBUMIN <1.5 (L) 08/07/2021 0429   ALBUMIN 4.1 01/15/2016 1031   AST 25 08/07/2021 0429    ALT 16 08/07/2021 0429   ALKPHOS 127 (H) 08/07/2021 0429   BILITOT 0.2 (L) 08/07/2021 0429   BILITOT 0.3 01/15/2016 1031   GFRNONAA >60 08/08/2021 0454   GFRNONAA 91 02/04/2021 0959   GFRAA 106 02/04/2021 0959   Lipase     Component Value Date/Time   LIPASE 36 07/27/2021 0420    Studies/Results: CT ABDOMEN PELVIS W CONTRAST  Result Date: 08/06/2021 CLINICAL DATA:  Abdominal abscess/infection suspected. EXAM: CT ABDOMEN AND PELVIS WITH CONTRAST TECHNIQUE: Multidetector CT imaging of the abdomen and pelvis was performed using the standard protocol following bolus administration of intravenous contrast. CONTRAST:  134mL OMNIPAQUE IOHEXOL 350 MG/ML SOLN COMPARISON:  Ultrasound 2 days ago.  CT 07/25/2021. FINDINGS: Lower chest: Bilateral pleural effusions layering dependently. Atelectasis of both lower lobes, more extensive on the left than the right. Hepatobiliary: Diminishing areas of low-density and air within the left lobe of the liver consistent with improving abscesses. Surgical drain between the left lobe of the liver and the diaphragm, with only a small amount of fluid present along the superior margin of the liver. Calcified gallstones dependent in the gallbladder as seen previously. Ongoing thrombosis of the left portal vein, with a small amount of thrombus in the right main portal  vein. Pancreas: Normal Spleen: Normal Adrenals/Urinary Tract: Adrenal glands are normal. Kidneys are normal. Catheter in the bladder. Stomach/Bowel: Stomach and small intestine appear normal. Some fluid levels in the colon suggesting diarrhea. No focal colon pathology identified. Vascular/Lymphatic: Aortic atherosclerosis. No aneurysm. IVC is normal. No adenopathy. Reproductive: Enlarged prostate. Other: Increase in the amount of free intraperitoneal fluid without focal loculation. Musculoskeletal: Chronic degenerative changes affect the spine. Chronic bilateral pars defects at L5. IMPRESSION: Bilateral pleural  effusions layering dependently with lower lobe atelectasis left worse than right. Surgical drain in place extending between the dome of the liver and the diaphragm. Diminishing areas of low-density and air within the left lobe of the liver consistent with improving liver abscesses. Continued demonstration of thrombosis of the left portal vein and partial thrombosis of the right portal vein. Increase in amount of generalized ascites. Chololithiasis as seen previously. Electronically Signed   By: Nelson Chimes M.D.   On: 08/06/2021 13:09   DG Chest Port 1 View  Result Date: 08/08/2021 CLINICAL DATA:  Respiratory failure. EXAM: PORTABLE CHEST 1 VIEW COMPARISON:  August 07, 2021. FINDINGS: Stable cardiomediastinal silhouette. Right lung is clear. Slightly improved left basilar atelectasis or pneumonia is noted. Right-sided PICC line is again noted. No pneumothorax is noted. Bony thorax is unremarkable. IMPRESSION: Slightly improved left basilar atelectasis or infiltrate. Electronically Signed   By: Marijo Conception M.D.   On: 08/08/2021 09:18   DG CHEST PORT 1 VIEW  Result Date: 08/07/2021 CLINICAL DATA:  Respiratory failure EXAM: PORTABLE CHEST 1 VIEW COMPARISON:  08/06/2021 FINDINGS: Progression of left lower lobe airspace consolidation as well as left effusion. Right lung remains clear. Negative for heart failure or edema Right arm PICC tip in the SVC unchanged. IMPRESSION: Progressive left lower lobe airspace disease and left effusion. Possible pneumonia. Electronically Signed   By: Franchot Gallo M.D.   On: 08/07/2021 15:43   CT IMAGE GUIDED DRAINAGE BY PERCUTANEOUS CATHETER  Result Date: 08/07/2021 INDICATION: Hepatic abscess EXAM: CT-guided intra-abdominal drain placement into hepatic abscess MEDICATIONS: The patient is currently admitted to the hospital and receiving intravenous antibiotics. The antibiotics were administered within an appropriate time frame prior to the initiation of the procedure.  ANESTHESIA/SEDATION: Moderate (conscious) sedation was employed during this procedure. A total of Versed 2.5 mg and Fentanyl 75 mcg was administered intravenously by the radiology nurse. Total intra-service moderate Sedation Time: 21 minutes. The patient's level of consciousness and vital signs were monitored continuously by radiology nursing throughout the procedure under my direct supervision. COMPLICATIONS: None immediate. PROCEDURE: Informed written consent was obtained from the patient after a thorough discussion of the procedural risks, benefits and alternatives. All questions were addressed. Maximal Sterile Barrier Technique was utilized including caps, mask, sterile gowns, sterile gloves, sterile drape, hand hygiene and skin antiseptic. A timeout was performed prior to the initiation of the procedure. The patient was placed supine on the exam table. Limited CT of the upper abdomen was performed for planning purposes. This again demonstrated a hypodense fluid collection in the left hepatic lobe with foci of gas, consistent with concern for abscess. Skin entry site was marked, and the overlying skin was prepped and draped in the standard sterile fashion. Local analgesia was obtained with 1% lidocaine. Using intermittent CT fluoroscopy, a 19 gauge Yueh needle was advanced into the abscess identified in the left hepatic lobe. Location was confirmed with CT and return of purulent material. Subsequently, over an Amplatz wire, the subcutaneous tract was dilated, followed by placement  of a 12 French locking multipurpose drainage catheter. Location of the catheter within the abscess was confirmed with CT. Approximately 15 mL of thick purulent material was aspirated, with a sample sent to the lab for analysis. The catheter was secured to the skin using silk suture. A clean dressing was placed. The drainage catheter was attached to bulb suction. The patient tolerated the procedure well without immediate complication.  IMPRESSION: Successful CT-guided placement of a 12 French locking drainage catheter within the liver abscess in the left hepatic lobe. Approximately 15 mL of purulent material was aspirated from the abscess cavity, with a sample sent to the lab for analysis. Electronically Signed   By: Albin Felling M.D.   On: 08/07/2021 16:20    Anti-infectives: Anti-infectives (From admission, onward)    Start     Dose/Rate Route Frequency Ordered Stop   07/29/21 1800  metroNIDAZOLE (FLAGYL) IVPB 500 mg        500 mg 100 mL/hr over 60 Minutes Intravenous Every 12 hours 07/29/21 0930     07/29/21 1400  cefTRIAXone (ROCEPHIN) 2 g in sodium chloride 0.9 % 100 mL IVPB        2 g 200 mL/hr over 30 Minutes Intravenous Every 24 hours 07/29/21 0945     07/26/21 1400  ceFEPIme (MAXIPIME) 2 g in sodium chloride 0.9 % 100 mL IVPB  Status:  Discontinued        2 g 200 mL/hr over 30 Minutes Intravenous Every 8 hours 07/26/21 0721 07/29/21 0945   07/26/21 0800  ceFEPIme (MAXIPIME) 2 g in sodium chloride 0.9 % 100 mL IVPB  Status:  Discontinued        2 g 200 mL/hr over 30 Minutes Intravenous Every 12 hours 07/25/21 1853 07/26/21 0213   07/26/21 0600  metroNIDAZOLE (FLAGYL) IVPB 500 mg  Status:  Discontinued        500 mg 100 mL/hr over 60 Minutes Intravenous Every 8 hours 07/26/21 0119 07/29/21 0930   07/26/21 0600  ceFEPIme (MAXIPIME) 2 g in sodium chloride 0.9 % 100 mL IVPB  Status:  Discontinued        2 g 200 mL/hr over 30 Minutes Intravenous Every 12 hours 07/26/21 0225 07/26/21 0721   07/25/21 1900  ceFEPIme (MAXIPIME) 2 g in sodium chloride 0.9 % 100 mL IVPB        2 g 200 mL/hr over 30 Minutes Intravenous  Once 07/25/21 1845 07/25/21 2015   07/25/21 1900  metroNIDAZOLE (FLAGYL) IVPB 500 mg        500 mg 100 mL/hr over 60 Minutes Intravenous  Once 07/25/21 1845 07/25/21 2138        Assessment/Plan POD 15 s/p Exploratory laparotomy with drainage of pyogenic liver abscess x 2, Partial omentectomy for  Ruptured pyogenic liver abscesses by Dr. Dema Severin - 07/25/2021 - Cont IV abx. Appreciate ID's assistance. On Rocephin/Flagyl. MRCP w/ portal vein thrombosis extending into portal venous bifurcation with multiple hepatic abscesses. They suspect liver abscess are complication of pylephlebitis. ID recommending 4 week of abx. On heparin gtt. RUQ [place] 11/28 w/ multiple complex liver lesions measuring < 3 cm c/w mult liver abscesses; ascites with subsequent GB wall thickening; and nonobstructive thrombus within the left portal vein and main portal vein. GI following. - CT A/P 08/06/21 w/ interval improvement of liver abscesses, continue surgical drain. Monitor output. High output from RUQ drain has gone down since switching to gravity bag. Suspect high output was from ascites.  Drain output remains SS  without gross bile.  - S/p IR aspiration and drainage of liver fluid collections 12/1. Cx's pending.  - LFT's normalized - Patient with ROBF. Was on CLD w/o emesis. Seen by speech with some concern for aspiration (subtle coughing after consumption of thin liquids). Made NPO. ADAT per SLP recommendations. They are currently recommending NPO. When off BiPAP could consider cortrak and starting TF's if remains NPO - spoke with CCM attending. They anticipate BiPAP need over the next 24 hours, so would hold off on Cortrak/TF's for now. I did reach out to CCM again to clarify, would not recommend TF's when patient is requiring BiPAP after reviewing the chart in the afternoon and seeing a cortrak had been placed. - Continue PICC/TPN till tolerating PO better. Albumin < 1.5, pre-albumin <5 - Cont WTD BID to midline - Therapies, mobilize - Pulm toilet   FEN - NPO until cleared by speech, IVF per TRH VTE - SCDs, heparin gtt ID - Rocephin/Flagyl. Foley - in place, per primary    HTN  Dementia/delirium/agitation - on Seroquel Acute respiratory failure - on BiPAP overnight. CCM following.  COPD - On scheduled duonebs.   GERD DM2 Prior hx IVDU (1970's)   LOS: 13 days    Jillyn Ledger , [facility] Surgery 08/08/2021, 10:15 AM Please see Amion for pager number during day hours 7:00am-4:30pm

## 2021-08-08 NOTE — Progress Notes (Signed)
NAME:  Matthew Dominguez, MRN:  [id number], DOB:  1951-05-19, LOS: 80 ADMISSION DATE:  07/25/2021, CONSULTATION DATE:  11/18 REFERRING MD:  Roderic Palau, CHIEF COMPLAINT:  Sepsis concern   History of Present Illness:  Matthew Dominguez is a 70 y.o. M who was initially admitted on 07/25/2021 and underwent exploratory laparotomy with drainage of pyogenic liver abscess x2 as well as partial omentectomy for ruptured pyogenic liver abscesses. MRCP 11/23 showed numerous hepatic abscesses with L portal thrombosis extending into portal venous bifurcation, possible ascites, cholelithiasis, generalized bowel edema. RUQ [place] 11/28 showed multiple complex liver lesions measuring <3 cm consistent with multiple liver abscesses, ascites. He has been followed by surgery, infectious disease, IR to this point. On 11/29 he began having worsening respiratory distress at which point PCCM was consulted for further management and concern for intubation need.  Pertinent  Medical History  Asthma, COPD, T2DM, HTN, BPH, hypothyroidism  Significant Hospital Events: Including procedures, antibiotic start and stop dates in addition to other pertinent events   11/18 went from ER to OR for exploratory laparotomy and drainage of pyogenic liver abscesses (2) and partial omentectomy 11/19 ID consulted, Extubated 11/20 Passed bedside swallow. ID consulted. 11/21 Confused, required mitts. Cleviprex gtt continues for hypertension. 11/22 Ongoing Cleviprex needs. O2 needs decreased. Still slow ROBF, consideration of TPN if unable to tolerate feeds. 11/22 cefepime discontinued, changed to ceftriaxone, metronidazole continued. 11/23 MRCP (could not complete study)> numerous hepatic abscesses with left portal vein thrombosis, fluid in abdomen may represent ascites, cholelithiasis, possible stone in cystic duct, bilatearl effusions, mild intrahepatic biliary ductal distension, generalized bowel edema 11/28 increasing O2 requirement and transitioned to  Bipap, PCCM re-consulted 08/05/2021 increasing FiO2 needs desaturations poor air movement transfer to ICU questionable intubation. 11/30 CT abd/pelvis>>> Bilateral pleural effusions layering dependently with lower lobe atelectasis left worse than right. Surgical drain in place extending between the dome of the liver and the diaphragm. Diminishing areas of low-density and air within the left lobe of the liver consistent with improving liver abscesses. Continued demonstration of thrombosis of the left portal vein and partial thrombosis of the right portal vein. Increase in amount of generalized ascites.  Chololithiasis as seen previously. 12/1 CT drain left hepatic lobe abscess  Interim History / Subjective:  On BiPAP overnight. On FIO2 70% and 25L  Objective   Blood pressure (!) 113/55, pulse 88, temperature 98.4 F (36.9 C), temperature source Axillary, resp. rate (!) 21, height 5\' 7"  (1.702 m), weight 91.6 kg, SpO2 100 %.    FiO2 (%):  [50 %-80 %] 70 %   Intake/Output Summary (Last 24 hours) at 08/08/2021 0713 Last data filed at 08/08/2021 0500 Gross per 24 hour  Intake 2434.63 ml  Output 1980 ml  Net 454.63 ml   Filed Weights   07/31/21 0417 08/04/21 0818 08/05/21 0751  Weight: 86.6 kg 97.5 kg 91.6 kg   Physical Exam: General: Critically ill-appearing, no acute distress HENT: [place], AT, on BiPAP Eyes: EOMI, no scleral icterus Respiratory: Clear to auscultation bilaterally.  No crackles, wheezing or rales Cardiovascular: RRR, -M/R/G, no JVD GI: RUQ drain x 2, midline dressing, L drain,BS+, soft Extremities: 2+ pitting edema in lower extremities,-tenderness Neuro: Drowsy, withdraws, CNII-XII grossly intact GU: Foley in place  Labs reviewed 12/2 personally by me: BMET acceptable Stable Hg 8.9  CXR 12/2 - Slightly improved left lobe aeraation Resolved Hospital Problem list     Assessment & Plan:  Acute hypoxemic and hypercarbic respiratory failure Increased FIO2 in last 24  hours. Not on BiPAP overnight CFB +14L PLAN -  - Wean HHFNC for goal SpO2 >90% - Continue BiPAP as needed and qhs for now  - Increase lasix 60 mg BID - pulmonary hygiene: mucinex - mobilize as much as able  - intermittent f/u CXR  - wean supplemental O2 as able to keep sats >90%  COPD - Continue Pulmicort - Increase Duoneb TID  Liver abscess Portal vein thrombosis POD15 s/p exp lap with drainage liver abscess, partial omentectomy CT 11/30 with interval improvement  PLAN -  ID following  Continue rocephin/flagyl - ID recommending 4 weeks total abx  IR following - continuing surgical drain for now  F/u wound culture 12/1. Pending NPO - SLP eval once ok from resp standpoint  Continue TPN  Heparin gtt   Type 2 diabetes mellitus - Novolog SSI - Semglee 10 units BID  Hypertension - Continue metoprolol, amlodipine - Labetolol 5mg  IV q6h, hydralazine 10 mg IV q6h PRN SBP>180 or DBP>100  Acute urinary retention - Doxazosin 1mg  daily  Agitation requiring sedation - Seroquel nightly  Best Practice (right click and "Reselect all SmartList Selections" daily)   Diet/type: TPN DVT prophylaxis: systemic heparin GI prophylaxis: N/A Lines: Central line Foley:  N/A Code Status:  full code Last date of multidisciplinary goals of care discussion [11/21]  Labs   CBC: Recent Labs  Lab 08/02/21 0347 08/03/21 0304 08/04/21 0223 08/05/21 0449 08/05/21 1534 08/05/21 2336 08/06/21 0804 08/06/21 1502 08/07/21 0429 08/08/21 0454  WBC 14.7*   < > 14.2* 13.4*  --   --  12.2*  --  10.2 8.4  NEUTROABS 11.5*  --   --   --   --   --   --   --   --   --   HGB 10.8*   < > 10.4* 10.0*   < > 10.2* 10.3* 9.5* 9.3* 8.9*  HCT 34.5*   < > 33.1* 33.1*   < > 30.0* 32.8* 28.0* 29.1* 28.1*  MCV 96.9   < > 99.1 98.8  --   --  99.1  --  97.0 98.3  PLT 237   < > 251 268  --   --  264  --  242 211   < > = values in this interval not displayed.    Basic Metabolic Panel: Recent Labs  Lab  08/04/21 0223 08/05/21 0449 08/05/21 1534 08/06/21 0000 08/06/21 0500 08/06/21 1502 08/07/21 0429 08/08/21 0454  NA 138 137   < > 137 135 134* 135 135  K 4.8 4.2   < > 4.1 3.9 4.1 3.9 3.8  CL 105 103  --  99 98  --  99 101  CO2 25 30  --  34* 34*  --  32 29  GLUCOSE 241* 164*  --  150* 143*  --  133* 142*  BUN 28* 28*  --  23 22  --  27* 27*  CREATININE 0.72 0.74  --  0.58* 0.53*  --  0.61 0.48*  CALCIUM 7.6* 7.9*  --  8.0* 7.9*  --  7.9* 7.3*  MG 1.8 1.7  --  1.8 1.8  --  2.3 1.8  PHOS 3.6 4.0  --   --  4.0  --  4.5 3.5   < > = values in this interval not displayed.   GFR: Estimated Creatinine Clearance: 90.1 mL/min (A) (by C-G formula based on SCr of 0.48 mg/dL (L)). Recent Labs  Lab 08/05/21 0449 08/06/21 0804  08/07/21 0429 08/08/21 0454  WBC 13.4* 12.2* 10.2 8.4    Liver Function Tests: Recent Labs  Lab 08/02/21 0347 08/03/21 0304 08/04/21 0223 08/07/21 0429  AST 21 21 25 25   ALT 24 19 18 16   ALKPHOS 133* 110 114 127*  BILITOT 0.9 0.6 0.5 0.2*  PROT 5.1* 4.5* 5.1* 5.1*  ALBUMIN <1.5* <1.5* <1.5* <1.5*   No results for input(s): LIPASE, AMYLASE in the last 168 hours. No results for input(s): AMMONIA in the last 168 hours.  ABG    Component Value Date/Time   PHART 7.506 (H) 08/06/2021 1502   PCO2ART 46.1 08/06/2021 1502   PO2ART 55 (L) 08/06/2021 1502   HCO3 36.3 (H) 08/06/2021 1502   TCO2 38 (H) 08/06/2021 1502   ACIDBASEDEF 1.0 07/26/2021 0039   O2SAT 90.0 08/06/2021 1502     Coagulation Profile: Recent Labs  Lab 08/05/21 0449 08/07/21 0430  INR 1.2 1.2    Cardiac Enzymes: No results for input(s): CKTOTAL, CKMB, CKMBINDEX, TROPONINI in the last 168 hours.  HbA1C: Hgb A1c MFr Bld  Date/Time Value Ref Range Status  04/18/2021 01:53 PM 12.2 (H) <5.7 % of total Hgb Final    Comment:    For someone without known diabetes, a hemoglobin A1c value of 6.5% or greater indicates that they may have  diabetes and this should be confirmed with a  follow-up  test. . For someone with known diabetes, a value <7% indicates  that their diabetes is well controlled and a value  greater than or equal to 7% indicates suboptimal  control. A1c targets should be individualized based on  duration of diabetes, age, comorbid conditions, and  other considerations. . Currently, no consensus exists regarding use of hemoglobin A1c for diagnosis of diabetes for children. Marland Kitchen   12/18/2020 09:53 AM 12.5 (H) <5.7 % of total Hgb Final    Comment:    For someone without known diabetes, a hemoglobin A1c value of 6.5% or greater indicates that they may have  diabetes and this should be confirmed with a follow-up  test. . For someone with known diabetes, a value <7% indicates  that their diabetes is well controlled and a value  greater than or equal to 7% indicates suboptimal  control. A1c targets should be individualized based on  duration of diabetes, age, comorbid conditions, and  other considerations. . Currently, no consensus exists regarding use of hemoglobin A1c for diagnosis of diabetes for children. .     CBG: Recent Labs  Lab 08/07/21 1059 08/07/21 1544 08/07/21 1912 08/07/21 2309 08/08/21 0351  GLUCAP 155* 127* 135* 122* 128*     Critical care time:    The patient is critically ill with multiple organ systems failure and requires high complexity decision making for assessment and support, frequent evaluation and titration of therapies, application of advanced monitoring technologies and extensive interpretation of multiple databases.  Independent Critical Care Time: 4 Minutes.   [name], M.D. [facility] Pulmonary/Critical Care Medicine 08/08/2021 7:13 AM   Please see Amion for pager number to reach on-call Pulmonary and Critical Care Team.

## 2021-08-08 NOTE — Progress Notes (Signed)
Tried to encourage patient to do flutter.  Put to his lips but he gave a very poor effort.  Patient only tried x4.

## 2021-08-09 DIAGNOSIS — A419 Sepsis, unspecified organism: Secondary | ICD-10-CM | POA: Diagnosis not present

## 2021-08-09 DIAGNOSIS — R6520 Severe sepsis without septic shock: Secondary | ICD-10-CM | POA: Diagnosis not present

## 2021-08-09 LAB — BASIC METABOLIC PANEL
Anion gap: 4 — ABNORMAL LOW (ref 5–15)
Anion gap: 7 (ref 5–15)
BUN: 27 mg/dL — ABNORMAL HIGH (ref 8–23)
BUN: 22 mg/dL (ref 8–23)
CO2: 32 mmol/L (ref 22–32)
CO2: 30 mmol/L (ref 22–32)
Calcium: 7.8 mg/dL — ABNORMAL LOW (ref 8.9–10.3)
Calcium: 8 mg/dL — ABNORMAL LOW (ref 8.9–10.3)
Chloride: 99 mmol/L (ref 98–111)
Chloride: 97 mmol/L — ABNORMAL LOW (ref 98–111)
Creatinine, Ser: 0.54 mg/dL — ABNORMAL LOW (ref 0.61–1.24)
Creatinine, Ser: 0.56 mg/dL — ABNORMAL LOW (ref 0.61–1.24)
GFR, Estimated: >60 mL/min (ref >60)
GFR, Estimated: >60 mL/min (ref >60)
Glucose, Bld: 92 mg/dL (ref 70–99)
Glucose, Bld: 144 mg/dL — ABNORMAL HIGH (ref 70–99)
Potassium: 4 mmol/L (ref 3.5–5.1)
Potassium: 3.8 mmol/L (ref 3.5–5.1)
Sodium: 135 mmol/L (ref 135–145)
Sodium: 134 mmol/L — ABNORMAL LOW (ref 135–145)

## 2021-08-09 LAB — CBC
HCT: 28 % — ABNORMAL LOW (ref 39.0–52.0)
Hemoglobin: 9 g/dL — ABNORMAL LOW (ref 13.0–17.0)
MCH: 31.1 pg (ref 26.0–34.0)
MCHC: 32.1 g/dL (ref 30.0–36.0)
MCV: 96.9 fL (ref 80.0–100.0)
Platelets: 206 10*3/uL (ref 150–400)
RBC: 2.89 MIL/uL — ABNORMAL LOW (ref 4.22–5.81)
RDW: 15.4 % (ref 11.5–15.5)
WBC: 8.6 10*3/uL (ref 4.0–10.5)
nRBC: 0 % (ref 0.0–0.2)

## 2021-08-09 LAB — GLUCOSE, CAPILLARY
Glucose-Capillary: 95 mg/dL (ref 70–99)
Glucose-Capillary: 100 mg/dL — ABNORMAL HIGH (ref 70–99)
Glucose-Capillary: 115 mg/dL — ABNORMAL HIGH (ref 70–99)
Glucose-Capillary: 122 mg/dL — ABNORMAL HIGH (ref 70–99)
Glucose-Capillary: 128 mg/dL — ABNORMAL HIGH (ref 70–99)
Glucose-Capillary: 131 mg/dL — ABNORMAL HIGH (ref 70–99)

## 2021-08-09 LAB — CULTURE, BLOOD (ROUTINE X 2)
Culture: NO GROWTH
Culture: NO GROWTH
Special Requests: ADEQUATE
Special Requests: ADEQUATE

## 2021-08-09 LAB — PHOSPHORUS: Phosphorus: 3.6 mg/dL (ref 2.5–4.6)

## 2021-08-09 LAB — MAGNESIUM: Magnesium: 1.9 mg/dL (ref 1.7–2.4)

## 2021-08-09 LAB — HEPARIN LEVEL (UNFRACTIONATED): Heparin Unfractionated: 0.32 IU/mL (ref 0.30–0.70)

## 2021-08-09 MED ORDER — OSMOLITE 1.5 CAL PO LIQD
1000.0000 mL | ORAL | Status: DC
  Administered 2021-08-09: 1000 mL
  Filled 2021-08-09 (×3): qty 1000

## 2021-08-09 MED ORDER — TRAVASOL 10 % IV SOLN
INTRAVENOUS | Status: AC
  Filled 2021-08-09: qty 1252.8

## 2021-08-09 MED ORDER — AMPICILLIN-SULBACTAM SODIUM 3 (2-1) G IJ SOLR
3.0000 g | Freq: Three times a day (TID) | INTRAMUSCULAR | Status: DC
Start: 2021-08-09 — End: 2021-08-13
  Administered 2021-08-09 – 2021-08-13 (×12): 3 g via INTRAVENOUS
  Filled 2021-08-09 (×12): qty 8

## 2021-08-09 MED ORDER — GUAIFENESIN 100 MG/5ML PO LIQD
15.0000 mL | ORAL | Status: DC
  Administered 2021-08-09 – 2021-08-14 (×27): 15 mL
  Filled 2021-08-09 (×25): qty 15

## 2021-08-09 NOTE — Progress Notes (Signed)
[facility] for Infectious Disease   Reason for visit: Follow up on liver abscess  Interval History: culture now with Enterococcus faecalis; he feels well, no complaints.  No rash.  WBC 8.6.    Day 16 antibiotics  Day 12 ceftriaxone and metronidazole  Physical Exam: Constitutional:  Vitals:   08/09/21 1200 08/09/21 1300  BP: (!) 119/49 (!) 123/47  Pulse: 88 93  Resp: (!) 23 20  Temp: 98.2 F (36.8 C)   SpO2: 94% 91%   patient appears in NAD Respiratory: Normal respiratory effort; CTA B Cardiovascular: RRR GI: soft, nt, nd  Review of Systems: Constitutional: negative for fevers and chills Gastrointestinal: negative for nausea and diarrhea  Lab Results  Component Value Date   WBC 8.6 08/09/2021   HGB 9.0 (L) 08/09/2021   HCT 28.0 (L) 08/09/2021   MCV 96.9 08/09/2021   PLT 206 08/09/2021    Lab Results  Component Value Date   CREATININE 0.54 (L) 08/09/2021   BUN 27 (H) 08/09/2021   NA 135 08/09/2021   K 4.0 08/09/2021   CL 99 08/09/2021   CO2 32 08/09/2021    Lab Results  Component Value Date   ALT 16 08/07/2021   AST 25 08/07/2021   ALKPHOS 127 (H) 08/07/2021     Microbiology: Recent Results (from the past 240 hour(s))  Culture, blood (routine x 2)     Status: None   Collection Time: 08/04/21 12:28 PM   Specimen: BLOOD LEFT FOREARM  Result Value Ref Range Status   Specimen Description BLOOD LEFT FOREARM  Final   Special Requests   Final    BOTTLES DRAWN AEROBIC AND ANAEROBIC Blood Culture adequate volume   Culture   Final    NO GROWTH 5 DAYS Performed at [facility], [address], [place], [place] [address]    Report Status 08/09/2021 FINAL  Final  Culture, blood (routine x 2)     Status: None   Collection Time: 08/04/21 12:38 PM   Specimen: BLOOD LEFT WRIST  Result Value Ref Range Status   Specimen Description BLOOD LEFT WRIST  Final   Special Requests   Final    BOTTLES DRAWN AEROBIC AND ANAEROBIC Blood Culture adequate volume    Culture   Final    NO GROWTH 5 DAYS Performed at [facility], [address] [place], [place] [address]    Report Status 08/09/2021 FINAL  Final  Urine Culture     Status: None   Collection Time: 08/04/21  3:03 PM   Specimen: Urine, Clean Catch  Result Value Ref Range Status   Specimen Description URINE, CLEAN CATCH  Final   Special Requests NONE  Final   Culture   Final    NO GROWTH Performed at [facility], [place] [address] [place], [place] [address]    Report Status 08/05/2021 FINAL  Final  Aerobic/Anaerobic Culture w Gram Stain (surgical/deep wound)     Status: None (Preliminary result)   Collection Time: 08/07/21  2:55 PM   Specimen: Wound  Result Value Ref Range Status   Specimen Description WOUND  Final   Special Requests  ABCESS  Final   Gram Stain   Final    NO SQUAMOUS EPITHELIAL CELLS SEEN ABUNDANT WBC SEEN NO ORGANISMS SEEN Performed at [facility], [place] [address], [place], [place] [address]    Culture   Final    FEW ENTEROCOCCUS FAECALIS SUSCEPTIBILITIES TO FOLLOW NO ANAEROBES  ISOLATED; CULTURE IN PROGRESS FOR 5 DAYS    Report Status PENDING  Incomplete    Impression/Plan:  1. Liver abscess - drainage done on 12/1 and now culture with Enterococcus.  He otherwise is doing well overall.  Based on the new culture, I will change his antibiotics to ampicillin/sulbactam which will better cover new finding.    2.  Leukocytosis - now improved after source control with IR drainage and will continue to monitor.    3.  Pyelophlebitis - on heparin and antibiotics and will continue this.  No changes.  Will continue to monitor.

## 2021-08-09 NOTE — Progress Notes (Signed)
PHARMACY - TOTAL PARENTERAL NUTRITION CONSULT NOTE   Indication: Prolonged ileus  Patient Measurements: Height: 5\' 7"  (170.2 cm) Weight: 91.1 kg (200 lb 13.4 oz) IBW/kg (Calculated) : 66.1 TPN AdjBW (KG): 73.7 Body mass index is 31.46 kg/m.  Assessment: 29 yom with perforated liver abscess s/p partial omentectomy and drains in the right and left upper quadrants.  Speech evaluated for ability to swallow and deemed moderate aspiration risk with NPO status. KUB on 11/21 with possible ileus vs obstructive with worsening on 11/22 imaging. CCM wanted to hold off on TPN start 11/22 and push bowel regimen - BM documented but not significant, smear only per CCM. Pharmacy consulted to initiate TPN 11/23.  Glucose / Insulin: hx uncontrolled DM2. A1c 12.2% (PTA regimen: Lantus 58 units QHS + Novolog 18 units qAC + metformin but unclear compliance). CBGs 100s, 65u TPN in bag Electrolytes: K 4.0 (goal >/=4 with ileus), Mg 1.9 (goal >/=2 with ileus), CoCa ~9.9, Phos 3.6, Bicarb 32, others WNL Renal: SCr stable wnl. BUN WNL.  Hepatic: ALT/AST WNL. Tbili stable. Prealbumin <5, albumin <1.5, TG 75  Intake / Output; MIVF: Coretrak placed 12/2. Drain output up at 35 mL/24hrs, LBM 12/2 x2, UOP 1.3 mL/kg/hr.   GI Imaging: 11/23 [place] and MR of abdomen - interval worsening of small bowel dilation suggesting ileus or partial obstruction 11/28 CT ab/pelvis- liver lesions consistent with history of liver abscesses, ascites, nonobstructive thrombus (known) GI Surgeries / Procedures: none since consult for TPN   Central access: PICC planned 11/23 TPN start date: 11/23  Nutritional Goals: Goal TPN rate is 90 mL/hr (provides 125 g of protein and 2200 kcals per day)   RD Assessment: Estimated Needs Total Energy Estimated Needs: 2200-2400 Total Protein Estimated Needs: 125-145 grams Total Fluid Estimated Needs: >2L  Current Nutrition:  11/29 NPO (bipap dependent, transfer to the ICU>>Speech eval  pending)  Plan:  - Continue TPN at 90 mL/hr at 1800 - TPN will provide 125 g protein and 2200 kCal, meeting 100% of protein and kCal needs - Electrolytes in TPN: Na 50 mEq/L, K 50 mEq/L, Ca 5 mEq/L, Mg 10 mEq/L, and Phos 12 mmol/L. Cl:Ac 1:1 - Add standard MVI and trace elements to TPN.  - Add Pepcid 40mg  to TPN  - CBGs improved; Moderate q4h SSI and Dec to 60 units regular insulin in TPN bag . F/u CBGs and adjust as needed.  - Mag 2 gm IV x1 - Monitor TPN labs on Mon/Thurs - Coretrak placed 12/2, TF recs in, BiPAP-dependent, may need intubation.  Keep TPN 1 more day then transition to TF  Thank you for allowing pharmacy to be a part of this patient's care.  Donnald Garre, PharmD Clinical Pharmacist  Please check AMION for all [place] numbers After 10:00 PM, call [place] [phone]

## 2021-08-09 NOTE — Progress Notes (Signed)
ANTICOAGULATION CONSULT NOTE  Pharmacy Consult for Heparin Indication: Portal Vein Thrombosis   No Known Allergies  Patient Measurements: Height: 5\' 7"  (170.2 cm) Weight: 91.1 kg (200 lb 13.4 oz) IBW/kg (Calculated) : 66.1 Heparin Dosing Weight: 73 kg   Vital Signs: Temp: 98.2 F (36.8 C) (12/03 0800) Temp Source: Axillary (12/03 0800) BP: 159/66 (12/03 0900) Pulse Rate: 95 (12/03 0900)  Labs: Recent Labs    08/07/21 0429 08/07/21 0430 08/08/21 0454 08/08/21 0455 08/09/21 0317 08/09/21 0318  HGB 9.3*  --  8.9*  --   --  9.0*  HCT 29.1*  --  28.1*  --   --  28.0*  PLT 242  --  211  --   --  206  LABPROT  --  15.6*  --   --   --   --   INR  --  1.2  --   --   --   --   HEPARINUNFRC 0.46  --   --  0.41 0.32  --   CREATININE 0.61  --  0.48*  --   --  0.54*     Estimated Creatinine Clearance: 89.8 mL/min (A) (by C-G formula based on SCr of 0.54 mg/dL (L)).   Assessment: 70 yo male presented on 07/25/2021 after being found down with AMS found to have perforated liver abscess s/p partial omentectomy and drain placement. MRI with suspected portal vein thrombosis. Results of MRI dicussed with CCM and surgery. Pharmacy consulted to dose heparin.   Heparin level therapeutic at 0.32 on infusion at 2450 units/hr. No bleeding or IV issues noted.   Goal of Therapy:  Heparin level 0.3-0.7 units/ml Monitor platelets by anticoagulation protocol: Yes   Plan:  Continue heparin infusion at 2450 units/hr  Daily heparin level and CBC ordered Follow up long term anticoagulation plan  Thank you for allowing pharmacy to be a part of this patient's care.  Donnald Garre, PharmD Clinical Pharmacist  Please check AMION for all [place] numbers After 10:00 PM, call [place] [phone]

## 2021-08-09 NOTE — Progress Notes (Signed)
15 Days Post-Op  Subjective: On HFNC. Afebrile, WBC normal. Having bowel movements.  Objective: Vital signs in last 24 hours: Temp:  [98.1 F (36.7 C)-99.3 F (37.4 C)] 99.3 F (37.4 C) (12/03 1500) Pulse Rate:  [76-109] 98 (12/03 1500) Resp:  [15-25] 22 (12/03 1500) BP: (83-162)/(47-77) 138/71 (12/03 1500) SpO2:  [88 %-100 %] 88 % (12/03 1500) FiO2 (%):  [40 %-50 %] 50 % (12/03 1531) Weight:  [91.1 kg] 91.1 kg (12/03 0400) Last BM Date: 08/09/21  Intake/Output from previous day: 12/02 0701 - 12/03 0700 In: 2199.7 [I.V.:1833.4; IV Piggyback:361.3] Out: 2985 [Urine:2950; Drains:35] Intake/Output this shift: Total I/O In: 291.1 [I.V.:170.2; IV Piggyback:120.9] Out: 5397 [Urine:1450; Drains:25]  PE: Gen:  Awake and alert, on HFNC Abd: Soft, nontender. RUQ JP and perc drains with seropurulent fluid. LUQ drain with serous fluid in bulb without gross bile. Midline incision is open at the skin, wound bed is clean and dry with in tact fascia. Psych: A&Ox2  Lab Results:  Recent Labs    08/08/21 0454 08/09/21 0318  WBC 8.4 8.6  HGB 8.9* 9.0*  HCT 28.1* 28.0*  PLT 211 206   BMET Recent Labs    08/08/21 0454 08/09/21 0318  NA 135 135  K 3.8 4.0  CL 101 99  CO2 29 32  GLUCOSE 142* 92  BUN 27* 27*  CREATININE 0.48* 0.54*  CALCIUM 7.3* 7.8*   PT/INR Recent Labs    08/07/21 0430  LABPROT 15.6*  INR 1.2   CMP     Component Value Date/Time   NA 135 08/09/2021 0318   NA 142 01/15/2016 1031   K 4.0 08/09/2021 0318   CL 99 08/09/2021 0318   CO2 32 08/09/2021 0318   GLUCOSE 92 08/09/2021 0318   BUN 27 (H) 08/09/2021 0318   BUN 10 01/15/2016 1031   CREATININE 0.54 (L) 08/09/2021 0318   CREATININE 0.74 04/18/2021 1353   CALCIUM 7.8 (L) 08/09/2021 0318   PROT 5.1 (L) 08/07/2021 0429   PROT 6.2 01/15/2016 1031   ALBUMIN <1.5 (L) 08/07/2021 0429   ALBUMIN 4.1 01/15/2016 1031   AST 25 08/07/2021 0429   ALT 16 08/07/2021 0429   ALKPHOS 127 (H) 08/07/2021  0429   BILITOT 0.2 (L) 08/07/2021 0429   BILITOT 0.3 01/15/2016 1031   GFRNONAA >60 08/09/2021 0318   GFRNONAA 91 02/04/2021 0959   GFRAA 106 02/04/2021 0959   Lipase     Component Value Date/Time   LIPASE 36 07/27/2021 0420    Studies/Results: DG Chest Port 1 View  Result Date: 08/08/2021 CLINICAL DATA:  Respiratory failure. EXAM: PORTABLE CHEST 1 VIEW COMPARISON:  August 07, 2021. FINDINGS: Stable cardiomediastinal silhouette. Right lung is clear. Slightly improved left basilar atelectasis or pneumonia is noted. Right-sided PICC line is again noted. No pneumothorax is noted. Bony thorax is unremarkable. IMPRESSION: Slightly improved left basilar atelectasis or infiltrate. Electronically Signed   By: Marijo Conception M.D.   On: 08/08/2021 09:18   DG CHEST PORT 1 VIEW  Result Date: 08/07/2021 CLINICAL DATA:  Respiratory failure EXAM: PORTABLE CHEST 1 VIEW COMPARISON:  08/06/2021 FINDINGS: Progression of left lower lobe airspace consolidation as well as left effusion. Right lung remains clear. Negative for heart failure or edema Right arm PICC tip in the SVC unchanged. IMPRESSION: Progressive left lower lobe airspace disease and left effusion. Possible pneumonia. Electronically Signed   By: Franchot Gallo M.D.   On: 08/07/2021 15:43   DG Abd Portable 1V  Result Date: 08/08/2021 CLINICAL DATA:  Feeding tube placement. EXAM: PORTABLE ABDOMEN - 1 VIEW COMPARISON:  Abdominal radiograph 07/29/2021 FINDINGS: Enteric tube tip crosses the midline, projecting at the expected location of the second portion of the duodenum. Nonobstructive bowel gas pattern. Percutaneous left hepatic drain is in stable position. Left lower lobe atelectasis in the visualized lung bases. IMPRESSION: Enteric tube tip is post-pyloric, projecting at the expected location of second portion of the duodenum. Electronically Signed   By: Ileana Roup M.D.   On: 08/08/2021 12:33    Anti-infectives: Anti-infectives (From  admission, onward)    Start     Dose/Rate Route Frequency Ordered Stop   08/09/21 1600  Ampicillin-Sulbactam (UNASYN) 3 g in sodium chloride 0.9 % 100 mL IVPB        3 g 200 mL/hr over 30 Minutes Intravenous Every 8 hours 08/09/21 1441     07/29/21 1800  metroNIDAZOLE (FLAGYL) IVPB 500 mg  Status:  Discontinued        500 mg 100 mL/hr over 60 Minutes Intravenous Every 12 hours 07/29/21 0930 08/09/21 1422   07/29/21 1400  cefTRIAXone (ROCEPHIN) 2 g in sodium chloride 0.9 % 100 mL IVPB  Status:  Discontinued        2 g 200 mL/hr over 30 Minutes Intravenous Every 24 hours 07/29/21 0945 08/09/21 1422   07/26/21 1400  ceFEPIme (MAXIPIME) 2 g in sodium chloride 0.9 % 100 mL IVPB  Status:  Discontinued        2 g 200 mL/hr over 30 Minutes Intravenous Every 8 hours 07/26/21 0721 07/29/21 0945   07/26/21 0800  ceFEPIme (MAXIPIME) 2 g in sodium chloride 0.9 % 100 mL IVPB  Status:  Discontinued        2 g 200 mL/hr over 30 Minutes Intravenous Every 12 hours 07/25/21 1853 07/26/21 0213   07/26/21 0600  metroNIDAZOLE (FLAGYL) IVPB 500 mg  Status:  Discontinued        500 mg 100 mL/hr over 60 Minutes Intravenous Every 8 hours 07/26/21 0119 07/29/21 0930   07/26/21 0600  ceFEPIme (MAXIPIME) 2 g in sodium chloride 0.9 % 100 mL IVPB  Status:  Discontinued        2 g 200 mL/hr over 30 Minutes Intravenous Every 12 hours 07/26/21 0225 07/26/21 0721   07/25/21 1900  ceFEPIme (MAXIPIME) 2 g in sodium chloride 0.9 % 100 mL IVPB        2 g 200 mL/hr over 30 Minutes Intravenous  Once 07/25/21 1845 07/25/21 2015   07/25/21 1900  metroNIDAZOLE (FLAGYL) IVPB 500 mg        500 mg 100 mL/hr over 60 Minutes Intravenous  Once 07/25/21 1845 07/25/21 2138        Assessment/Plan POD 15 s/p Exploratory laparotomy with drainage of pyogenic liver abscess x 2, Partial omentectomy for Ruptured pyogenic liver abscesses by Dr. Dema Severin - 07/25/2021 - Cont IV abx per ID - Drains in place, appear purulent today - Continue  TPN, not safe for PO per speech. Begin tube feeds when off BiPAP. Having bowel function. - Continue PICC/TPN till tolerating PO better. Albumin < 1.5, pre-albumin <5 - Cont WTD BID to midline - Therapies, mobilize - Pulm toilet     HTN  Dementia/delirium/agitation - on Seroquel Acute respiratory failure - on BiPAP overnight. CCM following.  COPD - On scheduled duonebs.  GERD DM2 Prior hx IVDU (1970's)   LOS: 14 days    Dwan Bolt , MD [facility] 08/09/2021, 3:36 PM Please see Amion for pager number during day hours 7:00am-4:30pm

## 2021-08-09 NOTE — Progress Notes (Signed)
NAME:  Matthew Dominguez, MRN:  [id number], DOB:  23-Feb-1951, LOS: 18 ADMISSION DATE:  07/25/2021, CONSULTATION DATE:  11/18 REFERRING MD:  Roderic Palau, CHIEF COMPLAINT:  Sepsis concern   History of Present Illness:  Matthew Dominguez is a 70 y.o. M who was initially admitted on 07/25/2021 and underwent exploratory laparotomy with drainage of pyogenic liver abscess x2 as well as partial omentectomy for ruptured pyogenic liver abscesses. MRCP 11/23 showed numerous hepatic abscesses with L portal thrombosis extending into portal venous bifurcation, possible ascites, cholelithiasis, generalized bowel edema. RUQ [place] 11/28 showed multiple complex liver lesions measuring <3 cm consistent with multiple liver abscesses, ascites. He has been followed by surgery, infectious disease, IR to this point. On 11/29 he began having worsening respiratory distress at which point PCCM was consulted for further management and concern for intubation need.  Pertinent  Medical History  Asthma, COPD, T2DM, HTN, BPH, hypothyroidism  Significant Hospital Events: Including procedures, antibiotic start and stop dates in addition to other pertinent events   11/18 went from ER to OR for exploratory laparotomy and drainage of pyogenic liver abscesses (2) and partial omentectomy 11/19 ID consulted, Extubated 11/20 Passed bedside swallow. ID consulted. 11/21 Confused, required mitts. Cleviprex gtt continues for hypertension. 11/22 Ongoing Cleviprex needs. O2 needs decreased. Still slow ROBF, consideration of TPN if unable to tolerate feeds. 11/22 cefepime discontinued, changed to ceftriaxone, metronidazole continued. 11/23 MRCP (could not complete study)> numerous hepatic abscesses with left portal vein thrombosis, fluid in abdomen may represent ascites, cholelithiasis, possible stone in cystic duct, bilatearl effusions, mild intrahepatic biliary ductal distension, generalized bowel edema 11/28 increasing O2 requirement and transitioned to  Bipap, PCCM re-consulted 08/05/2021 increasing FiO2 needs desaturations poor air movement transfer to ICU questionable intubation. 11/30 CT abd/pelvis>>> Bilateral pleural effusions layering dependently with lower lobe atelectasis left worse than right. Surgical drain in place extending between the dome of the liver and the diaphragm. Diminishing areas of low-density and air within the left lobe of the liver consistent with improving liver abscesses. Continued demonstration of thrombosis of the left portal vein and partial thrombosis of the right portal vein. Increase in amount of generalized ascites.  Chololithiasis as seen previously. 12/1 CT drain left hepatic lobe abscess  Interim History / Subjective:  Restarted on BiPAP since 4 pm yesterday afternoon. Awake and alert  Objective   Blood pressure (!) 121/51, pulse 99, temperature 98.6 F (37 C), temperature source Axillary, resp. rate 20, height 5\' 7"  (1.702 m), weight 91.1 kg, SpO2 96 %.    FiO2 (%):  [40 %-70 %] 40 %   Intake/Output Summary (Last 24 hours) at 08/09/2021 0745 Last data filed at 08/09/2021 0612 Gross per 24 hour  Intake 2101.12 ml  Output 2985 ml  Net -883.88 ml   Filed Weights   08/04/21 0818 08/05/21 0751 08/09/21 0400  Weight: 97.5 kg 91.6 kg 91.1 kg   Physical Exam: General: Critically ill-appearing, no acute distress HENT: [place], AT, BiPAP in place Eyes: EOMI, no scleral icterus Respiratory: Diminished breath sounds bilaterally.  No crackles, wheezing or rales Cardiovascular: RRR, -M/R/G, no JVD GI: BS+, soft, RUQ drain x 2, midline dressing, L drain Extremities: Trace edema in lower extremities,-tenderness Neuro: Awake and alert, CNII-XII grossly intact GU: Foley in place  Labs reviewed 12/2 personally by me: BMET acceptable Stable Hg 9  CXR 08/08/21 - Slightly improved left lobe aeration Abd 12/2/2 [facility] Problem list     Assessment & Plan:   Acute hypoxemic  and  hypercarbic respiratory failure CFB +11L PLAN -  - Wean HHFNC for goal SpO2 >90% - Continue BiPAP as needed and qhs for now  - Continue lasix 60 mg BID - pulmonary hygiene: mucinex - mobilize as much as able  - intermittent f/u CXR  - wean supplemental O2 as able to keep sats >90%  COPD - Continue Pulmicort - Continue Duoneb TID  Liver abscess Portal vein thrombosis POD15 s/p exp lap with drainage liver abscess, partial omentectomy CT 11/30 with interval improvement  PLAN -  ID following  Continue rocephin/flagyl - ID recommending 4 weeks total abx  IR following - continuing surgical drain for now  F/u wound culture 12/1. Pending NPO - SLP eval once ok from resp standpoint  Continue TPN  Heparin gtt   Type 2 diabetes mellitus - Novolog SSI - Semglee 10 units BID  Hypertension - Continue metoprolol, amlodipine - Labetolol 5mg  IV q6h, hydralazine 10 mg IV q6h PRN SBP>180 or DBP>100  Acute urinary retention - Doxazosin 1mg  daily  Agitation requiring sedation - Seroquel nightly  Best Practice (right click and "Reselect all SmartList Selections" daily)   Diet/type: TPN DVT prophylaxis: systemic heparin GI prophylaxis: N/A Lines: Central line Foley:  N/A Code Status:  full code Last date of multidisciplinary goals of care discussion [11/21]  Critical care time:    The patient is critically ill with multiple organ systems failure and requires high complexity decision making for assessment and support, frequent evaluation and titration of therapies, application of advanced monitoring technologies and extensive interpretation of multiple databases.  Independent Critical Care Time: 38 Minutes.   [name], M.D. [facility] Pulmonary/Critical Care Medicine 08/09/2021 7:45 AM   Please see Amion for pager number to reach on-call Pulmonary and Critical Care Team.

## 2021-08-10 DIAGNOSIS — R6520 Severe sepsis without septic shock: Secondary | ICD-10-CM | POA: Diagnosis not present

## 2021-08-10 DIAGNOSIS — A419 Sepsis, unspecified organism: Secondary | ICD-10-CM | POA: Diagnosis not present

## 2021-08-10 LAB — BASIC METABOLIC PANEL
Anion gap: 6 (ref 5–15)
BUN: 20 mg/dL (ref 8–23)
CO2: 30 mmol/L (ref 22–32)
Calcium: 7.8 mg/dL — ABNORMAL LOW (ref 8.9–10.3)
Chloride: 98 mmol/L (ref 98–111)
Creatinine, Ser: 0.47 mg/dL — ABNORMAL LOW (ref 0.61–1.24)
GFR, Estimated: >60 mL/min (ref >60)
Glucose, Bld: 104 mg/dL — ABNORMAL HIGH (ref 70–99)
Potassium: 3.9 mmol/L (ref 3.5–5.1)
Sodium: 134 mmol/L — ABNORMAL LOW (ref 135–145)

## 2021-08-10 LAB — GLUCOSE, CAPILLARY
Glucose-Capillary: 102 mg/dL — ABNORMAL HIGH (ref 70–99)
Glucose-Capillary: 132 mg/dL — ABNORMAL HIGH (ref 70–99)
Glucose-Capillary: 162 mg/dL — ABNORMAL HIGH (ref 70–99)
Glucose-Capillary: 150 mg/dL — ABNORMAL HIGH (ref 70–99)
Glucose-Capillary: 91 mg/dL (ref 70–99)
Glucose-Capillary: 105 mg/dL — ABNORMAL HIGH (ref 70–99)

## 2021-08-10 LAB — MAGNESIUM: Magnesium: 1.8 mg/dL (ref 1.7–2.4)

## 2021-08-10 LAB — HEPARIN LEVEL (UNFRACTIONATED): Heparin Unfractionated: 0.55 IU/mL (ref 0.30–0.70)

## 2021-08-10 LAB — PHOSPHORUS: Phosphorus: 4.3 mg/dL (ref 2.5–4.6)

## 2021-08-10 MED ORDER — TRAVASOL 10 % IV SOLN
INTRAVENOUS | Status: AC
  Filled 2021-08-10: qty 1252.8

## 2021-08-10 MED ORDER — IPRATROPIUM-ALBUTEROL 0.5-2.5 (3) MG/3ML IN SOLN
3.0000 mL | Freq: Two times a day (BID) | RESPIRATORY_TRACT | Status: DC
  Administered 2021-08-10 – 2021-08-14 (×8): 3 mL via RESPIRATORY_TRACT
  Filled 2021-08-10 (×8): qty 3

## 2021-08-10 MED ORDER — FUROSEMIDE 10 MG/ML IJ SOLN
80.0000 mg | Freq: Two times a day (BID) | INTRAMUSCULAR | Status: DC
Start: 2021-08-10 — End: 2021-08-14
  Administered 2021-08-10 – 2021-08-14 (×8): 80 mg via INTRAVENOUS
  Filled 2021-08-10 (×8): qty 8

## 2021-08-10 MED ORDER — QUETIAPINE FUMARATE 50 MG PO TABS
50.0000 mg | ORAL_TABLET | Freq: Every day | ORAL | Status: DC
  Administered 2021-08-10: 21:00:00 50 mg via ORAL
  Filled 2021-08-10: qty 1

## 2021-08-10 NOTE — Progress Notes (Signed)
PHARMACY - TOTAL PARENTERAL NUTRITION CONSULT NOTE   Indication: Prolonged ileus  Patient Measurements: Height: 5\' 7"  (170.2 cm) Weight: 90.4 kg (199 lb 4.7 oz) IBW/kg (Calculated) : 66.1 TPN AdjBW (KG): 73.7 Body mass index is 31.21 kg/m.  Assessment: 39 yom with perforated liver abscess s/p partial omentectomy and drains in the right and left upper quadrants.  Speech evaluated for ability to swallow and deemed moderate aspiration risk with NPO status. KUB on 11/21 with possible ileus vs obstructive with worsening on 11/22 imaging. CCM wanted to hold off on TPN start 11/22 and push bowel regimen - BM documented but not significant, smear only per CCM. Pharmacy consulted to initiate TPN 11/23.  Glucose / Insulin: hx uncontrolled DM2. A1c 12.2% (PTA regimen: Lantus 58 units QHS + Novolog 18 units qAC + metformin but unclear compliance). CBGs 100-150s, 60u TPN in bag Electrolytes: K 3.9 (goal >/=4 with ileus), Mg 1.8 (goal >/=2 with ileus), CoCa ~9.9, Phos 4.3, Bicarb 30, others WNL Renal: SCr stable wnl. BUN WNL.  Hepatic: ALT/AST WNL. Tbili stable. Prealbumin <5, albumin <1.5, TG 75  Intake / Output; MIVF: Coretrak placed 12/2 (issues with placement) Drain output up at 35 mL/24hrs, LBM 12/2 x2, UOP 1.3 mL/kg/hr.   GI Imaging: 11/23 [place] and MR of abdomen - interval worsening of small bowel dilation suggesting ileus or partial obstruction 11/28 CT ab/pelvis- liver lesions consistent with history of liver abscesses, ascites, nonobstructive thrombus (known) GI Surgeries / Procedures: none since consult for TPN   Central access: PICC planned 11/23 TPN start date: 11/23  Nutritional Goals: Goal TPN rate is 90 mL/hr (provides 125 g of protein and 2200 kcals per day)   RD Assessment: Estimated Needs Total Energy Estimated Needs: 2200-2400 Total Protein Estimated Needs: 125-145 grams Total Fluid Estimated Needs: >2L  Current Nutrition:  12/2 Cortrak placed, TF initiated, but started  having issues with the tube.  Will need to be replaced  Plan:  - Continue TPN at 90 mL/hr at 1800 - TPN will provide 125 g protein and 2200 kCal, meeting 100% of protein and kCal needs - Electrolytes in TPN: Na 50 mEq/L, K 50 mEq/L, Ca 5 mEq/L, Mg 10 mEq/L, and Dec Phos 10 mmol/L. Cl:Ac 1:1 - Add standard MVI and trace elements to TPN.  - Add Pepcid 40mg  to TPN  - CBGs improved; Moderate q4h SSI and 60 units regular insulin in TPN bag . F/u CBGs and adjust as needed.  - Mag 2 gm IV x1 - Monitor TPN labs on Mon/Thurs -12/2 Cortrak placed, TF initiated, but started having issues with the tube.  Will need to be replaced  Thank you for allowing pharmacy to be a part of this patient's care.  Donnald Garre, PharmD Clinical Pharmacist  Please check AMION for all [place] numbers After 10:00 PM, call [place] [phone]

## 2021-08-10 NOTE — Progress Notes (Signed)
NAME:  Matthew Dominguez, MRN:  [id number], DOB:  Jul 25, 1951, LOS: 97 ADMISSION DATE:  07/25/2021, CONSULTATION DATE:  11/18 REFERRING MD:  Roderic Palau, CHIEF COMPLAINT:  Sepsis concern   History of Present Illness:  Matthew Dominguez is a 70 y.o. M who was initially admitted on 07/25/2021 and underwent exploratory laparotomy with drainage of pyogenic liver abscess x2 as well as partial omentectomy for ruptured pyogenic liver abscesses. MRCP 11/23 showed numerous hepatic abscesses with L portal thrombosis extending into portal venous bifurcation, possible ascites, cholelithiasis, generalized bowel edema. RUQ [place] 11/28 showed multiple complex liver lesions measuring <3 cm consistent with multiple liver abscesses, ascites. He has been followed by surgery, infectious disease, IR to this point. On 11/29 he began having worsening respiratory distress at which point PCCM was consulted for further management and concern for intubation need.  Pertinent  Medical History  Asthma, COPD, T2DM, HTN, BPH, hypothyroidism  Significant Hospital Events: Including procedures, antibiotic start and stop dates in addition to other pertinent events   11/18 went from ER to OR for exploratory laparotomy and drainage of pyogenic liver abscesses (2) and partial omentectomy 11/19 ID consulted, Extubated 11/20 Passed bedside swallow. ID consulted. 11/21 Confused, required mitts. Cleviprex gtt continues for hypertension. 11/22 Ongoing Cleviprex needs. O2 needs decreased. Still slow ROBF, consideration of TPN if unable to tolerate feeds. 11/22 cefepime discontinued, changed to ceftriaxone, metronidazole continued. 11/23 MRCP (could not complete study)> numerous hepatic abscesses with left portal vein thrombosis, fluid in abdomen may represent ascites, cholelithiasis, possible stone in cystic duct, bilatearl effusions, mild intrahepatic biliary ductal distension, generalized bowel edema 11/28 increasing O2 requirement and transitioned to  Bipap, PCCM re-consulted 08/05/2021 increasing FiO2 needs desaturations poor air movement transfer to ICU questionable intubation. 11/30 CT abd/pelvis>>> Bilateral pleural effusions layering dependently with lower lobe atelectasis left worse than right. Surgical drain in place extending between the dome of the liver and the diaphragm. Diminishing areas of low-density and air within the left lobe of the liver consistent with improving liver abscesses. Continued demonstration of thrombosis of the left portal vein and partial thrombosis of the right portal vein. Increase in amount of generalized ascites.  Chololithiasis as seen previously. 12/1 CT drain left hepatic lobe abscess 12/3 Improved mental status. Tolerated HHFN during day and BiPAP at night  Interim History / Subjective:  Yesterday improved mental status. Tolerated HHFN during day and BiPAP at night. Interactive with family  Overnight, agitated and difficult sleeping  Objective   Blood pressure (!) 147/61, pulse 75, temperature 98.3 F (36.8 C), temperature source Axillary, resp. rate (!) 24, height 5\' 7"  (1.702 m), weight 90.4 kg, SpO2 100 %.    FiO2 (%):  [50 %] 50 %   Intake/Output Summary (Last 24 hours) at 08/10/2021 0856 Last data filed at 08/10/2021 0600 Gross per 24 hour  Intake 1809.56 ml  Output 3695 ml  Net -1885.44 ml   Filed Weights   08/05/21 0751 08/09/21 0400 08/09/21 2000  Weight: 91.6 kg 91.1 kg 90.4 kg   Physical Exam: General: Chronically ill-appearing, no acute distress HENT: [place], AT, BiPAP in place Eyes: EOMI, no scleral icterus Respiratory: Diminished but clear to auscultation bilaterally.  No crackles, wheezing or rales Cardiovascular: RRR, -M/R/G, no JVD GI: BS+, soft, RUQ drain x 2, midline draining, L drain Extremities: 1+ pitting edema in lower extremities,-tenderness Neuro: Awake, following commands, moves extremities x 4 GU: Foley in place    BMET 12/4 acceptable Stable 12/3 Hg 9  12/2  Wound  culture - Few enterococcus, final pending  CXR 08/08/21 - Slightly improved left lobe aeration Abd 12/2/2 - Post-pyloric  Imaging, labs and test noted above have been reviewed independently by me.  Resolved Hospital Problem list     Assessment & Plan:   Agitation requiring sedation - Increase Seroquel 50 nightly  Acute hypoxemic and hypercarbic respiratory failure - Wean HHFNC for goal SpO2 >90% - Continue BiPAP as needed and qhs for now  - Increase lasix 80 mg BID. CFB +7.5L - pulmonary hygiene: mucinex - mobilize as much as able  - intermittent f/u CXR  - wean supplemental O2 as able to keep sats >90%  COPD - Continue Pulmicort - Continue Duoneb TID  Liver abscess, +Enterococcus Portal vein thrombosis POD16 s/p exp lap with drainage liver abscess, partial omentectomy CT 11/30 with interval improvement  PLAN -  - ID following. Changed antibiotics to Unasyn - IR following - continuing surgical drain for now  - F/u final wound culture 12/1 - NPO - SLP eval once ok from resp standpoint  - Start TF during day if awake and alert. Consider tapering TPN  - Heparin gtt   Type 2 diabetes mellitus - Novolog SSI - Semglee 10 units BID  Hypertension - Continue metoprolol, amlodipine - Labetolol 5mg  IV q6h, hydralazine 10 mg IV q6h PRN SBP>180 or DBP>100  Acute urinary retention - Doxazosin 1mg  daily   Best Practice (right click and "Reselect all SmartList Selections" daily)   Diet/type: TPN, TF when off BiPAP DVT prophylaxis: systemic heparin GI prophylaxis: N/A Lines: Central line Foley:  N/A Code Status:  full code Last date of multidisciplinary goals of care discussion [11/21] Dispo: Remain in ICU for tenuous mental and respiratory status  Updated granddaughter at bedside on 12/3  Critical care time:    The patient is critically ill with multiple organ systems failure and requires high complexity decision making for assessment and support, frequent  evaluation and titration of therapies, application of advanced monitoring technologies and extensive interpretation of multiple databases.  Independent Critical Care Time: 23 Minutes.   [name], M.D. [facility] Pulmonary/Critical Care Medicine 08/10/2021 8:57 AM   Please see Amion for pager number to reach on-call Pulmonary and Critical Care Team.

## 2021-08-10 NOTE — Progress Notes (Signed)
ANTICOAGULATION CONSULT NOTE  Pharmacy Consult for Heparin Indication: Portal Vein Thrombosis   No Known Allergies  Patient Measurements: Height: 5\' 7"  (170.2 cm) Weight: 90.4 kg (199 lb 4.7 oz) IBW/kg (Calculated) : 66.1 Heparin Dosing Weight: 73 kg   Vital Signs: Temp: 97.9 F (36.6 C) (12/04 0335) Temp Source: Axillary (12/04 0335) BP: 127/63 (12/04 0723) Pulse Rate: 73 (12/04 0723)  Labs: Recent Labs    08/08/21 0454 08/08/21 0455 08/09/21 0317 08/09/21 0318 08/09/21 2000 08/10/21 0401  HGB 8.9*  --   --  9.0*  --   --   HCT 28.1*  --   --  28.0*  --   --   PLT 211  --   --  206  --   --   HEPARINUNFRC  --  0.41 0.32  --   --  0.55  CREATININE 0.48*  --   --  0.54* 0.56* 0.47*     Estimated Creatinine Clearance: 89.5 mL/min (A) (by C-G formula based on SCr of 0.47 mg/dL (L)).   Assessment: 70 yo male presented on 07/25/2021 after being found down with AMS found to have perforated liver abscess s/p partial omentectomy and drain placement. MRI with suspected portal vein thrombosis. Results of MRI dicussed with CCM and surgery. Pharmacy consulted to dose heparin.   Heparin level therapeutic at 0.55 on infusion at 2450 units/hr. No bleeding or IV issues noted.   Goal of Therapy:  Heparin level 0.3-0.7 units/ml Monitor platelets by anticoagulation protocol: Yes   Plan:  Continue heparin infusion at 2450 units/hr  Daily heparin level and CBC ordered Follow up long term anticoagulation plan  Thank you for allowing pharmacy to be a part of this patient's care.  Donnald Garre, PharmD Clinical Pharmacist  Please check AMION for all [place] numbers After 10:00 PM, call [place] [phone]

## 2021-08-10 NOTE — Progress Notes (Signed)
RT NOTES: Pt placed back on bipap d/t some confusion.

## 2021-08-10 NOTE — Progress Notes (Signed)
16 Days Post-Op  Subjective: On BiPAP this morning. Afebrile. Drain output minimal.  Objective: Vital signs in last 24 hours: Temp:  [96.6 F (35.9 C)-99.3 F (37.4 C)] 98.3 F (36.8 C) (12/04 0707) Pulse Rate:  [73-116] 81 (12/04 0900) Resp:  [17-28] 22 (12/04 0900) BP: (116-170)/(44-83) 140/60 (12/04 0900) SpO2:  [88 %-100 %] 93 % (12/04 1010) FiO2 (%):  [40 %-50 %] 40 % (12/04 1010) Weight:  [90.4 kg] 90.4 kg (12/03 2000) Last BM Date: 08/10/21  Intake/Output from previous day: 12/03 0701 - 12/04 0700 In: 2083.9 [I.V.:1703.1; NG/GT:60; IV Piggyback:320.8] Out: 3695 [Urine:3650; Drains:45] Intake/Output this shift: Total I/O In: 329 [I.V.:228.9; IV Piggyback:100.1] Out: 1000 [Urine:1000]  PE: Gen:  Awake and alert, on HFNC Abd: Soft, nontender. RUQ JP and perc drains with seropurulent fluid. LUQ drain with serous fluid in bulb without gross bile. Midline incision with clean dry dressing in place. Psych: A&Ox2  Lab Results:  Recent Labs    08/08/21 0454 08/09/21 0318  WBC 8.4 8.6  HGB 8.9* 9.0*  HCT 28.1* 28.0*  PLT 211 206   BMET Recent Labs    08/09/21 2000 08/10/21 0401  NA 134* 134*  K 3.8 3.9  CL 97* 98  CO2 30 30  GLUCOSE 144* 104*  BUN 22 20  CREATININE 0.56* 0.47*  CALCIUM 8.0* 7.8*   PT/INR No results for input(s): LABPROT, INR in the last 72 hours.  CMP     Component Value Date/Time   NA 134 (L) 08/10/2021 0401   NA 142 01/15/2016 1031   K 3.9 08/10/2021 0401   CL 98 08/10/2021 0401   CO2 30 08/10/2021 0401   GLUCOSE 104 (H) 08/10/2021 0401   BUN 20 08/10/2021 0401   BUN 10 01/15/2016 1031   CREATININE 0.47 (L) 08/10/2021 0401   CREATININE 0.74 04/18/2021 1353   CALCIUM 7.8 (L) 08/10/2021 0401   PROT 5.1 (L) 08/07/2021 0429   PROT 6.2 01/15/2016 1031   ALBUMIN <1.5 (L) 08/07/2021 0429   ALBUMIN 4.1 01/15/2016 1031   AST 25 08/07/2021 0429   ALT 16 08/07/2021 0429   ALKPHOS 127 (H) 08/07/2021 0429   BILITOT 0.2 (L)  08/07/2021 0429   BILITOT 0.3 01/15/2016 1031   GFRNONAA >60 08/10/2021 0401   GFRNONAA 91 02/04/2021 0959   GFRAA 106 02/04/2021 0959   Lipase     Component Value Date/Time   LIPASE 36 07/27/2021 0420    Studies/Results: DG Abd Portable 1V  Result Date: 08/08/2021 CLINICAL DATA:  Feeding tube placement. EXAM: PORTABLE ABDOMEN - 1 VIEW COMPARISON:  Abdominal radiograph 07/29/2021 FINDINGS: Enteric tube tip crosses the midline, projecting at the expected location of the second portion of the duodenum. Nonobstructive bowel gas pattern. Percutaneous left hepatic drain is in stable position. Left lower lobe atelectasis in the visualized lung bases. IMPRESSION: Enteric tube tip is post-pyloric, projecting at the expected location of second portion of the duodenum. Electronically Signed   By: Ileana Roup M.D.   On: 08/08/2021 12:33    Anti-infectives: Anti-infectives (From admission, onward)    Start     Dose/Rate Route Frequency Ordered Stop   08/09/21 1600  Ampicillin-Sulbactam (UNASYN) 3 g in sodium chloride 0.9 % 100 mL IVPB        3 g 200 mL/hr over 30 Minutes Intravenous Every 8 hours 08/09/21 1441     07/29/21 1800  metroNIDAZOLE (FLAGYL) IVPB 500 mg  Status:  Discontinued  500 mg 100 mL/hr over 60 Minutes Intravenous Every 12 hours 07/29/21 0930 08/09/21 1422   07/29/21 1400  cefTRIAXone (ROCEPHIN) 2 g in sodium chloride 0.9 % 100 mL IVPB  Status:  Discontinued        2 g 200 mL/hr over 30 Minutes Intravenous Every 24 hours 07/29/21 0945 08/09/21 1422   07/26/21 1400  ceFEPIme (MAXIPIME) 2 g in sodium chloride 0.9 % 100 mL IVPB  Status:  Discontinued        2 g 200 mL/hr over 30 Minutes Intravenous Every 8 hours 07/26/21 0721 07/29/21 0945   07/26/21 0800  ceFEPIme (MAXIPIME) 2 g in sodium chloride 0.9 % 100 mL IVPB  Status:  Discontinued        2 g 200 mL/hr over 30 Minutes Intravenous Every 12 hours 07/25/21 1853 07/26/21 0213   07/26/21 0600  metroNIDAZOLE (FLAGYL)  IVPB 500 mg  Status:  Discontinued        500 mg 100 mL/hr over 60 Minutes Intravenous Every 8 hours 07/26/21 0119 07/29/21 0930   07/26/21 0600  ceFEPIme (MAXIPIME) 2 g in sodium chloride 0.9 % 100 mL IVPB  Status:  Discontinued        2 g 200 mL/hr over 30 Minutes Intravenous Every 12 hours 07/26/21 0225 07/26/21 0721   07/25/21 1900  ceFEPIme (MAXIPIME) 2 g in sodium chloride 0.9 % 100 mL IVPB        2 g 200 mL/hr over 30 Minutes Intravenous  Once 07/25/21 1845 07/25/21 2015   07/25/21 1900  metroNIDAZOLE (FLAGYL) IVPB 500 mg        500 mg 100 mL/hr over 60 Minutes Intravenous  Once 07/25/21 1845 07/25/21 2138        Assessment/Plan POD 15 s/p Exploratory laparotomy with drainage of pyogenic liver abscess x 2, Partial omentectomy for Ruptured pyogenic liver abscesses by Dr. Dema Severin - 07/25/2021 - Cont IV abx per ID - Drains in place, seropurulent minimal output. - Continue TPN, not safe for PO per speech. Ok for enteral feeds when off BiPAP. Continue TPN until feeds at goal. - Cont WTD BID to midline - Therapies, mobilize - Pulm toilet     HTN  Dementia/delirium/agitation - on Seroquel Acute respiratory failure - on BiPAP overnight. CCM following.  COPD - On scheduled duonebs.  GERD DM2 Prior hx IVDU (1970's)   LOS: 35 days    Dwan Bolt , MD [facility] 08/10/2021, 10:25 AM Please see Amion for pager number during day hours 7:00am-4:30pm

## 2021-08-11 ENCOUNTER — Inpatient Hospital Stay (HOSPITAL_COMMUNITY): Payer: Medicare HMO

## 2021-08-11 DIAGNOSIS — R6520 Severe sepsis without septic shock: Secondary | ICD-10-CM | POA: Diagnosis not present

## 2021-08-11 DIAGNOSIS — A419 Sepsis, unspecified organism: Secondary | ICD-10-CM | POA: Diagnosis not present

## 2021-08-11 LAB — GLUCOSE, CAPILLARY
Glucose-Capillary: 120 mg/dL — ABNORMAL HIGH (ref 70–99)
Glucose-Capillary: 119 mg/dL — ABNORMAL HIGH (ref 70–99)
Glucose-Capillary: 155 mg/dL — ABNORMAL HIGH (ref 70–99)
Glucose-Capillary: 134 mg/dL — ABNORMAL HIGH (ref 70–99)
Glucose-Capillary: 163 mg/dL — ABNORMAL HIGH (ref 70–99)

## 2021-08-11 LAB — COMPREHENSIVE METABOLIC PANEL
ALT: 17 U/L (ref 0–44)
AST: 25 U/L (ref 15–41)
Albumin: <1.5 g/dL — ABNORMAL LOW (ref 3.5–5.0)
Alkaline Phosphatase: 111 U/L (ref 38–126)
Anion gap: 4 — ABNORMAL LOW (ref 5–15)
BUN: 18 mg/dL (ref 8–23)
CO2: 31 mmol/L (ref 22–32)
Calcium: 7.9 mg/dL — ABNORMAL LOW (ref 8.9–10.3)
Chloride: 99 mmol/L (ref 98–111)
Creatinine, Ser: 0.54 mg/dL — ABNORMAL LOW (ref 0.61–1.24)
GFR, Estimated: >60 mL/min (ref >60)
Glucose, Bld: 122 mg/dL — ABNORMAL HIGH (ref 70–99)
Potassium: 3.9 mmol/L (ref 3.5–5.1)
Sodium: 134 mmol/L — ABNORMAL LOW (ref 135–145)
Total Bilirubin: 0.4 mg/dL (ref 0.3–1.2)
Total Protein: 5.3 g/dL — ABNORMAL LOW (ref 6.5–8.1)

## 2021-08-11 LAB — PHOSPHORUS: Phosphorus: 4 mg/dL (ref 2.5–4.6)

## 2021-08-11 LAB — TRIGLYCERIDES: Triglycerides: 76 mg/dL (ref <150)

## 2021-08-11 LAB — MAGNESIUM: Magnesium: 1.7 mg/dL (ref 1.7–2.4)

## 2021-08-11 LAB — HEPARIN LEVEL (UNFRACTIONATED): Heparin Unfractionated: 0.51 IU/mL (ref 0.30–0.70)

## 2021-08-11 MED ORDER — CALCIUM FOR TPN
INJECTION | INTRAVENOUS | Status: DC
Start: 2021-08-11 — End: 2021-08-11

## 2021-08-11 MED ORDER — LORAZEPAM 2 MG/ML IJ SOLN: 1.0000 mg | INTRAMUSCULAR | Status: DC

## 2021-08-11 MED ORDER — PROSOURCE TF PO LIQD
45.0000 mL | Freq: Four times a day (QID) | ORAL | Status: DC
  Administered 2021-08-11 – 2021-08-14 (×12): 45 mL
  Filled 2021-08-11 (×16): qty 45

## 2021-08-11 MED ORDER — QUETIAPINE FUMARATE 50 MG PO TABS
50.0000 mg | ORAL_TABLET | Freq: Two times a day (BID) | ORAL | Status: DC
  Administered 2021-08-11 (×2): 50 mg via ORAL
  Filled 2021-08-11 (×2): qty 1

## 2021-08-11 MED ORDER — TRAVASOL 10 % IV SOLN
INTRAVENOUS | Status: AC
  Filled 2021-08-11: qty 626.4

## 2021-08-11 MED ORDER — OSMOLITE 1.5 CAL PO LIQD
1000.0000 mL | ORAL | Status: DC
  Administered 2021-08-11 – 2021-08-14 (×4): 1000 mL
  Filled 2021-08-11 (×5): qty 1000

## 2021-08-11 MED ORDER — LORAZEPAM 2 MG/ML IJ SOLN
1.0000 mg | INTRAMUSCULAR | Status: DC | PRN
  Administered 2021-08-11: 1 mg via INTRAVENOUS
  Administered 2021-08-13: 2 mg via INTRAVENOUS
  Administered 2021-08-13 (×2): 1 mg via INTRAVENOUS
  Filled 2021-08-11 (×3): qty 1

## 2021-08-11 NOTE — Progress Notes (Signed)
[place] for Infectious Disease  Date of Admission:  07/25/2021   Total days of inpatient antibiotics 4  Principal Problem:   Severe sepsis (HCC) Active Problems:   Diabetes mellitus type 2, controlled ([place])   Essential hypertension, benign   COPD (chronic obstructive pulmonary disease) (HCC)   Mild cognitive impairment   Liver abscess   Malnutrition of moderate degree   Portal vein thrombosis   Hypertensive urgency   Ileus, postoperative (HCC)   Acute metabolic encephalopathy   Acute hypercapnic respiratory failure (HCC)   Fever   Acute urinary retention          Assessment: 70 year old male with diabetes poorly controlled, COPD, mild cognitive impairment admitted for pneumoperitoneum secondary to ruptured liver abscess.  CT showed pneumoperitoneum, cholelithiasis, fatty liver.  On arrival LFTs elevated with a T bili of 3.5.  ID consult for antibiotic recommendations.   #Pneumoperitoneum 2/2 ruptured pyogenic liver abscess as a complication of  pyelophlebitis SP Ex lap on 11/18 # TPN #Uncontrolled DM A1c 12.5 on 12/18/20 #Chronic fatty liver disease, NASH: LFTs consistently elevated since 2017 #Remote Hx of IVDA(last use 1974) -MRCP showed portal vein thrombosis  and multiple liver abscesses, mild intrahepatic biliary ductal distension. GI consulted. Plan on colonoscopy in the future to evaluate for colon malignancy. Pt is on antibiotics+heparin gtt for  pyelophlebitis.  -Pt continued to have leukocytosis/fever despite being on IV antibiotics.  IR consulted and pt underwent CT guided aspiration of liver abscess on 12/1, 72ml purulent fluid aspirate and sent for Cx +Enterococcus faecalis. Antibiotics switched to Unasyn  -Leukocytosis and fever resolved.   Recommendations:  -Continue Unasyn, switch to Augmentin 875-125mg  PO bid when able to tolerate PO -Anticipate atleast 4 weeks of antibiotics(11/18-12/15) - HBV re-immunization on discharge - ID will follow  peripherally, and will schedule discharge appointment closer to discharge   Microbiology:   Antibiotics: Cefepime 11/18-21 metronidazole 11/18-p Ceftriaxone 11/22-p  Cultures: Blood Cx: 11/19 NGTD, 11/29 Urine Cx: 11/29 Aspirtae fluid Cx form 12/1 Enterococcus faecalis      SUBJECTIVE: Pt is on HFNC. He is resting in bed.  Much more coherent this AM. No new complaints.   Interval: Afebrile overnight, wbc 10.2K Review of Systems: Review of Systems  All other systems reviewed and are negative.   Scheduled Meds:  amLODipine  10 mg Oral Daily   budesonide (PULMICORT) nebulizer solution  0.5 mg Nebulization BID   Chlorhexidine Gluconate Cloth  6 each Topical Daily   collagenase   Topical Daily   docusate sodium  100 mg Oral BID   doxazosin  1 mg Oral Daily   feeding supplement (PROSource TF)  45 mL Per Tube QID   furosemide  80 mg Intravenous BID   guaiFENesin  15 mL Per Tube Q4H   insulin aspart  0-15 Units Subcutaneous Q4H   ipratropium-albuterol  3 mL Nebulization BID   mouth rinse  15 mL Mouth Rinse BID   metoprolol tartrate  10 mg Intravenous Q6H   QUEtiapine  50 mg Oral BID   sodium chloride flush  10-40 mL Intracatheter Q12H   sodium chloride flush  10-40 mL Intracatheter Q12H   sodium chloride flush  5 mL Intracatheter Q8H   Continuous Infusions:  sodium chloride Stopped (08/06/21 1126)   ampicillin-sulbactam (UNASYN) IV Stopped (08/11/21 0831)   feeding supplement (OSMOLITE 1.5 CAL) Stopped (08/11/21 1030)   heparin 2,450 Units/hr (08/11/21 1100)   TPN ADULT (ION) 90 mL/hr at 08/11/21  1100   TPN ADULT (ION)     PRN Meds:.sodium chloride, acetaminophen, albuterol, fentaNYL (SUBLIMAZE) injection, hydrALAZINE, influenza vaccine adjuvanted, labetalol, ondansetron (ZOFRAN) IV No Known Allergies  OBJECTIVE: Vitals:   08/11/21 1013 08/11/21 1100 08/11/21 1106 08/11/21 1107  BP:  (!) 124/51    Pulse: 96   99  Resp: (!) 21   (!) 25  Temp:   98.5 F (36.9 C)    TempSrc:   Oral   SpO2: 92%   94%  Weight:      Height:       Body mass index is 30.59 kg/m.  Physical Exam Constitutional:      General: He is not in acute distress.    Appearance: He is normal weight. He is not toxic-appearing.  HENT:     Head: Normocephalic and atraumatic.     Right Ear: External ear normal.     Left Ear: External ear normal.     Nose: No congestion or rhinorrhea.     Mouth/Throat:     Mouth: Mucous membranes are moist.     Pharynx: Oropharynx is clear.  Eyes:     Extraocular Movements: Extraocular movements intact.     Conjunctiva/sclera: Conjunctivae normal.     Pupils: Pupils are equal, round, and reactive to light.  Cardiovascular:     Rate and Rhythm: Normal rate and regular rhythm.     Heart sounds: No murmur heard.   No friction rub. No gallop.  Pulmonary:     Effort: Pulmonary effort is normal.     Breath sounds: Normal breath sounds.  Abdominal:     Palpations: Abdomen is soft.     Comments: Distended, tender  Musculoskeletal:        General: No swelling. Normal range of motion.     Cervical back: Normal range of motion and neck supple.  Skin:    General: Skin is warm and dry.  Neurological:     General: No focal deficit present.     Mental Status: He is oriented to person, place, and time.  Psychiatric:        Mood and Affect: Mood normal.      Lab Results Lab Results  Component Value Date   WBC 8.6 08/09/2021   HGB 9.0 (L) 08/09/2021   HCT 28.0 (L) 08/09/2021   MCV 96.9 08/09/2021   PLT 206 08/09/2021    Lab Results  Component Value Date   CREATININE 0.54 (L) 08/11/2021   BUN 18 08/11/2021   NA 134 (L) 08/11/2021   K 3.9 08/11/2021   CL 99 08/11/2021   CO2 31 08/11/2021    Lab Results  Component Value Date   ALT 17 08/11/2021   AST 25 08/11/2021   ALKPHOS 111 08/11/2021   BILITOT 0.4 08/11/2021        Laurice Record, [facility] for Infectious Disease [facility] 08/11/2021, 11:54 AM

## 2021-08-11 NOTE — Progress Notes (Signed)
ANTICOAGULATION CONSULT NOTE  Pharmacy Consult for Heparin Indication: Portal Vein Thrombosis   No Known Allergies  Patient Measurements: Height: 5\' 7"  (170.2 cm) Weight: 88.6 kg (195 lb 5.2 oz) IBW/kg (Calculated) : 66.1 Heparin Dosing Weight: 73 kg   Vital Signs: Temp: 99 F (37.2 C) (12/05 0305) Temp Source: Axillary (12/05 0305) BP: 137/56 (12/05 0700) Pulse Rate: 89 (12/05 0734)  Labs: Recent Labs    08/09/21 0317 08/09/21 0318 08/09/21 0318 08/09/21 2000 08/10/21 0401 08/11/21 0357 08/11/21 0358  HGB  --  9.0*  --   --   --   --   --   HCT  --  28.0*  --   --   --   --   --   PLT  --  206  --   --   --   --   --   HEPARINUNFRC 0.32  --   --   --  0.55 0.51  --   CREATININE  --  0.54*   < > 0.56* 0.47*  --  0.54*   < > = values in this interval not displayed.     Estimated Creatinine Clearance: 88.7 mL/min (A) (by C-G formula based on SCr of 0.54 mg/dL (L)).   Assessment: 70 yo male presented on 07/25/2021 after being found down with AMS found to have perforated liver abscess s/p partial omentectomy and drain placement. MRI with suspected portal vein thrombosis. Results of MRI dicussed with CCM and surgery. Pharmacy consulted to dose heparin.   Heparin level therapeutic at 0.51 on infusion at 2450 units/hr. No bleeding or IV issues noted.   Goal of Therapy:  Heparin level 0.3-0.7 units/ml Monitor platelets by anticoagulation protocol: Yes   Plan:  Continue heparin infusion at 2450 units/hr  Daily heparin level and CBC ordered Follow up long term anticoagulation plan  Barth Kirks, PharmD, [place] Pharmacist [phone]  Please check AMION for all [place] numbers  08/11/2021 7:57 AM

## 2021-08-11 NOTE — Progress Notes (Signed)
Patient refused 2 dressing changes on abdomen, became very upset and began hitting stating he wanted to sleep, once left alone he stopped, will try again.  Lesle Reek, RN

## 2021-08-11 NOTE — Progress Notes (Signed)
Patient refused dressing changes again, adamant he needs to sleep. Will pass on to next shift.  Lesle Reek, RN

## 2021-08-11 NOTE — Progress Notes (Signed)
Referring Physician(s): Minor, Gwyndolyn Saxon NP   Supervising Physician: Ruthann Cancer  Patient Status:  [facility] - In-pt  Chief Complaint:  Pneumoperitoneum due to ruptured pyogenic liver abscesses s/p ex lap with partial omenectomy and two drain placement with CCS on 11/18  F/u CT on 11/28 showed improving but persistent hepatic abscess, s/p hepatic abscess drain placement with IR on 12/1   Subjective:  Pt laying in bed, NAD.  Confused but arousable to verbal stimuli.  ROS not obtained.   Allergies: Patient has no known allergies.  Medications: Prior to Admission medications   Medication Sig Start Date End Date Taking? Authorizing Provider  aspirin EC 81 MG tablet Take 81 mg by mouth daily.   Yes [provider]  bisoprolol (ZEBETA) 10 MG tablet Take 1 tablet (10 mg total) by mouth daily. 12/23/20  Yes Lauree Chandler, NP  fenofibrate 160 MG tablet Take 1 tablet (160 mg total) by mouth daily. 12/23/20  Yes Lauree Chandler, NP  finasteride (PROSCAR) 5 MG tablet Take 1 tablet (5 mg total) by mouth daily. 12/23/20  Yes Lauree Chandler, NP  ibuprofen (ADVIL) 600 MG tablet Take 1 tablet (600 mg total) by mouth every 8 (eight) hours as needed. 07/15/21  Yes Ngetich, Dinah C, NP  insulin glargine (LANTUS SOLOSTAR) 100 UNIT/ML Solostar Pen Inject 58 Units into the skin at bedtime. 12/23/20  Yes Eubanks, Carlos American, NP  LORazepam (ATIVAN) 1 MG tablet TAKE 1 TABLET BY MOUTH ONCE DAILY AT BEDTIME AS NEEDED FOR ANXIETY Patient taking differently: Take 1 mg by mouth at bedtime as needed for anxiety. 07/17/21  Yes Ngetich, Dinah C, NP  losartan (COZAAR) 50 MG tablet Take 1 tablet by mouth once daily Patient taking differently: Take 50 mg by mouth daily. 08/06/20  Yes [name], Tiffany L, DO  metFORMIN (GLUCOPHAGE) 1000 MG tablet Take 1 tablet (1,000 mg total) by mouth 2 (two) times daily with a meal. 12/25/19  Yes [name], Tiffany L, DO  Multiple Vitamins-Minerals (MULTIVITAMIN MEN PO) Take 1  capsule by mouth daily.   Yes [provider]  NOVOLIN R FLEXPEN RELION 100 UNIT/ML FlexPen INJECT 18 UNITS SUBCUTANEOUSLY TWICE DAILY AS DIRECTED BEFORE A MEAL Patient taking differently: Inject 18 Units into the skin in the morning and at bedtime. 06/23/21  Yes Ngetich, Dinah C, NP  omeprazole (PRILOSEC) 20 MG capsule Take 20 mg by mouth daily.   Yes [provider]  senna-docusate (SENOKOT-S) 8.6-50 MG tablet Take 1 tablet by mouth 4 (four) times daily as needed (constipation from pain med). 08/08/18  Yes [name], Tiffany L, DO  SYMBICORT 80-4.5 MCG/ACT inhaler INHALE 2 PUFFS BY MOUTH TWICE DAILY IN  THE  MORNING  AND  12  HOURS  LATER Patient taking differently: Inhale 2 puffs into the lungs in the morning and at bedtime. 02/28/21  Yes Ngetich, Dinah C, NP  tamsulosin (FLOMAX) 0.4 MG CAPS capsule Take 1 capsule (0.4 mg total) by mouth daily. 04/09/21  Yes Ngetich, Dinah C, NP  Tiotropium Bromide-Olodaterol (STIOLTO RESPIMAT) 2.5-2.5 MCG/ACT AERS Inhale 2 puffs into the lungs daily. 05/19/19  Yes [name], Tiffany L, DO  zolpidem (AMBIEN) 5 MG tablet TAKE 1 TABLET BY MOUTH AT BEDTIME AS NEEDED FOR SLEEP Patient taking differently: Take 5 mg by mouth at bedtime as needed for sleep. 07/17/21  Yes Ngetich, Dinah C, NP  Accu-Chek FastClix Lancets MISC Use to test blood sugar three times daily. DX: E11.9 04/26/19   [name], Tiffany L, DO  Blood Glucose Monitoring Suppl (ACCU-CHEK AVIVA PLUS) w/Device KIT Use to test blood sugar three time daily. DX: E11.9 04/26/19   [name], Tiffany L, DO  glucose blood (ACCU-CHEK AVIVA PLUS) test strip Accu Chek Aviva Plus Test Strips Use to test blood sugar three times daily. DX: E11.9 10/16/19   [name], Tiffany L, DO     Vital Signs: BP (!) 117/53   Pulse 96   Temp 97.8 F (36.6 C) (Axillary)   Resp (!) 21   Ht $R'5\' 7"'NJ$  (1.702 m)   Wt 195 lb 5.2 oz (88.6 kg)   SpO2 92%   BMI 30.59 kg/m   Physical Exam Vitals reviewed.  Constitutional:      General: He is not  in acute distress.    Appearance: Normal appearance. He is ill-appearing.  HENT:     Head: Normocephalic and atraumatic.     Nose:     Comments: Cortrak  Pulmonary:     Effort: Pulmonary effort is normal.  Skin:    General: Skin is warm and dry.     Coloration: Skin is not jaundiced or pale.  Neurological:     Mental Status: He is alert.     Comments: Confused     Imaging: DG Chest Port 1 View  Result Date: 08/08/2021 CLINICAL DATA:  Respiratory failure. EXAM: PORTABLE CHEST 1 VIEW COMPARISON:  August 07, 2021. FINDINGS: Stable cardiomediastinal silhouette. Right lung is clear. Slightly improved left basilar atelectasis or pneumonia is noted. Right-sided PICC line is again noted. No pneumothorax is noted. Bony thorax is unremarkable. IMPRESSION: Slightly improved left basilar atelectasis or infiltrate. Electronically Signed   By: Marijo Conception M.D.   On: 08/08/2021 09:18   DG CHEST PORT 1 VIEW  Result Date: 08/07/2021 CLINICAL DATA:  Respiratory failure EXAM: PORTABLE CHEST 1 VIEW COMPARISON:  08/06/2021 FINDINGS: Progression of left lower lobe airspace consolidation as well as left effusion. Right lung remains clear. Negative for heart failure or edema Right arm PICC tip in the SVC unchanged. IMPRESSION: Progressive left lower lobe airspace disease and left effusion. Possible pneumonia. Electronically Signed   By: Franchot Gallo M.D.   On: 08/07/2021 15:43   DG Abd Portable 1V  Result Date: 08/11/2021 CLINICAL DATA:  Feeding tube insertion.  Check position. EXAM: PORTABLE ABDOMEN - 1 VIEW COMPARISON:  08/08/2021 FINDINGS: The feeding tube tip is right of the midline in the expected location of the descending duodenum. Percutaneous drainage catheter within the left hepatic lobe is identified just below the left hemidiaphragm. No dilated bowel loops identified. IMPRESSION: Feeding tube tip is in the descending duodenum. Electronically Signed   By: Kerby Moors M.D.   On: 08/11/2021  08:50   DG Abd Portable 1V  Result Date: 08/08/2021 CLINICAL DATA:  Feeding tube placement. EXAM: PORTABLE ABDOMEN - 1 VIEW COMPARISON:  Abdominal radiograph 07/29/2021 FINDINGS: Enteric tube tip crosses the midline, projecting at the expected location of the second portion of the duodenum. Nonobstructive bowel gas pattern. Percutaneous left hepatic drain is in stable position. Left lower lobe atelectasis in the visualized lung bases. IMPRESSION: Enteric tube tip is post-pyloric, projecting at the expected location of second portion of the duodenum. Electronically Signed   By: Ileana Roup M.D.   On: 08/08/2021 12:33   CT IMAGE GUIDED DRAINAGE BY PERCUTANEOUS CATHETER  Result Date: 08/07/2021 INDICATION: Hepatic abscess EXAM: CT-guided intra-abdominal drain placement into hepatic abscess MEDICATIONS: The patient is currently admitted to the hospital and receiving intravenous antibiotics. The antibiotics  were administered within an appropriate time frame prior to the initiation of the procedure. ANESTHESIA/SEDATION: Moderate (conscious) sedation was employed during this procedure. A total of Versed 2.5 mg and Fentanyl 75 mcg was administered intravenously by the radiology nurse. Total intra-service moderate Sedation Time: 21 minutes. The patient's level of consciousness and vital signs were monitored continuously by radiology nursing throughout the procedure under my direct supervision. COMPLICATIONS: None immediate. PROCEDURE: Informed written consent was obtained from the patient after a thorough discussion of the procedural risks, benefits and alternatives. All questions were addressed. Maximal Sterile Barrier Technique was utilized including caps, mask, sterile gowns, sterile gloves, sterile drape, hand hygiene and skin antiseptic. A timeout was performed prior to the initiation of the procedure. The patient was placed supine on the exam table. Limited CT of the upper abdomen was performed for planning  purposes. This again demonstrated a hypodense fluid collection in the left hepatic lobe with foci of gas, consistent with concern for abscess. Skin entry site was marked, and the overlying skin was prepped and draped in the standard sterile fashion. Local analgesia was obtained with 1% lidocaine. Using intermittent CT fluoroscopy, a 19 gauge Yueh needle was advanced into the abscess identified in the left hepatic lobe. Location was confirmed with CT and return of purulent material. Subsequently, over an Amplatz wire, the subcutaneous tract was dilated, followed by placement of a 12 French locking multipurpose drainage catheter. Location of the catheter within the abscess was confirmed with CT. Approximately 15 mL of thick purulent material was aspirated, with a sample sent to the lab for analysis. The catheter was secured to the skin using silk suture. A clean dressing was placed. The drainage catheter was attached to bulb suction. The patient tolerated the procedure well without immediate complication. IMPRESSION: Successful CT-guided placement of a 12 French locking drainage catheter within the liver abscess in the left hepatic lobe. Approximately 15 mL of purulent material was aspirated from the abscess cavity, with a sample sent to the lab for analysis. Electronically Signed   By: Albin Felling M.D.   On: 08/07/2021 16:20    Labs:  CBC: Recent Labs    08/06/21 0804 08/06/21 1502 08/07/21 0429 08/08/21 0454 08/09/21 0318  WBC 12.2*  --  10.2 8.4 8.6  HGB 10.3* 9.5* 9.3* 8.9* 9.0*  HCT 32.8* 28.0* 29.1* 28.1* 28.0*  PLT 264  --  242 211 206    COAGS: Recent Labs    07/25/21 1647 07/30/21 2012 08/05/21 0449 08/07/21 0430  INR 1.3* 1.4* 1.2 1.2  APTT 26 29  --   --     BMP: Recent Labs    12/18/20 0953 02/04/21 0959 04/18/21 1353 08/09/21 0318 08/09/21 2000 08/10/21 0401 08/11/21 0358  NA 137 136   < > 135 134* 134* 134*  K 4.3 4.7   < > 4.0 3.8 3.9 3.9  CL 99 98   < > 99  97* 98 99  CO2 27 28   < > 32 _0 GLUCOSE 367* 474*   < > 92 144* 104* 122*  BUN 12 12   < > 27* _1 CALCIUM 9.6 9.9   < > 7.8* 8.0* 7.8* 7.9*  CREATININE 0.69* 0.76   < > 0.54* 0.56* 0.47* 0.54*  GFRNONAA 96 91   < > >60 >60 >60 >60  GFRAA 111 106  --   --   --   --   --    < > =  values in this interval not displayed.    LIVER FUNCTION TESTS: Recent Labs    08/03/21 0304 08/04/21 0223 08/07/21 0429 08/11/21 0358  BILITOT 0.6 0.5 0.2* 0.4  AST _0 ALT _1 ALKPHOS 110 114 127* 111  PROT 4.5* 5.1* 5.1* 5.3*  ALBUMIN <1.5* <1.5* <1.5* <1.5*    Assessment and Plan:  71 y.o. male with ruptured pyogenic liver abscesses; s/p ex lap with omentectomy and two drain placement with CCS; improving but persistent hepatic abscess s/p drain placement with IR on 12/1.    VS tachycardia and tachypnea, afebrile  CBC on 12/3 showed no leukocytosis  Cx ENTEROCOCCUS FAECALIS   Drain Location: RUQ Size: Fr size: 12 Fr Date of placement: 12/1  Currently to: Drain collection device: suction bulb 24 hour output:  Output by Drain (mL) 08/09/21 0701 - 08/09/21 1900 08/09/21 1901 - 08/10/21 0700 08/10/21 0701 - 08/10/21 1900 08/10/21 1901 - 08/11/21 0700 08/11/21 0701 - 08/11/21 1118  Closed System Drain 1 Medial;Right RUQ Bulb (JP) 19 Fr. 10 20 0 0   Closed System Drain 2 Lateral LUQ Bulb (JP) 19 Fr. _2 Closed System Drain 3 Superior;Midline Abdomen Bulb (JP) 12 Fr. 10  10      Interval imaging/drain manipulation:  None   Current examination: Flushes/aspirates easily.  Insertion site unremarkable. Suture and stat lock in place. Dressed appropriately.   Plan: Continue TID flushes with 5 cc NS. Record output Q shift. Dressing changes QD or PRN if soiled.  Call IR APP or on call IR MD if difficulty flushing or sudden change in drain output.   OP 10 cc x 2 days, will defer the timing of f/u imaging to CCS   Discharge planning: Please contact IR APP  or on call IR MD prior to patient d/c to ensure appropriate follow up plans are in place. Typically patient will follow up with IR clinic 10-14 days post d/c for repeat imaging/possible drain injection. IR scheduler will contact patient with date/time of appointment. Patient will need to flush drain QD with 5 cc NS, record output QD, dressing changes every 2-3 days or earlier if soiled.   IR will continue to follow - please call with questions or concerns.  Electronically Signed: Tera Mater, PA-C 08/11/2021, 10:59 AM   I spent a total of 15 Minutes at the the patient's bedside AND on the patient's hospital floor or unit, greater than 50% of which was counseling/coordinating care for RUQ drain   This chart was dictated using voice recognition software.  Despite best efforts to proofread,  errors can occur which can change the documentation meaning.

## 2021-08-11 NOTE — Progress Notes (Signed)
PHARMACY - TOTAL PARENTERAL NUTRITION CONSULT NOTE   Indication: Prolonged ileus  Patient Measurements: Height: 5\' 7"  (170.2 cm) Weight: 88.6 kg (195 lb 5.2 oz) IBW/kg (Calculated) : 66.1 TPN AdjBW (KG): 73.7 Body mass index is 30.59 kg/m.  Assessment: 30 yom with perforated liver abscess s/p partial omentectomy and drains in the right and left upper quadrants.  Speech evaluated for ability to swallow and deemed moderate aspiration risk with NPO status. KUB on 11/21 with possible ileus vs obstructive with worsening on 11/22 imaging. CCM wanted to hold off on TPN start 11/22 and push bowel regimen - BM documented but not significant, smear only per CCM. Pharmacy consulted to initiate TPN 11/23.  Glucose / Insulin: hx uncontrolled DM2. A1c 12.2% (PTA regimen: Lantus 58 units QHS + Novolog 18 units qAC + metformin but unclear compliance). CBGs 100-150s, 60u TPN in bag Electrolytes: K 3.9 (goal >/=4 with ileus), Mg 1.8 (goal >/=2 with ileus), CoCa ~9.9, Phos 4.3, Bicarb 30, others WNL Renal: SCr stable wnl. BUN WNL.  Hepatic: ALT/AST WNL. Tbili stable. Prealbumin <5, albumin <1.5, TG 75  Intake / Output; MIVF: Coretrak placed 12/2 (issues with pressures) Drain output up at 35 mL/24hrs, LBM 12/2 x2, UOP 1.3 mL/kg/hr.   GI Imaging: 11/23 [place] and MR of abdomen - interval worsening of small bowel dilation suggesting ileus or partial obstruction 11/28 CT ab/pelvis- liver lesions consistent with history of liver abscesses, ascites, nonobstructive thrombus (known) GI Surgeries / Procedures: none since consult for TPN   Central access: PICC planned 11/23 TPN start date: 11/23  Nutritional Goals: Goal TPN rate is 90 mL/hr (provides 125 g of protein and 2200 kcals per day)   RD Assessment: Estimated Needs Total Energy Estimated Needs: 2200-2400 Total Protein Estimated Needs: 125-145 grams Total Fluid Estimated Needs: >2L  Current Nutrition:  12/2 Cortrak placed, TF initiated, but started  having issues with the tube.  Will need to be replaced. Patient also getting swallow eval today  Plan:  - Reduce TPN to 45 mL/hr at 1800 - this will ensure patient gets some nutrition if cortrak cannot be fixed vs fails speech eval - Electrolytes in TPN: Na 50 mEq/L, K 50 mEq/L, Ca 5 mEq/L, Mg 10 mEq/L, and Phos 10 mmol/L. Cl:Ac 1:1 - Add standard MVI and trace elements to TPN.  - Add Pepcid 40mg  to TPN  - CBGs improved; Moderate q4h SSI and 30 units regular insulin in TPN bag . F/u CBGs and adjust as needed.  (Reduce by half d/t change in rate) - Mag 2 gm IV x1 - Monitor TPN labs on Mon/Thurs - Planning to stop Tues if patient gets TF or diet  Barth Kirks, PharmD, BCCCP Clinical Pharmacist [phone]  Please check AMION for all [place] numbers  08/11/2021 11:05 AM

## 2021-08-11 NOTE — Procedures (Signed)
Cortrak  Person Inserting Tube:  Shanik Brookshire, Creola Corn, RD Tube Type:  Cortrak - 43 inches Tube Size:  10 Tube Location:  Left nare Initial Placement:  Postpyloric Secured by: Bridle Technique Used to Measure Tube Placement:  Marking at nare/corner of mouth Cortrak Secured At:  100 cm  Cortrak Tube Team Note:  Consult received to place a Cortrak feeding tube.   X-ray is required, abdominal x-ray has been ordered by the Cortrak team. Please confirm tube placement before using the Cortrak tube.   If the tube becomes dislodged please keep the tube and contact the Cortrak team at [email or web address] (password [email or web address]) for replacement.  If after hours and replacement cannot be delayed, place a NG tube and confirm placement with an abdominal x-ray.     Theone Stanley., MS, RD, LDN (she/her/hers) RD pager number and weekend/on-call pager number located in [place].
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
## 2021-08-11 NOTE — Progress Notes (Signed)
SLP Cancellation Note  Patient Details Name: Matthew Dominguez MRN: [id number] DOB: [birth year]   Cancelled treatment:       Reason Eval/Treat Not Completed: Other (comment). Pts AMS persists, still on and off BiPAP and HHFNC. Need clearer mentation and respiration for successful, safe swallowing therapy.    Davell Beckstead, Katherene Ponto 08/11/2021, 11:51 AM

## 2021-08-11 NOTE — Progress Notes (Signed)
Nutrition Follow-up  DOCUMENTATION CODES:   Non-severe (moderate) malnutrition in context of chronic illness  INTERVENTION:   Initiate tube feeds via post-pyloric Cortrak: - Start Osmolite 1.5 @ 20 ml/hr and advance by 10 ml q 6 hours to goal rate of 60 ml/hr (1440 ml/day) - ProSource TF 45 ml QID  Tube feeding regimen at goal rate provides 2320 kcal, 134 grams of protein, and 1097 ml of H2O.  - Continue TPN per Pharmacy; when tolerating TF at half goal rate, recommend start weaning TPN  NUTRITION DIAGNOSIS:   Moderate Malnutrition related to chronic illness as evidenced by mild fat depletion, moderate fat depletion, mild muscle depletion, moderate muscle depletion.  Ongoing, being addressed via TPN and TF  GOAL:   Patient will meet greater than or equal to 90% of their needs  Met via TPN and TF  MONITOR:   Labs, Weight trends, I & O's, Skin, Diet advancement  REASON FOR ASSESSMENT:   Consult Enteral/tube feeding initiation and management  ASSESSMENT:   Pt admitted with sepsis and hepatitis 2/2 ruptured pyogenic liver abscesses. PMH includes HTN, HLD, DM, COPD, hypothyroidism, mild cognitive impairment.  11/18 - s/p ex-lap and drainage of pyogenic liver abscesses and partial omentectomy, intubated 11/19 - extubated 11/22 - unsuccessful attempts at NG tube placement by RN 11/23 - plan for PICC and start TPN 12/01 - CT drain left hepatic lobe abscess 12/02 - Cortrak placed (tip post-pyloric), FEES with recommendations for NPO 12/03 - TF started at trickle rate 12/04 - TF stopped due to issues with Cortrak tube 12/05 - Cortrak leaking yellow fluid, Cortrak replaced (tip at duodenojejunal junction), SLP continues to recommend NPO  Consult received for tube feeding initiation and management. KUB obtained this morning shows Cortrak remains post-pyloric ("in the descending duodenum"). Discussed plan with Surgery. RN started tube feeds at trickle rate. Cortrak began to leak  yellow fluid and tube feeds would not stay attached to Cortrak tube due to pressure. Cortrak team replaced Cortrak tube, tip is now at duodenojejunal junction. RN to restart trickle tube feeds and slowly advance to goal rate per orders.  TPN currently infusing at goal rate of 90 ml/hr which provides 2200 kcal and 125 grams of protein daily. At 1800 today, TPN to be reduced to half rate of 45 ml/hr since tube feeds have been started via Cortrak.  SLP saw pt again today. Evaluation not completed as "Pt's AMS persists, still on and off BiPAP and HHFNC. Need clearer mentation and respiration for successful, safe swallowing therapy." RD will follow along for diet advancement and oral nutrition supplements as able.  Admit weight: 84 kg Current weight: 88.6 kg  Weight up ~4 kg since admission. Suspect weight gain is related to positive fluid balance given pt is net positive 12.6 L since admission. Pt with mild pitting edema to BUE and non-pitting generalized edema.  Medications reviewed and include: colace, IV lasix 80 mg BID, SSI q 4 hours, IV abx, heparin drip, TPN  Labs reviewed: sodium 134, creatinine 0.54 CBG's: 91-162 x 24 hours  UOP: 2900 ml x 24 hours RUQ JP drain: 0 ml x 24 hours LUQ JP drain: 15 ml x 24 hours Midline abdomen JP drain: 10 ml x 24 hours I/O's: +12.6 ml since admit  Diet Order:   Diet Order             Diet NPO time specified Except for: Sips with Meds  Diet effective now  EDUCATION NEEDS:   No education needs have been identified at this time  Skin:  Skin Assessment: Skin Integrity Issues: DTI: coccyx Unstageable: bilateral knees and elbows Incisions: abdomen  Last BM:  08/11/21 type 6 via rectal tube  Height:   Ht Readings from Last 1 Encounters:  07/30/21 5' 7"  (1.702 m)    Weight:   Wt Readings from Last 1 Encounters:  08/11/21 88.6 kg    BMI:  Body mass index is 30.59 kg/m.  Estimated Nutritional Needs:   Kcal:   2200-2400  Protein:  125-145 grams  Fluid:  >2L    Gustavus Bryant, MS, RD, LDN Inpatient Clinical Dietitian Please see AMiON for contact information.

## 2021-08-11 NOTE — Progress Notes (Addendum)
17 Days Post-Op  Subjective: On HFNC this am.  Had issues with connector popping off Cortrak while on BiPap.  Moving his bowels.  Denies nausea.  Objective: Vital signs in last 24 hours: Temp:  [97.8 F (36.6 C)-99.1 F (37.3 C)] 97.8 F (36.6 C) (12/05 0712) Pulse Rate:  [75-106] 89 (12/05 0734) Resp:  [17-34] 24 (12/05 0734) BP: (109-148)/(41-69) 137/56 (12/05 0700) SpO2:  [89 %-100 %] 91 % (12/05 0747) FiO2 (%):  [30 %-60 %] 60 % (12/05 0747) Weight:  [88.6 kg] 88.6 kg (12/05 0432) Last BM Date: 08/10/21  Intake/Output from previous day: 12/04 0701 - 12/05 0700 In: 3048.7 [I.V.:2743.6; IV Piggyback:300.1] Out: 2925 [Urine:2900; Drains:25] Intake/Output this shift: No intake/output data recorded.  PE: Gen:  Awake and alert, on HFNC Abd: Soft, minimally tender. RUQ gravity bag has been broken.  Connector piece cut out and hemostat placed over tubing until new bag can be replaced. and perc drain with tan purulent fluid. LUQ drain with serous fluid in bulb without gross bile. Midline incision with clean dry dressing in place.   Lab Results:  Recent Labs    08/09/21 0318  WBC 8.6  HGB 9.0*  HCT 28.0*  PLT 206   BMET Recent Labs    08/10/21 0401 08/11/21 0358  NA 134* 134*  K 3.9 3.9  CL 98 99  CO2 30 31  GLUCOSE 104* 122*  BUN 20 18  CREATININE 0.47* 0.54*  CALCIUM 7.8* 7.9*   PT/INR No results for input(s): LABPROT, INR in the last 72 hours.  CMP     Component Value Date/Time   NA 134 (L) 08/11/2021 0358   NA 142 01/15/2016 1031   K 3.9 08/11/2021 0358   CL 99 08/11/2021 0358   CO2 31 08/11/2021 0358   GLUCOSE 122 (H) 08/11/2021 0358   BUN 18 08/11/2021 0358   BUN 10 01/15/2016 1031   CREATININE 0.54 (L) 08/11/2021 0358   CREATININE 0.74 04/18/2021 1353   CALCIUM 7.9 (L) 08/11/2021 0358   PROT 5.3 (L) 08/11/2021 0358   PROT 6.2 01/15/2016 1031   ALBUMIN <1.5 (L) 08/11/2021 0358   ALBUMIN 4.1 01/15/2016 1031   AST 25 08/11/2021 0358   ALT  17 08/11/2021 0358   ALKPHOS 111 08/11/2021 0358   BILITOT 0.4 08/11/2021 0358   BILITOT 0.3 01/15/2016 1031   GFRNONAA >60 08/11/2021 0358   GFRNONAA 91 02/04/2021 0959   GFRAA 106 02/04/2021 0959   Lipase     Component Value Date/Time   LIPASE 36 07/27/2021 0420    Studies/Results: No results found.  Anti-infectives: Anti-infectives (From admission, onward)    Start     Dose/Rate Route Frequency Ordered Stop   08/09/21 1600  Ampicillin-Sulbactam (UNASYN) 3 g in sodium chloride 0.9 % 100 mL IVPB        3 g 200 mL/hr over 30 Minutes Intravenous Every 8 hours 08/09/21 1441     07/29/21 1800  metroNIDAZOLE (FLAGYL) IVPB 500 mg  Status:  Discontinued        500 mg 100 mL/hr over 60 Minutes Intravenous Every 12 hours 07/29/21 0930 08/09/21 1422   07/29/21 1400  cefTRIAXone (ROCEPHIN) 2 g in sodium chloride 0.9 % 100 mL IVPB  Status:  Discontinued        2 g 200 mL/hr over 30 Minutes Intravenous Every 24 hours 07/29/21 0945 08/09/21 1422   07/26/21 1400  ceFEPIme (MAXIPIME) 2 g in sodium chloride 0.9 % 100 mL IVPB  Status:  Discontinued        2 g 200 mL/hr over 30 Minutes Intravenous Every 8 hours 07/26/21 0721 07/29/21 0945   07/26/21 0800  ceFEPIme (MAXIPIME) 2 g in sodium chloride 0.9 % 100 mL IVPB  Status:  Discontinued        2 g 200 mL/hr over 30 Minutes Intravenous Every 12 hours 07/25/21 1853 07/26/21 0213   07/26/21 0600  metroNIDAZOLE (FLAGYL) IVPB 500 mg  Status:  Discontinued        500 mg 100 mL/hr over 60 Minutes Intravenous Every 8 hours 07/26/21 0119 07/29/21 0930   07/26/21 0600  ceFEPIme (MAXIPIME) 2 g in sodium chloride 0.9 % 100 mL IVPB  Status:  Discontinued        2 g 200 mL/hr over 30 Minutes Intravenous Every 12 hours 07/26/21 0225 07/26/21 0721   07/25/21 1900  ceFEPIme (MAXIPIME) 2 g in sodium chloride 0.9 % 100 mL IVPB        2 g 200 mL/hr over 30 Minutes Intravenous  Once 07/25/21 1845 07/25/21 2015   07/25/21 1900  metroNIDAZOLE (FLAGYL) IVPB 500  mg        500 mg 100 mL/hr over 60 Minutes Intravenous  Once 07/25/21 1845 07/25/21 2138        Assessment/Plan POD 16, s/p Exploratory laparotomy with drainage of pyogenic liver abscess x 2, Partial omentectomy for Ruptured pyogenic liver abscesses by Dr. Dema Severin - 07/25/2021 - Cont IV abx per ID - Drains in place, seropurulent minimal output. - restart TFs today now off BiPap.  Discussed with pharmacy to start to wean TNA as tolerates TFs until safe to take PO - Cont WTD BID to midline - Therapies, mobilize - Pulm toilet -RN to contact IR for new gravity bag     HTN  Dementia/delirium/agitation - on Seroquel Acute respiratory failure - on HFNC COPD - On scheduled duonebs.  GERD DM2 Prior hx IVDU (1970's)   LOS: 53 days    Henreitta Cea , MD [facility] Surgery 08/11/2021, 8:00 AM Please see Amion for pager number during day hours 7:00am-4:30pm

## 2021-08-11 NOTE — Consult Note (Signed)
[facility] Nurse wound follow up Wound type:Unstageable Pressure Injury; sacrum  Measurement: 6cm x 9cm x 0.2cm Wound bed: 100% fibrinous material; very adherent  Drainage (amount, consistency, odor) minimal, serous  Periwound:intact  Dressing procedure/placement/frequency: Continue Santyl, WOC nurse to reassess weekly  LALM in place for moisture management and pressure redistribution   WOC Nurse team will follow with you and see patient within 10 days for wound assessments.  Please notify [facility] nurses of any acute changes in the wounds or any new areas of concern [place] MSN, [place], [place], [place]

## 2021-08-12 ENCOUNTER — Inpatient Hospital Stay (HOSPITAL_COMMUNITY): Payer: Medicare HMO

## 2021-08-12 ENCOUNTER — Encounter (HOSPITAL_COMMUNITY): Payer: Self-pay | Admitting: Radiology

## 2021-08-12 DIAGNOSIS — R6520 Severe sepsis without septic shock: Secondary | ICD-10-CM | POA: Diagnosis not present

## 2021-08-12 DIAGNOSIS — A419 Sepsis, unspecified organism: Secondary | ICD-10-CM | POA: Diagnosis not present

## 2021-08-12 LAB — GLUCOSE, CAPILLARY
Glucose-Capillary: 112 mg/dL — ABNORMAL HIGH (ref 70–99)
Glucose-Capillary: 122 mg/dL — ABNORMAL HIGH (ref 70–99)
Glucose-Capillary: 151 mg/dL — ABNORMAL HIGH (ref 70–99)
Glucose-Capillary: 177 mg/dL — ABNORMAL HIGH (ref 70–99)
Glucose-Capillary: 214 mg/dL — ABNORMAL HIGH (ref 70–99)
Glucose-Capillary: 224 mg/dL — ABNORMAL HIGH (ref 70–99)

## 2021-08-12 LAB — AEROBIC/ANAEROBIC CULTURE W GRAM STAIN (SURGICAL/DEEP WOUND): Gram Stain: NONE SEEN

## 2021-08-12 LAB — CBC
HCT: 29.9 % — ABNORMAL LOW (ref 39.0–52.0)
Hemoglobin: 9.3 g/dL — ABNORMAL LOW (ref 13.0–17.0)
MCH: 30.8 pg (ref 26.0–34.0)
MCHC: 31.1 g/dL (ref 30.0–36.0)
MCV: 99 fL (ref 80.0–100.0)
Platelets: 214 10*3/uL (ref 150–400)
RBC: 3.02 MIL/uL — ABNORMAL LOW (ref 4.22–5.81)
RDW: 16.4 % — ABNORMAL HIGH (ref 11.5–15.5)
WBC: 8.7 10*3/uL (ref 4.0–10.5)
nRBC: 0 % (ref 0.0–0.2)

## 2021-08-12 LAB — HEPARIN LEVEL (UNFRACTIONATED): Heparin Unfractionated: 0.58 IU/mL (ref 0.30–0.70)

## 2021-08-12 LAB — PHOSPHORUS: Phosphorus: 4.1 mg/dL (ref 2.5–4.6)

## 2021-08-12 LAB — MAGNESIUM: Magnesium: 1.8 mg/dL (ref 1.7–2.4)

## 2021-08-12 MED ORDER — IOHEXOL 300 MG/ML  SOLN
100.0000 mL | Freq: Once | INTRAMUSCULAR | Status: AC | PRN
  Administered 2021-08-12: 100 mL via INTRAVENOUS

## 2021-08-12 MED ORDER — QUETIAPINE FUMARATE 100 MG PO TABS
100.0000 mg | ORAL_TABLET | Freq: Two times a day (BID) | ORAL | Status: DC
  Administered 2021-08-12 – 2021-08-14 (×5): 100 mg via ORAL
  Filled 2021-08-12 (×5): qty 1

## 2021-08-12 MED ORDER — CHLORHEXIDINE GLUCONATE 0.12 % MT SOLN
15.0000 mL | Freq: Two times a day (BID) | OROMUCOSAL | Status: DC
  Administered 2021-08-12 – 2021-08-14 (×5): 15 mL via OROMUCOSAL
  Filled 2021-08-12 (×4): qty 15

## 2021-08-12 MED ORDER — FENTANYL CITRATE PF 50 MCG/ML IJ SOSY
25.0000 ug | PREFILLED_SYRINGE | INTRAMUSCULAR | Status: DC | PRN
Start: 2021-08-12 — End: 2021-08-14

## 2021-08-12 MED ORDER — ORAL CARE MOUTH RINSE
15.0000 mL | Freq: Two times a day (BID) | OROMUCOSAL | Status: DC
  Administered 2021-08-12 – 2021-08-14 (×5): 15 mL via OROMUCOSAL

## 2021-08-12 NOTE — Progress Notes (Signed)
Physical Therapy Treatment Patient Details Name: Matthew Dominguez MRN: [id number] DOB: [birth year] Today's Date: 08/12/2021   History of Present Illness 70 yo admitted 11/18 after found down at home with AMS. Pt with sepsis due to ruptured liver abscess s/p ex lap with drainage same date. Pt intubated 11/18-11/19.  11/29 with respiratory failure, now on/off Bipap.  12/1 on HHFNC. PMhx: DM, HTN, COPD, mild cognitive impairment    PT Comments    Pt on 8L HFNC throughout with sats >95%. Pt remains confused and did receive seroquel today with decreased activity tolerance requiring max +2 assist for mobility and lack of awareness for B/B. Pt with goals downgraded this session with D/C plan still appropriate.     Recommendations for follow up therapy are one component of a multi-disciplinary discharge planning process, led by the attending physician.  Recommendations may be updated based on patient status, additional functional criteria and insurance authorization.  Follow Up Recommendations  Skilled nursing-short term rehab (<3 hours/day)     Assistance Recommended at Discharge Frequent or [facility] bed;Wheelchair (measurements PT);Wheelchair cushion (measurements PT);Other (comment)    Recommendations for Other Services       Precautions / Restrictions Precautions Precautions: Fall;Other (comment) Precaution Comments: watch sats, HFNC, bil mittens, posey, 3 drains     Mobility  Bed Mobility Overal bed mobility: Needs Assistance Bed Mobility: Rolling;Supine to Sit Rolling: Min assist;Max assist         General bed mobility comments: max assist initial rolling to right with pt able to roll to side and assist to drop legs off but then refused lifting trunk from surface. Pt independently lifting legs to surface with assist to position in midline. Pt with incontinent BM and then min assist bil rolling for pericare. Pt mod +2 to  slide toward HOB. Mod +2 to transition trunk off surface with anterior translation in full chair with bil rails x 2 for posey placement    Transfers                   General transfer comment: unable    Ambulation/Gait                   Stairs             Wheelchair Mobility    Modified Rankin (Stroke Patients Only)       Balance Overall balance assessment: Needs assistance                                          Cognition Arousal/Alertness: Awake/alert Behavior During Therapy: Flat affect Overall Cognitive Status: Impaired/Different from baseline Area of Impairment: Orientation;Attention;Following commands;Safety/judgement;Problem solving;Memory;Awareness                 Orientation Level: Disoriented to;Place;Time;Situation Current Attention Level: Focused Memory: Decreased recall of precautions;Decreased short-term memory Following Commands: Follows one step commands inconsistently;Follows one step commands with increased time Safety/Judgement: Decreased awareness of safety;Decreased awareness of deficits   Problem Solving: Slow processing;Requires verbal cues;Requires tactile cues;Decreased initiation;Difficulty sequencing General Comments: pt not oriented, following limited single step commands        Exercises      General Comments        Pertinent Vitals/Pain Pain Assessment: CPOT Facial Expression: Tense Body Movements: Protection Muscle Tension: Tense, rigid Compliance with ventilator (intubated pts.): N/A Vocalization (extubated  pts.): Talking in normal tone or no sound CPOT Total: 3    Home Living                          Prior Function            PT Goals (current goals can now be found in the care plan section) Acute Rehab PT Goals Time For Goal Achievement: 08/26/21 Progress towards PT goals: Not progressing toward goals - comment;Goals downgraded-see care plan (pt remains  limited by cognition)    Frequency    Min 2X/week      PT Plan Current plan remains appropriate    Co-evaluation              AM-PAC PT "6 Clicks" Mobility   Outcome Measure  Help needed turning from your back to your side while in a flat bed without using bedrails?: A Lot Help needed moving from lying on your back to sitting on the side of a flat bed without using bedrails?: Total Help needed moving to and from a bed to a chair (including a wheelchair)?: Total Help needed standing up from a chair using your arms (e.g., wheelchair or bedside chair)?: Total Help needed to walk in hospital room?: Total Help needed climbing 3-5 steps with a railing? : Total 6 Click Score: 7    End of Session Equipment Utilized During Treatment: Oxygen Activity Tolerance: Patient limited by fatigue Patient left: in bed;with call bell/phone within reach;with bed alarm set;with nursing/sitter in room;with restraints reapplied Nurse Communication: Mobility status;Need for lift equipment PT Visit Diagnosis: Other abnormalities of gait and mobility (R26.89);Muscle weakness (generalized) (M62.81)     Time: 3833-3832 PT Time Calculation (min) (ACUTE ONLY): 34 min  Charges:  $Therapeutic Activity: 23-37 mins                     Willine Schwalbe P, PT Acute Rehabilitation Services Pager: [phone] Office: [facility] 08/12/2021, 11:58 AM

## 2021-08-12 NOTE — Progress Notes (Addendum)
NCM received consult for LTAC. NCM spoke with POC , daughter Olivia Mackie. Olivia Mackie states mom is @ SNF [facility]. NCM spoke with daughter regarding pt transitioning to a LTAC. Daughter agreeable to LTAC. [facility] offered. [facility]. Referral made with [facility], approval pending. Harl Bowie (Daughter)     [phone]     [facility] team monitoring and will assist with needs. Whitman Hero RN,BSN,CM

## 2021-08-12 NOTE — Progress Notes (Signed)
ANTICOAGULATION CONSULT NOTE  Pharmacy Consult for Heparin Indication: Portal Vein Thrombosis   No Known Allergies  Patient Measurements: Height: 5\' 7"  (170.2 cm) Weight: 89.2 kg (196 lb 10.4 oz) IBW/kg (Calculated) : 66.1 Heparin Dosing Weight: 73 kg   Vital Signs: Temp: 98.2 F (36.8 C) (12/06 0704) Temp Source: Axillary (12/06 0704) BP: 136/54 (12/06 0700) Pulse Rate: 78 (12/06 0730)  Labs: Recent Labs    08/09/21 2000 08/10/21 0401 08/11/21 0357 08/11/21 0358 08/12/21 0414  HGB  --   --   --   --  9.3*  HCT  --   --   --   --  29.9*  PLT  --   --   --   --  214  HEPARINUNFRC  --  0.55 0.51  --  0.58  CREATININE 0.56* 0.47*  --  0.54*  --      Estimated Creatinine Clearance: 88.9 mL/min (A) (by C-G formula based on SCr of 0.54 mg/dL (L)).   Assessment: 70 yo male presented on 07/25/2021 after being found down with AMS found to have perforated liver abscess s/p partial omentectomy and drain placement. MRI with suspected portal vein thrombosis. Results of MRI dicussed with CCM and surgery.   Heparin level therapeutic at 0.58 on infusion at 2450 units/hr. No bleeding or IV issues noted.   Goal of Therapy:  Heparin level 0.3-0.7 units/ml Monitor platelets by anticoagulation protocol: Yes   Plan:  Continue heparin infusion at 2450 units/hr  Daily heparin level and CBC ordered Follow up long term anticoagulation plan  Barth Kirks, PharmD, [facility] Pharmacist [phone]  Please check AMION for all [facility] numbers  08/12/2021 7:58 AM
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
## 2021-08-12 NOTE — Progress Notes (Addendum)
Nutrition Follow-up  DOCUMENTATION CODES:   Non-severe (moderate) malnutrition in context of chronic illness  INTERVENTION:   Continue TF via Cortrak tube:  Osmolite 1.5 increasing by 10 ml every 6 hours to goal rate of 60 ml/h (1440 ml per day) Prosource TF 45 ml QID  Provides 2320 kcal, 134 gm protein, 1097 ml free water daily  NUTRITION DIAGNOSIS:   Moderate Malnutrition related to chronic illness as evidenced by mild fat depletion, moderate fat depletion, mild muscle depletion, moderate muscle depletion.  Ongoing  GOAL:   Patient will meet greater than or equal to 90% of their needs  Progressing   MONITOR:   Labs, Weight trends, I & O's, Skin, Diet advancement  REASON FOR ASSESSMENT:   Consult Enteral/tube feeding initiation and management  ASSESSMENT:   Pt admitted with sepsis and hepatitis 2/2 ruptured pyogenic liver abscesses. PMH includes HTN, HLD, DM, COPD, hypothyroidism, mild cognitive impairment.  Discussed patient in ICU rounds and with RN today. Patient on HFNC this morning; requiring BiPAP at night.  TF was held overnight d/t tube not working with BiPAP mask on. Per RN the [place] valve on the tube was not on correctly, so she tightened/replaced it and TF has been infusing without difficulty this morning. Discussed with Surgery PA. Increasing TF to goal rate today.  Cortrak tube replaced 12/6; tip is at the duodenal  jejunal junction.   TPN is being discontinued today.   Labs reviewed.  CBG: [phone]  Medications reviewed and include Lasix, Novolog, IV antibiotics.  Admission weight 84 kg Current weight 89.2 kg   I/O +12.9 L since admission Three drains in place, total output 20 ml x 24 hours. BM x 3 in the past 24 hours. 1 BM documented so far today.   Diet Order:   Diet Order             Diet NPO time specified Except for: Sips with Meds  Diet effective now                   EDUCATION NEEDS:   No education needs have  been identified at this time  Skin:  Skin Assessment: Skin Integrity Issues: Skin Integrity Issues:: Unstageable, Incisions DTI: - Unstageable: bilateral knees and elbows, coccyx Incisions: abdomen  Last BM:  12/6 type 6  Height:   Ht Readings from Last 1 Encounters:  07/30/21 5\' 7"  (1.702 m)    Weight:   Wt Readings from Last 1 Encounters:  08/12/21 89.2 kg    BMI:  Body mass index is 30.8 kg/m.  Estimated Nutritional Needs:   Kcal:  2200-2400  Protein:  125-145 grams  Fluid:  >2L    Lucas Mallow, RD, LDN, CNSC Please refer to Amion for contact information.

## 2021-08-12 NOTE — Progress Notes (Signed)
Occupational Therapy Treatment Patient Details Name: Matthew Dominguez MRN: [id number] DOB: 01/30/1951 Today's Date: 08/12/2021   History of present illness 70 yo admitted 11/18 after found down at home with AMS. Pt with sepsis due to ruptured liver abscess s/p ex lap with drainage same date. Pt intubated 11/18-11/19.  11/29 with respiratory failure, now on/off Bipap.  12/1 on HHFNC. PMhx: DM, HTN, COPD, mild cognitive impairment   OT comments  Patient supine in bed, pleasantly confused.  Engaged with OT, oriented to self and place (given 2 choices). Follows 1 step commands intermittently during session, but poor sustained attention to tasks. He requires + 2 to scoot up in bed.  Max assist with hand over hand support to comb hair, noted becoming increasingly restless and attempting to pull at restraints.  Pt reports discomfort in back/buttocks, RN notified, and attempted to reposition.  Will follow acutely.    Recommendations for follow up therapy are one component of a multi-disciplinary discharge planning process, led by the attending physician.  Recommendations may be updated based on patient status, additional functional criteria and insurance authorization.    Follow Up Recommendations  Skilled nursing-short term rehab (<3 hours/day)    Assistance Recommended at Discharge Frequent or constant Supervision/Assistance  Equipment Recommendations  None recommended by OT    Recommendations for Other Services      Precautions / Restrictions Precautions Precautions: Fall;Other (comment) Precaution Comments: watch sats, HFNC, bil mittens, posey, 3 drains Restrictions Weight Bearing Restrictions: No       Mobility Bed Mobility Overal bed mobility: Needs Assistance Bed Mobility: Rolling Rolling: Min assist         General bed mobility comments: min assist to roll towards R side to reposition, total +2 to scoot up in bed    Transfers                   General transfer comment:  unable     Balance Overall balance assessment: Needs assistance                                         ADL either performed or assessed with clinical judgement   ADL Overall ADL's : Needs assistance/impaired Eating/Feeding: NPO   Grooming: Wash/dry face;Brushing hair;Maximal assistance Grooming Details (indicate cue type and reason): upright in bed with hand over hand to comb hair             Lower Body Dressing: Total assistance;+2 for physical assistance;+2 for safety/equipment;Bed level               Functional mobility during ADLs: Total assistance;+2 for physical assistance;+2 for safety/equipment General ADL Comments: remained bed level, total +2 to scoot up in bed    Extremity/Trunk Assessment              Vision       Perception     Praxis      Cognition Arousal/Alertness: Awake/alert Behavior During Therapy: Flat affect;Restless;Impulsive Overall Cognitive Status: Impaired/Different from baseline Area of Impairment: Orientation;Attention;Following commands;Safety/judgement;Problem solving;Memory;Awareness                 Orientation Level: Disoriented to;Time;Situation Current Attention Level: Focused Memory: Decreased recall of precautions;Decreased short-term memory Following Commands: Follows one step commands inconsistently;Follows one step commands with increased time Safety/Judgement: Decreased awareness of safety;Decreased awareness of deficits Awareness: Intellectual Problem Solving: Slow processing;Decreased initiation;Difficulty sequencing;Requires verbal  cues;Requires tactile cues General Comments: pt oriented to self and place (with choices), restless, following very minimal commands          Exercises     Shoulder Instructions       General Comments 8L HFNC VSS    Pertinent Vitals/ Pain       Pain Assessment: Faces Faces Pain Scale: Hurts little more Facial Expression: Tense Body Movements:  Protection Muscle Tension: Tense, rigid Compliance with ventilator (intubated pts.): N/A Vocalization (extubated pts.): Talking in normal tone or no sound CPOT Total: 3 Pain Location: back, buttocks Pain Descriptors / Indicators: Discomfort;Grimacing Pain Intervention(s): Repositioned;Other (comment) (RN notified)  Home Living                                          Prior Functioning/Environment              Frequency  Min 2X/week        Progress Toward Goals  OT Goals(current goals can now be found in the care plan section)  Progress towards OT goals: Progressing toward goals  Acute Rehab OT Goals OT Goal Formulation: Patient unable to participate in goal setting Time For Goal Achievement: 08/26/21 Potential to Achieve Goals: Fair ADL Goals Pt Will Perform Grooming: with min assist;sitting;bed level Pt Will Perform Upper Body Bathing: with mod assist;bed level;sitting Pt Will Transfer to Toilet: with mod assist;squat pivot transfer;bedside commode Pt Will Perform Toileting - Clothing Manipulation and hygiene: with max assist;sitting/lateral leans;sit to/from stand Additional ADL Goal #1: Pt will actively participate in 15 mins therapeutic activity with no more than 2 rest breaks. Additional ADL Goal #2: Pt will consistently follow 1 step commands with 75% accuracy.  Plan Discharge plan remains appropriate;Frequency remains appropriate    Co-evaluation                 AM-PAC OT "6 Clicks" Daily Activity     Outcome Measure   Help from another person eating meals?: Total Help from another person taking care of personal grooming?: A Lot Help from another person toileting, which includes using toliet, bedpan, or urinal?: Total Help from another person bathing (including washing, rinsing, drying)?: A Lot Help from another person to put on and taking off regular upper body clothing?: Total Help from another person to put on and taking off  regular lower body clothing?: Total 6 Click Score: 8    End of Session Equipment Utilized During Treatment: Oxygen  OT Visit Diagnosis: Unsteadiness on feet (R26.81);Cognitive communication deficit (R41.841)   Activity Tolerance Patient limited by fatigue   Patient Left in bed;with call bell/phone within reach;with bed alarm set;with nursing/sitter in room   Nurse Communication Mobility status;Other (comment) (pain)        Time: 9892-1194 OT Time Calculation (min): 12 min  Charges: OT General Charges $OT Visit: 1 Visit OT Treatments $Self Care/Home Management : 8-22 mins  Jolaine Artist, OT [place] Pager [phone] Office [phone]   Delight Stare 08/12/2021, 2:56 PM

## 2021-08-12 NOTE — Progress Notes (Addendum)
18 Days Post-Op  Subjective: Still confused this morning. Otherwise stable.  Moving his bowels per RN.  TFs held overnight due to issues with Cortrak and BiPAP sealing.  Objective: Vital signs in last 24 hours: Temp:  [98.2 F (36.8 C)-99.7 F (37.6 C)] 98.2 F (36.8 C) (12/06 0704) Pulse Rate:  [75-103] 78 (12/06 0730) Resp:  [19-33] 30 (12/06 0730) BP: (117-161)/(47-66) 136/54 (12/06 0700) SpO2:  [90 %-100 %] 92 % (12/06 0758) FiO2 (%):  [30 %-60 %] 60 % (12/06 0758) Weight:  [89.2 kg] 89.2 kg (12/06 0533) Last BM Date: 08/11/21  Intake/Output from previous day: 12/05 0701 - 12/06 0700 In: 2911.6 [I.V.:2013.9; NG/GT:592.5; IV Piggyback:300.2] Out: 2170 [Urine:2150; Drains:20] Intake/Output this shift: No intake/output data recorded.  PE: Gen:  Awake and alert, but confused Abd: Soft, minimally tender. RUQ gravity bag with no output.  RUQ bulb with slightly bloody/cloudy fluid (10cc). LUQ drain with serous fluid in bulb (10cc). Midline incision with clean dry dressing in place.   Lab Results:  Recent Labs    08/12/21 0414  WBC 8.7  HGB 9.3*  HCT 29.9*  PLT 214   BMET Recent Labs    08/10/21 0401 08/11/21 0358  NA 134* 134*  K 3.9 3.9  CL 98 99  CO2 30 31  GLUCOSE 104* 122*  BUN 20 18  CREATININE 0.47* 0.54*  CALCIUM 7.8* 7.9*   PT/INR No results for input(s): LABPROT, INR in the last 72 hours.  CMP     Component Value Date/Time   NA 134 (L) 08/11/2021 0358   NA 142 01/15/2016 1031   K 3.9 08/11/2021 0358   CL 99 08/11/2021 0358   CO2 31 08/11/2021 0358   GLUCOSE 122 (H) 08/11/2021 0358   BUN 18 08/11/2021 0358   BUN 10 01/15/2016 1031   CREATININE 0.54 (L) 08/11/2021 0358   CREATININE 0.74 04/18/2021 1353   CALCIUM 7.9 (L) 08/11/2021 0358   PROT 5.3 (L) 08/11/2021 0358   PROT 6.2 01/15/2016 1031   ALBUMIN <1.5 (L) 08/11/2021 0358   ALBUMIN 4.1 01/15/2016 1031   AST 25 08/11/2021 0358   ALT 17 08/11/2021 0358   ALKPHOS 111 08/11/2021  0358   BILITOT 0.4 08/11/2021 0358   BILITOT 0.3 01/15/2016 1031   GFRNONAA >60 08/11/2021 0358   GFRNONAA 91 02/04/2021 0959   GFRAA 106 02/04/2021 0959   Lipase     Component Value Date/Time   LIPASE 36 07/27/2021 0420    Studies/Results: DG Abd Portable 1V  Result Date: 08/11/2021 CLINICAL DATA:  Feeding tube placement EXAM: PORTABLE ABDOMEN - 1 VIEW COMPARISON:  08/11/2021 at 0823 hours FINDINGS: Limited radiograph of the lower chest and upper abdomen was obtained for the purposes of enteric tube localization. Enteric tube is seen coursing below the diaphragm with distal tip now terminating within the expected location of the duodenal jejunal junction. Percutaneous drainage catheter is again seen overlying the left hepatic lobe. IMPRESSION: Enteric tube tip now terminating at the expected location of the duodenojejunal junction. Electronically Signed   By: Davina Poke D.O.   On: 08/11/2021 12:36   DG Abd Portable 1V  Result Date: 08/11/2021 CLINICAL DATA:  Feeding tube insertion.  Check position. EXAM: PORTABLE ABDOMEN - 1 VIEW COMPARISON:  08/08/2021 FINDINGS: The feeding tube tip is right of the midline in the expected location of the descending duodenum. Percutaneous drainage catheter within the left hepatic lobe is identified just below the left hemidiaphragm. No dilated bowel loops  identified. IMPRESSION: Feeding tube tip is in the descending duodenum. Electronically Signed   By: Kerby Moors M.D.   On: 08/11/2021 08:50    Anti-infectives: Anti-infectives (From admission, onward)    Start     Dose/Rate Route Frequency Ordered Stop   08/09/21 1600  Ampicillin-Sulbactam (UNASYN) 3 g in sodium chloride 0.9 % 100 mL IVPB        3 g 200 mL/hr over 30 Minutes Intravenous Every 8 hours 08/09/21 1441     07/29/21 1800  metroNIDAZOLE (FLAGYL) IVPB 500 mg  Status:  Discontinued        500 mg 100 mL/hr over 60 Minutes Intravenous Every 12 hours 07/29/21 0930 08/09/21 1422    07/29/21 1400  cefTRIAXone (ROCEPHIN) 2 g in sodium chloride 0.9 % 100 mL IVPB  Status:  Discontinued        2 g 200 mL/hr over 30 Minutes Intravenous Every 24 hours 07/29/21 0945 08/09/21 1422   07/26/21 1400  ceFEPIme (MAXIPIME) 2 g in sodium chloride 0.9 % 100 mL IVPB  Status:  Discontinued        2 g 200 mL/hr over 30 Minutes Intravenous Every 8 hours 07/26/21 0721 07/29/21 0945   07/26/21 0800  ceFEPIme (MAXIPIME) 2 g in sodium chloride 0.9 % 100 mL IVPB  Status:  Discontinued        2 g 200 mL/hr over 30 Minutes Intravenous Every 12 hours 07/25/21 1853 07/26/21 0213   07/26/21 0600  metroNIDAZOLE (FLAGYL) IVPB 500 mg  Status:  Discontinued        500 mg 100 mL/hr over 60 Minutes Intravenous Every 8 hours 07/26/21 0119 07/29/21 0930   07/26/21 0600  ceFEPIme (MAXIPIME) 2 g in sodium chloride 0.9 % 100 mL IVPB  Status:  Discontinued        2 g 200 mL/hr over 30 Minutes Intravenous Every 12 hours 07/26/21 0225 07/26/21 0721   07/25/21 1900  ceFEPIme (MAXIPIME) 2 g in sodium chloride 0.9 % 100 mL IVPB        2 g 200 mL/hr over 30 Minutes Intravenous  Once 07/25/21 1845 07/25/21 2015   07/25/21 1900  metroNIDAZOLE (FLAGYL) IVPB 500 mg        500 mg 100 mL/hr over 60 Minutes Intravenous  Once 07/25/21 1845 07/25/21 2138        Assessment/Plan POD 17, s/p Exploratory laparotomy with drainage of pyogenic liver abscess x 2, Partial omentectomy for Ruptured pyogenic liver abscesses by Dr. Dema Severin - 07/25/2021 - Cont IV abx per ID - Drains in place, almost no output from IR drain to gravity and L-sided drain.  RUQ bulb with minimal bloody/cloudy drainage - tolerated TFs at 30cc/hr yesterday.  Stopped overnight due to BiPAP usage.  Restart today.  Wean TNA to off today.  Will d/w RD about increasing goal rate since he isn't getting his TFs at night. - Cont WTD BID to midline - Therapies, mobilize - Pulm toilet -suspect we will plan for repeat CT abd/pel soon.  It has been 5 days since IR  drain placed.  FEN - TFs, wean TNA to off today VTE - heparin gtt ID - unasyn     HTN  Dementia/delirium/agitation - on Seroquel Acute respiratory failure - on HFNC COPD - On scheduled duonebs.  GERD DM2 Prior hx IVDU (1970's)   LOS: 65 days    Henreitta Cea , MD [facility] Surgery 08/12/2021, 7:58 AM Please see Amion for pager number during day  hours 7:00am-4:30pm

## 2021-08-12 NOTE — Plan of Care (Signed)
  Problem: Education: Goal: Knowledge of General Education information will improve Description: Including pain rating scale, medication(s)/side effects and non-pharmacologic comfort measures Outcome: Progressing   Problem: Elimination: Goal: Will not experience complications related to bowel motility Outcome: Progressing Goal: Will not experience complications related to urinary retention Outcome: Progressing   Problem: Pain Managment: Goal: General experience of comfort will improve Outcome: Progressing   Problem: Health Behavior/Discharge Planning: Goal: Ability to manage health-related needs will improve Outcome: Not Progressing   Problem: Clinical Measurements: Goal: Ability to maintain clinical measurements within normal limits will improve Outcome: Not Progressing Goal: Will remain free from infection Outcome: Not Progressing Goal: Diagnostic test results will improve Outcome: Not Progressing Goal: Respiratory complications will improve Outcome: Not Progressing Goal: Cardiovascular complication will be avoided Outcome: Not Progressing   Problem: Activity: Goal: Risk for activity intolerance will decrease Outcome: Not Progressing   Problem: Nutrition: Goal: Adequate nutrition will be maintained Outcome: Not Progressing   Problem: Coping: Goal: Level of anxiety will decrease Outcome: Not Progressing   Problem: Safety: Goal: Ability to remain free from injury will improve Outcome: Not Progressing   Problem: Skin Integrity: Goal: Risk for impaired skin integrity will decrease Outcome: Not Progressing   Problem: Activity: Goal: Ability to tolerate increased activity will improve Outcome: Not Progressing   Problem: Respiratory: Goal: Ability to maintain a clear airway and adequate ventilation will improve Outcome: Not Progressing   Problem: Role Relationship: Goal: Method of communication will improve Outcome: Not Progressing   Problem: Safety: Goal:  Non-violent Restraint(s) Outcome: Not Progressing

## 2021-08-13 ENCOUNTER — Other Ambulatory Visit (HOSPITAL_COMMUNITY): Payer: Self-pay

## 2021-08-13 ENCOUNTER — Other Ambulatory Visit: Payer: Medicare HMO

## 2021-08-13 LAB — PHOSPHORUS: Phosphorus: 3.7 mg/dL (ref 2.5–4.6)

## 2021-08-13 LAB — CBC
HCT: 30.1 % — ABNORMAL LOW (ref 39.0–52.0)
Hemoglobin: 9.4 g/dL — ABNORMAL LOW (ref 13.0–17.0)
MCH: 30.8 pg (ref 26.0–34.0)
MCHC: 31.2 g/dL (ref 30.0–36.0)
MCV: 98.7 fL (ref 80.0–100.0)
Platelets: 210 10*3/uL (ref 150–400)
RBC: 3.05 MIL/uL — ABNORMAL LOW (ref 4.22–5.81)
RDW: 16.2 % — ABNORMAL HIGH (ref 11.5–15.5)
WBC: 8.2 10*3/uL (ref 4.0–10.5)
nRBC: 0 % (ref 0.0–0.2)

## 2021-08-13 LAB — BASIC METABOLIC PANEL
Anion gap: 7 (ref 5–15)
BUN: 17 mg/dL (ref 8–23)
CO2: 32 mmol/L (ref 22–32)
Calcium: 7.9 mg/dL — ABNORMAL LOW (ref 8.9–10.3)
Chloride: 97 mmol/L — ABNORMAL LOW (ref 98–111)
Creatinine, Ser: 0.49 mg/dL — ABNORMAL LOW (ref 0.61–1.24)
GFR, Estimated: >60 mL/min (ref >60)
Glucose, Bld: 198 mg/dL — ABNORMAL HIGH (ref 70–99)
Potassium: 3.8 mmol/L (ref 3.5–5.1)
Sodium: 136 mmol/L (ref 135–145)

## 2021-08-13 LAB — GLUCOSE, CAPILLARY
Glucose-Capillary: 172 mg/dL — ABNORMAL HIGH (ref 70–99)
Glucose-Capillary: 219 mg/dL — ABNORMAL HIGH (ref 70–99)
Glucose-Capillary: 179 mg/dL — ABNORMAL HIGH (ref 70–99)
Glucose-Capillary: 214 mg/dL — ABNORMAL HIGH (ref 70–99)
Glucose-Capillary: 198 mg/dL — ABNORMAL HIGH (ref 70–99)

## 2021-08-13 LAB — HEPARIN LEVEL (UNFRACTIONATED): Heparin Unfractionated: 0.44 IU/mL (ref 0.30–0.70)

## 2021-08-13 LAB — MAGNESIUM: Magnesium: 1.6 mg/dL — ABNORMAL LOW (ref 1.7–2.4)

## 2021-08-13 MED ORDER — AMOXICILLIN-POT CLAVULANATE 400-57 MG/5ML PO SUSR
875.0000 mg | Freq: Two times a day (BID) | ORAL | Status: DC
  Administered 2021-08-14 (×2): 875 mg
  Filled 2021-08-13 (×5): qty 10.9

## 2021-08-13 NOTE — Progress Notes (Signed)
Pt put on BIPAP by RT. Restraints are dced/unhooked from bed. Ativan given to help try and relax

## 2021-08-13 NOTE — Progress Notes (Addendum)
Pt is agitated and pulling at ALL lines in frustration. Ive stopped him on multiple occasions throughout the night. He has mittens on but is still able to grasp the lines. I tried to give Ativan @0032 . It worked for a while. Didn't want to give higher dosage due to pts Resp history. Reached out for wrist restraint order with PCCM on call. Spoke w/call center. Waiting on call back..    On call MD has order in for wrist restraints. Pt is on 5L nasal cannula. Will reassess.

## 2021-08-13 NOTE — Progress Notes (Signed)
Speech Language Pathology Treatment: Dysphagia  Patient Details Name: Matthew Dominguez MRN: [id number] DOB: 07-29-1951 Today's Date: 08/13/2021 Time: 1455-1510 SLP Time Calculation (min) (ACUTE ONLY): 15 min  Assessment / Plan / Recommendation Clinical Impression  Pt quite confused; RN present to assist with repositioning to upright as much as possible. Appearance of thrush has improved since last seen by SLP.  Pt accepted several ice chips today, demonstrating some oral holding, occasional cues needed to chew and swallow. He demonstrated some intermittent, congested coughing after trials. He will need a repeat FEES prior to resuming any oral diet, particularly given multiple incidents of silent aspiration on last exam. D/W RN - will plan for FEES Thurs or Fri of this week.    HPI HPI: Pt is a 70 y.o. male who was brought to the ED after being found down at home by EMS. CT head negative. Pt s/p OR for exploratory laparotomy and drainage of pyogenic liver abscesses (2) and partial omentectomy 08/03/23. Admitted to the ICU postoperatively 2023/08/03 with sepsis and hepatitis secondary to ruptured pyogenic liver abscesses. [facility] passed on 08-03-23 & 2023-08-05. ETT 08-02-2010/19. Pt was initally seen by SLP 11/21 and recommended to be NPO except ice chips due to mentation and s/s of possible aspiration. Therapy was then put on hold due to concern for ileus. PMH: HTN, HLD, DM, COPD, hypothyroidism, mild cognitive impairment.      SLP Plan  Continue with current plan of care      Recommendations for follow up therapy are one component of a multi-disciplinary discharge planning process, led by the attending physician.  Recommendations may be updated based on patient status, additional functional criteria and insurance authorization.    Recommendations  Diet recommendations: NPO                Oral Care Recommendations: Oral care QID Follow Up Recommendations: Skilled nursing-short term rehab (<3  hours/day) Assistance recommended at discharge: Frequent or constant Supervision/Assistance SLP Visit Diagnosis: Dysphagia, oropharyngeal phase (R13.12) Plan: Continue with current plan of care       [place]. Tivis Ringer, [place] CCC/SLP Acute Rehabilitation Services Office number [phone] Pager [phone]  [name]  08/13/2021, 3:23 PM

## 2021-08-13 NOTE — Progress Notes (Signed)
PROGRESS NOTE    Matthew Dominguez  QPR:[id number] DOB: [birth year] DOA: 07/25/2021 PCP: Sandrea Hughs, NP  Brief Narrative: 70/M with history of COPD, asthma, type 2 diabetes mellitus, BPH, hypothyroidism was admitted on 11/18 with sepsis, pyogenic liver abscess, underwent ex lap with partial omentectomy for ruptured pyogenic liver abscess, MRCP 11/23 noted numerous hepatic abscesses with left portal vein thrombosis extending to portal venous bifurcation, possible ascites, cholelithiasis, generalized bowel edema. -RUQ [place] 11/28 noted multiple complex liver lesions measuring <3 cm consistent with multiple liver abscesses, ascites. -Followed by general surgery ID, infectious disease -On 11/29 he was noted to have worsening respiratory distress, subsequently transferred to ICU, treated with BiPAP, did not require intubation -Also treated with heparin drip for portal vein thrombosis -On TPN -Transferred from ICU to [facility] service today 12/7   Assessment & Plan:   Pyogenic liver abscesses Pneumoperitoneum -Etiology remains unclear -s/p ex lap, drainage of pyogenic abscesses, partial omentectomy Dr. Dema Severin 11/18-2 drains placed -Follow-up CT 11/28 noted improvement with persistent hepatic abscesses, s/p hepatic abscess drain in IR 12/1 -Infectious disease following-remains on IV Unasyn, abscess aspirate culture positive for Enterococcus faecalis, ID recommended transition to Augmentin when able to tolerate p.o., anticipate need for at least 4 weeks of antibiotics until 12/15 -IR and general surgery following, off TPN, currently tolerating tube feeds -Plan for colonoscopy at some point when clinically stable to rule out malignancy seeding -[facility]S removed surgical drains 12/7, IR drain still in place -TOC following for LTAC  Left portal vein thrombosis -Secondary to above infections -Antibiotics as above, currently on IV heparin, ?  Unclear if he requires few months of anticoagulation, will discuss with  gastroenterology  Acute hypoxic respiratory failure -Secondary to fluid overload, underlying COPD -Continue IV Lasix today, monitor I's/O, daily weights -Continue BiPAP nightly -Pulmonary toilet, Pulmicort and duo nebs twice daily -Out of bed to chair as tolerated, delirium complicating current issues  Agitation, ICU delirium -Currently on Seroquel  COPD, asthma -Stable, nebs as above  Type 2 diabetes mellitus -CBG stable, sliding scale insulin  DVT prophylaxis: Heparin GTT Code Status: Full code Family Communication: No family at bedside, will update family Disposition Plan: Plan for LTAC noted Status is: Inpatient  Remains inpatient appropriate because: Severity of illness   Consultants:  General surgery, IR, PCCM transfer  Procedures:   Antimicrobials:    Subjective: -Transferred from ICU to floor yesterday, agitation requiring restraints  Objective: Vitals:   08/13/21 0659 08/13/21 0700 08/13/21 0852 08/13/21 1004  BP:    127/62  Pulse: 78 86    Resp: (!) 26 19    Temp:      TempSrc:      SpO2: 93% 96% 98%   Weight:      Height:        Intake/Output Summary (Last 24 hours) at 08/13/2021 1032 Last data filed at 08/13/2021 0500 Gross per 24 hour  Intake 759.07 ml  Output 2580 ml  Net -1820.93 ml   Filed Weights   08/09/21 2000 08/11/21 0432 08/12/21 0533  Weight: 90.4 kg 88.6 kg 89.2 kg    Examination:  General exam: Elderly male sitting up in bed, awake alert oriented to self and place, partly to time, cognitive deficits noted HEENT: cortrak with tube feeds infusing CVS: S1-S2, regular rate rhythm Lungs: Poor air movement bilaterally Abdomen: Firm, mildly distended, mild right-sided tenderness, drain noted Extremities: 1+ edema Skin: No rash on exposed skin Psych: Poor insight and judgment   Data Reviewed:  CBC: Recent Labs  Lab 08/07/21 0429 08/08/21 0454 08/09/21 0318 08/12/21 0414 08/13/21 0517  WBC 10.2 8.4 8.6 8.7 8.2  HGB  9.3* 8.9* 9.0* 9.3* 9.4*  HCT 29.1* 28.1* 28.0* 29.9* 30.1*  MCV 97.0 98.3 96.9 99.0 98.7  PLT 242 211 206 214 144   Basic Metabolic Panel: Recent Labs  Lab 08/09/21 0318 08/09/21 2000 08/10/21 0401 08/11/21 0358 08/12/21 0414 08/13/21 0517  NA 135 134* 134* 134*  --  136  K 4.0 3.8 3.9 3.9  --  3.8  CL 99 97* 98 99  --  97*  CO2 32 30 30 31   --  32  GLUCOSE 92 144* 104* 122*  --  198*  BUN 27* 22 20 18   --  17  CREATININE 0.54* 0.56* 0.47* 0.54*  --  0.49*  CALCIUM 7.8* 8.0* 7.8* 7.9*  --  7.9*  MG 1.9  --  1.8 1.7 1.8 1.6*  PHOS 3.6  --  4.3 4.0 4.1 3.7   GFR: Estimated Creatinine Clearance: 88.9 mL/min (A) (by C-G formula based on SCr of 0.49 mg/dL (L)). Liver Function Tests: Recent Labs  Lab 08/07/21 0429 08/11/21 0358  AST 25 25  ALT 16 17  ALKPHOS 127* 111  BILITOT 0.2* 0.4  PROT 5.1* 5.3*  ALBUMIN <1.5* <1.5*   No results for input(s): LIPASE, AMYLASE in the last 168 hours. No results for input(s): AMMONIA in the last 168 hours. Coagulation Profile: Recent Labs  Lab 08/07/21 0430  INR 1.2   Cardiac Enzymes: No results for input(s): CKTOTAL, CKMB, CKMBINDEX, TROPONINI in the last 168 hours. BNP (last 3 results) No results for input(s): PROBNP in the last 8760 hours. HbA1C: No results for input(s): HGBA1C in the last 72 hours. CBG: Recent Labs  Lab 08/12/21 1142 08/12/21 1557 08/12/21 2212 08/13/21 0420 08/13/21 0800  GLUCAP 177* 214* 224* 172* 219*   Lipid Profile: Recent Labs    08/11/21 0358  TRIG 76   Thyroid Function Tests: No results for input(s): TSH, T4TOTAL, FREET4, T3FREE, THYROIDAB in the last 72 hours. Anemia Panel: No results for input(s): VITAMINB12, FOLATE, FERRITIN, TIBC, IRON, RETICCTPCT in the last 72 hours. Urine analysis:    Component Value Date/Time   COLORURINE AMBER (A) 08/04/2021 1820   APPEARANCEUR HAZY (A) 08/04/2021 1820   LABSPEC 1.024 08/04/2021 1820   PHURINE 5.0 08/04/2021 1820   GLUCOSEU 50 (A)  08/04/2021 1820   HGBUR MODERATE (A) 08/04/2021 1820   BILIRUBINUR NEGATIVE 08/04/2021 1820   KETONESUR NEGATIVE 08/04/2021 1820   PROTEINUR NEGATIVE 08/04/2021 1820   NITRITE NEGATIVE 08/04/2021 1820   LEUKOCYTESUR NEGATIVE 08/04/2021 1820   Sepsis Labs: @LABRCNTIP (procalcitonin:4,lacticidven:4)  ) Recent Results (from the past 240 hour(s))  Culture, blood (routine x 2)     Status: None   Collection Time: 08/04/21 12:28 PM   Specimen: BLOOD LEFT FOREARM  Result Value Ref Range Status   Specimen Description BLOOD LEFT FOREARM  Final   Special Requests   Final    BOTTLES DRAWN AEROBIC AND ANAEROBIC Blood Culture adequate volume   Culture   Final    NO GROWTH 5 DAYS Performed at [facility], [place] [address], [place], [place] [address]    Report Status 08/09/2021 FINAL  Final  Culture, blood (routine x 2)     Status: None   Collection Time: 08/04/21 12:38 PM   Specimen: BLOOD LEFT WRIST  Result Value Ref Range Status   Specimen Description BLOOD LEFT WRIST  Final   Special Requests   Final    BOTTLES DRAWN AEROBIC AND ANAEROBIC Blood Culture adequate volume   Culture   Final    NO GROWTH 5 DAYS Performed at [facility], [address] [place], [place] [address]    Report Status 08/09/2021 FINAL  Final  Urine Culture     Status: None   Collection Time: 08/04/21  3:03 PM   Specimen: Urine, Clean Catch  Result Value Ref Range Status   Specimen Description URINE, CLEAN CATCH  Final   Special Requests NONE  Final   Culture   Final    NO GROWTH Performed at [facility], [place] [address], [place], [place] [address]    Report Status 08/05/2021 FINAL  Final  Aerobic/Anaerobic Culture w Gram Stain (surgical/deep wound)     Status: None   Collection Time: 08/07/21  2:55 PM   Specimen: Wound  Result Value Ref Range Status   Specimen Description WOUND  Final   Special Requests  ABCESS  Final   Gram Stain   Final    NO SQUAMOUS EPITHELIAL CELLS SEEN ABUNDANT  WBC SEEN NO ORGANISMS SEEN    Culture   Final    FEW ENTEROCOCCUS FAECALIS NO ANAEROBES ISOLATED Performed at [facility], [place] [address], [place], [place] [address]    Report Status 08/12/2021 FINAL  Final   Organism ID, Bacteria ENTEROCOCCUS FAECALIS  Final      Susceptibility   Enterococcus faecalis - MIC*    AMPICILLIN <=2 SENSITIVE Sensitive     VANCOMYCIN <=0.5 SENSITIVE Sensitive     GENTAMICIN SYNERGY SENSITIVE Sensitive     * FEW ENTEROCOCCUS FAECALIS         Radiology Studies: CT ABDOMEN PELVIS W CONTRAST  Result Date: 08/12/2021 CLINICAL DATA:  Hepatic abscess/intra-abdominal abscess follow-up. Status post drain placement. EXAM: CT ABDOMEN AND PELVIS WITH CONTRAST TECHNIQUE: Multidetector CT imaging of the abdomen and pelvis was performed using the standard protocol following bolus administration of intravenous contrast. CONTRAST:  14mL OMNIPAQUE IOHEXOL 300 MG/ML  SOLN COMPARISON:  CT abdomen and pelvis 08/06/2021. FINDINGS: Lower chest: Small bilateral pleural effusions are again seen. There is atelectasis in the bilateral lower lobes, increasing on the right. Hepatobiliary: Air-fluid collection compatible with hepatic abscess in the left lobe of the liver has mildly decreased in size now measuring 3.8 x 2.4 x 2.8 cm. There is a new percutaneous drainage catheter with distal tip along the anterior margin of the abscess. Left portal vein thrombosis is unchanged. No new liver lesions are seen. Subcentimeter hypodense lesions in the superior liver image 3/11 are unchanged. Gallstones are again noted. There is diffuse gallbladder wall edema which has increased. There is no biliary ductal dilatation. Pancreas: Unremarkable. No pancreatic ductal dilatation or surrounding inflammatory changes. Spleen: Normal in size without focal abnormality. Adrenals/Urinary Tract: Adrenal glands are unremarkable. Kidneys are normal, without renal calculi, focal lesion, or hydronephrosis. The  bladder is distended and contains a small amount of air. Stomach/Bowel: Gastrojejunostomy tube is in place, unchanged. There is likely diffuse gastric wall thickening involving the antrum and distal body of the stomach with surrounding inflammation. Otherwise, small bowel and colon are within normal limits. The appendix is within normal limits. Vascular/Lymphatic: Aortic atherosclerosis. No enlarged abdominal or pelvic lymph nodes. Reproductive: Prostate gland is enlarged, unchanged. Other: There is small volume ascites similar to the prior examination. There is a small amount of free air in the anterior right abdomen  image 3/42 which is not seen on the prior examination. Percutaneous catheter extending over the left lobe of the liver is unchanged in position. Musculoskeletal: Diffuse body wall edema is unchanged. No acute fractures. IMPRESSION: 1. Left hepatic abscess has decreased in size now measuring 3.8 x 2.4 by 2.8 cm. Percutaneous drainage catheter in place in the anterior collection. 2. Cholelithiasis with increasing gallbladder wall edema. Correlate clinically for cholecystitis. 3. Gastrojejunostomy tube in place. There is wall thickening and inflammation surrounding the gastric body and antrum concerning for gastritis. 4. Small amount of free air in the right abdomen is new from prior examination, indeterminate. 5. Stable small volume ascites. 6. Distended bladder containing a small amount of air. Correlate for infection. 7. Stable pleural effusions and body wall edema. 8. Stable left portal vein thrombosis. Electronically Signed   By: Ronney Asters M.D.   On: 08/12/2021 18:09   DG Abd Portable 1V  Result Date: 08/11/2021 CLINICAL DATA:  Feeding tube placement EXAM: PORTABLE ABDOMEN - 1 VIEW COMPARISON:  08/11/2021 at 0823 hours FINDINGS: Limited radiograph of the lower chest and upper abdomen was obtained for the purposes of enteric tube localization. Enteric tube is seen coursing below the diaphragm  with distal tip now terminating within the expected location of the duodenal jejunal junction. Percutaneous drainage catheter is again seen overlying the left hepatic lobe. IMPRESSION: Enteric tube tip now terminating at the expected location of the duodenojejunal junction. Electronically Signed   By: Davina Poke D.O.   On: 08/11/2021 12:36        Scheduled Meds:  amLODipine  10 mg Oral Daily   amoxicillin-clavulanate  875 mg Per Tube Q12H   budesonide (PULMICORT) nebulizer solution  0.5 mg Nebulization BID   chlorhexidine  15 mL Mouth Rinse BID   Chlorhexidine Gluconate Cloth  6 each Topical Daily   collagenase   Topical Daily   docusate sodium  100 mg Oral BID   doxazosin  1 mg Oral Daily   feeding supplement (PROSource TF)  45 mL Per Tube QID   furosemide  80 mg Intravenous BID   guaiFENesin  15 mL Per Tube Q4H   insulin aspart  0-15 Units Subcutaneous Q4H   ipratropium-albuterol  3 mL Nebulization BID   mouth rinse  15 mL Mouth Rinse q12n4p   metoprolol tartrate  10 mg Intravenous Q6H   QUEtiapine  100 mg Oral BID   sodium chloride flush  10-40 mL Intracatheter Q12H   sodium chloride flush  10-40 mL Intracatheter Q12H   sodium chloride flush  5 mL Intracatheter Q8H   Continuous Infusions:  sodium chloride Stopped (08/06/21 1126)   feeding supplement (OSMOLITE 1.5 CAL) 1,000 mL (08/13/21 0956)   heparin 2,450 Units/hr (08/12/21 1600)     LOS: 18 days    Time spent: 77min    Domenic Polite, MD [facility]   08/13/2021, 10:32 AM

## 2021-08-13 NOTE — Progress Notes (Signed)
Pt placed on BIPAP due to increased resp rate and labored breathing.  Mittens and restraints removed per protocol.

## 2021-08-13 NOTE — TOC Benefit Eligibility Note (Signed)
Patient Teacher, English as a foreign language completed.    The patient is currently admitted and upon discharge could be taking Xarelto 20 mg.  The current 30 day co-pay is, $27.65.   The patient is currently admitted and upon discharge could be taking Eliquis 5 mg.  The current 30 day co-pay is, $28.32.   The patient is currently admitted and upon discharge could be taking Warfarin 5 mg.  The current 30 day co-pay is, $3.95.   The patient is insured through [employer], [place] Patient Advocate Specialist [place] Patient Advocate Team Direct Number: [phone]  Fax: [phone]

## 2021-08-13 NOTE — Progress Notes (Signed)
PRN neb given to patient due to sob and exp wheezing.

## 2021-08-13 NOTE — Progress Notes (Signed)
Pt refused dressing change. RN tried again while pt on BIPAP. Pt swinging arms around. Will pass on to next shift. Pt fell back to asleep.

## 2021-08-13 NOTE — Progress Notes (Signed)
[place] Progress Note Patient Name: Matthew Dominguez DOB: Feb 25, 1951 MRN: [id number]   Date of Service  08/13/2021  HPI/Events of Note  Agitation - Patient pulling at lines and drains all night. Nursing request for bilateral soft wrist restraints. Patient is not on BiPAP per Nursing.  eICU Interventions  Plan: Bilateral soft wrist restraints X 5 hours.      Intervention Category Major Interventions: Delirium, psychosis, severe agitation - evaluation and management  Drae Mitzel Eugene 08/13/2021, 4:14 AM

## 2021-08-13 NOTE — Discharge Instructions (Signed)
[facility]      [facility] [phone]  OPEN ABDOMINAL SURGERY: POST OP INSTRUCTIONS  Always review your discharge instruction sheet given to you by the facility where your surgery was performed.  IF YOU HAVE DISABILITY OR FAMILY LEAVE FORMS, YOU MUST BRING THEM TO THE OFFICE FOR PROCESSING.  PLEASE DO NOT GIVE THEM TO YOUR DOCTOR.  A prescription for pain medication may be given to you upon discharge.  Take your pain medication as prescribed, if needed.  If narcotic pain medicine is not needed, then you may take acetaminophen (Tylenol) or ibuprofen (Advil) as needed. Take your usually prescribed medications unless otherwise directed. If you need a refill on your pain medication, please contact your pharmacy. They will contact our office to request authorization.  Prescriptions will not be filled after 5pm or on week-ends. You should follow a light diet the first few days after arrival home, such as soup and crackers, pudding, etc.unless your doctor has advised otherwise. A high-fiber, low fat diet can be resumed as tolerated.   Be sure to include lots of fluids daily. Most patients will experience some swelling and bruising on the chest and neck area.  Ice packs will help.  Swelling and bruising can take several days to resolve Most patients will experience some swelling and bruising in the area of the incision. Ice pack will help. Swelling and bruising can take several days to resolve..  It is common to experience some constipation if taking pain medication after surgery.  Increasing fluid intake and taking a stool softener will usually help or prevent this problem from occurring.  A mild laxative (Milk of Magnesia or Miralax) should be taken according to package directions if there are no bowel movements after 48 hours.  You may have steri-strips (small skin tapes) in place directly over the incision.  These strips should be left on the skin for 7-10 days.  If your surgeon used skin  glue on the incision, you may shower in 24 hours.  The glue will flake off over the next 2-3 weeks.  Any sutures or staples will be removed at the office during your follow-up visit. You may find that a light gauze bandage over your incision may keep your staples from being rubbed or pulled. You may shower and replace the bandage daily. ACTIVITIES:  You may resume regular (light) daily activities beginning the next day--such as daily self-care, walking, climbing stairs--gradually increasing activities as tolerated.  You may have sexual intercourse when it is comfortable.  Refrain from any heavy lifting or straining until approved by your doctor. You may drive when you no longer are taking prescription pain medication, you can comfortably wear a seatbelt, and you can safely maneuver your car and apply brakes Return to Work: ___________________________________ You should see your doctor in the office for a follow-up appointment approximately two weeks after your surgery.  Make sure that you call for this appointment within a day or two after you arrive home to insure a convenient appointment time. OTHER INSTRUCTIONS:  _____________________________________________________________ _____________________________________________________________  WHEN TO CALL YOUR DOCTOR: Fever over 101.0 Inability to urinate Nausea and/or vomiting Extreme swelling or bruising Continued bleeding from incision. Increased pain, redness, or drainage from the incision. Difficulty swallowing or breathing Muscle cramping or spasms. Numbness or tingling in hands or feet or around lips.  The clinic staff is available to answer your questions during regular business hours.  Please don't hesitate to call and ask to speak to one of   the nurses if you have concerns.  For further questions, please visit [email or web address]  

## 2021-08-13 NOTE — Progress Notes (Signed)
Pt not in restraint. Day shift LPN stated that he didn't see a need for the restraint. Pt isnt trying to get out of the bed. Pt is slightly confused and messes with lines every now and then. He is redirectable in most instances.

## 2021-08-13 NOTE — Progress Notes (Signed)
ANTICOAGULATION CONSULT NOTE  Pharmacy Consult for Heparin Indication: Portal Vein Thrombosis   No Known Allergies  Patient Measurements: Height: 5\' 7"  (170.2 cm) Weight: 89.2 kg (196 lb 10.4 oz) IBW/kg (Calculated) : 66.1 Heparin Dosing Weight: 73 kg   Vital Signs: Temp: 98.2 F (36.8 C) (12/07 1200) Temp Source: Oral (12/07 1200) BP: 104/50 (12/07 1200) Pulse Rate: 104 (12/07 1200)  Labs: Recent Labs    08/11/21 0357 08/11/21 0358 08/12/21 0414 08/13/21 0517  HGB  --   --  9.3* 9.4*  HCT  --   --  29.9* 30.1*  PLT  --   --  214 210  HEPARINUNFRC 0.51  --  0.58 0.44  CREATININE  --  0.54*  --  0.49*     Estimated Creatinine Clearance: 88.9 mL/min (A) (by C-G formula based on SCr of 0.49 mg/dL (L)).   Assessment: 70 yo male presented on 07/25/2021 after being found down with AMS found to have perforated liver abscess s/p partial omentectomy and drain placement. MRI with suspected portal vein thrombosis. Results of MRI dicussed with CCM and surgery.   Heparin level therapeutic at 0.49 on infusion at 2450 units/hr. No bleeding or IV issues reported.   Dr. Broadus John noted, ?  Unclear if he requires few months of anticoagulation, MD will discuss with gastroenterology.   Goal of Therapy:  Heparin level 0.3-0.7 units/ml Monitor platelets by anticoagulation protocol: Yes   Plan:  Continue heparin infusion at 2450 units/hr  Daily heparin level and CBC ordered Follow up long term anticoagulation plan  Thank you for allowing pharmacy to be part of this patients care team. Nicole Cella, [place] Pharmacist [phone] Please check AMION for all [place] numbers 08/13/2021 1:44 PM

## 2021-08-13 NOTE — Progress Notes (Signed)
19 Days Post-Op  Subjective: Still confused this morning. Tolerating TFs.  Still moving his bowels.  Objective: Vital signs in last 24 hours: Temp:  [98.4 F (36.9 C)-98.5 F (36.9 C)] 98.4 F (36.9 C) (12/06 1600) Pulse Rate:  [78-111] 86 (12/07 0700) Resp:  [17-31] 19 (12/07 0700) BP: (126-161)/(51-67) 127/62 (12/07 1004) SpO2:  [90 %-99 %] 98 % (12/07 0852) FiO2 (%):  [60 %-100 %] 100 % (12/06 2033) Last BM Date: 08/12/21  Intake/Output from previous day: 12/06 0701 - 12/07 0700 In: 1271.7 [I.V.:637.3; NG/GT:534.3; IV Piggyback:100] Out: 0786 [Urine:3050; Emesis/NG output:180] Intake/Output this shift: No intake/output data recorded.  PE: Gen:  Awake and alert, but confused Abd: Soft, minimally tender. RUQ gravity bag with no output, removed.  RUQ bulb output changed to brown, cloudy. LUQ drain removed as well.  No output present today. Midline incision with clean dry dressing in place.   Lab Results:  Recent Labs    08/12/21 0414 08/13/21 0517  WBC 8.7 8.2  HGB 9.3* 9.4*  HCT 29.9* 30.1*  PLT 214 210   BMET Recent Labs    08/11/21 0358 08/13/21 0517  NA 134* 136  K 3.9 3.8  CL 99 97*  CO2 31 32  GLUCOSE 122* 198*  BUN 18 17  CREATININE 0.54* 0.49*  CALCIUM 7.9* 7.9*   PT/INR No results for input(s): LABPROT, INR in the last 72 hours.  CMP     Component Value Date/Time   NA 136 08/13/2021 0517   NA 142 01/15/2016 1031   K 3.8 08/13/2021 0517   CL 97 (L) 08/13/2021 0517   CO2 32 08/13/2021 0517   GLUCOSE 198 (H) 08/13/2021 0517   BUN 17 08/13/2021 0517   BUN 10 01/15/2016 1031   CREATININE 0.49 (L) 08/13/2021 0517   CREATININE 0.74 04/18/2021 1353   CALCIUM 7.9 (L) 08/13/2021 0517   PROT 5.3 (L) 08/11/2021 0358   PROT 6.2 01/15/2016 1031   ALBUMIN <1.5 (L) 08/11/2021 0358   ALBUMIN 4.1 01/15/2016 1031   AST 25 08/11/2021 0358   ALT 17 08/11/2021 0358   ALKPHOS 111 08/11/2021 0358   BILITOT 0.4 08/11/2021 0358   BILITOT 0.3  01/15/2016 1031   GFRNONAA >60 08/13/2021 0517   GFRNONAA 91 02/04/2021 0959   GFRAA 106 02/04/2021 0959   Lipase     Component Value Date/Time   LIPASE 36 07/27/2021 0420    Studies/Results: CT ABDOMEN PELVIS W CONTRAST  Result Date: 08/12/2021 CLINICAL DATA:  Hepatic abscess/intra-abdominal abscess follow-up. Status post drain placement. EXAM: CT ABDOMEN AND PELVIS WITH CONTRAST TECHNIQUE: Multidetector CT imaging of the abdomen and pelvis was performed using the standard protocol following bolus administration of intravenous contrast. CONTRAST:  11mL OMNIPAQUE IOHEXOL 300 MG/ML  SOLN COMPARISON:  CT abdomen and pelvis 08/06/2021. FINDINGS: Lower chest: Small bilateral pleural effusions are again seen. There is atelectasis in the bilateral lower lobes, increasing on the right. Hepatobiliary: Air-fluid collection compatible with hepatic abscess in the left lobe of the liver has mildly decreased in size now measuring 3.8 x 2.4 x 2.8 cm. There is a new percutaneous drainage catheter with distal tip along the anterior margin of the abscess. Left portal vein thrombosis is unchanged. No new liver lesions are seen. Subcentimeter hypodense lesions in the superior liver image 3/11 are unchanged. Gallstones are again noted. There is diffuse gallbladder wall edema which has increased. There is no biliary ductal dilatation. Pancreas: Unremarkable. No pancreatic ductal dilatation or surrounding inflammatory changes.  Spleen: Normal in size without focal abnormality. Adrenals/Urinary Tract: Adrenal glands are unremarkable. Kidneys are normal, without renal calculi, focal lesion, or hydronephrosis. The bladder is distended and contains a small amount of air. Stomach/Bowel: Gastrojejunostomy tube is in place, unchanged. There is likely diffuse gastric wall thickening involving the antrum and distal body of the stomach with surrounding inflammation. Otherwise, small bowel and colon are within normal limits. The  appendix is within normal limits. Vascular/Lymphatic: Aortic atherosclerosis. No enlarged abdominal or pelvic lymph nodes. Reproductive: Prostate gland is enlarged, unchanged. Other: There is small volume ascites similar to the prior examination. There is a small amount of free air in the anterior right abdomen image 3/42 which is not seen on the prior examination. Percutaneous catheter extending over the left lobe of the liver is unchanged in position. Musculoskeletal: Diffuse body wall edema is unchanged. No acute fractures. IMPRESSION: 1. Left hepatic abscess has decreased in size now measuring 3.8 x 2.4 by 2.8 cm. Percutaneous drainage catheter in place in the anterior collection. 2. Cholelithiasis with increasing gallbladder wall edema. Correlate clinically for cholecystitis. 3. Gastrojejunostomy tube in place. There is wall thickening and inflammation surrounding the gastric body and antrum concerning for gastritis. 4. Small amount of free air in the right abdomen is new from prior examination, indeterminate. 5. Stable small volume ascites. 6. Distended bladder containing a small amount of air. Correlate for infection. 7. Stable pleural effusions and body wall edema. 8. Stable left portal vein thrombosis. Electronically Signed   By: Ronney Asters M.D.   On: 08/12/2021 18:09   DG Abd Portable 1V  Result Date: 08/11/2021 CLINICAL DATA:  Feeding tube placement EXAM: PORTABLE ABDOMEN - 1 VIEW COMPARISON:  08/11/2021 at 0823 hours FINDINGS: Limited radiograph of the lower chest and upper abdomen was obtained for the purposes of enteric tube localization. Enteric tube is seen coursing below the diaphragm with distal tip now terminating within the expected location of the duodenal jejunal junction. Percutaneous drainage catheter is again seen overlying the left hepatic lobe. IMPRESSION: Enteric tube tip now terminating at the expected location of the duodenojejunal junction. Electronically Signed   By: Davina Poke D.O.   On: 08/11/2021 12:36    Anti-infectives: Anti-infectives (From admission, onward)    Start     Dose/Rate Route Frequency Ordered Stop   08/13/21 2200  amoxicillin-clavulanate (AUGMENTIN) 400-57 MG/5ML suspension 875 mg        875 mg Per Tube Every 12 hours 08/13/21 1005 08/21/21 2159   08/09/21 1600  Ampicillin-Sulbactam (UNASYN) 3 g in sodium chloride 0.9 % 100 mL IVPB  Status:  Discontinued        3 g 200 mL/hr over 30 Minutes Intravenous Every 8 hours 08/09/21 1441 08/13/21 1005   07/29/21 1800  metroNIDAZOLE (FLAGYL) IVPB 500 mg  Status:  Discontinued        500 mg 100 mL/hr over 60 Minutes Intravenous Every 12 hours 07/29/21 0930 08/09/21 1422   07/29/21 1400  cefTRIAXone (ROCEPHIN) 2 g in sodium chloride 0.9 % 100 mL IVPB  Status:  Discontinued        2 g 200 mL/hr over 30 Minutes Intravenous Every 24 hours 07/29/21 0945 08/09/21 1422   07/26/21 1400  ceFEPIme (MAXIPIME) 2 g in sodium chloride 0.9 % 100 mL IVPB  Status:  Discontinued        2 g 200 mL/hr over 30 Minutes Intravenous Every 8 hours 07/26/21 0721 07/29/21 0945   07/26/21 0800  ceFEPIme (MAXIPIME)  2 g in sodium chloride 0.9 % 100 mL IVPB  Status:  Discontinued        2 g 200 mL/hr over 30 Minutes Intravenous Every 12 hours 07/25/21 1853 07/26/21 0213   07/26/21 0600  metroNIDAZOLE (FLAGYL) IVPB 500 mg  Status:  Discontinued        500 mg 100 mL/hr over 60 Minutes Intravenous Every 8 hours 07/26/21 0119 07/29/21 0930   07/26/21 0600  ceFEPIme (MAXIPIME) 2 g in sodium chloride 0.9 % 100 mL IVPB  Status:  Discontinued        2 g 200 mL/hr over 30 Minutes Intravenous Every 12 hours 07/26/21 0225 07/26/21 0721   07/25/21 1900  ceFEPIme (MAXIPIME) 2 g in sodium chloride 0.9 % 100 mL IVPB        2 g 200 mL/hr over 30 Minutes Intravenous  Once 07/25/21 1845 07/25/21 2015   07/25/21 1900  metroNIDAZOLE (FLAGYL) IVPB 500 mg        500 mg 100 mL/hr over 60 Minutes Intravenous  Once 07/25/21 1845 07/25/21 2138         Assessment/Plan POD 18, s/p Exploratory laparotomy with drainage of pyogenic liver abscess x 2, Partial omentectomy for Ruptured pyogenic liver abscesses by Dr. Dema Severin - 07/25/2021 - Cont abx per ID - all surgical drains removed today after CT scan shows no fluid around these.  IR drain remains in place as he still has some collection in the liver - tolerated TFs - Cont WTD BID to midline - Therapies, mobilize - Pulm toilet -patient is surgically stable for DC to [facility]. -we will arrange follow up for him with Dr. Dema Severin in about 4 weeks once he is out of LTAC.  Otherwise we will sign off for now.  Let us know if we are needed while awaiting LTAC placement  FEN - TFs, diet per SLP when passes VTE - heparin gtt ID - unasyn     HTN  Dementia/delirium/agitation - on Seroquel Acute respiratory failure - on HFNC COPD - On scheduled duonebs.  GERD DM2 Prior hx IVDU (1970's)   LOS: 69 days    Henreitta Cea , MD [facility] Surgery 08/13/2021, 10:46 AM Please see Amion for pager number during day hours 7:00am-4:30pm

## 2021-08-13 NOTE — Progress Notes (Signed)
[facility] for Infectious Disease  Date of Admission:  07/25/2021   Total days of inpatient antibiotics 4  Principal Problem:   Severe sepsis (HCC) Active Problems:   Diabetes mellitus type 2, controlled ([place])   Essential hypertension, benign   COPD (chronic obstructive pulmonary disease) (HCC)   Mild cognitive impairment   Liver abscess   Malnutrition of moderate degree   Portal vein thrombosis   Hypertensive urgency   Ileus, postoperative (HCC)   Acute metabolic encephalopathy   Acute hypercapnic respiratory failure (HCC)   Fever   Acute urinary retention          Assessment: 70 year old male with diabetes poorly controlled, COPD, mild cognitive impairment admitted for pneumoperitoneum secondary to ruptured liver abscess.  CT showed pneumoperitoneum, cholelithiasis, fatty liver.  On arrival LFTs elevated with a T bili of 3.5.  ID consult for antibiotic recommendations.   #Pneumoperitoneum 2/2 ruptured pyogenic liver abscess as a complication of  pyelophlebitis SP Ex lap on 11/18 # TPN #Uncontrolled DM A1c 12.5 on 12/18/20 #Chronic fatty liver disease, NASH: LFTs consistently elevated since 2017 #Remote Hx of IVDA(last use 1974) -MRCP showed portal vein thrombosis  and multiple liver abscesses, mild intrahepatic biliary ductal distension. GI consulted. Plan on colonoscopy in the future to evaluate for colon malignancy. Pt is on antibiotics+heparin gtt for  pyelophlebitis.  -Pt continued to have leukocytosis/fever despite being on IV antibiotics.  IR consulted and pt underwent CT guided aspiration of liver abscess on 12/1, 32ml purulent fluid aspirate and sent for Cx +Enterococcus faecalis. Antibiotics switched to Unasyn  -Leukocytosis and fever resolved.   Recommendations:  -D/C Unasyn, -Start  Augmentin 875-125mg   to complete 4 weeks of antibiotics(11/18-12/15) - HBV re-immunization on discharge - ID will sign off, appointment with ID scheduled     Microbiology:   Antibiotics: Cefepime 11/18-21 metronidazole 11/18-12/03 Ceftriaxone 11/22-12/03 Unasyn 12/4-p  Cultures: Blood Cx: 11/19 NGTD, 11/29 Urine Cx: 11/29 Aspirtae fluid Cx form 12/1 Enterococcus faecalis      SUBJECTIVE: Pt is resting in bed. No new complaints.   Interval: Afebrile overnight, wbc 10.2K Review of Systems: Review of Systems  All other systems reviewed and are negative.   Scheduled Meds:  amLODipine  10 mg Oral Daily   amoxicillin-clavulanate  875 mg Per Tube Q12H   budesonide (PULMICORT) nebulizer solution  0.5 mg Nebulization BID   chlorhexidine  15 mL Mouth Rinse BID   Chlorhexidine Gluconate Cloth  6 each Topical Daily   collagenase   Topical Daily   docusate sodium  100 mg Oral BID   doxazosin  1 mg Oral Daily   feeding supplement (PROSource TF)  45 mL Per Tube QID   furosemide  80 mg Intravenous BID   guaiFENesin  15 mL Per Tube Q4H   insulin aspart  0-15 Units Subcutaneous Q4H   ipratropium-albuterol  3 mL Nebulization BID   mouth rinse  15 mL Mouth Rinse q12n4p   metoprolol tartrate  10 mg Intravenous Q6H   QUEtiapine  100 mg Oral BID   sodium chloride flush  10-40 mL Intracatheter Q12H   sodium chloride flush  10-40 mL Intracatheter Q12H   sodium chloride flush  5 mL Intracatheter Q8H   Continuous Infusions:  sodium chloride Stopped (08/06/21 1126)   feeding supplement (OSMOLITE 1.5 CAL) 1,000 mL (08/13/21 0956)   heparin 2,450 Units/hr (08/12/21 1600)   PRN Meds:.sodium chloride, acetaminophen, albuterol, fentaNYL (SUBLIMAZE) injection, hydrALAZINE, influenza vaccine adjuvanted, labetalol,  LORazepam, ondansetron (ZOFRAN) IV No Known Allergies  OBJECTIVE: Vitals:   08/13/21 0659 08/13/21 0700 08/13/21 0852 08/13/21 1004  BP:    127/62  Pulse: 78 86    Resp: (!) 26 19    Temp:      TempSrc:      SpO2: 93% 96% 98%   Weight:      Height:       Body mass index is 30.8 kg/m.  Physical Exam Constitutional:       General: He is not in acute distress.    Appearance: He is normal weight. He is not toxic-appearing.  HENT:     Head: Normocephalic and atraumatic.     Right Ear: External ear normal.     Left Ear: External ear normal.     Nose: No congestion or rhinorrhea.     Mouth/Throat:     Mouth: Mucous membranes are moist.     Pharynx: Oropharynx is clear.  Eyes:     Extraocular Movements: Extraocular movements intact.     Conjunctiva/sclera: Conjunctivae normal.     Pupils: Pupils are equal, round, and reactive to light.  Cardiovascular:     Rate and Rhythm: Normal rate and regular rhythm.     Heart sounds: No murmur heard.   No friction rub. No gallop.  Pulmonary:     Effort: Pulmonary effort is normal.     Breath sounds: Normal breath sounds.  Abdominal:     Palpations: Abdomen is soft.     Comments: Distended, tender  Musculoskeletal:        General: No swelling. Normal range of motion.     Cervical back: Normal range of motion and neck supple.  Skin:    General: Skin is warm and dry.  Neurological:     General: No focal deficit present.     Mental Status: He is oriented to person, place, and time.  Psychiatric:        Mood and Affect: Mood normal.      Lab Results Lab Results  Component Value Date   WBC 8.2 08/13/2021   HGB 9.4 (L) 08/13/2021   HCT 30.1 (L) 08/13/2021   MCV 98.7 08/13/2021   PLT 210 08/13/2021    Lab Results  Component Value Date   CREATININE 0.49 (L) 08/13/2021   BUN 17 08/13/2021   NA 136 08/13/2021   K 3.8 08/13/2021   CL 97 (L) 08/13/2021   CO2 32 08/13/2021    Lab Results  Component Value Date   ALT 17 08/11/2021   AST 25 08/11/2021   ALKPHOS 111 08/11/2021   BILITOT 0.4 08/11/2021        Laurice Record, [place] Infect[facility] 08/13/2021, 11:21 AM

## 2021-08-13 NOTE — Progress Notes (Signed)
Referring Physician(s): TWSFK,C,LE  Supervising Physician: Aletta Edouard  Patient Status:  [facility] - In-pt  Chief Complaint:  Hepatic abscess  Subjective: Pt asleep; confusion persists   Allergies: Patient has no known allergies.  Medications: Prior to Admission medications   Medication Sig Start Date End Date Taking? Authorizing Provider  aspirin EC 81 MG tablet Take 81 mg by mouth daily.   Yes [provider]  bisoprolol (ZEBETA) 10 MG tablet Take 1 tablet (10 mg total) by mouth daily. 12/23/20  Yes Lauree Chandler, NP  fenofibrate 160 MG tablet Take 1 tablet (160 mg total) by mouth daily. 12/23/20  Yes Lauree Chandler, NP  finasteride (PROSCAR) 5 MG tablet Take 1 tablet (5 mg total) by mouth daily. 12/23/20  Yes Lauree Chandler, NP  ibuprofen (ADVIL) 600 MG tablet Take 1 tablet (600 mg total) by mouth every 8 (eight) hours as needed. 07/15/21  Yes Ngetich, Dinah C, NP  insulin glargine (LANTUS SOLOSTAR) 100 UNIT/ML Solostar Pen Inject 58 Units into the skin at bedtime. 12/23/20  Yes Eubanks, Carlos American, NP  LORazepam (ATIVAN) 1 MG tablet TAKE 1 TABLET BY MOUTH ONCE DAILY AT BEDTIME AS NEEDED FOR ANXIETY Patient taking differently: Take 1 mg by mouth at bedtime as needed for anxiety. 07/17/21  Yes Ngetich, Dinah C, NP  losartan (COZAAR) 50 MG tablet Take 1 tablet by mouth once daily Patient taking differently: Take 50 mg by mouth daily. 08/06/20  Yes [name], Tiffany L, DO  metFORMIN (GLUCOPHAGE) 1000 MG tablet Take 1 tablet (1,000 mg total) by mouth 2 (two) times daily with a meal. 12/25/19  Yes [name], Tiffany L, DO  Multiple Vitamins-Minerals (MULTIVITAMIN MEN PO) Take 1 capsule by mouth daily.   Yes [provider]  NOVOLIN R FLEXPEN RELION 100 UNIT/ML FlexPen INJECT 18 UNITS SUBCUTANEOUSLY TWICE DAILY AS DIRECTED BEFORE A MEAL Patient taking differently: Inject 18 Units into the skin in the morning and at bedtime. 06/23/21  Yes Ngetich, Dinah C, NP   omeprazole (PRILOSEC) 20 MG capsule Take 20 mg by mouth daily.   Yes [provider]  senna-docusate (SENOKOT-S) 8.6-50 MG tablet Take 1 tablet by mouth 4 (four) times daily as needed (constipation from pain med). 08/08/18  Yes [name], Tiffany L, DO  SYMBICORT 80-4.5 MCG/ACT inhaler INHALE 2 PUFFS BY MOUTH TWICE DAILY IN  THE  MORNING  AND  12  HOURS  LATER Patient taking differently: Inhale 2 puffs into the lungs in the morning and at bedtime. 02/28/21  Yes Ngetich, Dinah C, NP  tamsulosin (FLOMAX) 0.4 MG CAPS capsule Take 1 capsule (0.4 mg total) by mouth daily. 04/09/21  Yes Ngetich, Dinah C, NP  Tiotropium Bromide-Olodaterol (STIOLTO RESPIMAT) 2.5-2.5 MCG/ACT AERS Inhale 2 puffs into the lungs daily. 05/19/19  Yes [name], Tiffany L, DO  zolpidem (AMBIEN) 5 MG tablet TAKE 1 TABLET BY MOUTH AT BEDTIME AS NEEDED FOR SLEEP Patient taking differently: Take 5 mg by mouth at bedtime as needed for sleep. 07/17/21  Yes Ngetich, Dinah C, NP  Accu-Chek FastClix Lancets MISC Use to test blood sugar three times daily. DX: E11.9 04/26/19   [name], Tiffany L, DO  Blood Glucose Monitoring Suppl (ACCU-CHEK AVIVA PLUS) w/Device KIT Use to test blood sugar three time daily. DX: E11.9 04/26/19   [name], Tiffany L, DO  glucose blood (ACCU-CHEK AVIVA PLUS) test strip Accu Chek Aviva Plus Test Strips Use to test blood sugar three times daily. DX: E11.9 10/16/19   [name], Rexene Edison,  DO     Vital Signs: BP 127/62   Pulse 86   Temp 98.4 F (36.9 C) (Oral)   Resp 19   Ht 5' 7" (1.702 m)   Wt 196 lb 10.4 oz (89.2 kg)   SpO2 98%   BMI 30.80 kg/m   Physical Exam pt currently asleep; hepatic drain in place; insertion site ok, about 10-15 cc beige colored fluid in JP  Imaging: CT ABDOMEN PELVIS W CONTRAST  Result Date: 08/12/2021 CLINICAL DATA:  Hepatic abscess/intra-abdominal abscess follow-up. Status post drain placement. EXAM: CT ABDOMEN AND PELVIS WITH CONTRAST TECHNIQUE: Multidetector CT imaging of the abdomen and  pelvis was performed using the standard protocol following bolus administration of intravenous contrast. CONTRAST:  143m OMNIPAQUE IOHEXOL 300 MG/ML  SOLN COMPARISON:  CT abdomen and pelvis 08/06/2021. FINDINGS: Lower chest: Small bilateral pleural effusions are again seen. There is atelectasis in the bilateral lower lobes, increasing on the right. Hepatobiliary: Air-fluid collection compatible with hepatic abscess in the left lobe of the liver has mildly decreased in size now measuring 3.8 x 2.4 x 2.8 cm. There is a new percutaneous drainage catheter with distal tip along the anterior margin of the abscess. Left portal vein thrombosis is unchanged. No new liver lesions are seen. Subcentimeter hypodense lesions in the superior liver image 3/11 are unchanged. Gallstones are again noted. There is diffuse gallbladder wall edema which has increased. There is no biliary ductal dilatation. Pancreas: Unremarkable. No pancreatic ductal dilatation or surrounding inflammatory changes. Spleen: Normal in size without focal abnormality. Adrenals/Urinary Tract: Adrenal glands are unremarkable. Kidneys are normal, without renal calculi, focal lesion, or hydronephrosis. The bladder is distended and contains a small amount of air. Stomach/Bowel: Gastrojejunostomy tube is in place, unchanged. There is likely diffuse gastric wall thickening involving the antrum and distal body of the stomach with surrounding inflammation. Otherwise, small bowel and colon are within normal limits. The appendix is within normal limits. Vascular/Lymphatic: Aortic atherosclerosis. No enlarged abdominal or pelvic lymph nodes. Reproductive: Prostate gland is enlarged, unchanged. Other: There is small volume ascites similar to the prior examination. There is a small amount of free air in the anterior right abdomen image 3/42 which is not seen on the prior examination. Percutaneous catheter extending over the left lobe of the liver is unchanged in position.  Musculoskeletal: Diffuse body wall edema is unchanged. No acute fractures. IMPRESSION: 1. Left hepatic abscess has decreased in size now measuring 3.8 x 2.4 by 2.8 cm. Percutaneous drainage catheter in place in the anterior collection. 2. Cholelithiasis with increasing gallbladder wall edema. Correlate clinically for cholecystitis. 3. Gastrojejunostomy tube in place. There is wall thickening and inflammation surrounding the gastric body and antrum concerning for gastritis. 4. Small amount of free air in the right abdomen is new from prior examination, indeterminate. 5. Stable small volume ascites. 6. Distended bladder containing a small amount of air. Correlate for infection. 7. Stable pleural effusions and body wall edema. 8. Stable left portal vein thrombosis. Electronically Signed   By: ARonney AstersM.D.   On: 08/12/2021 18:09   DG Abd Portable 1V  Result Date: 08/11/2021 CLINICAL DATA:  Feeding tube placement EXAM: PORTABLE ABDOMEN - 1 VIEW COMPARISON:  08/11/2021 at 0823 hours FINDINGS: Limited radiograph of the lower chest and upper abdomen was obtained for the purposes of enteric tube localization. Enteric tube is seen coursing below the diaphragm with distal tip now terminating within the expected location of the duodenal jejunal junction. Percutaneous drainage catheter is again seen  overlying the left hepatic lobe. IMPRESSION: Enteric tube tip now terminating at the expected location of the duodenojejunal junction. Electronically Signed   By: Davina Poke D.O.   On: 08/11/2021 12:36   DG Abd Portable 1V  Result Date: 08/11/2021 CLINICAL DATA:  Feeding tube insertion.  Check position. EXAM: PORTABLE ABDOMEN - 1 VIEW COMPARISON:  08/08/2021 FINDINGS: The feeding tube tip is right of the midline in the expected location of the descending duodenum. Percutaneous drainage catheter within the left hepatic lobe is identified just below the left hemidiaphragm. No dilated bowel loops identified.  IMPRESSION: Feeding tube tip is in the descending duodenum. Electronically Signed   By: Kerby Moors M.D.   On: 08/11/2021 08:50    Labs:  CBC: Recent Labs    08/08/21 0454 08/09/21 0318 08/12/21 0414 08/13/21 0517  WBC 8.4 8.6 8.7 8.2  HGB 8.9* 9.0* 9.3* 9.4*  HCT 28.1* 28.0* 29.9* 30.1*  PLT 211 206 214 210    COAGS: Recent Labs    07/25/21 1647 07/30/21 2012 08/05/21 0449 08/07/21 0430  INR 1.3* 1.4* 1.2 1.2  APTT 26 29  --   --     BMP: Recent Labs    12/18/20 0953 02/04/21 0959 04/18/21 1353 08/09/21 2000 08/10/21 0401 08/11/21 0358 08/13/21 0517  NA 137 136   < > 134* 134* 134* 136  K 4.3 4.7   < > 3.8 3.9 3.9 3.8  CL 99 98   < > 97* 98 99 97*  CO2 27 28   < > _0 32  GLUCOSE 367* 474*   < > 144* 104* 122* 198*  BUN 12 12   < > _1 CALCIUM 9.6 9.9   < > 8.0* 7.8* 7.9* 7.9*  CREATININE 0.69* 0.76   < > 0.56* 0.47* 0.54* 0.49*  GFRNONAA 96 91   < > >60 >60 >60 >60  GFRAA 111 106  --   --   --   --   --    < > = values in this interval not displayed.    LIVER FUNCTION TESTS: Recent Labs    08/03/21 0304 08/04/21 0223 08/07/21 0429 08/11/21 0358  BILITOT 0.6 0.5 0.2* 0.4  AST _2 ALT _3 ALKPHOS 110 114 127* 111  PROT 4.5* 5.1* 5.1* 5.3*  ALBUMIN <1.5* <1.5* <1.5* <1.5*    Assessment and Plan: POD 18, s/p Exploratory laparotomy with drainage of pyogenic liver abscess x 2, Partial omentectomy for Ruptured pyogenic liver abscesses by Dr. Dema Severin - 07/25/2021; s/p left hepatic lobe abscess drain 12/1; cx- few enterococcus; afebrile; WBC 8.2; f/u CT A/P yesterday-  1. Left hepatic abscess has decreased in size now measuring 3.8 x 2.4 by 2.8 cm. Percutaneous drainage catheter in place in the anterior collection. 2. Cholelithiasis with increasing gallbladder wall edema. Correlate clinically for cholecystitis. 3. Gastrojejunostomy tube in place. There is wall thickening and inflammation surrounding the gastric body  and antrum concerning for gastritis. 4. Small amount of free air in the right abdomen is new from prior examination, indeterminate. 5. Stable small volume ascites. 6. Distended bladder containing a small amount of air. Correlate for infection. 7. Stable pleural effusions and body wall edema. 8. Stable left portal vein thrombosis.  Imaging reviewed by Dr. Kathlene Cote; cont current tx; will need f/u CT in 3 weeks; awaiting LTAC placement   Electronically Signed: D. Rowe Robert, PA-C 08/13/2021, 12:29 PM  I spent a total of 15 minutes at the the patient's bedside AND on the patient's hospital floor or unit, greater than 50% of which was counseling/coordinating care for hepatic abscess    Patient ID: Matthew Dominguez, male   DOB: [birth year], 70 y.o.   MRN: [id number]

## 2021-08-14 ENCOUNTER — Other Ambulatory Visit (HOSPITAL_COMMUNITY): Payer: Self-pay

## 2021-08-14 ENCOUNTER — Inpatient Hospital Stay
Admission: AD | Admit: 2021-08-14 | Discharge: 2021-10-08 | Disposition: E | Payer: Medicare HMO | Source: Other Acute Inpatient Hospital | Attending: Internal Medicine | Admitting: Internal Medicine

## 2021-08-14 DIAGNOSIS — J449 Chronic obstructive pulmonary disease, unspecified: Secondary | ICD-10-CM | POA: Diagnosis not present

## 2021-08-14 DIAGNOSIS — K766 Portal hypertension: Secondary | ICD-10-CM | POA: Diagnosis not present

## 2021-08-14 DIAGNOSIS — K7689 Other specified diseases of liver: Secondary | ICD-10-CM | POA: Diagnosis not present

## 2021-08-14 DIAGNOSIS — R188 Other ascites: Secondary | ICD-10-CM | POA: Diagnosis not present

## 2021-08-14 DIAGNOSIS — Z4682 Encounter for fitting and adjustment of non-vascular catheter: Secondary | ICD-10-CM | POA: Diagnosis not present

## 2021-08-14 DIAGNOSIS — J690 Pneumonitis due to inhalation of food and vomit: Secondary | ICD-10-CM | POA: Diagnosis not present

## 2021-08-14 DIAGNOSIS — R6520 Severe sepsis without septic shock: Secondary | ICD-10-CM | POA: Diagnosis present

## 2021-08-14 DIAGNOSIS — R918 Other nonspecific abnormal finding of lung field: Secondary | ICD-10-CM | POA: Diagnosis not present

## 2021-08-14 DIAGNOSIS — R0600 Dyspnea, unspecified: Secondary | ICD-10-CM | POA: Diagnosis not present

## 2021-08-14 DIAGNOSIS — I81 Portal vein thrombosis: Secondary | ICD-10-CM | POA: Diagnosis not present

## 2021-08-14 DIAGNOSIS — R410 Disorientation, unspecified: Secondary | ICD-10-CM | POA: Diagnosis not present

## 2021-08-14 DIAGNOSIS — J302 Other seasonal allergic rhinitis: Secondary | ICD-10-CM | POA: Diagnosis not present

## 2021-08-14 DIAGNOSIS — G894 Chronic pain syndrome: Secondary | ICD-10-CM

## 2021-08-14 DIAGNOSIS — J9620 Acute and chronic respiratory failure, unspecified whether with hypoxia or hypercapnia: Secondary | ICD-10-CM | POA: Diagnosis not present

## 2021-08-14 DIAGNOSIS — Z452 Encounter for adjustment and management of vascular access device: Secondary | ICD-10-CM

## 2021-08-14 DIAGNOSIS — J9622 Acute and chronic respiratory failure with hypercapnia: Secondary | ICD-10-CM | POA: Diagnosis not present

## 2021-08-14 DIAGNOSIS — Z4659 Encounter for fitting and adjustment of other gastrointestinal appliance and device: Secondary | ICD-10-CM

## 2021-08-14 DIAGNOSIS — A419 Sepsis, unspecified organism: Secondary | ICD-10-CM | POA: Diagnosis not present

## 2021-08-14 DIAGNOSIS — B3789 Other sites of candidiasis: Secondary | ICD-10-CM | POA: Diagnosis not present

## 2021-08-14 DIAGNOSIS — R197 Diarrhea, unspecified: Secondary | ICD-10-CM | POA: Diagnosis not present

## 2021-08-14 DIAGNOSIS — J9690 Respiratory failure, unspecified, unspecified whether with hypoxia or hypercapnia: Secondary | ICD-10-CM

## 2021-08-14 DIAGNOSIS — J9621 Acute and chronic respiratory failure with hypoxia: Secondary | ICD-10-CM | POA: Diagnosis not present

## 2021-08-14 DIAGNOSIS — J9601 Acute respiratory failure with hypoxia: Secondary | ICD-10-CM | POA: Diagnosis not present

## 2021-08-14 DIAGNOSIS — J90 Pleural effusion, not elsewhere classified: Secondary | ICD-10-CM | POA: Diagnosis not present

## 2021-08-14 DIAGNOSIS — R140 Abdominal distension (gaseous): Secondary | ICD-10-CM | POA: Diagnosis not present

## 2021-08-14 DIAGNOSIS — Z978 Presence of other specified devices: Secondary | ICD-10-CM

## 2021-08-14 DIAGNOSIS — E119 Type 2 diabetes mellitus without complications: Secondary | ICD-10-CM | POA: Diagnosis not present

## 2021-08-14 DIAGNOSIS — K912 Postsurgical malabsorption, not elsewhere classified: Secondary | ICD-10-CM | POA: Diagnosis not present

## 2021-08-14 DIAGNOSIS — E440 Moderate protein-calorie malnutrition: Secondary | ICD-10-CM | POA: Diagnosis not present

## 2021-08-14 DIAGNOSIS — I10 Essential (primary) hypertension: Secondary | ICD-10-CM | POA: Diagnosis not present

## 2021-08-14 DIAGNOSIS — E46 Unspecified protein-calorie malnutrition: Secondary | ICD-10-CM | POA: Diagnosis not present

## 2021-08-14 DIAGNOSIS — B370 Candidal stomatitis: Secondary | ICD-10-CM | POA: Diagnosis not present

## 2021-08-14 DIAGNOSIS — R0603 Acute respiratory distress: Secondary | ICD-10-CM

## 2021-08-14 DIAGNOSIS — K750 Abscess of liver: Secondary | ICD-10-CM | POA: Diagnosis not present

## 2021-08-14 DIAGNOSIS — K7460 Unspecified cirrhosis of liver: Secondary | ICD-10-CM | POA: Diagnosis not present

## 2021-08-14 DIAGNOSIS — K8020 Calculus of gallbladder without cholecystitis without obstruction: Secondary | ICD-10-CM | POA: Diagnosis not present

## 2021-08-14 DIAGNOSIS — J9811 Atelectasis: Secondary | ICD-10-CM | POA: Diagnosis not present

## 2021-08-14 LAB — BLOOD GAS, ARTERIAL
Acid-Base Excess: 7.6 mmol/L — ABNORMAL HIGH (ref 0.0–2.0)
Bicarbonate: 32.8 mmol/L — ABNORMAL HIGH (ref 20.0–28.0)
FIO2: 44
O2 Saturation: 87.5 %
Patient temperature: 37
pCO2 arterial: 56.5 mmHg — ABNORMAL HIGH (ref 32.0–48.0)
pH, Arterial: 7.381 (ref 7.350–7.450)
pO2, Arterial: 58.2 mmHg — ABNORMAL LOW (ref 83.0–108.0)

## 2021-08-14 LAB — HEPARIN LEVEL (UNFRACTIONATED)
Heparin Unfractionated: <0.1 IU/mL — ABNORMAL LOW (ref 0.30–0.70)
Heparin Unfractionated: 0.54 IU/mL (ref 0.30–0.70)
Heparin Unfractionated: 0.59 IU/mL (ref 0.30–0.70)

## 2021-08-14 LAB — GLUCOSE, CAPILLARY
Glucose-Capillary: 210 mg/dL — ABNORMAL HIGH (ref 70–99)
Glucose-Capillary: 210 mg/dL — ABNORMAL HIGH (ref 70–99)
Glucose-Capillary: 305 mg/dL — ABNORMAL HIGH (ref 70–99)

## 2021-08-14 LAB — COMPREHENSIVE METABOLIC PANEL
ALT: 16 U/L (ref 0–44)
AST: 31 U/L (ref 15–41)
Albumin: 1.7 g/dL — ABNORMAL LOW (ref 3.5–5.0)
Alkaline Phosphatase: 109 U/L (ref 38–126)
Anion gap: 5 (ref 5–15)
BUN: 15 mg/dL (ref 8–23)
CO2: 33 mmol/L — ABNORMAL HIGH (ref 22–32)
Calcium: 7.9 mg/dL — ABNORMAL LOW (ref 8.9–10.3)
Chloride: 98 mmol/L (ref 98–111)
Creatinine, Ser: 0.47 mg/dL — ABNORMAL LOW (ref 0.61–1.24)
GFR, Estimated: >60 mL/min (ref >60)
Glucose, Bld: 152 mg/dL — ABNORMAL HIGH (ref 70–99)
Potassium: 3.9 mmol/L (ref 3.5–5.1)
Sodium: 136 mmol/L (ref 135–145)
Total Bilirubin: 0.5 mg/dL (ref 0.3–1.2)
Total Protein: 5.9 g/dL — ABNORMAL LOW (ref 6.5–8.1)

## 2021-08-14 LAB — CBC
HCT: 31 % — ABNORMAL LOW (ref 39.0–52.0)
Hemoglobin: 9.8 g/dL — ABNORMAL LOW (ref 13.0–17.0)
MCH: 30.9 pg (ref 26.0–34.0)
MCHC: 31.6 g/dL (ref 30.0–36.0)
MCV: 97.8 fL (ref 80.0–100.0)
Platelets: 227 10*3/uL (ref 150–400)
RBC: 3.17 MIL/uL — ABNORMAL LOW (ref 4.22–5.81)
RDW: 16.1 % — ABNORMAL HIGH (ref 11.5–15.5)
WBC: 8.4 10*3/uL (ref 4.0–10.5)
nRBC: 0 % (ref 0.0–0.2)

## 2021-08-14 MED ORDER — QUETIAPINE FUMARATE 100 MG PO TABS: 100.0000 mg | ORAL_TABLET | Freq: Two times a day (BID) | ORAL | Status: AC

## 2021-08-14 MED ORDER — FUROSEMIDE 10 MG/ML IJ SOLN: 40.0000 mg | Freq: Two times a day (BID) | INTRAMUSCULAR | 0 refills | Status: AC

## 2021-08-14 MED ORDER — DOXAZOSIN MESYLATE 1 MG PO TABS: 1.0000 mg | ORAL_TABLET | Freq: Every day | ORAL | Status: AC

## 2021-08-14 MED ORDER — AMPICILLIN-SULBACTAM SODIUM 1.5 (1-0.5) G IJ SOLR: 1.5000 g | Freq: Four times a day (QID) | INTRAMUSCULAR | 0 refills | Status: AC

## 2021-08-14 MED ORDER — FUROSEMIDE 10 MG/ML IJ SOLN
40.0000 mg | Freq: Two times a day (BID) | INTRAMUSCULAR | Status: DC
Start: 2021-08-14 — End: 2021-08-14

## 2021-08-14 MED ORDER — METOPROLOL TARTRATE 5 MG/5ML IV SOLN: 10.0000 mg | Freq: Four times a day (QID) | INTRAVENOUS | Status: AC

## 2021-08-14 MED ORDER — INSULIN ASPART 100 UNIT/ML IJ SOLN: 3.0000 [IU] | Freq: Four times a day (QID) | INTRAMUSCULAR | Status: DC

## 2021-08-14 MED ORDER — HEPARIN (PORCINE) 25000 UT/250ML-% IV SOLN: 2450.0000 [IU]/h | INTRAVENOUS | Status: AC

## 2021-08-14 MED ORDER — INSULIN ASPART 100 UNIT/ML IJ SOLN: 3.0000 [IU] | Freq: Four times a day (QID) | INTRAMUSCULAR | 11 refills | Status: AC

## 2021-08-14 NOTE — Progress Notes (Signed)
    OVERNIGHT PROGRESS REPORT   Notified by RN for patient experiencing increased restlessness and inability to maintain BiPAP despite measures available. Due to prior past medical difficulties with oxygenation and medical history ABG is ordered and pending. Nursing will notify as needed.  Gershon Cull MSNA MSN [facility] Acute Care Nurse Practitioner [place]

## 2021-08-14 NOTE — Progress Notes (Signed)
Patient has good cough but has a tendency to cough secretions up to back of his throat.  Recommended consistent oral care and oral suctioning.  Patient increased to 10L [place] due to PO2 58 on ABG per RT.  O2 sats currently 99%.  Patient is in no respiratory distress and is currently calm in the bed.    RN to call if further assistance needed.

## 2021-08-14 NOTE — Progress Notes (Signed)
Pt had to be retrained due to him trying to hit staff on multiple occasions and pulling at medical equipment. Pt pulled IV pole and feeding pump and knocked them on floor. Pt had BIPAP on but he pulling it off on multiple occasions. Had to put nasal cannula back on. Had to due same thing on previous shift.

## 2021-08-14 NOTE — TOC Transition Note (Incomplete)
Transition of Care [facility]) - CM/SW Discharge Note   Patient Details  Name: Matthew Dominguez MRN: [id number] Date of Birth: 1950/10/28  [facility] CM/SW Contact:  Sharin Mons, RN Phone Number: 08/21/2021, 1:42 PM   Clinical Narrative:    Approval received for LTAC.    Final next level of care: Long Term Acute Care (LTAC) Barriers to Discharge: No Barriers Identified   Patient Goals and CMS Choice        Discharge Placement                       Discharge Plan and Services                                     Social Determinants of Health (SDOH) Interventions     Readmission Risk Interventions No flowsheet data found.

## 2021-08-14 NOTE — Progress Notes (Signed)
Pt is all over the bed. IV is out that heparin is in.  Pt restless w/o sleep for most of shift.

## 2021-08-14 NOTE — Progress Notes (Signed)
RT came to patient room to do AM nebulizer treatment and to obtain ABG that was ordered at 0638.  Patient noted to be sideways in the bed, having a congested cough, in restraints, and coarse breath sounds.  RT spoke with RN for patient and RN concerned in patient's status.  ABG obtained due to restlessness and potential for needing to go on bipap, after restraints removed, and sent down to main lab.  Rapid response RN called to inform of patient's status and stated she will come look at patient.  RN also paged MD in regards to her concern.  RT will wait for ABG results and rapid response RN for further care progression.  Will continue to monitor.

## 2021-08-14 NOTE — Discharge Summary (Addendum)
Physician Discharge Summary  Matthew Dominguez NLZ:[id number] DOB: Feb 21, 1951 DOA: 07/25/2021  PCP: Sandrea Hughs, NP  Admit date: 07/25/2021 Discharge date: 09/01/2021  Time spent: 45 minutes  Recommendations for Outpatient Follow-up:  Transition to oral Augmentin, to complete antibiotic course on 12/15 Repeat CT abdomen pelvis in 1 to 2 weeks Drain care-routine Gastroenterology follow-up for colonoscopy Discharge to [place]   Discharge Diagnoses:  Principal Problem:   Severe sepsis ([place]) Pneumoperitoneum Liver abscesses Acute hypoxic and hypercarbic respiratory failure   Diabetes mellitus type 2, controlled ([place])   Essential hypertension, benign   COPD (chronic obstructive pulmonary disease) ([place])   Mild cognitive impairment   Liver abscess   Malnutrition of moderate degree   Portal vein thrombosis   Hypertensive urgency   Ileus, postoperative ([place])   Acute metabolic encephalopathy   Acute hypercapnic respiratory failure ([place])   Fever   Acute urinary retention   Discharge Condition: Stable  Diet recommendation: Diabetic  Filed Weights   08/11/21 0432 08/12/21 0533 08/13/21 0958  Weight: 88.6 kg 89.2 kg 86.1 kg    History of present illness:  70/M with history of COPD, asthma, type 2 diabetes mellitus, BPH, hypothyroidism was admitted on 11/18 with sepsis, pyogenic liver abscess, underwent ex lap with partial omentectomy for ruptured pyogenic liver abscess, MRCP 11/23 noted numerous hepatic abscesses with left portal vein thrombosis extending to portal venous bifurcation, possible ascites, cholelithiasis, generalized bowel edema. -RUQ [place] 11/28 noted multiple complex liver lesions measuring <3 cm consistent with multiple liver abscesses, ascites. -Followed by general surgery ID, infectious disease -On 11/29 he was noted to have worsening respiratory distress, subsequently transferred to ICU, treated with BiPAP, did not require intubation -Also treated with heparin drip  for portal vein thrombosis -Was on TPN initially and then transitioned to tube feeds via NG tube -Transferred from ICU to [facility] service yesterday 12/7    Hospital Course:   Pyogenic liver abscesses Pneumoperitoneum -Etiology remains unclear -s/p ex lap, drainage of pyogenic abscesses, partial omentectomy Dr. Dema Severin 11/18-2 drains placed -Follow-up CT 11/28 noted improvement with persistent hepatic abscesses, s/p hepatic abscess drain in IR 12/1 -Infectious disease following-remains on IV Unasyn, abscess aspirate culture positive for Enterococcus faecalis, ID recommended transition to Augmentin when able to tolerate p.o., anticipate need for at least 4 weeks of antibiotics until 12/15 -IR and general surgery consulting, off TPN, tolerating tube feeds, SLP to follow-up and upgrade to PO intake slowly -GI recommended colonoscopy at some point when clinically stable to rule out malignancy seeding -CCS removed surgical drains 12/7, IR drain still in place -Will need repeat CT abdomen in about a week -Surgery recommended follow-up with Dr. Dema Severin in 4 weeks -He will be discharged to [facility] for prolonged rehab with concomitant medical care   Left portal vein thrombosis -Secondary to above infections -Antibiotics as above, currently on IV heparin, discussed with gastroenterology recommended anticoagulation for 3 to 6 months -Hemoglobin stable can be transition to oral anticoagulants when p.o. intake is reliable   Acute hypoxic respiratory failure -Secondary to fluid overload, underlying COPD, atelectasis/abdominal splinting -Continue IV Lasix, monitor I's/O, daily weights -Continue BiPAP nightly -Pulmonary toilet, Pulmicort and duo nebs twice daily -Out of bed to chair as tolerated, delirium complicating current issues   Agitation, ICU delirium -Currently on Seroquel -Slowly improving   COPD, asthma -Stable, nebs as above   Type 2 diabetes mellitus -CBGs poorly controlled, add NovoLog every  6 with tube feeds   DVT prophylaxis: Heparin GTT  Procedures s/p  Exploratory laparotomy with drainage of pyogenic liver abscess x 2, Partial omentectomy for Ruptured pyogenic liver abscesses by Dr. Dema Severin - 07/25/2021  s/p CT-guided hepatic abscess drain placement with IR on 12/1   Discharge Exam: Vitals:   08/27/2021 0742 08/08/2021 1151  BP: 129/67 (!) 145/62  Pulse: 95 (!) 102  Resp: (!) 21 (!) 21  Temp: 99 F (37.2 C) 99.2 F (37.3 C)  SpO2: 96% 99%    General exam: Elderly male sitting up in bed, awake alert oriented to self and place, partly to time, cognitive deficits noted HEENT: cortrak with tube feeds infusing CVS: S1-S2, regular rate rhythm Lungs: Poor air movement bilaterally Abdomen: Firm, mildly distended, mild right-sided tenderness, drain noted Extremities: 1+ edema Skin: No rash on exposed skin Psych: Poor insight and judgment     Discharge Instructions    Allergies as of 08/17/2021   No Known Allergies      Medication List     STOP taking these medications    Accu-Chek Aviva Plus test strip Generic drug: glucose blood   Accu-Chek Aviva Plus w/Device Kit   Accu-Chek FastClix Lancets Misc   aspirin EC 81 MG tablet   fenofibrate 160 MG tablet   finasteride 5 MG tablet Commonly known as: PROSCAR   ibuprofen 600 MG tablet Commonly known as: ADVIL   Lantus SoloStar 100 UNIT/ML Solostar Pen Generic drug: insulin glargine   LORazepam 1 MG tablet Commonly known as: ATIVAN   losartan 50 MG tablet Commonly known as: COZAAR   metFORMIN 1000 MG tablet Commonly known as: GLUCOPHAGE   MULTIVITAMIN MEN PO   NovoLIN R FlexPen ReliOn 100 UNIT/ML KwikPen Generic drug: Insulin Regular Human   senna-docusate 8.6-50 MG tablet Commonly known as: Senokot-S   tamsulosin 0.4 MG Caps capsule Commonly known as: FLOMAX   zolpidem 5 MG tablet Commonly known as: AMBIEN       TAKE these medications    ampicillin-sulbactam 1.5 (1-0.5) g Solr  injection Commonly known as: UNASYN 1.5GM Inject 1.5 g into the vein every 6 (six) hours.   bisoprolol 10 MG tablet Commonly known as: ZEBETA Take 1 tablet (10 mg total) by mouth daily.   doxazosin 1 MG tablet Commonly known as: CARDURA Take 1 tablet (1 mg total) by mouth daily. Start taking on: August 15, 2021   furosemide 10 MG/ML injection Commonly known as: LASIX Inject 4 mLs (40 mg total) into the vein 2 (two) times daily.   heparin 25000 UT/250ML infusion Inject 2,450 Units/hr into the vein continuous.   insulin aspart 100 UNIT/ML injection Commonly known as: novoLOG Inject 3 Units into the skin every 6 (six) hours.   metoprolol tartrate 5 MG/5ML Soln injection Commonly known as: LOPRESSOR Inject 10 mLs (10 mg total) into the vein every 6 (six) hours.   omeprazole 20 MG capsule Commonly known as: PRILOSEC Take 20 mg by mouth daily.   QUEtiapine 100 MG tablet Commonly known as: SEROQUEL Place 1 tablet (100 mg total) into feeding tube 2 (two) times daily.   Stiolto Respimat 2.5-2.5 MCG/ACT Aers Generic drug: Tiotropium Bromide-Olodaterol Inhale 2 puffs into the lungs daily.   Symbicort 80-4.5 MCG/ACT inhaler Generic drug: budesonide-formoterol INHALE 2 PUFFS BY MOUTH TWICE DAILY IN  THE  MORNING  AND  12  HOURS  LATER What changed:  how much to take how to take this when to take this additional instructions       No Known Allergies  Follow-up Information     [facility]   Specialty: Emergency Medicine Contact information: [address] [id number] [place] [address] [phone]        Ileana Roup, MD Follow up on 09/24/2021.   Specialties: General Surgery, Colon and Rectal Surgery Why: arriving at 10:30am for an 11am appointment time.  this will allow time for paperwork and check in process Contact information: [place] [address] [phone]                   The results of significant diagnostics from this hospitalization (including imaging, microbiology, ancillary and laboratory) are listed below for reference.    Significant Diagnostic Studies: DG Abd 1 View  Result Date: 07/28/2021 CLINICAL DATA:  Ileus EXAM: ABDOMEN - 1 VIEW COMPARISON:  Abdominal x-ray 07/26/2021 FINDINGS: Bilateral surgical drainage catheters identified in the upper abdomen. Loops of distended small bowel identified in the right lower abdomen measuring up to 3.3 cm in diameter. No suspicious calcifications. IMPRESSION: Abnormal bowel gas pattern which may represent ileus or obstruction. Continued follow-up recommended. Electronically Signed   By: Ofilia Neas M.D.   On: 07/28/2021 11:46   DG Abd 1 View  Result Date: 07/26/2021 CLINICAL DATA:  Check gastric catheter placement EXAM: ABDOMEN - 1 VIEW COMPARISON:  None. FINDINGS: Gastric catheter is noted within the stomach. Surgical drains are seen related to recent exploratory laparotomy. No definitive free air is seen. IMPRESSION: Gastric catheter within the stomach. Electronically Signed   By: Inez Catalina M.D.   On: 07/26/2021 20:23   CT Head Wo Contrast  Result Date: 07/25/2021 CLINICAL DATA:  Head and neck trauma. No C-Collar. Abd distension. EXAM: CT HEAD WITHOUT CONTRAST CT CERVICAL SPINE WITHOUT CONTRAST TECHNIQUE: Multidetector CT imaging of the head and cervical spine was performed following the standard protocol without intravenous contrast. Multiplanar CT image reconstructions of the cervical spine were also generated. COMPARISON:  None. FINDINGS: CT HEAD FINDINGS BRAIN: BRAIN Cerebral ventricle sizes are concordant with the degree of cerebral volume loss. No evidence of large-territorial acute infarction. No parenchymal hemorrhage. No mass lesion. No extra-axial collection. No mass effect or midline shift. No hydrocephalus. Basilar cisterns are patent. Vascular: No hyperdense vessel.  Atherosclerotic calcifications are present within the cavernous internal carotid arteries. Skull: No acute fracture or focal lesion. Slightly limited evaluation due to motion artifact. Sinuses/Orbits: Paranasal sinuses and mastoid air cells are clear. Bilateral lens replacement. Otherwise orbits are unremarkable. Other: None. CT CERVICAL SPINE FINDINGS Alignment: Normal. Skull base and vertebrae: Anterior fusion surgical hardware at the C5 through C7 levels. No definite acute fracture with slightly limited evaluation due to motion artifact. No aggressive appearing focal osseous lesion or focal pathologic process. Soft tissues and spinal canal: No prevertebral fluid or swelling. No visible canal hematoma. Upper chest: Unremarkable. Other: None. IMPRESSION: 1. No acute intracranial abnormality. 2. No acute displaced fracture or traumatic listhesis of the cervical spine. 3. Slightly limited evaluation due to motion artifact. Electronically Signed   By: Iven Finn M.D.   On: 07/25/2021 18:48   CT Cervical Spine Wo Contrast  Result Date: 07/25/2021 CLINICAL DATA:  Head and neck trauma. No C-Collar. Abd distension. EXAM: CT HEAD WITHOUT CONTRAST CT CERVICAL SPINE WITHOUT CONTRAST TECHNIQUE: Multidetector CT imaging of the head and cervical spine was performed following the standard protocol without intravenous contrast. Multiplanar CT image reconstructions of the cervical spine were also generated. COMPARISON:  None. FINDINGS: CT HEAD FINDINGS BRAIN: BRAIN Cerebral ventricle sizes  are concordant with the degree of cerebral volume loss. No evidence of large-territorial acute infarction. No parenchymal hemorrhage. No mass lesion. No extra-axial collection. No mass effect or midline shift. No hydrocephalus. Basilar cisterns are patent. Vascular: No hyperdense vessel. Atherosclerotic calcifications are present within the cavernous internal carotid arteries. Skull: No acute fracture or focal lesion. Slightly limited  evaluation due to motion artifact. Sinuses/Orbits: Paranasal sinuses and mastoid air cells are clear. Bilateral lens replacement. Otherwise orbits are unremarkable. Other: None. CT CERVICAL SPINE FINDINGS Alignment: Normal. Skull base and vertebrae: Anterior fusion surgical hardware at the C5 through C7 levels. No definite acute fracture with slightly limited evaluation due to motion artifact. No aggressive appearing focal osseous lesion or focal pathologic process. Soft tissues and spinal canal: No prevertebral fluid or swelling. No visible canal hematoma. Upper chest: Unremarkable. Other: None. IMPRESSION: 1. No acute intracranial abnormality. 2. No acute displaced fracture or traumatic listhesis of the cervical spine. 3. Slightly limited evaluation due to motion artifact. Electronically Signed   By: Iven Finn M.D.   On: 07/25/2021 18:48   CT ABDOMEN PELVIS W CONTRAST  Result Date: 08/12/2021 CLINICAL DATA:  Hepatic abscess/intra-abdominal abscess follow-up. Status post drain placement. EXAM: CT ABDOMEN AND PELVIS WITH CONTRAST TECHNIQUE: Multidetector CT imaging of the abdomen and pelvis was performed using the standard protocol following bolus administration of intravenous contrast. CONTRAST:  117m OMNIPAQUE IOHEXOL 300 MG/ML  SOLN COMPARISON:  CT abdomen and pelvis 08/06/2021. FINDINGS: Lower chest: Small bilateral pleural effusions are again seen. There is atelectasis in the bilateral lower lobes, increasing on the right. Hepatobiliary: Air-fluid collection compatible with hepatic abscess in the left lobe of the liver has mildly decreased in size now measuring 3.8 x 2.4 x 2.8 cm. There is a new percutaneous drainage catheter with distal tip along the anterior margin of the abscess. Left portal vein thrombosis is unchanged. No new liver lesions are seen. Subcentimeter hypodense lesions in the superior liver image 3/11 are unchanged. Gallstones are again noted. There is diffuse gallbladder wall edema  which has increased. There is no biliary ductal dilatation. Pancreas: Unremarkable. No pancreatic ductal dilatation or surrounding inflammatory changes. Spleen: Normal in size without focal abnormality. Adrenals/Urinary Tract: Adrenal glands are unremarkable. Kidneys are normal, without renal calculi, focal lesion, or hydronephrosis. The bladder is distended and contains a small amount of air. Stomach/Bowel: Gastrojejunostomy tube is in place, unchanged. There is likely diffuse gastric wall thickening involving the antrum and distal body of the stomach with surrounding inflammation. Otherwise, small bowel and colon are within normal limits. The appendix is within normal limits. Vascular/Lymphatic: Aortic atherosclerosis. No enlarged abdominal or pelvic lymph nodes. Reproductive: Prostate gland is enlarged, unchanged. Other: There is small volume ascites similar to the prior examination. There is a small amount of free air in the anterior right abdomen image 3/42 which is not seen on the prior examination. Percutaneous catheter extending over the left lobe of the liver is unchanged in position. Musculoskeletal: Diffuse body wall edema is unchanged. No acute fractures. IMPRESSION: 1. Left hepatic abscess has decreased in size now measuring 3.8 x 2.4 by 2.8 cm. Percutaneous drainage catheter in place in the anterior collection. 2. Cholelithiasis with increasing gallbladder wall edema. Correlate clinically for cholecystitis. 3. Gastrojejunostomy tube in place. There is wall thickening and inflammation surrounding the gastric body and antrum concerning for gastritis. 4. Small amount of free air in the right abdomen is new from prior examination, indeterminate. 5. Stable small volume ascites. 6. Distended bladder containing  a small amount of air. Correlate for infection. 7. Stable pleural effusions and body wall edema. 8. Stable left portal vein thrombosis. Electronically Signed   By: Ronney Asters M.D.   On: 08/12/2021  18:09   CT ABDOMEN PELVIS W CONTRAST  Result Date: 08/06/2021 CLINICAL DATA:  Abdominal abscess/infection suspected. EXAM: CT ABDOMEN AND PELVIS WITH CONTRAST TECHNIQUE: Multidetector CT imaging of the abdomen and pelvis was performed using the standard protocol following bolus administration of intravenous contrast. CONTRAST:  187m OMNIPAQUE IOHEXOL 350 MG/ML SOLN COMPARISON:  Ultrasound 2 days ago.  CT 07/25/2021. FINDINGS: Lower chest: Bilateral pleural effusions layering dependently. Atelectasis of both lower lobes, more extensive on the left than the right. Hepatobiliary: Diminishing areas of low-density and air within the left lobe of the liver consistent with improving abscesses. Surgical drain between the left lobe of the liver and the diaphragm, with only a small amount of fluid present along the superior margin of the liver. Calcified gallstones dependent in the gallbladder as seen previously. Ongoing thrombosis of the left portal vein, with a small amount of thrombus in the right main portal vein. Pancreas: Normal Spleen: Normal Adrenals/Urinary Tract: Adrenal glands are normal. Kidneys are normal. Catheter in the bladder. Stomach/Bowel: Stomach and small intestine appear normal. Some fluid levels in the colon suggesting diarrhea. No focal colon pathology identified. Vascular/Lymphatic: Aortic atherosclerosis. No aneurysm. IVC is normal. No adenopathy. Reproductive: Enlarged prostate. Other: Increase in the amount of free intraperitoneal fluid without focal loculation. Musculoskeletal: Chronic degenerative changes affect the spine. Chronic bilateral pars defects at L5. IMPRESSION: Bilateral pleural effusions layering dependently with lower lobe atelectasis left worse than right. Surgical drain in place extending between the dome of the liver and the diaphragm. Diminishing areas of low-density and air within the left lobe of the liver consistent with improving liver abscesses. Continued demonstration  of thrombosis of the left portal vein and partial thrombosis of the right portal vein. Increase in amount of generalized ascites. Chololithiasis as seen previously. Electronically Signed   By: MNelson ChimesM.D.   On: 08/06/2021 13:09   CT Abdomen Pelvis W Contrast  Result Date: 07/25/2021 CLINICAL DATA:  Abdominal pain and distension. EXAM: CT ABDOMEN AND PELVIS WITH CONTRAST TECHNIQUE: Multidetector CT imaging of the abdomen and pelvis was performed using the standard protocol following bolus administration of intravenous contrast. CONTRAST:  1080mOMNIPAQUE IOHEXOL 300 MG/ML  SOLN COMPARISON:  None. FINDINGS: Evaluation is very limited due to respiratory motion artifact and streak artifact caused by patient's arms. Lower chest: The visualized lung bases are clear. There is pneumoperitoneum and small ascites in the upper abdomen. Hepatobiliary: Fatty liver.  Multiple gallstones. Pancreas: Unremarkable. No pancreatic ductal dilatation or surrounding inflammatory changes. Spleen: Normal in size without focal abnormality. Adrenals/Urinary Tract: The adrenal glands are unremarkable. The kidneys, visualized ureters, and the urinary bladder are unremarkable. Stomach/Bowel: There is possible area of loss of integrity of the wall the stomach which may represent gastric perforation or rupture. Evaluation however is very limited due to respiratory and streak artifact. There is moderate stool throughout the colon. No bowel obstruction. The appendix is normal. Vascular/Lymphatic: Moderate aortoiliac atherosclerotic disease. The IVC is unremarkable. No portal venous gas. There is no adenopathy. Reproductive: The prostate gland is enlarged measuring 6 cm in transverse axial diameter. The seminal vesicles are symmetric. Small right hydrocele. Other: None Musculoskeletal: Degenerative changes of the spine. Bilateral L5 pars defects. No acute osseous pathology. IMPRESSION: 1. Pneumoperitoneum and small ascites in the upper  abdomen  possibly related to gastric perforation or rupture. Surgical consult is advised. 2. Cholelithiasis. 3. Fatty liver. 4. Enlarged prostate gland. 5. Aortic Atherosclerosis (ICD10-I70.0). These results were called by telephone at the time of interpretation on 07/25/2021 at 6:36 pm to Dr Roderic Palau, who verbally acknowledged these results. Electronically Signed   By: Anner Crete M.D.   On: 07/25/2021 18:41   MR ABDOMEN MRCP WO CONTRAST  Addendum Date: 07/30/2021   ADDENDUM REPORT: 07/30/2021 19:17 ADDENDUM: Follow-up CT imaging may be helpful to assess portal vein thrombus to guide further management in this patient who has recently undergone abdominal exploration and abscess evacuation. These results were called by telephone at the time of interpretation on 07/30/2021 at 7:16 pm to provider Dr. Jeannene Patella, Who verbally acknowledged these results. Electronically Signed   By: Zetta Bills M.D.   On: 07/30/2021 19:17   Result Date: 07/30/2021 CLINICAL DATA:  A 70 year old male presents for evaluation following washout of the abdomen in the setting of ruptured pyogenic hepatic abscesses. EXAM: MRI ABDOMEN WITHOUT CONTRAST  (INCLUDING MRCP) TECHNIQUE: Multiplanar multisequence MR imaging was obtained, unfortunately this imaging is extremely limited due to patient agitation and inability to hold his breath or follow commands for the examination. After 3 T2 weighted sequences the patient was returned to the floor, unable to complete the evaluation. Only limited imaging is available and is reported below. COMPARISON:  CT of the abdomen and pelvis from November of 2022 on July 25, 2021. FINDINGS: Lower chest: Bilateral effusions and basilar airspace disease not well evaluated on abdominal MRI. Hepatobiliary: Numerous hepatic lesions, subtle areas of hypodensity seen on previous CT imaging. Suspected portal thrombosis of LEFT portal vein and potentially the portal bifurcation in the porta hepatis. Flow void is  seen in the main portal vein and RIGHT portal venous branches. Surgical drains pass underneath the LEFT hemi liver. A residual cavity is noted in the LEFT hepatic lobe with maximal dimensions 5.6 x 4.8 cm (image 12/9). Multifocal areas of T2 hyperintensity are noted in mainly the LEFT hepatic lobe both medial and lateral segments. Next largest is near the portal venous confluence in hepatic subsegment IV B (image 9/9) 24 mm. Smaller lesions also present in the caudate at least 10 additional lesions are present. Peri hepatic fluid is noted anterior to the liver. Signs of cholelithiasis. Large gallstones are present in the gallbladder. Low signal structure near the cystic/hepatic duct confluence measuring 7 mm not well delineated (image 20/9) potentially a stone in the neck of the gallbladder or cystic duct. Mild intrahepatic biliary duct distension but no substantial extrahepatic biliary duct distension. Large gallstones in the gallbladder measure up to 2.5 cm in the gallbladder neck. There is pericholecystic fluid. Pancreas: Generalized edema about the pancreas is of uncertain significance in light of generalized edema throughout the abdomen in this patient with ruptured hepatic abscesses. Spleen:  Spleen normal size and contour. Adrenals/Urinary Tract: Adrenal glands are normal. No hydronephrosis. Stomach/Bowel: Generalized bowel edema. Small amount of fluid below the greater curvature of the stomach measuring approximately 7 x 3.0 cm Vascular/Lymphatic:  Portal vein thrombosis as described. Other: Signs of ascites in addition to the areas of fluid described above. Surgical drains in place entering via RIGHT and LEFT sub costal approach. Musculoskeletal: No acute bony findings on limited assessment. Body wall edema. IMPRESSION: Numerous hepatic abscesses in this patient with LEFT portal thrombosis extending into portal venous bifurcation. Fluid in the abdomen may represent ascites in the setting of portal vein  thrombus developing  fluid collections in this patient with ruptured hepatic abscess. Surgical drains remain in place in the upper abdomen. Cholelithiasis. Potential stone in the cystic duct. Flow related artifact in the area of hepatic arterial branches could account for some of these findings. The extrahepatic biliary tree aside from the gallbladder is essentially not well assessed on the current study. Bilateral effusions and basilar airspace disease not well evaluated on abdominal MRI. Mild intrahepatic biliary duct distension but no substantial extrahepatic biliary duct distension. Generalized bowel edema as described. A call is out to the referring provider to further discuss findings in the above case. Electronically Signed: By: Zetta Bills M.D. On: 07/30/2021 19:08   DG Chest Port 1 View  Result Date: 08/08/2021 CLINICAL DATA:  Respiratory failure. EXAM: PORTABLE CHEST 1 VIEW COMPARISON:  August 07, 2021. FINDINGS: Stable cardiomediastinal silhouette. Right lung is clear. Slightly improved left basilar atelectasis or pneumonia is noted. Right-sided PICC line is again noted. No pneumothorax is noted. Bony thorax is unremarkable. IMPRESSION: Slightly improved left basilar atelectasis or infiltrate. Electronically Signed   By: Marijo Conception M.D.   On: 08/08/2021 09:18   DG CHEST PORT 1 VIEW  Result Date: 08/07/2021 CLINICAL DATA:  Respiratory failure EXAM: PORTABLE CHEST 1 VIEW COMPARISON:  08/06/2021 FINDINGS: Progression of left lower lobe airspace consolidation as well as left effusion. Right lung remains clear. Negative for heart failure or edema Right arm PICC tip in the SVC unchanged. IMPRESSION: Progressive left lower lobe airspace disease and left effusion. Possible pneumonia. Electronically Signed   By: Franchot Gallo M.D.   On: 08/07/2021 15:43   DG Chest Port 1 View  Result Date: 08/06/2021 CLINICAL DATA:  Respiratory abnormality.  Hypoxia. EXAM: PORTABLE CHEST 1 VIEW COMPARISON:   Yesterday FINDINGS: Numerous leads and wires project over the chest. Cervical spine fixation. Mild cardiomegaly. Atherosclerosis in the transverse aorta. Right PICC line tip at superior caval/atrial junction. Small left pleural effusion is unchanged. No pneumothorax. Right perihilar airspace disease is slightly increased. Left lower lobe airspace disease is unchanged. Mild pulmonary venous congestion. IMPRESSION: Increased right perihilar and similar left lower lobe airspace disease, suspicious for pneumonia. Underlying cardiomegaly and mild pulmonary venous congestion. Similar small left pleural effusion. Aortic Atherosclerosis (ICD10-I70.0). Electronically Signed   By: Abigail Miyamoto M.D.   On: 08/06/2021 07:58   DG CHEST PORT 1 VIEW  Result Date: 08/05/2021 CLINICAL DATA:  Hypoxia EXAM: PORTABLE CHEST 1 VIEW COMPARISON:  Chest radiograph 1 day prior FINDINGS: A right upper extremity PICC is in stable position terminating at the cavoatrial junction. The cardiac silhouette is enlarged, unchanged. The upper mediastinal contours are stable. Patchy opacities projecting over the right midlung have significantly improved. There remains left basilar/retrocardiac consolidation, not significantly changed. There is a probable trace left pleural effusion. There is no significant right pleural effusion. There is no pneumothorax. There is no acute osseous abnormality. IMPRESSION: Patchy opacities in the right midlung have significantly improved. Left basilar/retrocardiac consolidation is unchanged, and there is a probable trace left pleural effusion. Electronically Signed   By: Valetta Mole M.D.   On: 08/05/2021 10:13   DG CHEST PORT 1 VIEW  Result Date: 08/04/2021 CLINICAL DATA:  Hypoxia. EXAM: PORTABLE CHEST 1 VIEW COMPARISON:  Chest radiograph dated 07/29/2021. FINDINGS: Right-sided PICC with tip at the cavoatrial junction. Shallow inspiration. Left lung base and right mid lung field densities may represent  atelectasis or infiltrate. No large pleural effusion. No pneumothorax. Stable cardiomegaly. No acute osseous pathology. Lower cervical ACDF.  IMPRESSION: 1. Left lung base and right mid lung field densities may represent atelectasis or infiltrate. 2. Stable cardiomegaly. Electronically Signed   By: Anner Crete M.D.   On: 08/04/2021 19:44   DG Chest Port 1 View  Result Date: 07/29/2021 CLINICAL DATA:  Shortness of breath. EXAM: PORTABLE CHEST 1 VIEW COMPARISON:  Chest x-ray 07/27/2021. FINDINGS: Mediastinum stable. Stable cardiomegaly. Left mid and lower lung infiltrates noted on today's exam. Tiny left pleural effusion cannot be excluded. Right costophrenic angle not imaged. No pneumothorax. Prior cervical fusion. IMPRESSION: 1. Left mid lung and left base pulmonary infiltrates noted on today's exam. Tiny left pleural effusion cannot be excluded. 2. Stable cardiomegaly. Electronically Signed   By: Marcello Moores  Register M.D.   On: 07/29/2021 06:18   DG Chest Port 1 View  Result Date: 07/27/2021 CLINICAL DATA:  Follow-up postextubation. EXAM: PORTABLE CHEST 1 VIEW COMPARISON:  Portable chest yesterday at 4:04 a.m. FINDINGS: 4:31 a.m., 07/27/2021. Interval extubation and removal NGT. There is cardiomegaly with stable mild central vascular distension without overt findings of edema. There is increased left infrahilar opacity which could be atelectasis or developing infiltrate. The remaining lungs are clear and no pleural effusion is seen. There are overlying monitor wires and 3 level lower cervical ACDF plating. IMPRESSION: 1. A possible developing left infrahilar infiltrate or atelectasis. 2. Cardiomegaly, stable mild central vascular fullness.  No edema. 3. Interval extubation, removal of NGT. Electronically Signed   By: Telford Nab M.D.   On: 07/27/2021 05:03   DG CHEST PORT 1 VIEW  Result Date: 07/26/2021 CLINICAL DATA:  Respiratory failure. EXAM: PORTABLE CHEST 1 VIEW COMPARISON:  07/25/2021  FINDINGS: 0404 hours. Endotracheal tube tip is 4.9 cm above the base of the carina. The NG tube passes into the stomach although the distal tip position is not included on the film. The lungs are clear without focal pneumonia, edema, pneumothorax or pleural effusion. The cardiopericardial silhouette is within normal limits for size. Telemetry leads overlie the chest. IMPRESSION: No acute cardiopulmonary findings. Electronically Signed   By: Misty Stanley M.D.   On: 07/26/2021 09:50   DG Chest Port 1 View  Result Date: 07/25/2021 CLINICAL DATA:  Patient found down in bathroom EXAM: PORTABLE CHEST 1 VIEW COMPARISON:  08/16/2018 FINDINGS: The heart size and mediastinal contours are within normal limits. Both lungs are clear. The visualized skeletal structures are unremarkable. Degenerative changes are noted in both AC joints. There is previous surgical fusion in the lower cervical spine. IMPRESSION: No active disease is seen in the chest. Electronically Signed   By: Elmer Picker M.D.   On: 07/25/2021 16:11   DG Abd Portable 1V  Result Date: 08/11/2021 CLINICAL DATA:  Feeding tube placement EXAM: PORTABLE ABDOMEN - 1 VIEW COMPARISON:  08/11/2021 at 0823 hours FINDINGS: Limited radiograph of the lower chest and upper abdomen was obtained for the purposes of enteric tube localization. Enteric tube is seen coursing below the diaphragm with distal tip now terminating within the expected location of the duodenal jejunal junction. Percutaneous drainage catheter is again seen overlying the left hepatic lobe. IMPRESSION: Enteric tube tip now terminating at the expected location of the duodenojejunal junction. Electronically Signed   By: Davina Poke D.O.   On: 08/11/2021 12:36   DG Abd Portable 1V  Result Date: 08/11/2021 CLINICAL DATA:  Feeding tube insertion.  Check position. EXAM: PORTABLE ABDOMEN - 1 VIEW COMPARISON:  08/08/2021 FINDINGS: The feeding tube tip is right of the midline in the expected  location of the descending duodenum. Percutaneous drainage catheter within the left hepatic lobe is identified just below the left hemidiaphragm. No dilated bowel loops identified. IMPRESSION: Feeding tube tip is in the descending duodenum. Electronically Signed   By: Kerby Moors M.D.   On: 08/11/2021 08:50   DG Abd Portable 1V  Result Date: 08/08/2021 CLINICAL DATA:  Feeding tube placement. EXAM: PORTABLE ABDOMEN - 1 VIEW COMPARISON:  Abdominal radiograph 07/29/2021 FINDINGS: Enteric tube tip crosses the midline, projecting at the expected location of the second portion of the duodenum. Nonobstructive bowel gas pattern. Percutaneous left hepatic drain is in stable position. Left lower lobe atelectasis in the visualized lung bases. IMPRESSION: Enteric tube tip is post-pyloric, projecting at the expected location of second portion of the duodenum. Electronically Signed   By: Ileana Roup M.D.   On: 08/08/2021 12:33   DG Abd Portable 1V  Result Date: 07/29/2021 CLINICAL DATA:  Abdominal pain and distention EXAM: PORTABLE ABDOMEN - 1 VIEW COMPARISON:  07/28/2021 FINDINGS: There is mild to moderate dilation of small-bowel loops measuring up to 3.7 cm in diameter. There is interval increase in degree of small bowel dilation. Stomach is moderately distended. There is prominence of mucosal folds in the margin of the stomach. There is no significant dilation of colon. IMPRESSION: There is interval worsening of small bowel dilation suggesting ileus or partial obstruction. Electronically Signed   By: Elmer Picker M.D.   On: 07/29/2021 09:53   ECHOCARDIOGRAM COMPLETE BUBBLE STUDY  Result Date: 07/28/2021    ECHOCARDIOGRAM REPORT   Patient Name:   Matthew Dominguez Date of Exam: 07/28/2021 Medical Rec #:  [id number]     Height:       67.0 in Accession #:    [id number]    Weight:       189.4 lb Date of Birth:  Nov 14, 1950      BSA:          1.976 m Patient Age:    65 years      BP:           92/49 mmHg Patient  Gender: M             HR:           90 bpm. Exam Location:  Inpatient Procedure: 2D Echo, Cardiac Doppler, Color Doppler, Saline Contrast Bubble Study            and Intracardiac Opacification Agent Indications:    Absess of liver, bubble  History:        Patient has no prior history of Echocardiogram examinations.                 COPD; Risk Factors:Diabetes.  Sonographer:    Glo Herring Referring Phys: [id number] [facility]  Sonographer Comments: Technically difficult study due to poor echo windows. IMPRESSIONS  1. Left ventricular ejection fraction, by estimation, is 60 to 65%. The left ventricle has normal function. The left ventricle has no regional wall motion abnormalities. Left ventricular diastolic parameters are consistent with Grade I diastolic dysfunction (impaired relaxation).  2. Right ventricular systolic function is normal. The right ventricular size is normal. Tricuspid regurgitation signal is inadequate for assessing PA pressure.  3. The mitral valve is grossly normal. Trivial mitral valve regurgitation.  4. The aortic valve was not well visualized. Aortic valve regurgitation is not visualized. Aortic valve mean gradient measures 6.0 mmHg.  5. The inferior vena cava is normal in size with greater than 50% respiratory  variability, suggesting right atrial pressure of 3 mmHg.  6. Agitated saline contrast bubble study was negative, with no evidence of any interatrial shunt. Comparison(s): No prior Echocardiogram. FINDINGS  Left Ventricle: Left ventricular ejection fraction, by estimation, is 60 to 65%. The left ventricle has normal function. The left ventricle has no regional wall motion abnormalities. Definity contrast agent was given IV to delineate the left ventricular  endocardial borders. The left ventricular internal cavity size was normal in size. There is no left ventricular hypertrophy. Left ventricular diastolic parameters are consistent with Grade I diastolic dysfunction (impaired  relaxation). Indeterminate filling pressures. Right Ventricle: The right ventricular size is normal. No increase in right ventricular wall thickness. Right ventricular systolic function is normal. Tricuspid regurgitation signal is inadequate for assessing PA pressure. Left Atrium: Left atrial size was normal in size. Right Atrium: Right atrial size was normal in size. Pericardium: There is no evidence of pericardial effusion. Mitral Valve: The mitral valve is grossly normal. Trivial mitral valve regurgitation. Tricuspid Valve: The tricuspid valve is grossly normal. Tricuspid valve regurgitation is trivial. Aortic Valve: The aortic valve was not well visualized. Aortic valve regurgitation is not visualized. Aortic valve mean gradient measures 6.0 mmHg. Aortic valve peak gradient measures 11.2 mmHg. Pulmonic Valve: The pulmonic valve was not well visualized. Pulmonic valve regurgitation is not visualized. Aorta: The aortic root and ascending aorta are structurally normal, with no evidence of dilitation. Venous: The inferior vena cava is normal in size with greater than 50% respiratory variability, suggesting right atrial pressure of 3 mmHg. IAS/Shunts: No atrial level shunt detected by color flow Doppler. Agitated saline contrast was given intravenously to evaluate for intracardiac shunting. Agitated saline contrast bubble study was negative, with no evidence of any interatrial shunt.   Diastology LV e' medial:    5.87 cm/s LV E/e' medial:  14.8 LV e' lateral:   6.42 cm/s LV E/e' lateral: 13.6  RIGHT VENTRICLE             IVC RV S prime:     14.70 cm/s  IVC diam: 1.80 cm LEFT ATRIUM             Index LA Vol (A2C):   26.3 ml 13.31 ml/m LA Vol (A4C):   28.8 ml 14.58 ml/m LA Biplane Vol: 28.2 ml 14.27 ml/m  AORTIC VALVE AV Vmax:           167.00 cm/s AV Vmean:          111.000 cm/s AV VTI:            0.280 m AV Peak Grad:      11.2 mmHg AV Mean Grad:      6.0 mmHg LVOT Vmax:         155.00 cm/s LVOT Vmean:         103.000 cm/s LVOT VTI:          0.266 m LVOT/AV VTI ratio: 0.95  AORTA Ao Root diam: 3.10 cm MITRAL VALVE MV Area (PHT): 3.46 cm     SHUNTS MV Decel Time: 219 msec     Systemic VTI: 0.27 m MV E velocity: 87.00 cm/s MV A velocity: 107.00 cm/s MV E/A ratio:  0.81 Lyman Bishop MD Electronically signed by Lyman Bishop MD Signature Date/Time: 07/28/2021/11:59:25 AM    Final    DG HIP UNILAT WITH PELVIS 2-3 VIEWS LEFT  Result Date: 07/25/2021 CLINICAL DATA:  Weakness and lightheadedness. EXAM: DG HIP (WITH OR WITHOUT PELVIS) 2-3V LEFT COMPARISON:  None. FINDINGS: There is no evidence of hip fracture or dislocation. There is no evidence of arthropathy or other focal bone abnormality. IMPRESSION: Negative. Electronically Signed   By: Ronney Asters M.D.   On: 07/25/2021 18:20   DG HIP UNILAT WITH PELVIS 2-3 VIEWS RIGHT  Result Date: 07/25/2021 CLINICAL DATA:  Fall. EXAM: DG HIP (WITH OR WITHOUT PELVIS) 2-3V RIGHT COMPARISON:  Right hip x-ray 08/16/2018. FINDINGS: There is no evidence of hip fracture or dislocation. There are mild degenerative changes of both hips with sclerosis and osteophyte formation. There are vascular calcifications in the soft tissues. IMPRESSION: No fracture or dislocation. Electronically Signed   By: Ronney Asters M.D.   On: 07/25/2021 18:25   CT IMAGE GUIDED DRAINAGE BY PERCUTANEOUS CATHETER  Result Date: 08/07/2021 INDICATION: Hepatic abscess EXAM: CT-guided intra-abdominal drain placement into hepatic abscess MEDICATIONS: The patient is currently admitted to the hospital and receiving intravenous antibiotics. The antibiotics were administered within an appropriate time frame prior to the initiation of the procedure. ANESTHESIA/SEDATION: Moderate (conscious) sedation was employed during this procedure. A total of Versed 2.5 mg and Fentanyl 75 mcg was administered intravenously by the radiology nurse. Total intra-service moderate Sedation Time: 21 minutes. The patient's level of  consciousness and vital signs were monitored continuously by radiology nursing throughout the procedure under my direct supervision. COMPLICATIONS: None immediate. PROCEDURE: Informed written consent was obtained from the patient after a thorough discussion of the procedural risks, benefits and alternatives. All questions were addressed. Maximal Sterile Barrier Technique was utilized including caps, mask, sterile gowns, sterile gloves, sterile drape, hand hygiene and skin antiseptic. A timeout was performed prior to the initiation of the procedure. The patient was placed supine on the exam table. Limited CT of the upper abdomen was performed for planning purposes. This again demonstrated a hypodense fluid collection in the left hepatic lobe with foci of gas, consistent with concern for abscess. Skin entry site was marked, and the overlying skin was prepped and draped in the standard sterile fashion. Local analgesia was obtained with 1% lidocaine. Using intermittent CT fluoroscopy, a 19 gauge Yueh needle was advanced into the abscess identified in the left hepatic lobe. Location was confirmed with CT and return of purulent material. Subsequently, over an Amplatz wire, the subcutaneous tract was dilated, followed by placement of a 12 French locking multipurpose drainage catheter. Location of the catheter within the abscess was confirmed with CT. Approximately 15 mL of thick purulent material was aspirated, with a sample sent to the lab for analysis. The catheter was secured to the skin using silk suture. A clean dressing was placed. The drainage catheter was attached to bulb suction. The patient tolerated the procedure well without immediate complication. IMPRESSION: Successful CT-guided placement of a 12 French locking drainage catheter within the liver abscess in the left hepatic lobe. Approximately 15 mL of purulent material was aspirated from the abscess cavity, with a sample sent to the lab for analysis.  Electronically Signed   By: Albin Felling M.D.   On: 08/07/2021 16:20   [place] EKG SITE RITE  Result Date: 07/29/2021 If Site Rite image not attached, placement could not be confirmed due to current cardiac rhythm.  US Abdomen Limited RUQ (LIVER/GB)  Result Date: 08/04/2021 CLINICAL DATA:  Liver abscesses. EXAM: ULTRASOUND ABDOMEN LIMITED RIGHT UPPER QUADRANT COMPARISON:  MRCP, dated July 30, 2021 FINDINGS: Gallbladder: A 2.4 cm gallstone is seen within the dependent portion of the gallbladder lumen. The gallbladder wall measures 7.2 mm in thickness.  No sonographic Murphy sign noted by sonographer. Common bile duct: Diameter: 5.8 mm Liver: A 0.9 cm x 0.6 cm x 1.0 cm complex hypoechoic lesion is seen within the medial aspect of the left lobe of the liver. Additional, similar appearing 2.1 cm x 1.6 cm x 1.4 cm and 0.8 cm x 0.7 cm x 0.7 cm lesions are seen within the lateral aspect of the left lobe. Within normal limits in parenchymal echogenicity. Nonocclusive thrombus is seen within the left portal vein and main portal vein on color Doppler imaging with normal direction of blood flow towards the liver. This is seen on the prior MRI. Other: Limited study secondary to limited ability for the patient to cooperate during the exam. A small amount of ascites is seen within the right upper quadrant and right lower quadrant. IMPRESSION: 1. Multiple complex liver lesions consistent with the patient's history of multiple liver abscesses. 2. Ascites with subsequent gallbladder wall thickening. 3. Nonobstructive thrombus within the left portal vein and main portal vein which is seen on prior MRI. Electronically Signed   By: Virgina Norfolk M.D.   On: 08/04/2021 19:55    Microbiology: Recent Results (from the past 240 hour(s))  Urine Culture     Status: None   Collection Time: 08/04/21  3:03 PM   Specimen: Urine, Clean Catch  Result Value Ref Range Status   Specimen Description URINE, CLEAN CATCH  Final    Special Requests NONE  Final   Culture   Final    NO GROWTH Performed at [facility], [address], [place], [place] [address]    Report Status 08/05/2021 FINAL  Final  Aerobic/Anaerobic Culture w Gram Stain (surgical/deep wound)     Status: None   Collection Time: 08/07/21  2:55 PM   Specimen: Wound  Result Value Ref Range Status   Specimen Description WOUND  Final   Special Requests  ABCESS  Final   Gram Stain   Final    NO SQUAMOUS EPITHELIAL CELLS SEEN ABUNDANT WBC SEEN NO ORGANISMS SEEN    Culture   Final    FEW ENTEROCOCCUS FAECALIS NO ANAEROBES ISOLATED Performed at [facility], [place] [address] [place], [place] [address]    Report Status 08/12/2021 FINAL  Final   Organism ID, Bacteria ENTEROCOCCUS FAECALIS  Final      Susceptibility   Enterococcus faecalis - MIC*    AMPICILLIN <=2 SENSITIVE Sensitive     VANCOMYCIN <=0.5 SENSITIVE Sensitive     GENTAMICIN SYNERGY SENSITIVE Sensitive     * FEW ENTEROCOCCUS FAECALIS     Labs: Basic Metabolic Panel: Recent Labs  Lab 08/09/21 0318 08/09/21 2000 08/10/21 0401 08/11/21 0358 08/12/21 0414 08/13/21 0517 08/22/2021 0434  NA 135 134* 134* 134*  --  136 136  K 4.0 3.8 3.9 3.9  --  3.8 3.9  CL 99 97* 98 99  --  97* 98  CO2 32 _0 --  32 33*  GLUCOSE 92 144* 104* 122*  --  198* 152*  BUN 27* _1 --  17 15  CREATININE 0.54* 0.56* 0.47* 0.54*  --  0.49* 0.47*  CALCIUM 7.8* 8.0* 7.8* 7.9*  --  7.9* 7.9*  MG 1.9  --  1.8 1.7 1.8 1.6*  --   PHOS 3.6  --  4.3 4.0 4.1 3.7  --    Liver Function Tests: Recent Labs  Lab 08/11/21 0358 08/16/2021 0434  AST 25 31  ALT 17 16  ALKPHOS 111 109  BILITOT 0.4 0.5  PROT 5.3* 5.9*  ALBUMIN <1.5* 1.7*   No results for input(s): LIPASE, AMYLASE in the last 168 hours. No results for input(s): AMMONIA in the last 168 hours. CBC: Recent Labs  Lab 08/08/21 0454 08/09/21 0318 08/12/21 0414 08/13/21 0517 08/15/2021 0434  WBC 8.4 8.6 8.7 8.2 8.4  HGB  8.9* 9.0* 9.3* 9.4* 9.8*  HCT 28.1* 28.0* 29.9* 30.1* 31.0*  MCV 98.3 96.9 99.0 98.7 97.8  PLT 211 206 214 210 227   Cardiac Enzymes: No results for input(s): CKTOTAL, CKMB, CKMBINDEX, TROPONINI in the last 168 hours. BNP: BNP (last 3 results) No results for input(s): BNP in the last 8760 hours.  ProBNP (last 3 results) No results for input(s): PROBNP in the last 8760 hours.  CBG: Recent Labs  Lab 08/13/21 1703 08/13/21 1944 08/27/2021 0148 08/20/2021 0749 08/12/2021 1153  GLUCAP 214* 198* 210* 210* 305*       Signed:  Domenic Polite MD.  [facility] 08/21/2021, 1:25 PM

## 2021-08-14 NOTE — Progress Notes (Signed)
ANTICOAGULATION CONSULT NOTE  Pharmacy Consult for Heparin Indication: Portal Vein Thrombosis   No Known Allergies  Patient Measurements: Height: 5\' 7"  (170.2 cm) Weight: 86.1 kg (189 lb 13.1 oz) IBW/kg (Calculated) : 66.1 Heparin Dosing Weight: 73 kg   Vital Signs: Temp: 99.2 F (37.3 C) (12/08 1151) Temp Source: Axillary (12/08 1151) BP: 145/62 (12/08 1151) Pulse Rate: 102 (12/08 1151)  Labs: Recent Labs    08/12/21 0414 08/13/21 0517 09/02/2021 0434 08/31/2021 1224  HGB 9.3* 9.4* 9.8*  --   HCT 29.9* 30.1* 31.0*  --   PLT 214 210 227  --   HEPARINUNFRC 0.58 0.44 <0.10* 0.54  CREATININE  --  0.49* 0.47*  --      Estimated Creatinine Clearance: 87.5 mL/min (A) (by C-G formula based on SCr of 0.47 mg/dL (L)).   Assessment: 70 yo male presented on 07/25/2021 after being found down with AMS found to have perforated liver abscess s/p partial omentectomy and drain placement. MRI with suspected portal vein thrombosis. Results of MRI dicussed with CCM and surgery.   Heparin level therapeutic at 0.54 on infusion at 2450 units/hr. No bleeding or IV issues reported.    Goal of Therapy:  Heparin level 0.3-0.7 units/ml Monitor platelets by anticoagulation protocol: Yes   Plan:  Continue heparin infusion at 2450 units/hr  Daily heparin level and CBC ordered Follow up long term anticoagulation plan   Thank you for allowing [place] to participate in this patients care. Jens Som, PharmD 09/01/2021 2:52 PM  **Pharmacist phone directory can be found on [email or web address] listed under [place]**

## 2021-08-14 NOTE — Progress Notes (Signed)
Inpatient Diabetes Program Recommendations  AACE/ADA: New Consensus Statement on Inpatient Glycemic Control   Target Ranges:  Prepandial:   less than 140 mg/dL      Peak postprandial:   less than 180 mg/dL (1-2 hours)      Critically ill patients:  140 - 180 mg/dL    Latest Reference Range & Units 09/02/2021 01:48 08/21/2021 07:49 08/28/2021 11:53  Glucose-Capillary 70 - 99 mg/dL 210 (H) 210 (H) 305 (H)    Latest Reference Range & Units 08/13/21 08:00 08/13/21 12:35 08/13/21 17:03 08/13/21 19:44  Glucose-Capillary 70 - 99 mg/dL 219 (H) 179 (H) 214 (H) 198 (H)   Review of Glycemic Control  Current orders for Inpatient glycemic control: Novolog 0-15 units Q4H; Osmolite @ 60 ml/hr  Inpatient Diabetes Program Recommendations:    Insulin:  Please consider ordering Novolog 3 units Q4H for tube feeding coverage. If tube feeding is stopped or held then Novolog tube feeding coverage should also be stopped or held.  Thanks, Barnie Alderman, RN, MSN, CDE Diabetes Coordinator Inpatient Diabetes Program [phone] (Team Pager from 8am to 5pm)

## 2021-08-14 NOTE — Progress Notes (Signed)
Pt has yellow MEWS due to HR and RR. Mds are aware.

## 2021-08-15 DIAGNOSIS — E119 Type 2 diabetes mellitus without complications: Secondary | ICD-10-CM | POA: Diagnosis not present

## 2021-08-15 DIAGNOSIS — J9601 Acute respiratory failure with hypoxia: Secondary | ICD-10-CM | POA: Diagnosis not present

## 2021-08-15 DIAGNOSIS — I10 Essential (primary) hypertension: Secondary | ICD-10-CM | POA: Diagnosis not present

## 2021-08-15 DIAGNOSIS — E46 Unspecified protein-calorie malnutrition: Secondary | ICD-10-CM | POA: Diagnosis not present

## 2021-08-15 LAB — COMPREHENSIVE METABOLIC PANEL
ALT: 15 U/L (ref 0–44)
AST: 49 U/L — ABNORMAL HIGH (ref 15–41)
Albumin: 1.7 g/dL — ABNORMAL LOW (ref 3.5–5.0)
Alkaline Phosphatase: 105 U/L (ref 38–126)
Anion gap: 8 (ref 5–15)
BUN: 12 mg/dL (ref 8–23)
CO2: 33 mmol/L — ABNORMAL HIGH (ref 22–32)
Calcium: 7.8 mg/dL — ABNORMAL LOW (ref 8.9–10.3)
Chloride: 97 mmol/L — ABNORMAL LOW (ref 98–111)
Creatinine, Ser: 0.61 mg/dL (ref 0.61–1.24)
GFR, Estimated: >60 mL/min (ref >60)
Glucose, Bld: 218 mg/dL — ABNORMAL HIGH (ref 70–99)
Potassium: 4.6 mmol/L (ref 3.5–5.1)
Sodium: 138 mmol/L (ref 135–145)
Total Bilirubin: 1.4 mg/dL — ABNORMAL HIGH (ref 0.3–1.2)
Total Protein: 5.9 g/dL — ABNORMAL LOW (ref 6.5–8.1)

## 2021-08-15 LAB — CBC WITH DIFFERENTIAL/PLATELET
Abs Immature Granulocytes: 0.17 10*3/uL — ABNORMAL HIGH (ref 0.00–0.07)
Basophils Absolute: 0.1 10*3/uL (ref 0.0–0.1)
Basophils Relative: 1 %
Eosinophils Absolute: 0.2 10*3/uL (ref 0.0–0.5)
Eosinophils Relative: 2 %
HCT: 33 % — ABNORMAL LOW (ref 39.0–52.0)
Hemoglobin: 10.1 g/dL — ABNORMAL LOW (ref 13.0–17.0)
Immature Granulocytes: 2 %
Lymphocytes Relative: 17 %
Lymphs Abs: 1.4 10*3/uL (ref 0.7–4.0)
MCH: 30.3 pg (ref 26.0–34.0)
MCHC: 30.6 g/dL (ref 30.0–36.0)
MCV: 99.1 fL (ref 80.0–100.0)
Monocytes Absolute: 0.5 10*3/uL (ref 0.1–1.0)
Monocytes Relative: 5 %
Neutro Abs: 6.1 10*3/uL (ref 1.7–7.7)
Neutrophils Relative %: 73 %
Platelets: 162 10*3/uL (ref 150–400)
RBC: 3.33 MIL/uL — ABNORMAL LOW (ref 4.22–5.81)
RDW: 16.1 % — ABNORMAL HIGH (ref 11.5–15.5)
WBC: 8.3 10*3/uL (ref 4.0–10.5)
nRBC: 0 % (ref 0.0–0.2)

## 2021-08-15 LAB — HEPARIN LEVEL (UNFRACTIONATED)
Heparin Unfractionated: 0.32 IU/mL (ref 0.30–0.70)
Heparin Unfractionated: 0.41 IU/mL (ref 0.30–0.70)
Heparin Unfractionated: 0.38 IU/mL (ref 0.30–0.70)

## 2021-08-16 DIAGNOSIS — J9601 Acute respiratory failure with hypoxia: Secondary | ICD-10-CM | POA: Diagnosis not present

## 2021-08-16 DIAGNOSIS — I10 Essential (primary) hypertension: Secondary | ICD-10-CM | POA: Diagnosis not present

## 2021-08-16 DIAGNOSIS — J449 Chronic obstructive pulmonary disease, unspecified: Secondary | ICD-10-CM | POA: Diagnosis not present

## 2021-08-16 DIAGNOSIS — E119 Type 2 diabetes mellitus without complications: Secondary | ICD-10-CM | POA: Diagnosis not present

## 2021-08-16 LAB — BASIC METABOLIC PANEL
Anion gap: 7 (ref 5–15)
BUN: 9 mg/dL (ref 8–23)
CO2: 35 mmol/L — ABNORMAL HIGH (ref 22–32)
Calcium: 8.1 mg/dL — ABNORMAL LOW (ref 8.9–10.3)
Chloride: 97 mmol/L — ABNORMAL LOW (ref 98–111)
Creatinine, Ser: 0.62 mg/dL (ref 0.61–1.24)
GFR, Estimated: >60 mL/min (ref >60)
Glucose, Bld: 141 mg/dL — ABNORMAL HIGH (ref 70–99)
Potassium: 3.9 mmol/L (ref 3.5–5.1)
Sodium: 139 mmol/L (ref 135–145)

## 2021-08-16 LAB — HEMOGLOBIN A1C
Hgb A1c MFr Bld: 8.3 % — ABNORMAL HIGH (ref 4.8–5.6)
Mean Plasma Glucose: 191.51 mg/dL

## 2021-08-16 LAB — CBC
HCT: 34 % — ABNORMAL LOW (ref 39.0–52.0)
Hemoglobin: 10.7 g/dL — ABNORMAL LOW (ref 13.0–17.0)
MCH: 31.4 pg (ref 26.0–34.0)
MCHC: 31.5 g/dL (ref 30.0–36.0)
MCV: 99.7 fL (ref 80.0–100.0)
Platelets: 240 10*3/uL (ref 150–400)
RBC: 3.41 MIL/uL — ABNORMAL LOW (ref 4.22–5.81)
RDW: 16 % — ABNORMAL HIGH (ref 11.5–15.5)
WBC: 8.6 10*3/uL (ref 4.0–10.5)
nRBC: 0 % (ref 0.0–0.2)

## 2021-08-16 LAB — HEPARIN LEVEL (UNFRACTIONATED)
Heparin Unfractionated: 0.32 IU/mL (ref 0.30–0.70)
Heparin Unfractionated: 0.31 IU/mL (ref 0.30–0.70)
Heparin Unfractionated: 0.35 IU/mL (ref 0.30–0.70)

## 2021-08-16 LAB — MAGNESIUM: Magnesium: 1.8 mg/dL (ref 1.7–2.4)

## 2021-08-17 ENCOUNTER — Other Ambulatory Visit (HOSPITAL_COMMUNITY): Payer: Self-pay

## 2021-08-17 DIAGNOSIS — Z4682 Encounter for fitting and adjustment of non-vascular catheter: Secondary | ICD-10-CM | POA: Diagnosis not present

## 2021-08-17 DIAGNOSIS — R140 Abdominal distension (gaseous): Secondary | ICD-10-CM | POA: Diagnosis not present

## 2021-08-17 DIAGNOSIS — I10 Essential (primary) hypertension: Secondary | ICD-10-CM | POA: Diagnosis not present

## 2021-08-17 DIAGNOSIS — E119 Type 2 diabetes mellitus without complications: Secondary | ICD-10-CM | POA: Diagnosis not present

## 2021-08-17 DIAGNOSIS — J449 Chronic obstructive pulmonary disease, unspecified: Secondary | ICD-10-CM | POA: Diagnosis not present

## 2021-08-17 DIAGNOSIS — J9601 Acute respiratory failure with hypoxia: Secondary | ICD-10-CM | POA: Diagnosis not present

## 2021-08-17 LAB — C DIFFICILE (CDIFF) QUICK SCRN (NO PCR REFLEX)
C Diff antigen: NEGATIVE
C Diff interpretation: NOT DETECTED
C Diff toxin: NEGATIVE

## 2021-08-17 LAB — CBC
HCT: 32.1 % — ABNORMAL LOW (ref 39.0–52.0)
Hemoglobin: 9.9 g/dL — ABNORMAL LOW (ref 13.0–17.0)
MCH: 31 pg (ref 26.0–34.0)
MCHC: 30.8 g/dL (ref 30.0–36.0)
MCV: 100.6 fL — ABNORMAL HIGH (ref 80.0–100.0)
Platelets: 251 10*3/uL (ref 150–400)
RBC: 3.19 MIL/uL — ABNORMAL LOW (ref 4.22–5.81)
RDW: 16.3 % — ABNORMAL HIGH (ref 11.5–15.5)
WBC: 8.5 10*3/uL (ref 4.0–10.5)
nRBC: 0 % (ref 0.0–0.2)

## 2021-08-17 LAB — PROTIME-INR
INR: 1.1 (ref 0.8–1.2)
Prothrombin Time: 14.6 seconds (ref 11.4–15.2)

## 2021-08-17 LAB — HEPARIN LEVEL (UNFRACTIONATED): Heparin Unfractionated: 0.41 IU/mL (ref 0.30–0.70)

## 2021-08-18 ENCOUNTER — Ambulatory Visit: Payer: Medicare HMO | Admitting: Family

## 2021-08-18 DIAGNOSIS — R197 Diarrhea, unspecified: Secondary | ICD-10-CM | POA: Diagnosis not present

## 2021-08-18 DIAGNOSIS — I81 Portal vein thrombosis: Secondary | ICD-10-CM | POA: Diagnosis not present

## 2021-08-18 DIAGNOSIS — K750 Abscess of liver: Secondary | ICD-10-CM | POA: Diagnosis not present

## 2021-08-18 DIAGNOSIS — J9621 Acute and chronic respiratory failure with hypoxia: Secondary | ICD-10-CM | POA: Diagnosis not present

## 2021-08-18 LAB — BLOOD GAS, ARTERIAL
Acid-Base Excess: 11.7 mmol/L — ABNORMAL HIGH (ref 0.0–2.0)
Acid-Base Excess: 10.9 mmol/L — ABNORMAL HIGH (ref 0.0–2.0)
Bicarbonate: 38.4 mmol/L — ABNORMAL HIGH (ref 20.0–28.0)
Bicarbonate: 36.9 mmol/L — ABNORMAL HIGH (ref 20.0–28.0)
FIO2: 70
O2 Saturation: 92.1 %
O2 Saturation: 87.4 %
Patient temperature: 37.4
Patient temperature: 37.2
pCO2 arterial: 83.2 mmHg (ref 32.0–48.0)
pCO2 arterial: 70.7 mmHg (ref 32.0–48.0)
pH, Arterial: 7.289 — ABNORMAL LOW (ref 7.350–7.450)
pH, Arterial: 7.339 — ABNORMAL LOW (ref 7.350–7.450)
pO2, Arterial: 73.1 mmHg — ABNORMAL LOW (ref 83.0–108.0)
pO2, Arterial: 57.4 mmHg — ABNORMAL LOW (ref 83.0–108.0)

## 2021-08-18 LAB — PROTIME-INR
INR: 1.4 — ABNORMAL HIGH (ref 0.8–1.2)
Prothrombin Time: 17.2 seconds — ABNORMAL HIGH (ref 11.4–15.2)

## 2021-08-18 LAB — HEPARIN LEVEL (UNFRACTIONATED): Heparin Unfractionated: 0.25 IU/mL — ABNORMAL LOW (ref 0.30–0.70)

## 2021-08-19 ENCOUNTER — Other Ambulatory Visit (HOSPITAL_COMMUNITY): Payer: Self-pay

## 2021-08-19 DIAGNOSIS — J90 Pleural effusion, not elsewhere classified: Secondary | ICD-10-CM | POA: Diagnosis not present

## 2021-08-19 DIAGNOSIS — K750 Abscess of liver: Secondary | ICD-10-CM | POA: Diagnosis not present

## 2021-08-19 DIAGNOSIS — J9811 Atelectasis: Secondary | ICD-10-CM | POA: Diagnosis not present

## 2021-08-19 DIAGNOSIS — R188 Other ascites: Secondary | ICD-10-CM | POA: Diagnosis not present

## 2021-08-19 DIAGNOSIS — R197 Diarrhea, unspecified: Secondary | ICD-10-CM | POA: Diagnosis not present

## 2021-08-19 DIAGNOSIS — J9621 Acute and chronic respiratory failure with hypoxia: Secondary | ICD-10-CM | POA: Diagnosis not present

## 2021-08-19 DIAGNOSIS — I81 Portal vein thrombosis: Secondary | ICD-10-CM | POA: Diagnosis not present

## 2021-08-19 LAB — BLOOD GAS, ARTERIAL
Acid-Base Excess: 12.7 mmol/L — ABNORMAL HIGH (ref 0.0–2.0)
Acid-Base Excess: 13 mmol/L — ABNORMAL HIGH (ref 0.0–2.0)
Bicarbonate: 38.2 mmol/L — ABNORMAL HIGH (ref 20.0–28.0)
Bicarbonate: 38.3 mmol/L — ABNORMAL HIGH (ref 20.0–28.0)
FIO2: 80
FIO2: 80
O2 Saturation: 96.2 %
O2 Saturation: 88.9 %
Patient temperature: 37
Patient temperature: 36.5
pCO2 arterial: 65.3 mmHg (ref 32.0–48.0)
pCO2 arterial: 60.2 mmHg — ABNORMAL HIGH (ref 32.0–48.0)
pH, Arterial: 7.385 (ref 7.350–7.450)
pH, Arterial: 7.417 (ref 7.350–7.450)
pO2, Arterial: 80.9 mmHg — ABNORMAL LOW (ref 83.0–108.0)
pO2, Arterial: 53.9 mmHg — ABNORMAL LOW (ref 83.0–108.0)

## 2021-08-19 LAB — CBC
HCT: 35.8 % — ABNORMAL LOW (ref 39.0–52.0)
Hemoglobin: 11.3 g/dL — ABNORMAL LOW (ref 13.0–17.0)
MCH: 31.1 pg (ref 26.0–34.0)
MCHC: 31.6 g/dL (ref 30.0–36.0)
MCV: 98.6 fL (ref 80.0–100.0)
Platelets: 252 10*3/uL (ref 150–400)
RBC: 3.63 MIL/uL — ABNORMAL LOW (ref 4.22–5.81)
RDW: 16.2 % — ABNORMAL HIGH (ref 11.5–15.5)
WBC: 8.9 10*3/uL (ref 4.0–10.5)
nRBC: 0 % (ref 0.0–0.2)

## 2021-08-19 LAB — PROTIME-INR
INR: 1.9 — ABNORMAL HIGH (ref 0.8–1.2)
Prothrombin Time: 21.7 seconds — ABNORMAL HIGH (ref 11.4–15.2)

## 2021-08-19 LAB — HEPARIN LEVEL (UNFRACTIONATED): Heparin Unfractionated: 0.28 IU/mL — ABNORMAL LOW (ref 0.30–0.70)

## 2021-08-19 MED ORDER — IOHEXOL 350 MG/ML SOLN
70.0000 mL | Freq: Once | INTRAVENOUS | Status: AC | PRN
  Administered 2021-08-19: 70 mL via INTRAVENOUS

## 2021-08-20 DIAGNOSIS — R197 Diarrhea, unspecified: Secondary | ICD-10-CM | POA: Diagnosis not present

## 2021-08-20 DIAGNOSIS — I81 Portal vein thrombosis: Secondary | ICD-10-CM | POA: Diagnosis not present

## 2021-08-20 DIAGNOSIS — J9621 Acute and chronic respiratory failure with hypoxia: Secondary | ICD-10-CM | POA: Diagnosis not present

## 2021-08-20 DIAGNOSIS — K750 Abscess of liver: Secondary | ICD-10-CM | POA: Diagnosis not present

## 2021-08-20 LAB — CBC
HCT: 35.1 % — ABNORMAL LOW (ref 39.0–52.0)
Hemoglobin: 10.9 g/dL — ABNORMAL LOW (ref 13.0–17.0)
MCH: 30.4 pg (ref 26.0–34.0)
MCHC: 31.1 g/dL (ref 30.0–36.0)
MCV: 98 fL (ref 80.0–100.0)
Platelets: 272 10*3/uL (ref 150–400)
RBC: 3.58 MIL/uL — ABNORMAL LOW (ref 4.22–5.81)
RDW: 16.1 % — ABNORMAL HIGH (ref 11.5–15.5)
WBC: 9.2 10*3/uL (ref 4.0–10.5)
nRBC: 0 % (ref 0.0–0.2)

## 2021-08-20 LAB — PROTIME-INR
INR: 2.5 — ABNORMAL HIGH (ref 0.8–1.2)
Prothrombin Time: 27.1 seconds — ABNORMAL HIGH (ref 11.4–15.2)

## 2021-08-21 ENCOUNTER — Other Ambulatory Visit (HOSPITAL_COMMUNITY): Payer: Self-pay

## 2021-08-21 DIAGNOSIS — Z4682 Encounter for fitting and adjustment of non-vascular catheter: Secondary | ICD-10-CM | POA: Diagnosis not present

## 2021-08-21 DIAGNOSIS — R140 Abdominal distension (gaseous): Secondary | ICD-10-CM | POA: Diagnosis not present

## 2021-08-21 DIAGNOSIS — K750 Abscess of liver: Secondary | ICD-10-CM | POA: Diagnosis not present

## 2021-08-21 DIAGNOSIS — R197 Diarrhea, unspecified: Secondary | ICD-10-CM | POA: Diagnosis not present

## 2021-08-21 DIAGNOSIS — K7689 Other specified diseases of liver: Secondary | ICD-10-CM | POA: Diagnosis not present

## 2021-08-21 DIAGNOSIS — J9621 Acute and chronic respiratory failure with hypoxia: Secondary | ICD-10-CM | POA: Diagnosis not present

## 2021-08-21 DIAGNOSIS — I81 Portal vein thrombosis: Secondary | ICD-10-CM | POA: Diagnosis not present

## 2021-08-21 DIAGNOSIS — K8020 Calculus of gallbladder without cholecystitis without obstruction: Secondary | ICD-10-CM | POA: Diagnosis not present

## 2021-08-21 LAB — PROTIME-INR
INR: 2.8 — ABNORMAL HIGH (ref 0.8–1.2)
Prothrombin Time: 29.7 s — ABNORMAL HIGH (ref 11.4–15.2)

## 2021-08-21 MED ORDER — IOHEXOL 300 MG/ML  SOLN
100.0000 mL | Freq: Once | INTRAMUSCULAR | Status: AC | PRN
  Administered 2021-08-21: 100 mL via INTRAVENOUS

## 2021-08-22 DIAGNOSIS — I81 Portal vein thrombosis: Secondary | ICD-10-CM | POA: Diagnosis not present

## 2021-08-22 DIAGNOSIS — J9621 Acute and chronic respiratory failure with hypoxia: Secondary | ICD-10-CM | POA: Diagnosis not present

## 2021-08-22 DIAGNOSIS — K750 Abscess of liver: Secondary | ICD-10-CM | POA: Diagnosis not present

## 2021-08-22 DIAGNOSIS — R197 Diarrhea, unspecified: Secondary | ICD-10-CM | POA: Diagnosis not present

## 2021-08-22 LAB — CBC
HCT: 34.1 % — ABNORMAL LOW (ref 39.0–52.0)
Hemoglobin: 10.6 g/dL — ABNORMAL LOW (ref 13.0–17.0)
MCH: 30.9 pg (ref 26.0–34.0)
MCHC: 31.1 g/dL (ref 30.0–36.0)
MCV: 99.4 fL (ref 80.0–100.0)
Platelets: 239 10*3/uL (ref 150–400)
RBC: 3.43 MIL/uL — ABNORMAL LOW (ref 4.22–5.81)
RDW: 16.6 % — ABNORMAL HIGH (ref 11.5–15.5)
WBC: 9.3 10*3/uL (ref 4.0–10.5)
nRBC: 0 % (ref 0.0–0.2)

## 2021-08-22 LAB — PROTIME-INR
INR: 2.3 — ABNORMAL HIGH (ref 0.8–1.2)
Prothrombin Time: 25.5 seconds — ABNORMAL HIGH (ref 11.4–15.2)

## 2021-08-23 LAB — PROTIME-INR
INR: 5.2 (ref 0.8–1.2)
Prothrombin Time: 48.1 seconds — ABNORMAL HIGH (ref 11.4–15.2)

## 2021-08-24 ENCOUNTER — Other Ambulatory Visit (HOSPITAL_COMMUNITY): Payer: Self-pay

## 2021-08-24 DIAGNOSIS — R918 Other nonspecific abnormal finding of lung field: Secondary | ICD-10-CM | POA: Diagnosis not present

## 2021-08-24 LAB — BLOOD GAS, ARTERIAL
Acid-Base Excess: 14.4 mmol/L — ABNORMAL HIGH (ref 0.0–2.0)
Acid-Base Excess: 14.2 mmol/L — ABNORMAL HIGH (ref 0.0–2.0)
Acid-Base Excess: 13.5 mmol/L — ABNORMAL HIGH (ref 0.0–2.0)
Bicarbonate: 40.9 mmol/L — ABNORMAL HIGH (ref 20.0–28.0)
Bicarbonate: 40.4 mmol/L — ABNORMAL HIGH (ref 20.0–28.0)
Bicarbonate: 39.5 mmol/L — ABNORMAL HIGH (ref 20.0–28.0)
Drawn by: 164
FIO2: 80
FIO2: 90
FIO2: 90
O2 Saturation: 93.9 %
O2 Saturation: 94 %
O2 Saturation: 95.1 %
Patient temperature: 38.7
Patient temperature: 37.8
Patient temperature: 37
pCO2 arterial: 88.8 mmHg (ref 32.0–48.0)
pCO2 arterial: 78.5 mmHg (ref 32.0–48.0)
pCO2 arterial: 71.6 mmHg (ref 32.0–48.0)
pH, Arterial: 7.296 — ABNORMAL LOW (ref 7.350–7.450)
pH, Arterial: 7.337 — ABNORMAL LOW (ref 7.350–7.450)
pH, Arterial: 7.361 (ref 7.350–7.450)
pO2, Arterial: 82.3 mmHg — ABNORMAL LOW (ref 83.0–108.0)
pO2, Arterial: 75.9 mmHg — ABNORMAL LOW (ref 83.0–108.0)
pO2, Arterial: 76.1 mmHg — ABNORMAL LOW (ref 83.0–108.0)

## 2021-08-24 LAB — URINALYSIS, ROUTINE W REFLEX MICROSCOPIC
Bilirubin Urine: NEGATIVE
Glucose, UA: NEGATIVE mg/dL
Ketones, ur: NEGATIVE mg/dL
Nitrite: NEGATIVE
Protein, ur: 100 mg/dL — AB
Specific Gravity, Urine: 1.02 (ref 1.005–1.030)
pH: 6 (ref 5.0–8.0)

## 2021-08-24 LAB — PROTIME-INR
INR: 2.3 — ABNORMAL HIGH (ref 0.8–1.2)
Prothrombin Time: 24.9 seconds — ABNORMAL HIGH (ref 11.4–15.2)

## 2021-08-24 LAB — COMPREHENSIVE METABOLIC PANEL
ALT: 13 U/L (ref 0–44)
AST: 21 U/L (ref 15–41)
Albumin: 1.9 g/dL — ABNORMAL LOW (ref 3.5–5.0)
Alkaline Phosphatase: 128 U/L — ABNORMAL HIGH (ref 38–126)
Anion gap: 4 — ABNORMAL LOW (ref 5–15)
BUN: 23 mg/dL (ref 8–23)
CO2: 41 mmol/L — ABNORMAL HIGH (ref 22–32)
Calcium: 8.8 mg/dL — ABNORMAL LOW (ref 8.9–10.3)
Chloride: 94 mmol/L — ABNORMAL LOW (ref 98–111)
Creatinine, Ser: 0.62 mg/dL (ref 0.61–1.24)
GFR, Estimated: >60 mL/min (ref >60)
Glucose, Bld: 151 mg/dL — ABNORMAL HIGH (ref 70–99)
Potassium: 4 mmol/L (ref 3.5–5.1)
Sodium: 139 mmol/L (ref 135–145)
Total Bilirubin: 0.6 mg/dL (ref 0.3–1.2)
Total Protein: 6 g/dL — ABNORMAL LOW (ref 6.5–8.1)

## 2021-08-24 LAB — CBC WITH DIFFERENTIAL/PLATELET
Abs Immature Granulocytes: 0.07 10*3/uL (ref 0.00–0.07)
Basophils Absolute: 0.1 10*3/uL (ref 0.0–0.1)
Basophils Relative: 1 %
Eosinophils Absolute: 0.2 10*3/uL (ref 0.0–0.5)
Eosinophils Relative: 2 %
HCT: 31.7 % — ABNORMAL LOW (ref 39.0–52.0)
Hemoglobin: 10 g/dL — ABNORMAL LOW (ref 13.0–17.0)
Immature Granulocytes: 1 %
Lymphocytes Relative: 16 %
Lymphs Abs: 1.4 10*3/uL (ref 0.7–4.0)
MCH: 31.2 pg (ref 26.0–34.0)
MCHC: 31.5 g/dL (ref 30.0–36.0)
MCV: 98.8 fL (ref 80.0–100.0)
Monocytes Absolute: 0.6 10*3/uL (ref 0.1–1.0)
Monocytes Relative: 6 %
Neutro Abs: 6.7 10*3/uL (ref 1.7–7.7)
Neutrophils Relative %: 74 %
Platelets: 257 10*3/uL (ref 150–400)
RBC: 3.21 MIL/uL — ABNORMAL LOW (ref 4.22–5.81)
RDW: 15.7 % — ABNORMAL HIGH (ref 11.5–15.5)
WBC: 9 10*3/uL (ref 4.0–10.5)
nRBC: 0 % (ref 0.0–0.2)

## 2021-08-24 LAB — URINALYSIS, MICROSCOPIC (REFLEX): RBC / HPF: >50 RBC/hpf (ref 0–5)

## 2021-08-24 LAB — LACTIC ACID, PLASMA: Lactic Acid, Venous: 1.4 mmol/L (ref 0.5–1.9)

## 2021-08-25 LAB — CBC WITH DIFFERENTIAL/PLATELET
Abs Immature Granulocytes: 0.07 10*3/uL (ref 0.00–0.07)
Basophils Absolute: 0.1 10*3/uL (ref 0.0–0.1)
Basophils Relative: 1 %
Eosinophils Absolute: 0.1 10*3/uL (ref 0.0–0.5)
Eosinophils Relative: 1 %
HCT: 34.7 % — ABNORMAL LOW (ref 39.0–52.0)
Hemoglobin: 11.1 g/dL — ABNORMAL LOW (ref 13.0–17.0)
Immature Granulocytes: 1 %
Lymphocytes Relative: 13 %
Lymphs Abs: 1.3 10*3/uL (ref 0.7–4.0)
MCH: 31.4 pg (ref 26.0–34.0)
MCHC: 32 g/dL (ref 30.0–36.0)
MCV: 98 fL (ref 80.0–100.0)
Monocytes Absolute: 0.9 10*3/uL (ref 0.1–1.0)
Monocytes Relative: 9 %
Neutro Abs: 7.8 10*3/uL — ABNORMAL HIGH (ref 1.7–7.7)
Neutrophils Relative %: 75 %
Platelets: 272 10*3/uL (ref 150–400)
RBC: 3.54 MIL/uL — ABNORMAL LOW (ref 4.22–5.81)
RDW: 15.3 % (ref 11.5–15.5)
WBC: 10.3 10*3/uL (ref 4.0–10.5)
nRBC: 0 % (ref 0.0–0.2)

## 2021-08-25 LAB — URINE CULTURE: Culture: NO GROWTH

## 2021-08-25 LAB — VANCOMYCIN, TROUGH: Vancomycin Tr: 12 ug/mL — ABNORMAL LOW (ref 15–20)

## 2021-08-25 LAB — PROTIME-INR
INR: 1.8 — ABNORMAL HIGH (ref 0.8–1.2)
Prothrombin Time: 21.3 seconds — ABNORMAL HIGH (ref 11.4–15.2)

## 2021-08-26 ENCOUNTER — Encounter (HOSPITAL_COMMUNITY): Payer: Self-pay | Admitting: Certified Registered Nurse Anesthetist

## 2021-08-26 ENCOUNTER — Institutional Professional Consult (permissible substitution) (HOSPITAL_COMMUNITY): Payer: Self-pay | Admitting: Certified Registered Nurse Anesthetist

## 2021-08-26 ENCOUNTER — Other Ambulatory Visit (HOSPITAL_COMMUNITY): Payer: Self-pay

## 2021-08-26 DIAGNOSIS — J90 Pleural effusion, not elsewhere classified: Secondary | ICD-10-CM | POA: Diagnosis not present

## 2021-08-26 DIAGNOSIS — J9620 Acute and chronic respiratory failure, unspecified whether with hypoxia or hypercapnia: Secondary | ICD-10-CM | POA: Diagnosis not present

## 2021-08-26 DIAGNOSIS — I10 Essential (primary) hypertension: Secondary | ICD-10-CM | POA: Diagnosis not present

## 2021-08-26 DIAGNOSIS — J449 Chronic obstructive pulmonary disease, unspecified: Secondary | ICD-10-CM | POA: Diagnosis not present

## 2021-08-26 DIAGNOSIS — Z4682 Encounter for fitting and adjustment of non-vascular catheter: Secondary | ICD-10-CM | POA: Diagnosis not present

## 2021-08-26 DIAGNOSIS — J302 Other seasonal allergic rhinitis: Secondary | ICD-10-CM | POA: Diagnosis not present

## 2021-08-26 LAB — BLOOD GAS, ARTERIAL
Acid-Base Excess: 11.5 mmol/L — ABNORMAL HIGH (ref 0.0–2.0)
Bicarbonate: 36.6 mmol/L — ABNORMAL HIGH (ref 20.0–28.0)
FIO2: 80
O2 Saturation: 95.6 %
Patient temperature: 36.1
pCO2 arterial: 56.6 mmHg — ABNORMAL HIGH (ref 32.0–48.0)
pH, Arterial: 7.422 (ref 7.350–7.450)
pO2, Arterial: 73.1 mmHg — ABNORMAL LOW (ref 83.0–108.0)

## 2021-08-26 LAB — PROTIME-INR
INR: 2.4 — ABNORMAL HIGH (ref 0.8–1.2)
Prothrombin Time: 25.9 seconds — ABNORMAL HIGH (ref 11.4–15.2)

## 2021-08-26 MED ORDER — SODIUM CHLORIDE 0.9 % IV SOLN: INTRAVENOUS | Status: DC | PRN

## 2021-08-26 MED ORDER — ROCURONIUM BROMIDE 10 MG/ML (PF) SYRINGE
PREFILLED_SYRINGE | INTRAVENOUS | Status: DC | PRN
  Administered 2021-08-26: 100 mg via INTRAVENOUS

## 2021-08-26 MED ORDER — PROPOFOL 10 MG/ML IV BOLUS
INTRAVENOUS | Status: DC | PRN
  Administered 2021-08-26: 100 mg via INTRAVENOUS

## 2021-08-26 MED ORDER — LIDOCAINE HCL (CARDIAC) PF 100 MG/5ML IV SOSY
PREFILLED_SYRINGE | INTRAVENOUS | Status: DC | PRN
  Administered 2021-08-26: 60 mg via INTRAVENOUS

## 2021-08-26 NOTE — Transfer of Care (Signed)
Immediate Anesthesia Transfer of Care Note  Patient: Matthew Dominguez  Procedure(s) Performed: AN AD HOC INTUBATION  Patient Location: [facility] room 28  Anesthesia Type:General  Level of Consciousness: sedated  Airway & Oxygen Therapy: Patient remains intubated per anesthesia plan and Patient placed on Ventilator (see vital sign flow sheet for setting)  Post-op Assessment: Report given to RN and Post -op Vital signs reviewed and stable  Post vital signs: Reviewed and stable  Last Vitals:  Vitals Value Taken Time  BP    Temp    Pulse 120   Resp    SpO2 94     Last Pain: There were no vitals filed for this visit.       Complications: No notable events documented.

## 2021-08-26 NOTE — Anesthesia Procedure Notes (Signed)
Procedure Name: Intubation Date/Time: 08/26/2021 4:00 PM Performed by: Alain Marion, CRNA Pre-anesthesia Checklist: Patient identified, Emergency Drugs available, Suction available and Patient being monitored Patient Re-evaluated:Patient Re-evaluated prior to induction Oxygen Delivery Method: Circle System Utilized Preoxygenation: Pre-oxygenation with 100% oxygen Induction Type: IV induction Ventilation: Mask ventilation without difficulty Laryngoscope Size: Glidescope and 3 Grade View: Grade I Tube type: Oral Tube size: 8.0 mm Number of attempts: 1 Airway Equipment and Method: Stylet Placement Confirmation: ETT inserted through vocal cords under direct vision, positive ETCO2 and breath sounds checked- equal and bilateral Secured at: 23 cm Tube secured with: Tape Dental Injury: Teeth and Oropharynx as per pre-operative assessment

## 2021-08-27 DIAGNOSIS — E119 Type 2 diabetes mellitus without complications: Secondary | ICD-10-CM | POA: Diagnosis not present

## 2021-08-27 DIAGNOSIS — J449 Chronic obstructive pulmonary disease, unspecified: Secondary | ICD-10-CM

## 2021-08-27 DIAGNOSIS — G894 Chronic pain syndrome: Secondary | ICD-10-CM | POA: Diagnosis not present

## 2021-08-27 DIAGNOSIS — Z978 Presence of other specified devices: Secondary | ICD-10-CM | POA: Diagnosis not present

## 2021-08-27 DIAGNOSIS — A419 Sepsis, unspecified organism: Secondary | ICD-10-CM | POA: Diagnosis not present

## 2021-08-27 DIAGNOSIS — J9601 Acute respiratory failure with hypoxia: Secondary | ICD-10-CM | POA: Diagnosis not present

## 2021-08-27 DIAGNOSIS — J9621 Acute and chronic respiratory failure with hypoxia: Secondary | ICD-10-CM

## 2021-08-27 DIAGNOSIS — R6520 Severe sepsis without septic shock: Secondary | ICD-10-CM

## 2021-08-27 DIAGNOSIS — I10 Essential (primary) hypertension: Secondary | ICD-10-CM | POA: Diagnosis not present

## 2021-08-27 LAB — BASIC METABOLIC PANEL
Anion gap: 6 (ref 5–15)
BUN: 16 mg/dL (ref 8–23)
CO2: 36 mmol/L — ABNORMAL HIGH (ref 22–32)
Calcium: 8.3 mg/dL — ABNORMAL LOW (ref 8.9–10.3)
Chloride: 94 mmol/L — ABNORMAL LOW (ref 98–111)
Creatinine, Ser: 0.54 mg/dL — ABNORMAL LOW (ref 0.61–1.24)
GFR, Estimated: >60 mL/min (ref >60)
Glucose, Bld: 150 mg/dL — ABNORMAL HIGH (ref 70–99)
Potassium: 3.6 mmol/L (ref 3.5–5.1)
Sodium: 136 mmol/L (ref 135–145)

## 2021-08-27 LAB — PROTIME-INR
INR: 3.1 — ABNORMAL HIGH (ref 0.8–1.2)
Prothrombin Time: 31.6 seconds — ABNORMAL HIGH (ref 11.4–15.2)

## 2021-08-27 LAB — TRIGLYCERIDES: Triglycerides: 197 mg/dL — ABNORMAL HIGH (ref <150)
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
## 2021-08-28 DIAGNOSIS — J9811 Atelectasis: Secondary | ICD-10-CM

## 2021-08-28 DIAGNOSIS — I10 Essential (primary) hypertension: Secondary | ICD-10-CM | POA: Diagnosis not present

## 2021-08-28 DIAGNOSIS — A419 Sepsis, unspecified organism: Secondary | ICD-10-CM | POA: Diagnosis not present

## 2021-08-28 DIAGNOSIS — R6520 Severe sepsis without septic shock: Secondary | ICD-10-CM | POA: Diagnosis not present

## 2021-08-28 DIAGNOSIS — J449 Chronic obstructive pulmonary disease, unspecified: Secondary | ICD-10-CM | POA: Diagnosis not present

## 2021-08-28 DIAGNOSIS — E46 Unspecified protein-calorie malnutrition: Secondary | ICD-10-CM | POA: Diagnosis not present

## 2021-08-28 DIAGNOSIS — G894 Chronic pain syndrome: Secondary | ICD-10-CM | POA: Diagnosis not present

## 2021-08-28 DIAGNOSIS — Z978 Presence of other specified devices: Secondary | ICD-10-CM | POA: Diagnosis not present

## 2021-08-28 DIAGNOSIS — E119 Type 2 diabetes mellitus without complications: Secondary | ICD-10-CM | POA: Diagnosis not present

## 2021-08-28 DIAGNOSIS — J9621 Acute and chronic respiratory failure with hypoxia: Secondary | ICD-10-CM | POA: Diagnosis not present

## 2021-08-28 LAB — PROTIME-INR
INR: 2.4 — ABNORMAL HIGH (ref 0.8–1.2)
Prothrombin Time: 26.5 seconds — ABNORMAL HIGH (ref 11.4–15.2)

## 2021-08-28 NOTE — Progress Notes (Signed)
Pulmonary Critical Care Medicine [place]   PULMONARY CRITICAL CARE SERVICE  PROGRESS NOTE     Matthew Dominguez  UDJ:[id number]  DOB: [birth year]   DOA: 08/26/2021  Referring Physician: Satira Sark, MD  HPI: Matthew Dominguez is a 70 y.o. male being followed for ventilator/airway/oxygen weaning Acute on Chronic Respiratory Failure.  Patient remains orally intubated on mechanical ventilation.  Chest x-ray had shown some improvement after intubation still with some loss of volume in the lower lobes  Medications: Reviewed on Rounds  Physical Exam:  Vitals: Temperature is 99.0 pulse 100 respiratory is 20 blood pressure is 134/73 saturations 95%  Ventilator Settings on assist control FiO2 60% tidal volume 450 PEEP 5  General: Comfortable at this time Neck: supple Cardiovascular: no malignant arrhythmias Respiratory: Coarse rhonchi expansion is equal Skin: no rash seen on limited exam Musculoskeletal: No gross abnormality Psychiatric:unable to assess Neurologic:no involuntary movements         Lab Data:   Basic Metabolic Panel: Recent Labs  Lab 08/24/21 0225 08/27/21 0504  NA 139 136  K 4.0 3.6  CL 94* 94*  CO2 41* 36*  GLUCOSE 151* 150*  BUN 23 16  CREATININE 0.62 0.54*  CALCIUM 8.8* 8.3*    ABG: Recent Labs  Lab 08/24/21 0150 08/24/21 0620 08/24/21 0830 08/26/21 1738  PHART 7.296* 7.337* 7.361 7.422  PCO2ART 88.8* 78.5* 71.6* 56.6*  PO2ART 82.3* 75.9* 76.1* 73.1*  HCO3 40.9* 40.4* 39.5* 36.6*  O2SAT 93.9 94.0 95.1 95.6    Liver Function Tests: Recent Labs  Lab 08/24/21 0225  AST 21  ALT 13  ALKPHOS 128*  BILITOT 0.6  PROT 6.0*  ALBUMIN 1.9*   No results for input(s): LIPASE, AMYLASE in the last 168 hours. No results for input(s): AMMONIA in the last 168 hours.  CBC: Recent Labs  Lab 08/22/21 1243 08/24/21 0225 08/25/21 0952  WBC 9.3 9.0 10.3  NEUTROABS  --  6.7 7.8*  HGB 10.6* 10.0* 11.1*  HCT 34.1* 31.7* 34.7*   MCV 99.4 98.8 98.0  PLT 239 257 272    Cardiac Enzymes: No results for input(s): CKTOTAL, CKMB, CKMBINDEX, TROPONINI in the last 168 hours.  BNP (last 3 results) No results for input(s): BNP in the last 8760 hours.  ProBNP (last 3 results) No results for input(s): PROBNP in the last 8760 hours.  Radiological Exams: DG Chest Port 1 View  Result Date: 08/26/2021 CLINICAL DATA:  Endotracheal tube placement. EXAM: PORTABLE CHEST 1 VIEW COMPARISON:  Radiographs 08/24/2021 and 08/19/2021.  CT 08/19/2021. FINDINGS: 1628 hours. Patient is rotated to the left. Tip of the endotracheal tube is in the mid trachea. A feeding tube projects below the diaphragm, tip not visualized. Interval partial re-expansion of the right lung with improved pulmonary aeration. There are persistent right greater than left pleural effusions. No evidence of pneumothorax. There is a percutaneous catheter in the upper abdomen which is incompletely visualized. Patient is status post lower cervical fusion. Telemetry leads overlie the chest. IMPRESSION: Satisfactory placement of the endotracheal tube. Interval partial re-expansion of the right lung with improved pulmonary aeration. Electronically Signed   By: Richardean Sale M.D.   On: 08/26/2021 16:34    Assessment/Plan Active Problems:   COPD, severe (HCC)   Severe sepsis (HCC)   Acute on chronic respiratory failure with hypoxia (HCC)   Chronic pain syndrome   Acute on chronic respiratory failure with hypoxia patient is on mechanical ventilation we will continue with full support patient's oxygen  levels have come down to 60% I think we can titrate down even further Severe sepsis resolved hemodynamics are stable at this time. Chronic pain syndrome pain management we will continue to follow along Severe COPD medical management Endotracheally intubated   I have personally seen and evaluated the patient, evaluated laboratory and imaging results, formulated the assessment  and plan and placed orders. The Patient requires high complexity decision making with multiple systems involvement.  Rounds were done with the Respiratory Therapy Director and Staff therapists and discussed with nursing staff also.  Allyne Gee, MD [facility] Pulmonary Critical Care Medicine Sleep Medicine

## 2021-08-28 NOTE — Procedures (Signed)
Date: 08/28/2021,  MRN# [id number]    Procedure Note: Fiberoptic Bronchoscopy   PROCEDURE DATE: 08/28/2021     NAME:  Matthew Dominguez   DOB:12-05-1950   MRN: [id number] LOC:  5E28C/5E28C-01      Indications/Preliminary Diagnosis: Pulmonary atelectasis  Consent: (Place X beside choice/s below)  The benefits, risks and possible complications of the procedure were        explained to:  ___ patient  __X_ patient's family  ___ other:___________  who verbalized understanding and gave:  ___ verbal  ___ written  ___ verbal and written  ___ telephone  ___ other:________ consent.      Unable to obtain consent; procedure performed on emergent basis.     Other:      PRESEDATION ASSESSMENT: History and Physical has been performed. Patient meds and allergies have been reviewed. Presedation airway examination has been performed and documented. Baseline vital signs, sedation score, oxygenation status, and cardiac rhythm were reviewed. Patient was deemed to be in satisfactory condition to undergo the procedure.  PREMEDICATIONS:   Sedative/Narcotic Amt Dose   Versed  mg   Fentanyl  mcg  Diprivan  mg         Insertion Route (Place X beside choice below)   Nasal   Oral  X Endotracheal Tube   Tracheostomy   INTRAPROCEDURE MEDICATIONS:  Sedative/Narcotic Amt Dose   Versed  mg   Fentanyl  mcg  Diprivan X mg       Medication Amt Dose  Medication Amt Dose  Xylocaine 2%  cc  Epinephrine 1:10,000 sol  cc  Xylocaine 4%  cc  Cocaine  cc   TECHNICAL PROCEDURES: (Place X beside choice below)   Procedures  Description  X  None     Electrocautery     Cryotherapy     Balloon Dilatation     Bronchography     Stent Placement     Therapeutic Aspiration     Laser/Argon Plasma            SPECIMENS (Sites): (Place X beside choice below)  Specimens Description   No Specimens Obtained     Washings   X Lavage    Biopsies    Fine Needle Aspirates    Brushings    Sputum       ESTIMATED BLOOD LOSS: none  COMPLICATIONS/RESOLUTION: none  PROCEDURE DETAILS: Timeout performed and correct patient, name, & ID confirmed. Following prep per Pulmonary policy, appropriate sedation was administered.  Airway exam proceeded with findings, technical procedures, and specimen collection as noted below. At the end of exam the scope was withdrawn without incident. Impression and Plan as noted below.    Procedure Note: After informed consent was obtained patient was prepared in the usual manner patient had been already on propofol drip so the drip was increased to achieve adequate sedation for the procedure itself.  Fiberoptic scope was inserted down through the endotracheal tube and down to the carina.  On the right side patient was noted to have excessive secretions which were suctioned out.  Next a bronchoalveolar lavage was performed in the right middle lobe and also from the right lower lobe.  Patient tolerated the procedure well.  There were no endobronchial lesions the underlying mucosa was noted to be erythematous.  Next the scope was inserted into the left lung left lung revealed no access in secretions there were no endobronchial lesions.    IMPRESSION:POST-PROCEDURE DX: Retained tracheal secretions causing the atelectasis of the lung  RECOMMENDATION/PLAN: Aggressive pulmonary toilet Mucomyst chest PT  I have personally performed the procedure as noted above     Allyne Gee, MD [facility] Pulmonary Critical Care Medicine

## 2021-08-29 ENCOUNTER — Other Ambulatory Visit (HOSPITAL_COMMUNITY): Payer: Self-pay

## 2021-08-29 DIAGNOSIS — G894 Chronic pain syndrome: Secondary | ICD-10-CM | POA: Diagnosis not present

## 2021-08-29 DIAGNOSIS — Z978 Presence of other specified devices: Secondary | ICD-10-CM | POA: Diagnosis not present

## 2021-08-29 DIAGNOSIS — A419 Sepsis, unspecified organism: Secondary | ICD-10-CM | POA: Diagnosis not present

## 2021-08-29 DIAGNOSIS — J9621 Acute and chronic respiratory failure with hypoxia: Secondary | ICD-10-CM | POA: Diagnosis not present

## 2021-08-29 DIAGNOSIS — J449 Chronic obstructive pulmonary disease, unspecified: Secondary | ICD-10-CM | POA: Diagnosis not present

## 2021-08-29 DIAGNOSIS — R6520 Severe sepsis without septic shock: Secondary | ICD-10-CM | POA: Diagnosis not present

## 2021-08-29 DIAGNOSIS — E119 Type 2 diabetes mellitus without complications: Secondary | ICD-10-CM | POA: Diagnosis not present

## 2021-08-29 DIAGNOSIS — J9690 Respiratory failure, unspecified, unspecified whether with hypoxia or hypercapnia: Secondary | ICD-10-CM | POA: Diagnosis not present

## 2021-08-29 DIAGNOSIS — E46 Unspecified protein-calorie malnutrition: Secondary | ICD-10-CM | POA: Diagnosis not present

## 2021-08-29 DIAGNOSIS — I10 Essential (primary) hypertension: Secondary | ICD-10-CM | POA: Diagnosis not present

## 2021-08-29 LAB — CULTURE, BLOOD (ROUTINE X 2)
Culture: NO GROWTH
Culture: NO GROWTH
Special Requests: ADEQUATE

## 2021-08-29 LAB — PROTIME-INR
INR: 1.8 — ABNORMAL HIGH (ref 0.8–1.2)
Prothrombin Time: 20.8 seconds — ABNORMAL HIGH (ref 11.4–15.2)

## 2021-08-29 NOTE — Progress Notes (Signed)
Pulmonary Critical Care Medicine [place]   PULMONARY CRITICAL CARE SERVICE  PROGRESS NOTE     ATIF CHAPPLE  HYI:[id number]  DOB: May 14, 1951   DOA: 08/31/2021  Referring Physician: Satira Sark, MD  HPI: Matthew Dominguez is a 70 y.o. male being followed for ventilator/airway/oxygen weaning Acute on Chronic Respiratory Failure.  Patient is on full support on the ventilator on assist control mode should be trying spontaneous trial today  Medications: Reviewed on Rounds  Physical Exam:  Vitals: Temperature 97.8 pulse 99 respiratory is 23 blood pressure is 130/61 saturations 94%  Ventilator Settings on assist control FiO2 is 50% tidal volume 470 PEEP 5  General: Comfortable at this time Neck: supple Cardiovascular: no malignant arrhythmias Respiratory: Scattered rhonchi expansion equal Skin: no rash seen on limited exam Musculoskeletal: No gross abnormality Psychiatric:unable to assess Neurologic:no involuntary movements         Lab Data:   Basic Metabolic Panel: Recent Labs  Lab 08/24/21 0225 08/27/21 0504  NA 139 136  K 4.0 3.6  CL 94* 94*  CO2 41* 36*  GLUCOSE 151* 150*  BUN 23 16  CREATININE 0.62 0.54*  CALCIUM 8.8* 8.3*    ABG: Recent Labs  Lab 08/24/21 0150 08/24/21 0620 08/24/21 0830 08/26/21 1738  PHART 7.296* 7.337* 7.361 7.422  PCO2ART 88.8* 78.5* 71.6* 56.6*  PO2ART 82.3* 75.9* 76.1* 73.1*  HCO3 40.9* 40.4* 39.5* 36.6*  O2SAT 93.9 94.0 95.1 95.6    Liver Function Tests: Recent Labs  Lab 08/24/21 0225  AST 21  ALT 13  ALKPHOS 128*  BILITOT 0.6  PROT 6.0*  ALBUMIN 1.9*   No results for input(s): LIPASE, AMYLASE in the last 168 hours. No results for input(s): AMMONIA in the last 168 hours.  CBC: Recent Labs  Lab 08/22/21 1243 08/24/21 0225 08/25/21 0952  WBC 9.3 9.0 10.3  NEUTROABS  --  6.7 7.8*  HGB 10.6* 10.0* 11.1*  HCT 34.1* 31.7* 34.7*  MCV 99.4 98.8 98.0  PLT 239 257 272    Cardiac  Enzymes: No results for input(s): CKTOTAL, CKMB, CKMBINDEX, TROPONINI in the last 168 hours.  BNP (last 3 results) No results for input(s): BNP in the last 8760 hours.  ProBNP (last 3 results) No results for input(s): PROBNP in the last 8760 hours.  Radiological Exams: DG Chest Port 1 View  Result Date: 08/29/2021 CLINICAL DATA:  70 year old male with respiratory failure. EXAM: PORTABLE CHEST 1 VIEW COMPARISON:  Portable chest 08/26/2021 and earlier. FINDINGS: Portable AP semi upright view at 0508 hours. Stable lines and tubes. Mildly improved lung volumes. Normal cardiac size and mediastinal contours. Regressed but not fully resolved right lung veiling opacity, retrocardiac opacity. No pneumothorax. Decreased pulmonary vascularity. No confluent opacity in the left lung. Prior ACDF. No acute osseous abnormality identified. IMPRESSION: 1. Stable lines and tubes. 2. Regressed but not resolved right lung probable combined pleural effusion and collapse or consolidation. 3. Decreased pulmonary vascularity but possible mild interstitial edema. Electronically Signed   By: Genevie Ann M.D.   On: 08/29/2021 06:50    Assessment/Plan Active Problems:   COPD, severe (HCC)   Severe sepsis (HCC)   Acute on chronic respiratory failure with hypoxia (HCC)   Chronic pain syndrome   Acute on chronic respiratory failure hypoxia we will try spontaneous breathing trial today Severe COPD medical management nebulizers as needed Severe sepsis treated in resolution Chronic pain syndrome pain management Endotracheally intubated   I have personally seen and evaluated the patient,  evaluated laboratory and imaging results, formulated the assessment and plan and placed orders. The Patient requires high complexity decision making with multiple systems involvement.  Rounds were done with the Respiratory Therapy Director and Staff therapists and discussed with nursing staff also.  Allyne Gee, MD [facility] Pulmonary  Critical Care Medicine Sleep Medicine

## 2021-08-30 DIAGNOSIS — E46 Unspecified protein-calorie malnutrition: Secondary | ICD-10-CM | POA: Diagnosis not present

## 2021-08-30 DIAGNOSIS — E119 Type 2 diabetes mellitus without complications: Secondary | ICD-10-CM | POA: Diagnosis not present

## 2021-08-30 DIAGNOSIS — Z978 Presence of other specified devices: Secondary | ICD-10-CM | POA: Diagnosis not present

## 2021-08-30 DIAGNOSIS — R6520 Severe sepsis without septic shock: Secondary | ICD-10-CM | POA: Diagnosis not present

## 2021-08-30 DIAGNOSIS — J449 Chronic obstructive pulmonary disease, unspecified: Secondary | ICD-10-CM | POA: Diagnosis not present

## 2021-08-30 DIAGNOSIS — A419 Sepsis, unspecified organism: Secondary | ICD-10-CM | POA: Diagnosis not present

## 2021-08-30 DIAGNOSIS — I10 Essential (primary) hypertension: Secondary | ICD-10-CM | POA: Diagnosis not present

## 2021-08-30 DIAGNOSIS — J9621 Acute and chronic respiratory failure with hypoxia: Secondary | ICD-10-CM | POA: Diagnosis not present

## 2021-08-30 DIAGNOSIS — G894 Chronic pain syndrome: Secondary | ICD-10-CM | POA: Diagnosis not present

## 2021-08-30 LAB — CBC
HCT: 26.6 % — ABNORMAL LOW (ref 39.0–52.0)
Hemoglobin: 8.7 g/dL — ABNORMAL LOW (ref 13.0–17.0)
MCH: 31.1 pg (ref 26.0–34.0)
MCHC: 32.7 g/dL (ref 30.0–36.0)
MCV: 95 fL (ref 80.0–100.0)
Platelets: 268 10*3/uL (ref 150–400)
RBC: 2.8 MIL/uL — ABNORMAL LOW (ref 4.22–5.81)
RDW: 15.1 % (ref 11.5–15.5)
WBC: 6 10*3/uL (ref 4.0–10.5)
nRBC: 0 % (ref 0.0–0.2)

## 2021-08-30 LAB — BASIC METABOLIC PANEL
Anion gap: 17 — ABNORMAL HIGH (ref 5–15)
BUN: 12 mg/dL (ref 8–23)
CO2: 31 mmol/L (ref 22–32)
Calcium: 6.6 mg/dL — ABNORMAL LOW (ref 8.9–10.3)
Chloride: 101 mmol/L (ref 98–111)
Creatinine, Ser: 1.68 mg/dL — ABNORMAL HIGH (ref 0.61–1.24)
GFR, Estimated: 43 mL/min — ABNORMAL LOW (ref >60)
Glucose, Bld: 152 mg/dL — ABNORMAL HIGH (ref 70–99)
Potassium: 2.5 mmol/L — CL (ref 3.5–5.1)
Sodium: 149 mmol/L — ABNORMAL HIGH (ref 135–145)

## 2021-08-30 LAB — CULTURE, RESPIRATORY W GRAM STAIN

## 2021-08-30 LAB — TRIGLYCERIDES: Triglycerides: 219 mg/dL — ABNORMAL HIGH (ref <150)

## 2021-08-30 LAB — MAGNESIUM: Magnesium: 1.4 mg/dL — ABNORMAL LOW (ref 1.7–2.4)

## 2021-08-30 LAB — PROTIME-INR
INR: 1.7 — ABNORMAL HIGH (ref 0.8–1.2)
Prothrombin Time: 20.2 seconds — ABNORMAL HIGH (ref 11.4–15.2)

## 2021-08-30 NOTE — Progress Notes (Signed)
Pulmonary Critical Care Medicine [place]   PULMONARY CRITICAL CARE SERVICE  PROGRESS NOTE     Matthew Dominguez  ZRA:[id number]  DOB: 23-Nov-1950   DOA: 08/26/2021  Referring Physician: Satira Sark, MD  HPI: Matthew Dominguez is a 70 y.o. male being followed for ventilator/airway/oxygen weaning Acute on Chronic Respiratory Failure.  Patient is on pressure support mode currently on 40% FiO2 has been on a pressure of 12/5  Medications: Reviewed on Rounds  Physical Exam:  Vitals: Temperature is 98.4 pulse 98 respiratory 22 blood pressure is 131/70 saturations 98%  Ventilator Settings on pressure support FiO2 40% pressure 12/5  General: Comfortable at this time Neck: supple Cardiovascular: no malignant arrhythmias Respiratory: Scattered rhonchi expansion is equal Skin: no rash seen on limited exam Musculoskeletal: No gross abnormality Psychiatric:unable to assess Neurologic:no involuntary movements         Lab Data:   Basic Metabolic Panel: Recent Labs  Lab 08/24/21 0225 08/27/21 0504 08/30/21 0500  NA 139 136 149*  K 4.0 3.6 2.5*  CL 94* 94* 101  CO2 41* 36* 31  GLUCOSE 151* 150* 152*  BUN 23 16 12   CREATININE 0.62 0.54* 1.68*  CALCIUM 8.8* 8.3* 6.6*  MG  --   --  1.4*    ABG: Recent Labs  Lab 08/24/21 0150 08/24/21 0620 08/24/21 0830 08/26/21 1738  PHART 7.296* 7.337* 7.361 7.422  PCO2ART 88.8* 78.5* 71.6* 56.6*  PO2ART 82.3* 75.9* 76.1* 73.1*  HCO3 40.9* 40.4* 39.5* 36.6*  O2SAT 93.9 94.0 95.1 95.6    Liver Function Tests: Recent Labs  Lab 08/24/21 0225  AST 21  ALT 13  ALKPHOS 128*  BILITOT 0.6  PROT 6.0*  ALBUMIN 1.9*   No results for input(s): LIPASE, AMYLASE in the last 168 hours. No results for input(s): AMMONIA in the last 168 hours.  CBC: Recent Labs  Lab 08/24/21 0225 08/25/21 0952 08/30/21 0500  WBC 9.0 10.3 6.0  NEUTROABS 6.7 7.8*  --   HGB 10.0* 11.1* 8.7*  HCT 31.7* 34.7* 26.6*  MCV 98.8 98.0 95.0   PLT 257 272 268    Cardiac Enzymes: No results for input(s): CKTOTAL, CKMB, CKMBINDEX, TROPONINI in the last 168 hours.  BNP (last 3 results) No results for input(s): BNP in the last 8760 hours.  ProBNP (last 3 results) No results for input(s): PROBNP in the last 8760 hours.  Radiological Exams: DG Chest Port 1 View  Result Date: 08/29/2021 CLINICAL DATA:  70 year old male with respiratory failure. EXAM: PORTABLE CHEST 1 VIEW COMPARISON:  Portable chest 08/26/2021 and earlier. FINDINGS: Portable AP semi upright view at 0508 hours. Stable lines and tubes. Mildly improved lung volumes. Normal cardiac size and mediastinal contours. Regressed but not fully resolved right lung veiling opacity, retrocardiac opacity. No pneumothorax. Decreased pulmonary vascularity. No confluent opacity in the left lung. Prior ACDF. No acute osseous abnormality identified. IMPRESSION: 1. Stable lines and tubes. 2. Regressed but not resolved right lung probable combined pleural effusion and collapse or consolidation. 3. Decreased pulmonary vascularity but possible mild interstitial edema. Electronically Signed   By: Genevie Ann M.D.   On: 08/29/2021 06:50    Assessment/Plan Active Problems:   COPD, severe (HCC)   Severe sepsis (HCC)   Acute on chronic respiratory failure with hypoxia (HCC)   Chronic pain syndrome   Acute on chronic respiratory failure with hypoxia we will continue to wean as tolerated patient has been doing fine with the 12/5 Severe COPD medical management continue  with present therapy Severe sepsis resolved hemodynamics have been stable Chronic pain syndrome pain management    I have personally seen and evaluated the patient, evaluated laboratory and imaging results, formulated the assessment and plan and placed orders. The Patient requires high complexity decision making with multiple systems involvement.  Rounds were done with the Respiratory Therapy Director and Staff therapists and  discussed with nursing staff also.  Allyne Gee, MD [facility] Pulmonary Critical Care Medicine Sleep Medicine

## 2021-08-31 ENCOUNTER — Other Ambulatory Visit (HOSPITAL_COMMUNITY): Payer: Self-pay

## 2021-08-31 DIAGNOSIS — I81 Portal vein thrombosis: Secondary | ICD-10-CM | POA: Diagnosis not present

## 2021-08-31 DIAGNOSIS — R6520 Severe sepsis without septic shock: Secondary | ICD-10-CM | POA: Diagnosis not present

## 2021-08-31 DIAGNOSIS — Z978 Presence of other specified devices: Secondary | ICD-10-CM | POA: Diagnosis not present

## 2021-08-31 DIAGNOSIS — J9621 Acute and chronic respiratory failure with hypoxia: Secondary | ICD-10-CM | POA: Diagnosis not present

## 2021-08-31 DIAGNOSIS — A419 Sepsis, unspecified organism: Secondary | ICD-10-CM | POA: Diagnosis not present

## 2021-08-31 DIAGNOSIS — Z4682 Encounter for fitting and adjustment of non-vascular catheter: Secondary | ICD-10-CM | POA: Diagnosis not present

## 2021-08-31 DIAGNOSIS — E119 Type 2 diabetes mellitus without complications: Secondary | ICD-10-CM | POA: Diagnosis not present

## 2021-08-31 DIAGNOSIS — J449 Chronic obstructive pulmonary disease, unspecified: Secondary | ICD-10-CM | POA: Diagnosis not present

## 2021-08-31 DIAGNOSIS — I10 Essential (primary) hypertension: Secondary | ICD-10-CM | POA: Diagnosis not present

## 2021-08-31 DIAGNOSIS — G894 Chronic pain syndrome: Secondary | ICD-10-CM | POA: Diagnosis not present

## 2021-08-31 LAB — BASIC METABOLIC PANEL
Anion gap: 5 (ref 5–15)
BUN: 11 mg/dL (ref 8–23)
CO2: 32 mmol/L (ref 22–32)
Calcium: 8.6 mg/dL — ABNORMAL LOW (ref 8.9–10.3)
Chloride: 100 mmol/L (ref 98–111)
Creatinine, Ser: 0.5 mg/dL — ABNORMAL LOW (ref 0.61–1.24)
GFR, Estimated: >60 mL/min (ref >60)
Glucose, Bld: 180 mg/dL — ABNORMAL HIGH (ref 70–99)
Potassium: 4.6 mmol/L (ref 3.5–5.1)
Sodium: 137 mmol/L (ref 135–145)

## 2021-08-31 LAB — PROTIME-INR
INR: 1.3 — ABNORMAL HIGH (ref 0.8–1.2)
Prothrombin Time: 16.1 seconds — ABNORMAL HIGH (ref 11.4–15.2)

## 2021-08-31 LAB — MAGNESIUM: Magnesium: 2.3 mg/dL (ref 1.7–2.4)

## 2021-08-31 NOTE — Progress Notes (Signed)
Pulmonary Critical Care Medicine [place]   PULMONARY CRITICAL CARE SERVICE  PROGRESS NOTE     Matthew Dominguez  SWN:[id number]  DOB: 01-02-1951   DOA: 08/10/2021  Referring Physician: Satira Sark, MD  HPI: Matthew Dominguez is a 70 y.o. male being followed for ventilator/airway/oxygen weaning Acute on Chronic Respiratory Failure.  Patient is on pressure support has been on 50% FiO2 with good saturations  Medications: Reviewed on Rounds  Physical Exam:  Vitals: Temperature is 98.1 pulse 98 respiratory 27 blood pressure is 152/76 saturations 95%  Ventilator Settings on pressure support FiO2 50% pressure 12/5  General: Comfortable at this time Neck: supple Cardiovascular: no malignant arrhythmias Respiratory: Scattered rhonchi expansion is equal Skin: no rash seen on limited exam Musculoskeletal: No gross abnormality Psychiatric:unable to assess Neurologic:no involuntary movements         Lab Data:   Basic Metabolic Panel: Recent Labs  Lab 08/27/21 0504 08/30/21 0500  NA 136 149*  K 3.6 2.5*  CL 94* 101  CO2 36* 31  GLUCOSE 150* 152*  BUN 16 12  CREATININE 0.54* 1.68*  CALCIUM 8.3* 6.6*  MG  --  1.4*    ABG: Recent Labs  Lab 08/26/21 1738  PHART 7.422  PCO2ART 56.6*  PO2ART 73.1*  HCO3 36.6*  O2SAT 95.6    Liver Function Tests: No results for input(s): AST, ALT, ALKPHOS, BILITOT, PROT, ALBUMIN in the last 168 hours. No results for input(s): LIPASE, AMYLASE in the last 168 hours. No results for input(s): AMMONIA in the last 168 hours.  CBC: Recent Labs  Lab 08/25/21 0952 08/30/21 0500  WBC 10.3 6.0  NEUTROABS 7.8*  --   HGB 11.1* 8.7*  HCT 34.7* 26.6*  MCV 98.0 95.0  PLT 272 268    Cardiac Enzymes: No results for input(s): CKTOTAL, CKMB, CKMBINDEX, TROPONINI in the last 168 hours.  BNP (last 3 results) No results for input(s): BNP in the last 8760 hours.  ProBNP (last 3 results) No results for input(s): PROBNP in  the last 8760 hours.  Radiological Exams: No results found.  Assessment/Plan Active Problems:   COPD, severe (HCC)   Severe sepsis (HCC)   Acute on chronic respiratory failure with hypoxia (HCC)   Chronic pain syndrome   Acute on chronic respiratory failure with hypoxia plan is going to be to wean on the pressure support goal of 8 hours Severe COPD medical management Severe sepsis treated in resolution Chronic pain syndrome pain management Endotracheally intubated supportive care   I have personally seen and evaluated the patient, evaluated laboratory and imaging results, formulated the assessment and plan and placed orders. The Patient requires high complexity decision making with multiple systems involvement.  Rounds were done with the Respiratory Therapy Director and Staff therapists and discussed with nursing staff also.  Allyne Gee, MD [facility] Pulmonary Critical Care Medicine Sleep Medicine

## 2021-09-01 ENCOUNTER — Other Ambulatory Visit (HOSPITAL_COMMUNITY): Payer: Self-pay

## 2021-09-01 DIAGNOSIS — R6520 Severe sepsis without septic shock: Secondary | ICD-10-CM | POA: Diagnosis not present

## 2021-09-01 DIAGNOSIS — E119 Type 2 diabetes mellitus without complications: Secondary | ICD-10-CM | POA: Diagnosis not present

## 2021-09-01 DIAGNOSIS — J449 Chronic obstructive pulmonary disease, unspecified: Secondary | ICD-10-CM | POA: Diagnosis not present

## 2021-09-01 DIAGNOSIS — J9621 Acute and chronic respiratory failure with hypoxia: Secondary | ICD-10-CM | POA: Diagnosis not present

## 2021-09-01 DIAGNOSIS — E46 Unspecified protein-calorie malnutrition: Secondary | ICD-10-CM | POA: Diagnosis not present

## 2021-09-01 DIAGNOSIS — I10 Essential (primary) hypertension: Secondary | ICD-10-CM | POA: Diagnosis not present

## 2021-09-01 DIAGNOSIS — J90 Pleural effusion, not elsewhere classified: Secondary | ICD-10-CM | POA: Diagnosis not present

## 2021-09-01 DIAGNOSIS — G894 Chronic pain syndrome: Secondary | ICD-10-CM | POA: Diagnosis not present

## 2021-09-01 DIAGNOSIS — A419 Sepsis, unspecified organism: Secondary | ICD-10-CM | POA: Diagnosis not present

## 2021-09-01 DIAGNOSIS — Z978 Presence of other specified devices: Secondary | ICD-10-CM | POA: Diagnosis not present

## 2021-09-01 LAB — PROTIME-INR
INR: 1.3 — ABNORMAL HIGH (ref 0.8–1.2)
Prothrombin Time: 16.2 seconds — ABNORMAL HIGH (ref 11.4–15.2)

## 2021-09-01 NOTE — Progress Notes (Signed)
Pulmonary Critical Care Medicine [place]   PULMONARY CRITICAL CARE SERVICE  PROGRESS NOTE     Matthew Dominguez  YIR:[id number]  DOB: 07-04-1951   DOA: 09/01/2021  Referring Physician: Satira Sark, MD  HPI: Matthew Dominguez is a 70 y.o. male being followed for ventilator/airway/oxygen weaning Acute on Chronic Respiratory Failure.  Patient is on pressure support mode supposed to do 12-hour goal  Medications: Reviewed on Rounds  Physical Exam:  Vitals: Temperature is 97.9 pulse 103 respiratory 24 blood pressure is 166/74 saturations 94%  Ventilator Settings pressure support FiO2 40% pressure 12/5  General: Comfortable at this time Neck: supple Cardiovascular: no malignant arrhythmias Respiratory: Coarse rhonchi expansion is equal Skin: no rash seen on limited exam Musculoskeletal: No gross abnormality Psychiatric:unable to assess Neurologic:no involuntary movements         Lab Data:   Basic Metabolic Panel: Recent Labs  Lab 08/27/21 0504 08/30/21 0500 08/31/21 0813 08/31/21 1123  NA 136 149* 137  --   K 3.6 2.5* 4.6  --   CL 94* 101 100  --   CO2 36* 31 32  --   GLUCOSE 150* 152* 180*  --   BUN 16 12 11   --   CREATININE 0.54* 1.68* 0.50*  --   CALCIUM 8.3* 6.6* 8.6*  --   MG  --  1.4*  --  2.3    ABG: Recent Labs  Lab 08/26/21 1738  PHART 7.422  PCO2ART 56.6*  PO2ART 73.1*  HCO3 36.6*  O2SAT 95.6    Liver Function Tests: No results for input(s): AST, ALT, ALKPHOS, BILITOT, PROT, ALBUMIN in the last 168 hours. No results for input(s): LIPASE, AMYLASE in the last 168 hours. No results for input(s): AMMONIA in the last 168 hours.  CBC: Recent Labs  Lab 08/30/21 0500  WBC 6.0  HGB 8.7*  HCT 26.6*  MCV 95.0  PLT 268    Cardiac Enzymes: No results for input(s): CKTOTAL, CKMB, CKMBINDEX, TROPONINI in the last 168 hours.  BNP (last 3 results) No results for input(s): BNP in the last 8760 hours.  ProBNP (last 3  results) No results for input(s): PROBNP in the last 8760 hours.  Radiological Exams: DG Chest Port 1 View  Result Date: 09/01/2021 CLINICAL DATA:  ETT, pleural effusion EXAM: PORTABLE CHEST 1 VIEW COMPARISON:  08/29/2021 FINDINGS: Endotracheal tube terminates 3 cm above the carina. Enteric tube courses into the stomach. Small layering bilateral pleural effusions, right greater than left. Mild platelike scarring/atelectasis in the left mid lung. No pneumothorax. The heart is normal in size. IMPRESSION: Endotracheal tube terminates 2 cm above the carina. Additional support apparatus as above. Small layering bilateral pleural effusions, right greater than left. Electronically Signed   By: Julian Hy M.D.   On: 09/01/2021 05:37   DG Abd Portable 1V  Result Date: 09/01/2021 CLINICAL DATA:  NG placement. EXAM: PORTABLE ABDOMEN - 1 VIEW COMPARISON:  Abdominal radiograph dated 08/19/2021 and CT dated 08/21/2021. FINDINGS: Enteric tube with tip in the proximal stomach. Endotracheal tube with tip approximately 4 cm above the carina. Partially visualized pigtail drainage catheter over the epigastric area. Bilateral streaky pulmonary densities. IMPRESSION: Enteric tube with tip in the proximal stomach. Electronically Signed   By: Anner Crete M.D.   On: 09/01/2021 00:03    Assessment/Plan Active Problems:   COPD, severe (HCC)   Severe sepsis (HCC)   Acute on chronic respiratory failure with hypoxia (HCC)   Chronic pain syndrome  Acute on chronic respiratory failure hypoxia plan is going to be to continue to wean on pressure support as tolerated we will continue with secretion management pulmonary toilet. Severe COPD medical management Chronic pain syndrome pain management Severe sepsis resolved hemodynamics are stable Endotracheally intubated but we are trying to wean the patient   I have personally seen and evaluated the patient, evaluated laboratory and imaging results, formulated the  assessment and plan and placed orders. The Patient requires high complexity decision making with multiple systems involvement.  Rounds were done with the Respiratory Therapy Director and Staff therapists and discussed with nursing staff also.  Allyne Gee, MD [facility] Pulmonary Critical Care Medicine Sleep Medicine

## 2021-09-02 ENCOUNTER — Encounter: Payer: Self-pay | Admitting: Internal Medicine

## 2021-09-02 DIAGNOSIS — A419 Sepsis, unspecified organism: Secondary | ICD-10-CM | POA: Diagnosis not present

## 2021-09-02 DIAGNOSIS — I10 Essential (primary) hypertension: Secondary | ICD-10-CM | POA: Diagnosis not present

## 2021-09-02 DIAGNOSIS — J449 Chronic obstructive pulmonary disease, unspecified: Secondary | ICD-10-CM | POA: Diagnosis not present

## 2021-09-02 DIAGNOSIS — E119 Type 2 diabetes mellitus without complications: Secondary | ICD-10-CM | POA: Diagnosis not present

## 2021-09-02 DIAGNOSIS — J9621 Acute and chronic respiratory failure with hypoxia: Secondary | ICD-10-CM | POA: Diagnosis not present

## 2021-09-02 DIAGNOSIS — G894 Chronic pain syndrome: Secondary | ICD-10-CM | POA: Diagnosis not present

## 2021-09-02 DIAGNOSIS — Z978 Presence of other specified devices: Secondary | ICD-10-CM | POA: Diagnosis not present

## 2021-09-02 DIAGNOSIS — R6520 Severe sepsis without septic shock: Secondary | ICD-10-CM | POA: Diagnosis not present

## 2021-09-02 LAB — CBC
HCT: 31.1 % — ABNORMAL LOW (ref 39.0–52.0)
Hemoglobin: 10.1 g/dL — ABNORMAL LOW (ref 13.0–17.0)
MCH: 31 pg (ref 26.0–34.0)
MCHC: 32.5 g/dL (ref 30.0–36.0)
MCV: 95.4 fL (ref 80.0–100.0)
Platelets: 351 10*3/uL (ref 150–400)
RBC: 3.26 MIL/uL — ABNORMAL LOW (ref 4.22–5.81)
RDW: 15.4 % (ref 11.5–15.5)
WBC: 7.3 10*3/uL (ref 4.0–10.5)
nRBC: 0 % (ref 0.0–0.2)

## 2021-09-02 LAB — BASIC METABOLIC PANEL
Anion gap: 5 (ref 5–15)
BUN: 10 mg/dL (ref 8–23)
CO2: 31 mmol/L (ref 22–32)
Calcium: 8.5 mg/dL — ABNORMAL LOW (ref 8.9–10.3)
Chloride: 101 mmol/L (ref 98–111)
Creatinine, Ser: 0.45 mg/dL — ABNORMAL LOW (ref 0.61–1.24)
GFR, Estimated: >60 mL/min (ref >60)
Glucose, Bld: 193 mg/dL — ABNORMAL HIGH (ref 70–99)
Potassium: 4.3 mmol/L (ref 3.5–5.1)
Sodium: 137 mmol/L (ref 135–145)

## 2021-09-02 LAB — MAGNESIUM: Magnesium: 1.8 mg/dL (ref 1.7–2.4)

## 2021-09-02 LAB — PROTIME-INR
INR: 1.4 — ABNORMAL HIGH (ref 0.8–1.2)
Prothrombin Time: 16.9 seconds — ABNORMAL HIGH (ref 11.4–15.2)

## 2021-09-02 LAB — TRIGLYCERIDES: Triglycerides: 283 mg/dL — ABNORMAL HIGH (ref <150)

## 2021-09-02 NOTE — Progress Notes (Signed)
Pulmonary Critical Care Medicine [place]   PULMONARY CRITICAL CARE SERVICE  PROGRESS NOTE     Matthew Dominguez  OEU:[id number]  DOB: 1951/04/29   DOA: 08/30/2021  Referring Physician: Satira Sark, MD  HPI: Matthew Dominguez is a 70 y.o. male being followed for ventilator/airway/oxygen weaning Acute on Chronic Respiratory Failure.  Patient is on pressure support currently on 50% FiO2 with a pressure of 12/5  Medications: Reviewed on Rounds  Physical Exam:  Vitals: Temperature is 98.8 pulse 104 respiratory is 24 blood pressure is 165/76 saturations 100%  Ventilator Settings on pressure support FiO2 50% pressure 12/5 tidal line 436  General: Comfortable at this time Neck: supple Cardiovascular: no malignant arrhythmias Respiratory: No rhonchi no rales are noted at this time Skin: no rash seen on limited exam Musculoskeletal: No gross abnormality Psychiatric:unable to assess Neurologic:no involuntary movements         Lab Data:   Basic Metabolic Panel: Recent Labs  Lab 08/27/21 0504 08/30/21 0500 08/31/21 0813 08/31/21 1123 09/02/21 0506  NA 136 149* 137  --  137  K 3.6 2.5* 4.6  --  4.3  CL 94* 101 100  --  101  CO2 36* 31 32  --  31  GLUCOSE 150* 152* 180*  --  193*  BUN 16 12 11   --  10  CREATININE 0.54* 1.68* 0.50*  --  0.45*  CALCIUM 8.3* 6.6* 8.6*  --  8.5*  MG  --  1.4*  --  2.3 1.8    ABG: Recent Labs  Lab 08/26/21 1738  PHART 7.422  PCO2ART 56.6*  PO2ART 73.1*  HCO3 36.6*  O2SAT 95.6    Liver Function Tests: No results for input(s): AST, ALT, ALKPHOS, BILITOT, PROT, ALBUMIN in the last 168 hours. No results for input(s): LIPASE, AMYLASE in the last 168 hours. No results for input(s): AMMONIA in the last 168 hours.  CBC: Recent Labs  Lab 08/30/21 0500 09/02/21 0506  WBC 6.0 7.3  HGB 8.7* 10.1*  HCT 26.6* 31.1*  MCV 95.0 95.4  PLT 268 351    Cardiac Enzymes: No results for input(s): CKTOTAL, CKMB, CKMBINDEX,  TROPONINI in the last 168 hours.  BNP (last 3 results) No results for input(s): BNP in the last 8760 hours.  ProBNP (last 3 results) No results for input(s): PROBNP in the last 8760 hours.  Radiological Exams: DG Chest Port 1 View  Result Date: 09/01/2021 CLINICAL DATA:  ETT, pleural effusion EXAM: PORTABLE CHEST 1 VIEW COMPARISON:  08/29/2021 FINDINGS: Endotracheal tube terminates 3 cm above the carina. Enteric tube courses into the stomach. Small layering bilateral pleural effusions, right greater than left. Mild platelike scarring/atelectasis in the left mid lung. No pneumothorax. The heart is normal in size. IMPRESSION: Endotracheal tube terminates 2 cm above the carina. Additional support apparatus as above. Small layering bilateral pleural effusions, right greater than left. Electronically Signed   By: Julian Hy M.D.   On: 09/01/2021 05:37   DG Abd Portable 1V  Result Date: 09/01/2021 CLINICAL DATA:  NG placement. EXAM: PORTABLE ABDOMEN - 1 VIEW COMPARISON:  Abdominal radiograph dated 08/08/2021 and CT dated 08/21/2021. FINDINGS: Enteric tube with tip in the proximal stomach. Endotracheal tube with tip approximately 4 cm above the carina. Partially visualized pigtail drainage catheter over the epigastric area. Bilateral streaky pulmonary densities. IMPRESSION: Enteric tube with tip in the proximal stomach. Electronically Signed   By: Anner Crete M.D.   On: 09/01/2021 00:03  Assessment/Plan Active Problems:   COPD, severe (HCC)   Severe sepsis (HCC)   Acute on chronic respiratory failure with hypoxia (HCC)   Chronic pain syndrome   Acute on chronic respiratory failure hypoxia plan is to continue with pressure support The patient is actually tolerating it fairly well Severe COPD medical management we will continue to follow along closely. Severe sepsis resolved hemodynamics are stable Chronic pain syndrome pain management Endotracheally intubated   I have  personally seen and evaluated the patient, evaluated laboratory and imaging results, formulated the assessment and plan and placed orders. The Patient requires high complexity decision making with multiple systems involvement.  Rounds were done with the Respiratory Therapy Director and Staff therapists and discussed with nursing staff also.  Allyne Gee, MD [facility] Pulmonary Critical Care Medicine Sleep Medicine

## 2021-09-03 ENCOUNTER — Ambulatory Visit: Payer: Medicare HMO | Admitting: Internal Medicine

## 2021-09-03 DIAGNOSIS — G894 Chronic pain syndrome: Secondary | ICD-10-CM | POA: Diagnosis not present

## 2021-09-03 DIAGNOSIS — R6520 Severe sepsis without septic shock: Secondary | ICD-10-CM | POA: Diagnosis not present

## 2021-09-03 DIAGNOSIS — E119 Type 2 diabetes mellitus without complications: Secondary | ICD-10-CM | POA: Diagnosis not present

## 2021-09-03 DIAGNOSIS — I10 Essential (primary) hypertension: Secondary | ICD-10-CM | POA: Diagnosis not present

## 2021-09-03 DIAGNOSIS — Z978 Presence of other specified devices: Secondary | ICD-10-CM | POA: Diagnosis not present

## 2021-09-03 DIAGNOSIS — J449 Chronic obstructive pulmonary disease, unspecified: Secondary | ICD-10-CM | POA: Diagnosis not present

## 2021-09-03 DIAGNOSIS — A419 Sepsis, unspecified organism: Secondary | ICD-10-CM | POA: Diagnosis not present

## 2021-09-03 DIAGNOSIS — J9621 Acute and chronic respiratory failure with hypoxia: Secondary | ICD-10-CM | POA: Diagnosis not present

## 2021-09-03 LAB — PROTIME-INR
INR: 1.4 — ABNORMAL HIGH (ref 0.8–1.2)
Prothrombin Time: 17.1 seconds — ABNORMAL HIGH (ref 11.4–15.2)

## 2021-09-03 NOTE — Progress Notes (Signed)
Pulmonary Critical Care Medicine [place]   PULMONARY CRITICAL CARE SERVICE  PROGRESS NOTE     Matthew Dominguez  PRF:[id number]  DOB: 1950-11-11   DOA: 08/16/2021  Referring Physician: Satira Sark, MD  HPI: Matthew Dominguez is a 70 y.o. male being followed for ventilator/airway/oxygen weaning Acute on Chronic Respiratory Failure.  Patient is on pressure support has been on 45% FiO2 the goal today is for 20 hours  Medications: Reviewed on Rounds  Physical Exam:  Vitals: Temperature is 98.0 pulse 94 respiratory rate is 19 blood pressure is 133/67 saturations 96%  Ventilator Settings pressure support FiO2 is 45% pressure 12/5  General: Comfortable at this time Neck: supple Cardiovascular: no malignant arrhythmias Respiratory: Scattered rhonchi expansion is equal Skin: no rash seen on limited exam Musculoskeletal: No gross abnormality Psychiatric:unable to assess Neurologic:no involuntary movements         Lab Data:   Basic Metabolic Panel: Recent Labs  Lab 08/30/21 0500 08/31/21 0813 08/31/21 1123 09/02/21 0506  NA 149* 137  --  137  K 2.5* 4.6  --  4.3  CL 101 100  --  101  CO2 31 32  --  31  GLUCOSE 152* 180*  --  193*  BUN 12 11  --  10  CREATININE 1.68* 0.50*  --  0.45*  CALCIUM 6.6* 8.6*  --  8.5*  MG 1.4*  --  2.3 1.8    ABG: No results for input(s): PHART, PCO2ART, PO2ART, HCO3, O2SAT in the last 168 hours.  Liver Function Tests: No results for input(s): AST, ALT, ALKPHOS, BILITOT, PROT, ALBUMIN in the last 168 hours. No results for input(s): LIPASE, AMYLASE in the last 168 hours. No results for input(s): AMMONIA in the last 168 hours.  CBC: Recent Labs  Lab 08/30/21 0500 09/02/21 0506  WBC 6.0 7.3  HGB 8.7* 10.1*  HCT 26.6* 31.1*  MCV 95.0 95.4  PLT 268 351    Cardiac Enzymes: No results for input(s): CKTOTAL, CKMB, CKMBINDEX, TROPONINI in the last 168 hours.  BNP (last 3 results) No results for input(s): BNP in  the last 8760 hours.  ProBNP (last 3 results) No results for input(s): PROBNP in the last 8760 hours.  Radiological Exams: No results found.  Assessment/Plan Active Problems:   COPD, severe (HCC)   Severe sepsis (HCC)   Acute on chronic respiratory failure with hypoxia (HCC)   Chronic pain syndrome   Acute on chronic respiratory failure hypoxia plan is to continue to wean for 20 hours on the pressure support. Severe sepsis resolved hemodynamics are stable at this time. Severe COPD medical management we will continue to follow along closely. Chronic pain syndrome pain management    I have personally seen and evaluated the patient, evaluated laboratory and imaging results, formulated the assessment and plan and placed orders. The Patient requires high complexity decision making with multiple systems involvement.  Rounds were done with the Respiratory Therapy Director and Staff therapists and discussed with nursing staff also.  Allyne Gee, MD [facility] Pulmonary Critical Care Medicine Sleep Medicine

## 2021-09-04 DIAGNOSIS — J449 Chronic obstructive pulmonary disease, unspecified: Secondary | ICD-10-CM | POA: Diagnosis not present

## 2021-09-04 DIAGNOSIS — A419 Sepsis, unspecified organism: Secondary | ICD-10-CM | POA: Diagnosis not present

## 2021-09-04 DIAGNOSIS — R6520 Severe sepsis without septic shock: Secondary | ICD-10-CM | POA: Diagnosis not present

## 2021-09-04 DIAGNOSIS — Z978 Presence of other specified devices: Secondary | ICD-10-CM | POA: Diagnosis not present

## 2021-09-04 DIAGNOSIS — G894 Chronic pain syndrome: Secondary | ICD-10-CM | POA: Diagnosis not present

## 2021-09-04 DIAGNOSIS — J9621 Acute and chronic respiratory failure with hypoxia: Secondary | ICD-10-CM | POA: Diagnosis not present

## 2021-09-04 LAB — CBC
HCT: 33.1 % — ABNORMAL LOW (ref 39.0–52.0)
Hemoglobin: 10.4 g/dL — ABNORMAL LOW (ref 13.0–17.0)
MCH: 30.1 pg (ref 26.0–34.0)
MCHC: 31.4 g/dL (ref 30.0–36.0)
MCV: 95.7 fL (ref 80.0–100.0)
Platelets: 330 10*3/uL (ref 150–400)
RBC: 3.46 MIL/uL — ABNORMAL LOW (ref 4.22–5.81)
RDW: 15.2 % (ref 11.5–15.5)
WBC: 7.4 10*3/uL (ref 4.0–10.5)
nRBC: 0 % (ref 0.0–0.2)

## 2021-09-04 LAB — BASIC METABOLIC PANEL
Anion gap: 7 (ref 5–15)
BUN: 16 mg/dL (ref 8–23)
CO2: 32 mmol/L (ref 22–32)
Calcium: 8.8 mg/dL — ABNORMAL LOW (ref 8.9–10.3)
Chloride: 95 mmol/L — ABNORMAL LOW (ref 98–111)
Creatinine, Ser: 0.41 mg/dL — ABNORMAL LOW (ref 0.61–1.24)
GFR, Estimated: >60 mL/min (ref >60)
Glucose, Bld: 176 mg/dL — ABNORMAL HIGH (ref 70–99)
Potassium: 4.3 mmol/L (ref 3.5–5.1)
Sodium: 134 mmol/L — ABNORMAL LOW (ref 135–145)

## 2021-09-04 LAB — MAGNESIUM: Magnesium: 1.8 mg/dL (ref 1.7–2.4)

## 2021-09-04 LAB — PROTIME-INR
INR: 1.5 — ABNORMAL HIGH (ref 0.8–1.2)
Prothrombin Time: 18.5 seconds — ABNORMAL HIGH (ref 11.4–15.2)

## 2021-09-04 NOTE — Progress Notes (Signed)
Pulmonary Critical Care Medicine [place]   PULMONARY CRITICAL CARE SERVICE  PROGRESS NOTE     Matthew Dominguez  [id number]  DOB: [birth year]   DOA: 09/04/2021  Referring Physician: Satira Sark, MD  HPI: Matthew Dominguez is a 70 y.o. male being followed for ventilator/airway/oxygen weaning Acute on Chronic Respiratory Failure.  Patient is on pressure support has been on 40% FiO2 goal today is for 24 hours did 20 hours yesterday  Medications: Reviewed on Rounds  Physical Exam:  Vitals: Temperature 96.8 pulse 96 respiratory 23 blood pressure 152/80 saturations 97%  Ventilator Settings on pressure support 12/5 FiO2 40%  General: Comfortable at this time Neck: supple Cardiovascular: no malignant arrhythmias Respiratory: Scattered rhonchi expansion is equal Skin: no rash seen on limited exam Musculoskeletal: No gross abnormality Psychiatric:unable to assess Neurologic:no involuntary movements         Lab Data:   Basic Metabolic Panel: Recent Labs  Lab 08/30/21 0500 08/31/21 0813 08/31/21 1123 09/02/21 0506 09/04/21 0555  NA 149* 137  --  137 134*  K 2.5* 4.6  --  4.3 4.3  CL 101 100  --  101 95*  CO2 31 32  --  31 32  GLUCOSE 152* 180*  --  193* 176*  BUN 12 11  --  10 16  CREATININE 1.68* 0.50*  --  0.45* 0.41*  CALCIUM 6.6* 8.6*  --  8.5* 8.8*  MG 1.4*  --  2.3 1.8 1.8    ABG: No results for input(s): PHART, PCO2ART, PO2ART, HCO3, O2SAT in the last 168 hours.  Liver Function Tests: No results for input(s): AST, ALT, ALKPHOS, BILITOT, PROT, ALBUMIN in the last 168 hours. No results for input(s): LIPASE, AMYLASE in the last 168 hours. No results for input(s): AMMONIA in the last 168 hours.  CBC: Recent Labs  Lab 08/30/21 0500 09/02/21 0506 09/04/21 0555  WBC 6.0 7.3 7.4  HGB 8.7* 10.1* 10.4*  HCT 26.6* 31.1* 33.1*  MCV 95.0 95.4 95.7  PLT 268 351 330    Cardiac Enzymes: No results for input(s): CKTOTAL, CKMB, CKMBINDEX,  TROPONINI in the last 168 hours.  BNP (last 3 results) No results for input(s): BNP in the last 8760 hours.  ProBNP (last 3 results) No results for input(s): PROBNP in the last 8760 hours.  Radiological Exams: No results found.  Assessment/Plan Active Problems:   COPD, severe (HCC)   Severe sepsis (HCC)   Acute on chronic respiratory failure with hypoxia (HCC)   Chronic pain syndrome   Acute on chronic respiratory failure with hypoxia we will continue with the wean protocol goal of 24 hours on pressure support will look at the possibility of extubation once the patient has completed the second goal Severe sepsis resolved hemodynamics are stable Severe COPD medical management Chronic pain syndrome pain management we will continue to monitor along closely Endotracheally intubated   I have personally seen and evaluated the patient, evaluated laboratory and imaging results, formulated the assessment and plan and placed orders. The Patient requires high complexity decision making with multiple systems involvement.  Rounds were done with the Respiratory Therapy Director and Staff therapists and discussed with nursing staff also.  Allyne Gee, MD [facility] Pulmonary Critical Care Medicine Sleep Medicine

## 2021-09-05 DIAGNOSIS — R6520 Severe sepsis without septic shock: Secondary | ICD-10-CM | POA: Diagnosis not present

## 2021-09-05 DIAGNOSIS — A419 Sepsis, unspecified organism: Secondary | ICD-10-CM | POA: Diagnosis not present

## 2021-09-05 DIAGNOSIS — G894 Chronic pain syndrome: Secondary | ICD-10-CM | POA: Diagnosis not present

## 2021-09-05 DIAGNOSIS — Z978 Presence of other specified devices: Secondary | ICD-10-CM | POA: Diagnosis not present

## 2021-09-05 DIAGNOSIS — J9621 Acute and chronic respiratory failure with hypoxia: Secondary | ICD-10-CM | POA: Diagnosis not present

## 2021-09-05 DIAGNOSIS — J449 Chronic obstructive pulmonary disease, unspecified: Secondary | ICD-10-CM | POA: Diagnosis not present

## 2021-09-05 LAB — BLOOD GAS, ARTERIAL
Acid-Base Excess: 8.8 mmol/L — ABNORMAL HIGH (ref 0.0–2.0)
Bicarbonate: 33.9 mmol/L — ABNORMAL HIGH (ref 20.0–28.0)
FIO2: 45
O2 Saturation: 95.5 %
Patient temperature: 37
pCO2 arterial: 57.9 mmHg — ABNORMAL HIGH (ref 32.0–48.0)
pH, Arterial: 7.386 (ref 7.350–7.450)
pO2, Arterial: 79.4 mmHg — ABNORMAL LOW (ref 83.0–108.0)

## 2021-09-05 LAB — BASIC METABOLIC PANEL
Anion gap: 6 (ref 5–15)
BUN: 15 mg/dL (ref 8–23)
CO2: 32 mmol/L (ref 22–32)
Calcium: 9 mg/dL (ref 8.9–10.3)
Chloride: 98 mmol/L (ref 98–111)
Creatinine, Ser: 0.43 mg/dL — ABNORMAL LOW (ref 0.61–1.24)
GFR, Estimated: >60 mL/min (ref >60)
Glucose, Bld: 196 mg/dL — ABNORMAL HIGH (ref 70–99)
Potassium: 4.4 mmol/L (ref 3.5–5.1)
Sodium: 136 mmol/L (ref 135–145)

## 2021-09-05 LAB — PROTIME-INR
INR: 1.7 — ABNORMAL HIGH (ref 0.8–1.2)
Prothrombin Time: 20.1 seconds — ABNORMAL HIGH (ref 11.4–15.2)

## 2021-09-05 LAB — TRIGLYCERIDES: Triglycerides: 289 mg/dL — ABNORMAL HIGH (ref <150)

## 2021-09-05 NOTE — Progress Notes (Signed)
Pulmonary Critical Care Medicine [place]   PULMONARY CRITICAL CARE SERVICE  PROGRESS NOTE     Matthew Dominguez  UEA:[id number]  DOB: Jan 03, 1951   DOA: 08/13/2021  Referring Physician: Satira Sark, MD  HPI: Matthew Dominguez is a 70 y.o. male being followed for ventilator/airway/oxygen weaning Acute on Chronic Respiratory Failure.  Patient currently is on pressure support mode has been on 50% FiO2  Medications: Reviewed on Rounds  Physical Exam:  Vitals: Temperature is 98.0 pulse 89 respiratory is 20 blood pressure is 157/71 saturations 99%  Ventilator Settings on pressure support FiO2 50% pressure 12/5  General: Comfortable at this time Neck: supple Cardiovascular: no malignant arrhythmias Respiratory: Scattered rhonchi very coarse breath sounds Skin: no rash seen on limited exam Musculoskeletal: No gross abnormality Psychiatric:unable to assess Neurologic:no involuntary movements         Lab Data:   Basic Metabolic Panel: Recent Labs  Lab 08/30/21 0500 08/31/21 0813 08/31/21 1123 09/02/21 0506 09/04/21 0555 09/05/21 0312  NA 149* 137  --  137 134* 136  K 2.5* 4.6  --  4.3 4.3 4.4  CL 101 100  --  101 95* 98  CO2 31 32  --  31 32 32  GLUCOSE 152* 180*  --  193* 176* 196*  BUN 12 11  --  10 16 15   CREATININE 1.68* 0.50*  --  0.45* 0.41* 0.43*  CALCIUM 6.6* 8.6*  --  8.5* 8.8* 9.0  MG 1.4*  --  2.3 1.8 1.8  --     ABG: No results for input(s): PHART, PCO2ART, PO2ART, HCO3, O2SAT in the last 168 hours.  Liver Function Tests: No results for input(s): AST, ALT, ALKPHOS, BILITOT, PROT, ALBUMIN in the last 168 hours. No results for input(s): LIPASE, AMYLASE in the last 168 hours. No results for input(s): AMMONIA in the last 168 hours.  CBC: Recent Labs  Lab 08/30/21 0500 09/02/21 0506 09/04/21 0555  WBC 6.0 7.3 7.4  HGB 8.7* 10.1* 10.4*  HCT 26.6* 31.1* 33.1*  MCV 95.0 95.4 95.7  PLT 268 351 330    Cardiac Enzymes: No results  for input(s): CKTOTAL, CKMB, CKMBINDEX, TROPONINI in the last 168 hours.  BNP (last 3 results) No results for input(s): BNP in the last 8760 hours.  ProBNP (last 3 results) No results for input(s): PROBNP in the last 8760 hours.  Radiological Exams: No results found.  Assessment/Plan Active Problems:   COPD, severe (HCC)   Severe sepsis (HCC)   Acute on chronic respiratory failure with hypoxia (HCC)   Chronic pain syndrome   Acute on chronic respiratory failure hypoxia plan is to continue to wean check an ABG today Severe COPD medical management we will continue to follow along closely Severe sepsis treated resolved hemodynamics are stable Chronic pain syndrome pain management Endotracheally intubated   I have personally seen and evaluated the patient, evaluated laboratory and imaging results, formulated the assessment and plan and placed orders. The Patient requires high complexity decision making with multiple systems involvement.  Rounds were done with the Respiratory Therapy Director and Staff therapists and discussed with nursing staff also.  Allyne Gee, MD [facility] Pulmonary Critical Care Medicine Sleep Medicine

## 2021-09-06 LAB — PROTIME-INR
INR: 2 — ABNORMAL HIGH (ref 0.8–1.2)
Prothrombin Time: 22.4 seconds — ABNORMAL HIGH (ref 11.4–15.2)

## 2021-09-07 DIAGNOSIS — J9621 Acute and chronic respiratory failure with hypoxia: Secondary | ICD-10-CM | POA: Diagnosis not present

## 2021-09-07 DIAGNOSIS — G894 Chronic pain syndrome: Secondary | ICD-10-CM | POA: Diagnosis not present

## 2021-09-07 DIAGNOSIS — R6520 Severe sepsis without septic shock: Secondary | ICD-10-CM | POA: Diagnosis not present

## 2021-09-07 DIAGNOSIS — Z978 Presence of other specified devices: Secondary | ICD-10-CM | POA: Diagnosis not present

## 2021-09-07 DIAGNOSIS — A419 Sepsis, unspecified organism: Secondary | ICD-10-CM | POA: Diagnosis not present

## 2021-09-07 DIAGNOSIS — J449 Chronic obstructive pulmonary disease, unspecified: Secondary | ICD-10-CM | POA: Diagnosis not present

## 2021-09-07 LAB — CBC
HCT: 34.8 % — ABNORMAL LOW (ref 39.0–52.0)
Hemoglobin: 11.5 g/dL — ABNORMAL LOW (ref 13.0–17.0)
MCH: 31.3 pg (ref 26.0–34.0)
MCHC: 33 g/dL (ref 30.0–36.0)
MCV: 94.8 fL (ref 80.0–100.0)
Platelets: 284 10*3/uL (ref 150–400)
RBC: 3.67 MIL/uL — ABNORMAL LOW (ref 4.22–5.81)
RDW: 14.6 % (ref 11.5–15.5)
WBC: 8 10*3/uL (ref 4.0–10.5)
nRBC: 0 % (ref 0.0–0.2)

## 2021-09-07 LAB — PROTIME-INR
INR: 2.3 — ABNORMAL HIGH (ref 0.8–1.2)
Prothrombin Time: 24.9 seconds — ABNORMAL HIGH (ref 11.4–15.2)

## 2021-09-07 LAB — COMPREHENSIVE METABOLIC PANEL WITH GFR
ALT: 23 U/L (ref 0–44)
AST: 31 U/L (ref 15–41)
Albumin: 2.5 g/dL — ABNORMAL LOW (ref 3.5–5.0)
Alkaline Phosphatase: 213 U/L — ABNORMAL HIGH (ref 38–126)
Anion gap: 8 (ref 5–15)
BUN: 19 mg/dL (ref 8–23)
CO2: 31 mmol/L (ref 22–32)
Calcium: 9.2 mg/dL (ref 8.9–10.3)
Chloride: 96 mmol/L — ABNORMAL LOW (ref 98–111)
Creatinine, Ser: 0.48 mg/dL — ABNORMAL LOW (ref 0.61–1.24)
GFR, Estimated: >60 mL/min (ref >60)
Glucose, Bld: 287 mg/dL — ABNORMAL HIGH (ref 70–99)
Potassium: 4.4 mmol/L (ref 3.5–5.1)
Sodium: 135 mmol/L (ref 135–145)
Total Bilirubin: 0.5 mg/dL (ref 0.3–1.2)
Total Protein: 6.5 g/dL (ref 6.5–8.1)

## 2021-09-07 NOTE — Progress Notes (Signed)
Pulmonary Critical Care Medicine [place]   PULMONARY CRITICAL CARE SERVICE  PROGRESS NOTE     DELL BRINER  PIR:[id number]  DOB: July 06, 1951   DOA: 09/06/2021  Referring Physician: Satira Sark, MD  HPI: Matthew Dominguez is a 71 y.o. male being followed for ventilator/airway/oxygen weaning Acute on Chronic Respiratory Failure.  Patient is on pressure support mode and is going to be completing 48 hours  Medications: Reviewed on Rounds  Physical Exam:  Vitals: Temperature is 98.2 pulse 100 respiratory 23 blood pressure is 115/67 saturations 95%  Ventilator Settings on pressure support 12/5  General: Comfortable at this time Neck: supple Cardiovascular: no malignant arrhythmias Respiratory: Scattered rhonchi expansion is equal Skin: no rash seen on limited exam Musculoskeletal: No gross abnormality Psychiatric:unable to assess Neurologic:no involuntary movements         Lab Data:   Basic Metabolic Panel: Recent Labs  Lab 08/31/21 1123 09/02/21 0506 09/04/21 0555 09/05/21 0312 09/07/21 0332  NA  --  137 134* 136 135  K  --  4.3 4.3 4.4 4.4  CL  --  101 95* 98 96*  CO2  --  31 32 32 31  GLUCOSE  --  193* 176* 196* 287*  BUN  --  10 16 15 19   CREATININE  --  0.45* 0.41* 0.43* 0.48*  CALCIUM  --  8.5* 8.8* 9.0 9.2  MG 2.3 1.8 1.8  --   --     ABG: Recent Labs  Lab 09/05/21 0935  PHART 7.386  PCO2ART 57.9*  PO2ART 79.4*  HCO3 33.9*  O2SAT 95.5    Liver Function Tests: Recent Labs  Lab 09/07/21 0332  AST 31  ALT 23  ALKPHOS 213*  BILITOT 0.5  PROT 6.5  ALBUMIN 2.5*   No results for input(s): LIPASE, AMYLASE in the last 168 hours. No results for input(s): AMMONIA in the last 168 hours.  CBC: Recent Labs  Lab 09/02/21 0506 09/04/21 0555 09/07/21 0332  WBC 7.3 7.4 8.0  HGB 10.1* 10.4* 11.5*  HCT 31.1* 33.1* 34.8*  MCV 95.4 95.7 94.8  PLT 351 330 284    Cardiac Enzymes: No results for input(s): CKTOTAL, CKMB,  CKMBINDEX, TROPONINI in the last 168 hours.  BNP (last 3 results) No results for input(s): BNP in the last 8760 hours.  ProBNP (last 3 results) No results for input(s): PROBNP in the last 8760 hours.  Radiological Exams: No results found.  Assessment/Plan Active Problems:   COPD, severe (HCC)   Severe sepsis (HCC)   Acute on chronic respiratory failure with hypoxia (HCC)   Chronic pain syndrome   Acute on chronic respiratory failure hypoxia patient will be completing 24 hours on the pressure support wean plan will be to continue with the pressure support consider a trial of extubation in the coming week Severe COPD medical management we will continue to monitor along closely. Severe sepsis treated resolved hemodynamics are stable Chronic pain syndrome pain management pain appears to be under control Endotracheally intubated no change we will continue to follow along closely   I have personally seen and evaluated the patient, evaluated laboratory and imaging results, formulated the assessment and plan and placed orders. The Patient requires high complexity decision making with multiple systems involvement.  Rounds were done with the Respiratory Therapy Director and Staff therapists and discussed with nursing staff also.  Allyne Gee, MD [facility] Pulmonary Critical Care Medicine Sleep Medicine

## 2021-09-07 DEATH — deceased

## 2021-09-08 DIAGNOSIS — A419 Sepsis, unspecified organism: Secondary | ICD-10-CM | POA: Diagnosis not present

## 2021-09-08 DIAGNOSIS — Z978 Presence of other specified devices: Secondary | ICD-10-CM | POA: Diagnosis not present

## 2021-09-08 DIAGNOSIS — J449 Chronic obstructive pulmonary disease, unspecified: Secondary | ICD-10-CM | POA: Diagnosis not present

## 2021-09-08 DIAGNOSIS — R6520 Severe sepsis without septic shock: Secondary | ICD-10-CM | POA: Diagnosis not present

## 2021-09-08 DIAGNOSIS — J9621 Acute and chronic respiratory failure with hypoxia: Secondary | ICD-10-CM | POA: Diagnosis not present

## 2021-09-08 DIAGNOSIS — G894 Chronic pain syndrome: Secondary | ICD-10-CM | POA: Diagnosis not present

## 2021-09-08 LAB — BASIC METABOLIC PANEL
Anion gap: 5 (ref 5–15)
BUN: 17 mg/dL (ref 8–23)
CO2: 31 mmol/L (ref 22–32)
Calcium: 9.2 mg/dL (ref 8.9–10.3)
Chloride: 99 mmol/L (ref 98–111)
Creatinine, Ser: 0.47 mg/dL — ABNORMAL LOW (ref 0.61–1.24)
GFR, Estimated: >60 mL/min (ref >60)
Glucose, Bld: 180 mg/dL — ABNORMAL HIGH (ref 70–99)
Potassium: 4.1 mmol/L (ref 3.5–5.1)
Sodium: 135 mmol/L (ref 135–145)

## 2021-09-08 LAB — BLOOD GAS, ARTERIAL
Acid-Base Excess: 6.7 mmol/L — ABNORMAL HIGH (ref 0.0–2.0)
Bicarbonate: 31.5 mmol/L — ABNORMAL HIGH (ref 20.0–28.0)
FIO2: 50
O2 Saturation: 95.9 %
Patient temperature: 36.8
pCO2 arterial: 51.8 mmHg — ABNORMAL HIGH (ref 32.0–48.0)
pH, Arterial: 7.399 (ref 7.350–7.450)
pO2, Arterial: 80.4 mmHg — ABNORMAL LOW (ref 83.0–108.0)

## 2021-09-08 LAB — PROTIME-INR
INR: 2.7 — ABNORMAL HIGH (ref 0.8–1.2)
Prothrombin Time: 28.4 seconds — ABNORMAL HIGH (ref 11.4–15.2)

## 2021-09-08 LAB — TRIGLYCERIDES: Triglycerides: 241 mg/dL — ABNORMAL HIGH (ref <150)

## 2021-09-08 NOTE — Progress Notes (Signed)
Pulmonary Critical Care Medicine [place]   PULMONARY CRITICAL CARE SERVICE  PROGRESS NOTE     Matthew Dominguez  GDJ:[id number]  DOB: [birth year]   DOA: 09/05/2021  Referring Physician: Satira Sark, MD  HPI: Matthew Dominguez is a 71 y.o. male being followed for ventilator/airway/oxygen weaning Acute on Chronic Respiratory Failure.  Patient is on pressure support has been on 50% FiO2 has done very well over the weekend has been on pressure support today is ready to do a 5/5 preextubation trial  Medications: Reviewed on Rounds  Physical Exam:  Vitals: Temperature is 98.0 pulse 97 respiratory 27 blood pressure is 136/68 saturations 100%  Ventilator Settings on pressure support FiO2 50% tidal volume 457 pressure 12/5  General: Comfortable at this time Neck: supple Cardiovascular: no malignant arrhythmias Respiratory: Scattered rhonchi no rales were noted Skin: no rash seen on limited exam Musculoskeletal: No gross abnormality Psychiatric:unable to assess Neurologic:no involuntary movements         Lab Data:   Basic Metabolic Panel: Recent Labs  Lab 09/02/21 0506 09/04/21 0555 09/05/21 0312 09/07/21 0332 09/08/21 0650  NA 137 134* 136 135 135  K 4.3 4.3 4.4 4.4 4.1  CL 101 95* 98 96* 99  CO2 31 32 32 31 31  GLUCOSE 193* 176* 196* 287* 180*  BUN 10 16 15 19 17   CREATININE 0.45* 0.41* 0.43* 0.48* 0.47*  CALCIUM 8.5* 8.8* 9.0 9.2 9.2  MG 1.8 1.8  --   --   --     ABG: Recent Labs  Lab 09/05/21 0935  PHART 7.386  PCO2ART 57.9*  PO2ART 79.4*  HCO3 33.9*  O2SAT 95.5    Liver Function Tests: Recent Labs  Lab 09/07/21 0332  AST 31  ALT 23  ALKPHOS 213*  BILITOT 0.5  PROT 6.5  ALBUMIN 2.5*   No results for input(s): LIPASE, AMYLASE in the last 168 hours. No results for input(s): AMMONIA in the last 168 hours.  CBC: Recent Labs  Lab 09/02/21 0506 09/04/21 0555 09/07/21 0332  WBC 7.3 7.4 8.0  HGB 10.1* 10.4* 11.5*  HCT 31.1*  33.1* 34.8*  MCV 95.4 95.7 94.8  PLT 351 330 284    Cardiac Enzymes: No results for input(s): CKTOTAL, CKMB, CKMBINDEX, TROPONINI in the last 168 hours.  BNP (last 3 results) No results for input(s): BNP in the last 8760 hours.  ProBNP (last 3 results) No results for input(s): PROBNP in the last 8760 hours.  Radiological Exams: No results found.  Assessment/Plan Active Problems:   COPD, severe (HCC)   Severe sepsis (HCC)   Acute on chronic respiratory failure with hypoxia (HCC)   Chronic pain syndrome   Acute on chronic respiratory failure with hypoxia patient's going to be switched over to a pressure support 5/5.  Extubation trial we will get a gas.  If extubated will need to go on BiPAP Severe COPD medical management we will continue to monitor closely. Severe sepsis treated resolved hemodynamics are stable Chronic pain syndrome pain management Endotracheally intubated   I have personally seen and evaluated the patient, evaluated laboratory and imaging results, formulated the assessment and plan and placed orders. The Patient requires high complexity decision making with multiple systems involvement.  Rounds were done with the Respiratory Therapy Director and Staff therapists and discussed with nursing staff also.  Allyne Gee, MD [facility] Pulmonary Critical Care Medicine Sleep Medicine

## 2021-09-09 DIAGNOSIS — J9621 Acute and chronic respiratory failure with hypoxia: Secondary | ICD-10-CM | POA: Diagnosis not present

## 2021-09-09 DIAGNOSIS — A419 Sepsis, unspecified organism: Secondary | ICD-10-CM | POA: Diagnosis not present

## 2021-09-09 DIAGNOSIS — G894 Chronic pain syndrome: Secondary | ICD-10-CM | POA: Diagnosis not present

## 2021-09-09 DIAGNOSIS — Z978 Presence of other specified devices: Secondary | ICD-10-CM | POA: Diagnosis not present

## 2021-09-09 DIAGNOSIS — J449 Chronic obstructive pulmonary disease, unspecified: Secondary | ICD-10-CM | POA: Diagnosis not present

## 2021-09-09 DIAGNOSIS — R6520 Severe sepsis without septic shock: Secondary | ICD-10-CM | POA: Diagnosis not present

## 2021-09-09 LAB — BLOOD GAS, ARTERIAL
Acid-Base Excess: 7.2 mmol/L — ABNORMAL HIGH (ref 0.0–2.0)
Bicarbonate: 32.3 mmol/L — ABNORMAL HIGH (ref 20.0–28.0)
FIO2: 40
O2 Saturation: 96.1 %
Patient temperature: 37
pCO2 arterial: 55.8 mmHg — ABNORMAL HIGH (ref 32.0–48.0)
pH, Arterial: 7.381 (ref 7.350–7.450)
pO2, Arterial: 85 mmHg (ref 83.0–108.0)

## 2021-09-09 LAB — PROTIME-INR
INR: 2.8 — ABNORMAL HIGH (ref 0.8–1.2)
Prothrombin Time: 29.7 seconds — ABNORMAL HIGH (ref 11.4–15.2)

## 2021-09-09 NOTE — Progress Notes (Signed)
Pulmonary Critical Care Medicine [place]   PULMONARY CRITICAL CARE SERVICE  PROGRESS NOTE     Matthew Dominguez  FTD:[id number]  DOB: May 17, 1951   DOA: 08/12/2021  Referring Physician: Satira Sark, MD  HPI: Matthew KAUFHOLD is a 71 y.o. male being followed for ventilator/airway/oxygen weaning Acute on Chronic Respiratory Failure.  Patient is resting comfortably without distress has been on pressure support doing actually quite well ready for extubation  Medications: Reviewed on Rounds  Physical Exam:  Vitals: Temperature is 97.7 pulse 100 respiratory rate 16 blood pressure is 155/79 saturations 99%  Ventilator Settings on pressure support 5/5  General: Comfortable at this time Neck: supple Cardiovascular: no malignant arrhythmias Respiratory: Scattered rhonchi expansion equal Skin: no rash seen on limited exam Musculoskeletal: No gross abnormality Psychiatric:unable to assess Neurologic:no involuntary movements         Lab Data:   Basic Metabolic Panel: Recent Labs  Lab 09/04/21 0555 09/05/21 0312 09/07/21 0332 09/08/21 0650  NA 134* 136 135 135  K 4.3 4.4 4.4 4.1  CL 95* 98 96* 99  CO2 32 32 31 31  GLUCOSE 176* 196* 287* 180*  BUN 16 15 19 17   CREATININE 0.41* 0.43* 0.48* 0.47*  CALCIUM 8.8* 9.0 9.2 9.2  MG 1.8  --   --   --     ABG: Recent Labs  Lab 09/05/21 0935 09/08/21 0943  PHART 7.386 7.399  PCO2ART 57.9* 51.8*  PO2ART 79.4* 80.4*  HCO3 33.9* 31.5*  O2SAT 95.5 95.9    Liver Function Tests: Recent Labs  Lab 09/07/21 0332  AST 31  ALT 23  ALKPHOS 213*  BILITOT 0.5  PROT 6.5  ALBUMIN 2.5*   No results for input(s): LIPASE, AMYLASE in the last 168 hours. No results for input(s): AMMONIA in the last 168 hours.  CBC: Recent Labs  Lab 09/04/21 0555 09/07/21 0332  WBC 7.4 8.0  HGB 10.4* 11.5*  HCT 33.1* 34.8*  MCV 95.7 94.8  PLT 330 284    Cardiac Enzymes: No results for input(s): CKTOTAL, CKMB, CKMBINDEX,  TROPONINI in the last 168 hours.  BNP (last 3 results) No results for input(s): BNP in the last 8760 hours.  ProBNP (last 3 results) No results for input(s): PROBNP in the last 8760 hours.  Radiological Exams: No results found.  Assessment/Plan Active Problems:   COPD, severe (HCC)   Severe sepsis (HCC)   Acute on chronic respiratory failure with hypoxia (HCC)   Chronic pain syndrome   Acute on chronic respiratory failure with hypoxia patient is doing fine with pressure support 5/5 ready for extubation ABG was done yesterday which actually looked good I think as far as extubation is concerned we will put the patient on BiPAP postextubation Severe COPD medical management nebulizers as needed secretions have improved reportedly Chronic pain syndrome pain management we will continue to follow Severe sepsis resolved hemodynamics are stable Endotracheally intubated will try extubation trial   I have personally seen and evaluated the patient, evaluated laboratory and imaging results, formulated the assessment and plan and placed orders. The Patient requires high complexity decision making with multiple systems involvement.  Rounds were done with the Respiratory Therapy Director and Staff therapists and discussed with nursing staff also.  Allyne Gee, MD [facility] Pulmonary Critical Care Medicine Sleep Medicine

## 2021-09-10 DIAGNOSIS — A419 Sepsis, unspecified organism: Secondary | ICD-10-CM | POA: Diagnosis not present

## 2021-09-10 DIAGNOSIS — J449 Chronic obstructive pulmonary disease, unspecified: Secondary | ICD-10-CM | POA: Diagnosis not present

## 2021-09-10 DIAGNOSIS — J9621 Acute and chronic respiratory failure with hypoxia: Secondary | ICD-10-CM | POA: Diagnosis not present

## 2021-09-10 DIAGNOSIS — R6520 Severe sepsis without septic shock: Secondary | ICD-10-CM | POA: Diagnosis not present

## 2021-09-10 DIAGNOSIS — G894 Chronic pain syndrome: Secondary | ICD-10-CM | POA: Diagnosis not present

## 2021-09-10 LAB — PROTIME-INR
INR: 2.8 — ABNORMAL HIGH (ref 0.8–1.2)
Prothrombin Time: 29.2 seconds — ABNORMAL HIGH (ref 11.4–15.2)

## 2021-09-10 NOTE — Progress Notes (Signed)
Pulmonary Critical Care Medicine [place]   PULMONARY CRITICAL CARE SERVICE  PROGRESS NOTE     Matthew Dominguez  GHW:[id number]  DOB: 1950/12/19   DOA: 08/10/2021  Referring Physician: Satira Sark, MD  HPI: Matthew Dominguez is a 71 y.o. male being followed for ventilator/airway/oxygen weaning Acute on Chronic Respiratory Failure.  Patient was successfully extubated yesterday doing okay now is on 2 L of oxygen basically refused to use the BiPAP overnight  Medications: Reviewed on Rounds  Physical Exam:  Vitals: Temperature is 96.8 pulse 96 respiratory is 12 blood pressure is 167/76 saturations 100%  Ventilator Settings on 2 L oxygen  General: Comfortable at this time Neck: supple Cardiovascular: no malignant arrhythmias Respiratory: No rhonchi very coarse breath sounds Skin: no rash seen on limited exam Musculoskeletal: No gross abnormality Psychiatric:unable to assess Neurologic:no involuntary movements         Lab Data:   Basic Metabolic Panel: Recent Labs  Lab 09/04/21 0555 09/05/21 0312 09/07/21 0332 09/08/21 0650  NA 134* 136 135 135  K 4.3 4.4 4.4 4.1  CL 95* 98 96* 99  CO2 32 32 31 31  GLUCOSE 176* 196* 287* 180*  BUN 16 15 19 17   CREATININE 0.41* 0.43* 0.48* 0.47*  CALCIUM 8.8* 9.0 9.2 9.2  MG 1.8  --   --   --     ABG: Recent Labs  Lab 09/05/21 0935 09/08/21 0943 09/09/21 0941  PHART 7.386 7.399 7.381  PCO2ART 57.9* 51.8* 55.8*  PO2ART 79.4* 80.4* 85.0  HCO3 33.9* 31.5* 32.3*  O2SAT 95.5 95.9 96.1    Liver Function Tests: Recent Labs  Lab 09/07/21 0332  AST 31  ALT 23  ALKPHOS 213*  BILITOT 0.5  PROT 6.5  ALBUMIN 2.5*   No results for input(s): LIPASE, AMYLASE in the last 168 hours. No results for input(s): AMMONIA in the last 168 hours.  CBC: Recent Labs  Lab 09/04/21 0555 09/07/21 0332  WBC 7.4 8.0  HGB 10.4* 11.5*  HCT 33.1* 34.8*  MCV 95.7 94.8  PLT 330 284    Cardiac Enzymes: No results for  input(s): CKTOTAL, CKMB, CKMBINDEX, TROPONINI in the last 168 hours.  BNP (last 3 results) No results for input(s): BNP in the last 8760 hours.  ProBNP (last 3 results) No results for input(s): PROBNP in the last 8760 hours.  Radiological Exams: No results found.  Assessment/Plan Active Problems:   COPD, severe (HCC)   Severe sepsis (HCC)   Acute on chronic respiratory failure with hypoxia (HCC)   Chronic pain syndrome   Acute on chronic respiratory failure with hypoxia patient is on 2 L of oxygen therapy continue with titrating as tolerated Severe COPD medical management nebulizers as needed Chronic pain syndrome pain management supportive care Severe sepsis resolved    I have personally seen and evaluated the patient, evaluated laboratory and imaging results, formulated the assessment and plan and placed orders. The Patient requires high complexity decision making with multiple systems involvement.  Rounds were done with the Respiratory Therapy Director and Staff therapists and discussed with nursing staff also.  Allyne Gee, MD [facility] Pulmonary Critical Care Medicine Sleep Medicine

## 2021-09-11 ENCOUNTER — Other Ambulatory Visit (HOSPITAL_COMMUNITY): Payer: Self-pay

## 2021-09-11 LAB — MAGNESIUM: Magnesium: 1.8 mg/dL (ref 1.7–2.4)

## 2021-09-11 LAB — BASIC METABOLIC PANEL
Anion gap: 7 (ref 5–15)
BUN: 25 mg/dL — ABNORMAL HIGH (ref 8–23)
CO2: 28 mmol/L (ref 22–32)
Calcium: 9.8 mg/dL (ref 8.9–10.3)
Chloride: 97 mmol/L — ABNORMAL LOW (ref 98–111)
Creatinine, Ser: 0.5 mg/dL — ABNORMAL LOW (ref 0.61–1.24)
GFR, Estimated: >60 mL/min (ref >60)
Glucose, Bld: 245 mg/dL — ABNORMAL HIGH (ref 70–99)
Potassium: 4.3 mmol/L (ref 3.5–5.1)
Sodium: 132 mmol/L — ABNORMAL LOW (ref 135–145)

## 2021-09-11 LAB — CBC
HCT: 38 % — ABNORMAL LOW (ref 39.0–52.0)
Hemoglobin: 12.7 g/dL — ABNORMAL LOW (ref 13.0–17.0)
MCH: 30.7 pg (ref 26.0–34.0)
MCHC: 33.4 g/dL (ref 30.0–36.0)
MCV: 91.8 fL (ref 80.0–100.0)
Platelets: 262 10*3/uL (ref 150–400)
RBC: 4.14 MIL/uL — ABNORMAL LOW (ref 4.22–5.81)
RDW: 14 % (ref 11.5–15.5)
WBC: 6 10*3/uL (ref 4.0–10.5)
nRBC: 0 % (ref 0.0–0.2)

## 2021-09-11 LAB — VIRUS CULTURE

## 2021-09-11 LAB — PROTIME-INR
INR: 3 — ABNORMAL HIGH (ref 0.8–1.2)
Prothrombin Time: 31.2 seconds — ABNORMAL HIGH (ref 11.4–15.2)

## 2021-09-12 ENCOUNTER — Other Ambulatory Visit (HOSPITAL_COMMUNITY): Payer: Self-pay

## 2021-09-12 LAB — PROTIME-INR
INR: 2.9 — ABNORMAL HIGH (ref 0.8–1.2)
Prothrombin Time: 30.2 seconds — ABNORMAL HIGH (ref 11.4–15.2)

## 2021-09-13 ENCOUNTER — Other Ambulatory Visit (HOSPITAL_COMMUNITY): Payer: Self-pay

## 2021-09-13 DIAGNOSIS — K8020 Calculus of gallbladder without cholecystitis without obstruction: Secondary | ICD-10-CM | POA: Diagnosis not present

## 2021-09-13 DIAGNOSIS — Z4682 Encounter for fitting and adjustment of non-vascular catheter: Secondary | ICD-10-CM | POA: Diagnosis not present

## 2021-09-13 DIAGNOSIS — K7460 Unspecified cirrhosis of liver: Secondary | ICD-10-CM | POA: Diagnosis not present

## 2021-09-13 DIAGNOSIS — K766 Portal hypertension: Secondary | ICD-10-CM | POA: Diagnosis not present

## 2021-09-13 LAB — PROTIME-INR
INR: 2.8 — ABNORMAL HIGH (ref 0.8–1.2)
Prothrombin Time: 29.5 seconds — ABNORMAL HIGH (ref 11.4–15.2)

## 2021-09-13 LAB — BASIC METABOLIC PANEL
Anion gap: 6 (ref 5–15)
BUN: 22 mg/dL (ref 8–23)
CO2: 28 mmol/L (ref 22–32)
Calcium: 9.7 mg/dL (ref 8.9–10.3)
Chloride: 102 mmol/L (ref 98–111)
Creatinine, Ser: 0.37 mg/dL — ABNORMAL LOW (ref 0.61–1.24)
GFR, Estimated: >60 mL/min (ref >60)
Glucose, Bld: 162 mg/dL — ABNORMAL HIGH (ref 70–99)
Potassium: 3.9 mmol/L (ref 3.5–5.1)
Sodium: 136 mmol/L (ref 135–145)

## 2021-09-13 MED ORDER — IOHEXOL 350 MG/ML SOLN
100.0000 mL | Freq: Once | INTRAVENOUS | Status: AC | PRN
  Administered 2021-09-13: 100 mL via INTRAVENOUS

## 2021-09-14 LAB — BASIC METABOLIC PANEL
Anion gap: 8 (ref 5–15)
BUN: 23 mg/dL (ref 8–23)
CO2: 28 mmol/L (ref 22–32)
Calcium: 10.1 mg/dL (ref 8.9–10.3)
Chloride: 100 mmol/L (ref 98–111)
Creatinine, Ser: 0.56 mg/dL — ABNORMAL LOW (ref 0.61–1.24)
GFR, Estimated: >60 mL/min (ref >60)
Glucose, Bld: 185 mg/dL — ABNORMAL HIGH (ref 70–99)
Potassium: 4.7 mmol/L (ref 3.5–5.1)
Sodium: 136 mmol/L (ref 135–145)

## 2021-09-14 LAB — CBC
HCT: 41.8 % (ref 39.0–52.0)
Hemoglobin: 13.4 g/dL (ref 13.0–17.0)
MCH: 29.8 pg (ref 26.0–34.0)
MCHC: 32.1 g/dL (ref 30.0–36.0)
MCV: 93.1 fL (ref 80.0–100.0)
Platelets: 257 10*3/uL (ref 150–400)
RBC: 4.49 MIL/uL (ref 4.22–5.81)
RDW: 14 % (ref 11.5–15.5)
WBC: 7.4 10*3/uL (ref 4.0–10.5)
nRBC: 0 % (ref 0.0–0.2)

## 2021-09-14 LAB — PROTIME-INR
INR: 2.4 — ABNORMAL HIGH (ref 0.8–1.2)
Prothrombin Time: 26.4 seconds — ABNORMAL HIGH (ref 11.4–15.2)

## 2021-09-14 LAB — MAGNESIUM: Magnesium: 1.8 mg/dL (ref 1.7–2.4)

## 2021-09-14 NOTE — Progress Notes (Signed)
Referring Physician(s): [facility]  Supervising Physician: Daryll Brod  Patient Status:  [facility]  Chief Complaint:  G tube placement request on 09/13/21 S/p hepatic abscess drain placement on 08/07/21  Subjective:  Pt laying in bed, sleeping. Soft mitten restrains, not a/o.  Allergies: Patient has no known allergies.  Medications: Prior to Admission medications   Medication Sig Start Date End Date Taking? Authorizing Provider  ampicillin-sulbactam (UNASYN 1.5GM) 1.5 (1-0.5) g SOLR injection Inject 1.5 g into the vein every 6 (six) hours. 09/01/2021   Domenic Polite, MD  bisoprolol (ZEBETA) 10 MG tablet Take 1 tablet (10 mg total) by mouth daily. 12/23/20   Lauree Chandler, NP  doxazosin (CARDURA) 1 MG tablet Take 1 tablet (1 mg total) by mouth daily. 08/15/21   Domenic Polite, MD  furosemide (LASIX) 10 MG/ML injection Inject 4 mLs (40 mg total) into the vein 2 (two) times daily. 09/02/2021   Domenic Polite, MD  heparin 25000 UT/250ML infusion Inject 2,450 Units/hr into the vein continuous. 08/12/2021   Domenic Polite, MD  insulin aspart (NOVOLOG) 100 UNIT/ML injection Inject 3 Units into the skin every 6 (six) hours. 09/01/2021   Domenic Polite, MD  metoprolol tartrate (LOPRESSOR) 5 MG/5ML SOLN injection Inject 10 mLs (10 mg total) into the vein every 6 (six) hours. 08/26/2021   Domenic Polite, MD  omeprazole (PRILOSEC) 20 MG capsule Take 20 mg by mouth daily.    [provider]  QUEtiapine (SEROQUEL) 100 MG tablet Place 1 tablet (100 mg total) into feeding tube 2 (two) times daily. 08/09/2021   Domenic Polite, MD  SYMBICORT 80-4.5 MCG/ACT inhaler INHALE 2 PUFFS BY MOUTH TWICE DAILY IN  THE  MORNING  AND  12  HOURS  LATER Patient taking differently: Inhale 2 puffs into the lungs in the morning and at bedtime. 02/28/21   Ngetich, Dinah C, NP  Tiotropium Bromide-Olodaterol (STIOLTO RESPIMAT) 2.5-2.5 MCG/ACT AERS Inhale 2 puffs into the lungs daily. 05/19/19   [name], Tiffany L, DO     Vital  Signs: There were no vitals taken for this visit.  Physical Exam Constitutional:      General: He is not in acute distress.    Appearance: He is ill-appearing.  HENT:     Head: Normocephalic and atraumatic.  Pulmonary:     Effort: Pulmonary effort is normal.  Abdominal:     General: Abdomen is flat.     Palpations: Abdomen is soft.  Skin:    General: Skin is warm and dry.     Coloration: Skin is not jaundiced or pale.     Comments: Positive RUQ drain to a suction bulb. Site is unremarkable with no erythema, edema, tenderness, bleeding or drainage. Suture and stat lock in place. Dressing is clean, dry, and intact. 5 ml of  cloudy dark brown colored fluid noted in the bulb. Drain aspirates and flushes with some resistance.     Imaging: CT ABDOMEN PELVIS W CONTRAST  Result Date: 09/13/2021 CLINICAL DATA:  Hepatic abscess follow-up. Also evaluate anatomy for G-tube placement. EXAM: CT ABDOMEN AND PELVIS WITH CONTRAST TECHNIQUE: Multidetector CT imaging of the abdomen and pelvis was performed using the standard protocol following bolus administration of intravenous contrast. CONTRAST:  159mL OMNIPAQUE IOHEXOL 350 MG/ML SOLN COMPARISON:  CT abdomen dated August 21, 2021. CT abdomen pelvis dated August 12, 2021. FINDINGS: Lower chest: Right lower lobe mucous airways plugging, subsegmental atelectasis, and trace right pleural effusion, improved since prior study. Hepatobiliary: Percutaneous drain in the left liver  remains in place, with interval resolution of the adjacent fluid collection. Unchanged nodular liver contour. Similar distended gallbladder with multiple gallstones. No wall thickening or biliary dilatation. Pancreas: Unremarkable. No pancreatic ductal dilatation or surrounding inflammatory changes. Spleen: Unchanged mild splenomegaly. No focal abnormality. Adrenals/Urinary Tract: Adrenal glands are unremarkable. Kidneys are normal, without renal calculi, focal lesion, or hydronephrosis.  Foley catheter in the bladder. Stomach/Bowel: Enteric tube in the stomach. Safe window for percutaneous access of the stomach in the left upper quadrant. No bowel wall thickening, distention, or surrounding inflammatory changes. Vascular/Lymphatic: Unchanged occlusion of the left portal vein. Aortic atherosclerosis. No enlarged abdominal or pelvic lymph nodes. Reproductive: Unchanged prostatomegaly. Other: Decreased ascites compared to prior study with improved omental fat inflammatory changes and fluid in the upper abdomen. No pneumoperitoneum. Musculoskeletal: No acute or significant osseous findings. IMPRESSION: 1. Safe window for percutaneous access of the stomach in the left upper quadrant. 2. Percutaneous drain in the left liver remains in place, with interval resolution of the hepatic abscess. 3. Improving inflammatory changes and ascites in the upper abdomen. 4. Unchanged cirrhosis with sequelae of portal hypertension. Unchanged occlusion of the left portal vein. 5. Unchanged cholelithiasis. 6. Aortic Atherosclerosis (ICD10-I70.0). Electronically Signed   By: Titus Dubin M.D.   On: 09/13/2021 16:04   DG CHEST PORT 1 VIEW  Result Date: 09/11/2021 CLINICAL DATA:  Pleural effusion. EXAM: PORTABLE CHEST 1 VIEW COMPARISON:  Chest x-ray 09/01/2021. CT abdomen and pelvis 08/19/2021. FINDINGS: Drainage catheter overlies the lower right hemithorax. Enteric tube extends below the diaphragm. There is no evidence for pleural effusion or pneumothorax. There are minimal hazy and patchy opacities in the right lower lung. The cardiomediastinal silhouette is within normal limits for projection. IMPRESSION: 1. Drainage catheter overlies the lower right hemithorax. No pleural effusion identified. 2. Minimal right lower lung atelectasis/airspace disease. Electronically Signed   By: Ronney Asters M.D.   On: 09/11/2021 15:22    Labs:  CBC: Recent Labs    09/04/21 0555 09/07/21 0332 09/11/21 0436 09/14/21 0445   WBC 7.4 8.0 6.0 7.4  HGB 10.4* 11.5* 12.7* 13.4  HCT 33.1* 34.8* 38.0* 41.8  PLT 330 284 262 257    COAGS: Recent Labs    07/25/21 1647 07/30/21 2012 08/05/21 0449 09/11/21 0436 09/12/21 0334 09/13/21 0413 09/14/21 0445  INR 1.3* 1.4*   < > 3.0* 2.9* 2.8* 2.4*  APTT 26 29  --   --   --   --   --    < > = values in this interval not displayed.    BMP: Recent Labs    12/18/20 0953 02/04/21 0959 04/18/21 1353 09/08/21 0650 09/11/21 0436 09/13/21 0413 09/14/21 0445  NA 137 136   < > 135 132* 136 136  K 4.3 4.7   < > 4.1 4.3 3.9 4.7  CL 99 98   < > 99 97* 102 100  CO2 27 28   < > 31 28 28 28   GLUCOSE 367* 474*   < > 180* 245* 162* 185*  BUN 12 12   < > 17 25* 22 23  CALCIUM 9.6 9.9   < > 9.2 9.8 9.7 10.1  CREATININE 0.69* 0.76   < > 0.47* 0.50* 0.37* 0.56*  GFRNONAA 96 91   < > >60 >60 >60 >60  GFRAA 111 106  --   --   --   --   --    < > = values in this interval not displayed.  LIVER FUNCTION TESTS: Recent Labs    08/07/2021 0434 08/15/21 0323 08/24/21 0225 09/07/21 0332  BILITOT 0.5 1.4* 0.6 0.5  AST 31 49* 21 31  ALT 16 15 13 23   ALKPHOS 109 105 128* 213*  PROT 5.9* 5.9* 6.0* 6.5  ALBUMIN 1.7* 1.7* 1.9* 2.5*    Assessment and assessment:   G tube reauest by [facility].  Anatomy amendable, but refrain from G tune placement until hepatic abscess can be removed by Dr. Annamaria Boots. Patient is s/p hepatic abscess drain placement by IR on 08/07/21, lost in f/u after d/c'd to [facility] on 09/05/2021.  CT AP w showed resolution of hepatic fluid collection. OP from the drain has been minimal per RN, cant provide accurate amount.  OP today appeared to be purulent dark brown fluid.  Drain does not aspirate or flush well.  PLAN Follow hepatic drain for couple days, if OP remains  minimal and becomes serosanguinous, may remove the drain.   G tube can be placed by IR but patient has ascites, which will increase G tube related infection risk.  NO G  tube placement can be done  until hepatic drain can be removed per Dr. Annamaria Boots.  Plan on re-evaluate patient for G tube placement after the hepatic drain is removed.    Further treatment plan per [facility] Appreciate and agree with the plan.  IR to follow.    Electronically Signed: Tera Mater, PA-C 09/14/2021, 1:21 PM   I spent a total of 25 Minutes at the the patient's bedside AND on the patient's hospital floor or unit, greater than 50% of which was counseling/coordinating care for hep drain f/u  and G tube placement request.  This chart was dictated using voice recognition software.  Despite best efforts to proofread,  errors can occur which can change the documentation meaning.

## 2021-09-15 LAB — PROTIME-INR
INR: 2.8 — ABNORMAL HIGH (ref 0.8–1.2)
Prothrombin Time: 29.4 seconds — ABNORMAL HIGH (ref 11.4–15.2)

## 2021-09-15 NOTE — Progress Notes (Signed)
Request to IR for G tube placement - patient currently on coumadin for portal vein thrombosis, INR 2.8 today.   Patient also has hepatic abscess drain placed 08/07/21 in IR which has had minimal output since arrival to [facility], CT abd/pelvis w/contrast 09/13/20 shows resolution of hepatic abscess, anatomy amenable to G tube placement.  Discussed history and imaging with Dr. Pascal Lux today who approves drain removal and G tube placement when INR <1.5. Discussed patient with [facility] attending, Dr. Marcille Blanco, who is unsure that a G tube is indicated at this time however he will hold coumadin for drain removal and reassess need for G tube later this week.   Plan: - Hold coumadin until INR < 1.5, start Lovenox/heparin per provider discretion. If G tube is desired Lovenox will need to be held x 24 hours, heparin SQ x 1 dose, heparin gtt x 4 hours prior to procedure. - Repeat INR QD until < 1.5 - Will discuss if G tube is still desired/timing of hepatic drain removal after INR normalized  IR will continue to follow INR. Please call with questions or concerns.  Andreas Blower, PA-C

## 2021-09-16 LAB — HEPATIC FUNCTION PANEL
ALT: 21 U/L (ref 0–44)
AST: 23 U/L (ref 15–41)
Albumin: 3 g/dL — ABNORMAL LOW (ref 3.5–5.0)
Alkaline Phosphatase: 156 U/L — ABNORMAL HIGH (ref 38–126)
Bilirubin, Direct: <0.1 mg/dL (ref 0.0–0.2)
Total Bilirubin: 0.5 mg/dL (ref 0.3–1.2)
Total Protein: 7.1 g/dL (ref 6.5–8.1)

## 2021-09-16 LAB — PROTIME-INR
INR: 2.7 — ABNORMAL HIGH (ref 0.8–1.2)
Prothrombin Time: 28.2 seconds — ABNORMAL HIGH (ref 11.4–15.2)

## 2021-09-16 LAB — AMMONIA: Ammonia: 43 umol/L — ABNORMAL HIGH (ref 9–35)

## 2021-09-17 LAB — PROTIME-INR
INR: 2.2 — ABNORMAL HIGH (ref 0.8–1.2)
Prothrombin Time: 24.3 seconds — ABNORMAL HIGH (ref 11.4–15.2)

## 2021-09-18 ENCOUNTER — Other Ambulatory Visit (HOSPITAL_COMMUNITY): Payer: Self-pay

## 2021-09-18 LAB — PROTIME-INR
INR: 2.2 — ABNORMAL HIGH (ref 0.8–1.2)
Prothrombin Time: 24.3 seconds — ABNORMAL HIGH (ref 11.4–15.2)

## 2021-09-18 LAB — CULTURE, FUNGUS WITHOUT SMEAR

## 2021-09-19 ENCOUNTER — Other Ambulatory Visit (HOSPITAL_COMMUNITY): Payer: Self-pay

## 2021-09-19 LAB — CBC
HCT: 37.7 % — ABNORMAL LOW (ref 39.0–52.0)
Hemoglobin: 12.6 g/dL — ABNORMAL LOW (ref 13.0–17.0)
MCH: 30.4 pg (ref 26.0–34.0)
MCHC: 33.4 g/dL (ref 30.0–36.0)
MCV: 90.8 fL (ref 80.0–100.0)
Platelets: 247 10*3/uL (ref 150–400)
RBC: 4.15 MIL/uL — ABNORMAL LOW (ref 4.22–5.81)
RDW: 13.2 % (ref 11.5–15.5)
WBC: 7.3 10*3/uL (ref 4.0–10.5)
nRBC: 0 % (ref 0.0–0.2)

## 2021-09-19 LAB — BASIC METABOLIC PANEL
Anion gap: 10 (ref 5–15)
BUN: 31 mg/dL — ABNORMAL HIGH (ref 8–23)
CO2: 29 mmol/L (ref 22–32)
Calcium: 10.1 mg/dL (ref 8.9–10.3)
Chloride: 102 mmol/L (ref 98–111)
Creatinine, Ser: 0.55 mg/dL — ABNORMAL LOW (ref 0.61–1.24)
GFR, Estimated: >60 mL/min (ref >60)
Glucose, Bld: 272 mg/dL — ABNORMAL HIGH (ref 70–99)
Potassium: 4.2 mmol/L (ref 3.5–5.1)
Sodium: 141 mmol/L (ref 135–145)

## 2021-09-19 LAB — PROTIME-INR
INR: 2.2 — ABNORMAL HIGH (ref 0.8–1.2)
Prothrombin Time: 24.5 seconds — ABNORMAL HIGH (ref 11.4–15.2)

## 2021-09-19 NOTE — Progress Notes (Signed)
Request for IR for G tube placement and hepatic abscess drain removal. Patient currently on coumadin for portal vein thrombosis. After discussion with Dr. Owens Shark recommends hepatic abscess drain removal when INR < 1.5 . INR today 2.2.  Plan  - Hold coumadin until INR < 1.5, start Lovenox/heparin per provider discretion. If G tube is desired Lovenox will need to be held x 24 hours, heparin SQ x 1 dose, heparin gtt x 4 hours prior to procedure. - Repeat INR QD until < 1.5 - Will discuss if G tube is still desired/timing of hepatic drain removal after INR normalized  IR continue to follow. Please call with questions or concerns.

## 2021-09-20 LAB — BASIC METABOLIC PANEL
Anion gap: 12 (ref 5–15)
Anion gap: 12 (ref 5–15)
BUN: 43 mg/dL — ABNORMAL HIGH (ref 8–23)
BUN: 63 mg/dL — ABNORMAL HIGH (ref 8–23)
CO2: 28 mmol/L (ref 22–32)
CO2: 24 mmol/L (ref 22–32)
Calcium: 9.6 mg/dL (ref 8.9–10.3)
Calcium: 9.7 mg/dL (ref 8.9–10.3)
Chloride: 102 mmol/L (ref 98–111)
Chloride: 105 mmol/L (ref 98–111)
Creatinine, Ser: 1.13 mg/dL (ref 0.61–1.24)
Creatinine, Ser: 1.32 mg/dL — ABNORMAL HIGH (ref 0.61–1.24)
GFR, Estimated: >60 mL/min (ref >60)
GFR, Estimated: 57 mL/min — ABNORMAL LOW (ref >60)
Glucose, Bld: 401 mg/dL — ABNORMAL HIGH (ref 70–99)
Glucose, Bld: 340 mg/dL — ABNORMAL HIGH (ref 70–99)
Potassium: 5.7 mmol/L — ABNORMAL HIGH (ref 3.5–5.1)
Potassium: 5 mmol/L (ref 3.5–5.1)
Sodium: 142 mmol/L (ref 135–145)
Sodium: 141 mmol/L (ref 135–145)

## 2021-09-20 LAB — BLOOD GAS, ARTERIAL
Acid-base deficit: 1 mmol/L (ref 0.0–2.0)
Bicarbonate: 27.8 mmol/L (ref 20.0–28.0)
FIO2: 100
O2 Saturation: 91.5 %
Patient temperature: 36.4
pCO2 arterial: 89.6 mmHg (ref 32.0–48.0)
pH, Arterial: 7.114 — CL (ref 7.350–7.450)
pO2, Arterial: 69.1 mmHg — ABNORMAL LOW (ref 83.0–108.0)

## 2021-09-20 LAB — AMMONIA: Ammonia: 31 umol/L (ref 9–35)

## 2021-09-20 LAB — CBC
HCT: 45.2 % (ref 39.0–52.0)
Hemoglobin: 13.8 g/dL (ref 13.0–17.0)
MCH: 29.2 pg (ref 26.0–34.0)
MCHC: 30.5 g/dL (ref 30.0–36.0)
MCV: 95.8 fL (ref 80.0–100.0)
Platelets: 395 10*3/uL (ref 150–400)
RBC: 4.72 MIL/uL (ref 4.22–5.81)
RDW: 13.4 % (ref 11.5–15.5)
WBC: 13 10*3/uL — ABNORMAL HIGH (ref 4.0–10.5)
nRBC: 0 % (ref 0.0–0.2)

## 2021-09-20 LAB — PROTIME-INR
INR: 3.4 — ABNORMAL HIGH (ref 0.8–1.2)
Prothrombin Time: 34.6 seconds — ABNORMAL HIGH (ref 11.4–15.2)

## 2021-09-20 LAB — VANCOMYCIN, TROUGH: Vancomycin Tr: 19 ug/mL (ref 15–20)

## 2021-09-20 LAB — POTASSIUM: Potassium: 5.1 mmol/L (ref 3.5–5.1)

## 2021-09-21 LAB — CBC
HCT: 39.9 % (ref 39.0–52.0)
Hemoglobin: 12.5 g/dL — ABNORMAL LOW (ref 13.0–17.0)
MCH: 29.4 pg (ref 26.0–34.0)
MCHC: 31.3 g/dL (ref 30.0–36.0)
MCV: 93.9 fL (ref 80.0–100.0)
Platelets: 251 10*3/uL (ref 150–400)
RBC: 4.25 MIL/uL (ref 4.22–5.81)
RDW: 13.4 % (ref 11.5–15.5)
WBC: 7.7 10*3/uL (ref 4.0–10.5)
nRBC: 0 % (ref 0.0–0.2)

## 2021-09-21 LAB — BASIC METABOLIC PANEL
Anion gap: 9 (ref 5–15)
BUN: 64 mg/dL — ABNORMAL HIGH (ref 8–23)
CO2: 27 mmol/L (ref 22–32)
Calcium: 9.8 mg/dL (ref 8.9–10.3)
Chloride: 108 mmol/L (ref 98–111)
Creatinine, Ser: 1.2 mg/dL (ref 0.61–1.24)
GFR, Estimated: >60 mL/min (ref >60)
Glucose, Bld: 287 mg/dL — ABNORMAL HIGH (ref 70–99)
Potassium: 4.6 mmol/L (ref 3.5–5.1)
Sodium: 144 mmol/L (ref 135–145)

## 2021-09-21 LAB — MAGNESIUM: Magnesium: 1.9 mg/dL (ref 1.7–2.4)

## 2021-09-21 LAB — PROTIME-INR
INR: 5.4 (ref 0.8–1.2)
Prothrombin Time: 49.5 seconds — ABNORMAL HIGH (ref 11.4–15.2)

## 2021-09-22 LAB — PROTIME-INR
INR: 6.3 (ref 0.8–1.2)
Prothrombin Time: 55.6 seconds — ABNORMAL HIGH (ref 11.4–15.2)

## 2021-09-23 LAB — CULTURE, RESPIRATORY W GRAM STAIN: Gram Stain: NONE SEEN

## 2021-09-24 ENCOUNTER — Telehealth: Payer: Self-pay | Admitting: *Deleted

## 2021-09-24 NOTE — Telephone Encounter (Signed)
Transition Care Management Unsuccessful Follow-up Telephone Call  Date of discharge and from where:  Deceased 10-19-2021  Attempts:  1st Attempt  Reason for unsuccessful TCM follow-up call:  Unable to leave message Patient Deceased.

## 2021-09-25 LAB — FUNGUS CULTURE WITH STAIN

## 2021-09-25 LAB — FUNGUS CULTURE RESULT

## 2021-09-25 LAB — FUNGAL ORGANISM REFLEX

## 2021-09-26 ENCOUNTER — Other Ambulatory Visit: Payer: Self-pay | Admitting: Nurse Practitioner

## 2021-09-26 ENCOUNTER — Other Ambulatory Visit: Payer: Self-pay | Admitting: Family

## 2021-09-26 DIAGNOSIS — N401 Enlarged prostate with lower urinary tract symptoms: Secondary | ICD-10-CM

## 2021-10-08 DEATH — deceased
